# Patient Record
Sex: Male | Born: 1990 | Race: White | Hispanic: No | Marital: Single | Smoking: Current every day smoker
Health system: Southern US, Community
[De-identification: ages and names within clinical notes are randomized; demographics above are authoritative.]

## PROBLEM LIST (undated history)

## (undated) DIAGNOSIS — F1990 Other psychoactive substance use, unspecified, uncomplicated: Secondary | ICD-10-CM

## (undated) DIAGNOSIS — I82409 Acute embolism and thrombosis of unspecified deep veins of unspecified lower extremity: Secondary | ICD-10-CM

## (undated) HISTORY — DX: Acute embolism and thrombosis of unspecified deep veins of unspecified lower extremity: I82.409

---
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
## 2013-12-04 ENCOUNTER — Emergency Department: Payer: Self-pay | Admitting: Emergency Medicine

## 2014-02-23 ENCOUNTER — Emergency Department: Payer: Self-pay | Admitting: Emergency Medicine

## 2014-03-05 ENCOUNTER — Emergency Department: Payer: Self-pay | Admitting: Emergency Medicine

## 2014-04-19 ENCOUNTER — Encounter (HOSPITAL_COMMUNITY): Payer: Self-pay | Admitting: Emergency Medicine

## 2014-04-19 ENCOUNTER — Emergency Department (HOSPITAL_COMMUNITY)
Admission: EM | Admit: 2014-04-19 | Discharge: 2014-04-19 | Disposition: A | Discharge status: Home / Self Care | Payer: Self-pay | Attending: Emergency Medicine | Admitting: Emergency Medicine

## 2014-04-19 ENCOUNTER — Emergency Department (HOSPITAL_COMMUNITY): Payer: Self-pay

## 2014-04-19 DIAGNOSIS — W132XXA Fall from, out of or through roof, initial encounter: Secondary | ICD-10-CM | POA: Insufficient documentation

## 2014-04-19 DIAGNOSIS — Y93H3 Activity, building and construction: Secondary | ICD-10-CM | POA: Insufficient documentation

## 2014-04-19 DIAGNOSIS — Z880 Allergy status to penicillin: Secondary | ICD-10-CM | POA: Insufficient documentation

## 2014-04-19 DIAGNOSIS — S29002A Unspecified injury of muscle and tendon of back wall of thorax, initial encounter: Secondary | ICD-10-CM | POA: Insufficient documentation

## 2014-04-19 DIAGNOSIS — M549 Dorsalgia, unspecified: Secondary | ICD-10-CM

## 2014-04-19 DIAGNOSIS — Y9261 Building [any] under construction as the place of occurrence of the external cause: Secondary | ICD-10-CM | POA: Insufficient documentation

## 2014-04-19 DIAGNOSIS — Y990 Civilian activity done for income or pay: Secondary | ICD-10-CM | POA: Insufficient documentation

## 2014-04-19 MED ORDER — CYCLOBENZAPRINE HCL 10 MG PO TABS: 10.0000 mg | ORAL_TABLET | Freq: Two times a day (BID) | ORAL | Status: DC | PRN

## 2014-04-19 MED ORDER — DIAZEPAM 5 MG PO TABS
10.0000 mg | ORAL_TABLET | Freq: Once | ORAL | Status: AC
  Administered 2014-04-19: 10 mg via ORAL
  Filled 2014-04-19: qty 2

## 2014-04-19 MED ORDER — OXYCODONE-ACETAMINOPHEN 5-325 MG PO TABS
1.0000 | ORAL_TABLET | Freq: Once | ORAL | Status: AC
Start: 2014-04-19 — End: 2014-04-19
  Administered 2014-04-19: 1 via ORAL
  Filled 2014-04-19: qty 1

## 2014-04-19 MED ORDER — HYDROCODONE-ACETAMINOPHEN 5-325 MG PO TABS: 1.0000 | ORAL_TABLET | Freq: Four times a day (QID) | ORAL | Status: DC | PRN

## 2014-04-19 MED ORDER — NAPROXEN 500 MG PO TABS: 500.0000 mg | ORAL_TABLET | Freq: Two times a day (BID) | ORAL | Status: DC

## 2014-04-19 NOTE — Discharge Instructions (Signed)
Back Pain, Adult Low back pain is very common. About 1 in 5 people have back pain.The cause of low back pain is rarely dangerous. The pain often gets better over time.About half of people with a sudden onset of back pain feel better in just 2 weeks. About 8 in 10 people feel better by 6 weeks.  CAUSES Some common causes of back pain include:  Strain of the muscles or ligaments supporting the spine.  Wear and tear (degeneration) of the spinal discs.  Arthritis.  Direct injury to the back. DIAGNOSIS Most of the time, the direct cause of low back pain is not known.However, back pain can be treated effectively even when the exact cause of the pain is unknown.Answering your caregiver's questions about your overall health and symptoms is one of the most accurate ways to make sure the cause of your pain is not dangerous. If your caregiver needs more information, he or she may order lab work or imaging tests (X-rays or MRIs).However, even if imaging tests show changes in your back, this usually does not require surgery. HOME CARE INSTRUCTIONS For many people, back pain returns.Since low back pain is rarely dangerous, it is often a condition that people can learn to manageon their own.   Remain active. It is stressful on the back to sit or stand in one place. Do not sit, drive, or stand in one place for more than 30 minutes at a time. Take short walks on level surfaces as soon as pain allows.Try to increase the length of time you walk each day.  Do not stay in bed.Resting more than 1 or 2 days can delay your recovery.  Do not avoid exercise or work.Your body is made to move.It is not dangerous to be active, even though your back may hurt.Your back will likely heal faster if you return to being active before your pain is gone.  Pay attention to your body when you bend and lift. Many people have less discomfortwhen lifting if they bend their knees, keep the load close to their bodies,and  avoid twisting. Often, the most comfortable positions are those that put less stress on your recovering back.  Find a comfortable position to sleep. Use a firm mattress and lie on your side with your knees slightly bent. If you lie on your back, put a pillow under your knees.  Only take over-the-counter or prescription medicines as directed by your caregiver. Over-the-counter medicines to reduce pain and inflammation are often the most helpful.Your caregiver may prescribe muscle relaxant drugs.These medicines help dull your pain so you can more quickly return to your normal activities and healthy exercise.  Put ice on the injured area.  Put ice in a plastic bag.  Place a towel between your skin and the bag.  Leave the ice on for 15-20 minutes, 03-04 times a day for the first 2 to 3 days. After that, ice and heat may be alternated to reduce pain and spasms.  Ask your caregiver about trying back exercises and gentle massage. This may be of some benefit.  Avoid feeling anxious or stressed.Stress increases muscle tension and can worsen back pain.It is important to recognize when you are anxious or stressed and learn ways to manage it.Exercise is a great option. SEEK MEDICAL CARE IF:  You have pain that is not relieved with rest or medicine.  You have pain that does not improve in 1 week.  You have new symptoms.  You are generally not feeling well. SEEK   IMMEDIATE MEDICAL CARE IF:   You have pain that radiates from your back into your legs.  You develop new bowel or bladder control problems.  You have unusual weakness or numbness in your arms or legs.  You develop nausea or vomiting.  You develop abdominal pain.  You feel faint. Document Released: 06/16/2005 Document Revised: 12/16/2011 Document Reviewed: 10/18/2013 Exit[employer] Patient Information 2015 [employer]. This information is not intended to replace advice given to you by your health care provider. Make sure you  discuss any questions you have with your health care provider.  

## 2014-04-19 NOTE — ED Provider Notes (Signed)
CSN: [id number]     Arrival date & time 04/19/14  0021 History   First MD Initiated Contact with Patient 04/19/14 0204     Chief Complaint  Patient presents with  . Back Pain     (Consider location/radiation/quality/duration/timing/severity/associated sxs/prior Treatment) Patient is a 23 y.o. male presenting with back pain. The history is provided by the patient. No language interpreter was used.  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  No loss of consciousness. Did not strike any object during fall.  History reviewed. No pertinent past medical history. History reviewed. No pertinent past surgical history. No family history on file. History  Substance Use Topics  . Smoking status: Never Smoker   . Smokeless tobacco: Not on file  . Alcohol Use: Yes    Review of Systems  Gastrointestinal: Negative for bowel incontinence.  Genitourinary: Negative for bladder incontinence.  Musculoskeletal: Positive for back pain.  Neurological: Negative for numbness and paresthesias.  All other systems reviewed and are negative.     Allergies  Penicillins  Home Medications   Prior to Admission medications   Medication Sig Start Date End Date Taking? Authorizing Provider  Aspirin-Salicylamide-Caffeine (BC HEADACHE POWDER PO) Take 2 Packages by mouth daily as needed (pain).   Yes Historical Provider, MD  traMADol (ULTRAM) 50 MG tablet Take 150 mg by mouth once.   Yes Historical Provider, MD   BP 141/83  Pulse 92  Resp 16  Ht 6' (1.829 m)  Wt 175 lb (79.379 kg)  BMI 23.73  kg/m2  SpO2 100% Physical Exam  Nursing note and vitals reviewed. Constitutional: He is oriented to person, place, and time. He appears well-developed and well-nourished.  HENT:  Head: Normocephalic and atraumatic.  Eyes: Pupils are equal, round, and reactive to light.  Neck: Neck supple.  Cardiovascular: Normal rate, regular rhythm and intact distal pulses.   Pulmonary/Chest: Effort normal and breath sounds normal.  Abdominal: Soft.  Musculoskeletal: He exhibits tenderness.       Back:  Lymphadenopathy:    He has no cervical adenopathy.  Neurological: He is alert and oriented to person, place, and time.  Skin: Skin is warm and dry.  Psychiatric: He has a normal mood and affect.    ED Course  Procedures (including critical care time) Labs Review Labs Reviewed - No data to display  Imaging Review Dg Thoracic Spine W/swimmers  04/19/2014   CLINICAL DATA:  23 year old male status post fall from roof with acute back pain radiating to the lower extremities. Initial encounter.  EXAM: THORACIC SPINE - 2 VIEW + SWIMMERS  COMPARISON:  None.  FINDINGS: Bone mineralization is within normal limits. Dextro convex thoracic scoliosis. Thoracic segmentation appears normal. Motion artifact on the lateral view. Intermittent chronic degenerative endplate changes in the thoracic spine. Posterior ribs appear intact. Cervicothoracic junction alignment is within normal limits. Grossly negative visualized thoracic visceral contours.  IMPRESSION: Scoliosis. No acute fracture or listhesis identified in the thoracic spine.   Electronically Signed   By: Augusto GambleLee  Hall M.D.   On: 04/19/2014 01:52   Dg Lumbar Spine Complete  04/19/2014  CLINICAL DATA:  23 year old male status post fall from roof with acute back pain radiating to the lower extremities. Initial encounter.  EXAM: LUMBAR SPINE - COMPLETE 4+ VIEW  COMPARISON:  Thoracic spine series from the same day reported separately.  FINDINGS: Normal thoracic  segmentation depicted on comparison. Normal lumbar segmentation. Mild levoconvex lumbar scoliosis. Bone mineralization is within normal limits. Vertebral body height preserved. Disc spaces preserved. No pars fracture. Sacral ala and SI joints within normal limits. Visible lower thoracic levels appear intact.  IMPRESSION: Scoliosis. No acute fracture or listhesis identified in the lumbar spine.   Electronically Signed   By: Augusto GambleLee  Hall M.D.   On: 04/19/2014 01:53     EKG Interpretation None     Radiology results reviewed and shared with patient. No red flag symptoms. Muscle spasm and tenderness lower thoracic and upper lumbar areas.  Anti-inflammatory, muscle relaxant. Return precautions discussed. MDM   Final diagnoses:  Back pain        Jimmye Normanavid John Swayzie Choate, NP 04/19/14 [phone]

## 2014-04-19 NOTE — ED Notes (Addendum)
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  Pt denies loss of bowel or bladder, ambulatory in triage without difficulty.  Denies numbness in lower extremities.  Pt reports taking Tramadol without relief prior to arrival.

## 2014-04-19 NOTE — ED Notes (Signed)
Pt reporting pain to mid to lower back after slipping from a roof.  Pt sts harness jerked his body.  Tenderness palpated to thoracic and lumber spine.

## 2014-04-19 NOTE — ED Provider Notes (Signed)
Medical screening examination/treatment/procedure(s) were performed by non-physician practitioner and as supervising physician I was immediately available for consultation/collaboration.   EKG Interpretation None        Tomasita CrumbleAdeleke Jeorge Reister, MD 04/19/14 1534

## 2014-07-28 ENCOUNTER — Emergency Department: Payer: Self-pay | Admitting: Emergency Medicine

## 2014-10-30 ENCOUNTER — Encounter: Payer: Self-pay | Admitting: *Deleted

## 2014-10-30 ENCOUNTER — Emergency Department
Admission: EM | Admit: 2014-10-30 | Discharge: 2014-10-30 | Disposition: A | Discharge status: Home / Self Care | Payer: Self-pay | Attending: Emergency Medicine | Admitting: Emergency Medicine

## 2014-10-30 DIAGNOSIS — W450XXA Nail entering through skin, initial encounter: Secondary | ICD-10-CM | POA: Insufficient documentation

## 2014-10-30 DIAGNOSIS — Z791 Long term (current) use of non-steroidal anti-inflammatories (NSAID): Secondary | ICD-10-CM | POA: Insufficient documentation

## 2014-10-30 DIAGNOSIS — S91331A Puncture wound without foreign body, right foot, initial encounter: Secondary | ICD-10-CM | POA: Insufficient documentation

## 2014-10-30 DIAGNOSIS — Z880 Allergy status to penicillin: Secondary | ICD-10-CM | POA: Insufficient documentation

## 2014-10-30 DIAGNOSIS — Y9289 Other specified places as the place of occurrence of the external cause: Secondary | ICD-10-CM | POA: Insufficient documentation

## 2014-10-30 DIAGNOSIS — Z792 Long term (current) use of antibiotics: Secondary | ICD-10-CM | POA: Insufficient documentation

## 2014-10-30 DIAGNOSIS — Y9389 Activity, other specified: Secondary | ICD-10-CM | POA: Insufficient documentation

## 2014-10-30 DIAGNOSIS — Y990 Civilian activity done for income or pay: Secondary | ICD-10-CM | POA: Insufficient documentation

## 2014-10-30 DIAGNOSIS — Z79899 Other long term (current) drug therapy: Secondary | ICD-10-CM | POA: Insufficient documentation

## 2014-10-30 MED ORDER — OXYCODONE-ACETAMINOPHEN 5-325 MG PO TABS: 1.0000 | ORAL_TABLET | Freq: Four times a day (QID) | ORAL | Status: AC | PRN

## 2014-10-30 MED ORDER — CIPROFLOXACIN HCL 500 MG PO TABS
ORAL_TABLET | ORAL | Status: AC
  Filled 2014-10-30: qty 1

## 2014-10-30 MED ORDER — OXYCODONE-ACETAMINOPHEN 5-325 MG PO TABS
ORAL_TABLET | ORAL | Status: AC
  Filled 2014-10-30: qty 1

## 2014-10-30 MED ORDER — CIPROFLOXACIN HCL 500 MG PO TABS: 500.0000 mg | ORAL_TABLET | Freq: Two times a day (BID) | ORAL | Status: AC

## 2014-10-30 MED ORDER — CIPROFLOXACIN HCL 500 MG PO TABS
500.0000 mg | ORAL_TABLET | Freq: Once | ORAL | Status: AC
  Administered 2014-10-30: 500 mg via ORAL

## 2014-10-30 MED ORDER — OXYCODONE-ACETAMINOPHEN 5-325 MG PO TABS
1.0000 | ORAL_TABLET | Freq: Once | ORAL | Status: AC
  Administered 2014-10-30: 1 via ORAL

## 2014-10-30 NOTE — ED Notes (Signed)
Pt states he was at work today and stepped on a nail that went through his shoe on rt foot. Pt states he lost his balance and fell to the ground and also has back pain. Pt states he was soaking in epson salt at home but pain is severe and he had to come get it checked out

## 2014-10-30 NOTE — ED Notes (Signed)
Pt arrived to the ED for complaints of right foot pain and back pain. Pt states that today he stepped on a nail and it almost went all the way through; and when that happened he twisted his body and his back also started to hurt. Pt's sensation, circulation and range of motion are intact on affected limb. Skin around the laceration is red and whipping. Pt is AOx4 in mild pain distress.

## 2014-10-30 NOTE — ED Provider Notes (Signed)
[facility] Emergency Department Provider Note    ____________________________________________  Time seen: 2250 hrs.  I have reviewed the triage vital signs and the nursing notes.   HISTORY  Chief Complaint Foot Pain       HPI Nathaniel Lynn is a 24 y.o. male ) the right dorsal foot pain secondary to a nail punctured through his shoe while at work today. He states that he was [place] tears on when he stepped on a nail which went through his shoe patient stated pain to lose his balance and fall landing on his buttocks. Patient stated upon removing the nail there was moderate hemorrhaging which was controlled with direct pressure. Patient went home and said he soaked his foot in Epsom salt however the pain is increasingin swelling to his feet. Patient states secondary is atypical K his back pain when he fell has become worse. Patient describes pain as sharp and a 9/10. The patient stated he has problems weightbearing seems increases back pain. Patient denies any radicular pain from his back he also denies any bladder or bowel dysfunction.    History reviewed. No pertinent past medical history.  There are no active problems to display for this patient.   History reviewed. No pertinent past surgical history.  Current Outpatient Rx  Name  Route  Sig  Dispense  Refill  . Aspirin-Salicylamide-Caffeine (BC HEADACHE POWDER PO)   Oral   Take 2 Packages by mouth daily as needed (pain).         . ciprofloxacin (CIPRO) 500 MG tablet   Oral   Take 1 tablet (500 mg total) by mouth 2 (two) times daily.   20 tablet   0   . cyclobenzaprine (FLEXERIL) 10 MG tablet   Oral   Take 1 tablet (10 mg total) by mouth 2 (two) times daily as needed for muscle spasms.   20 tablet   0   . HYDROcodone-acetaminophen (NORCO/VICODIN) 5-325 MG per tablet   Oral   Take 1 tablet by mouth every 6 (six) hours as needed.   10 tablet   0   . naproxen (NAPROSYN) 500 MG  tablet   Oral   Take 1 tablet (500 mg total) by mouth 2 (two) times daily.   20 tablet   0   . oxyCODONE-acetaminophen (ROXICET) 5-325 MG per tablet   Oral   Take 1 tablet by mouth every 6 (six) hours as needed for moderate pain.   12 tablet   0   . traMADol (ULTRAM) 50 MG tablet   Oral   Take 150 mg by mouth once.           Allergies Penicillins  No family history on file.  Social History History  Substance Use Topics  . Smoking status: Never Smoker   . Smokeless tobacco: Not on file  . Alcohol Use: Yes    Review of Systems  Constitutional: Negative for fever. Eyes: Negative for visual changes. ENT: Negative for sore throat. Cardiovascular: Negative for chest pain. Respiratory: Negative for shortness of breath. Gastrointestinal: Negative for abdominal pain, vomiting and diarrhea. Genitourinary: Negative for dysuria. Musculoskeletal: Positive for back pain. Right plantar pain. Skin: Negative for rash. Neurological: Negative for headaches, focal weakness or numbness. Psychiatric:Negative Endocrine:Negative Hematological/Lymphatic:Negative Allergic/Immunilogical: Negative  10-point ROS otherwise negative.  ____________________________________________   PHYSICAL EXAM:  VITAL SIGNS: ED Triage Vitals  Enc Vitals Group     BP 10/30/14 2106 139/70 mmHg     Pulse Rate 10/30/14 2106  70     Resp 10/30/14 2106 16     Temp 10/30/14 2106 98.9 F (37.2 C)     Temp Source 10/30/14 2106 Oral     SpO2 10/30/14 2106 99 %     Weight 10/30/14 2106 175 lb (79.379 kg)     Height 10/30/14 2106 6' (1.829 m)     Head Cir --      Peak Flow --      Pain Score 10/30/14 2107 9     Pain Loc --      Pain Edu? --      Excl. in GC? --      Constitutional: Alert and oriented. Well appearing and in no distress. Eyes: Conjunctivae are normal. PERRL. Normal extraocular movements. ENT   Head: Normocephalic and atraumatic.   Nose: No congestion/rhinnorhea.    Mouth/Throat: Mucous membranes are moist.   Neck: No stridor. Hematological/Lymphatic/Immunilogical: No cervical lymphadenopathy. Cardiovascular: Normal rate, regular rhythm. Normal and symmetric distal pulses are present in all extremities. No murmurs, rubs, or gallops. Respiratory: Normal respiratory effort without tachypnea nor retractions. Breath sounds are clear and equal bilaterally. No wheezes/rales/rhonchi. Gastrointestinal: Soft and nontender. No distention. No abdominal bruits. There is no CVA tenderness. Genitourinary: Not examined Musculoskeletal: No spinal deformity.  . No joint effusions.  Edema is noted to thedorsal  aspect of the right foot  Neurologic:  Normal speech and language. No gross focal neurologic deficits are appreciated. Speech is normal. No gait instability. Skin:  Skin is warm, dry and small puncture wound to the plantar aspect of the right foot No rash noted. Psychiatric: Mood and affect are normal. Speech and behavior are normal. Patient exhibits appropriate insight and judgment.  ____________________________________________    LABS (pertinent positives/negatives)  None  ____________________________________________   EKG  None  ____________________________________________    RADIOLOGY  None  ____________________________________________   PROCEDURES  Procedure(s) performed: None  Critical Care performed: No  ____________________________________________   INITIAL IMPRESSION / ASSESSMENT AND PLAN / ED COURSE  Pertinent labs & imaging results that were available during my care of the patient were reviewed by me and considered in my medical decision making (see chart for details).  Puncture wound right foot. Back pain  ____________________________________________   FINAL CLINICAL IMPRESSION(S) / ED DIAGNOSES  Final diagnoses:  Puncture wound of foot, right, initial encounter     Joni ReiningRonald K Smith, PA-C 10/30/14 2318  Loleta Roseory  Forbach, MD 10/31/14 [phone]

## 2014-12-11 ENCOUNTER — Emergency Department: Payer: Self-pay

## 2014-12-11 ENCOUNTER — Emergency Department
Admission: EM | Admit: 2014-12-11 | Discharge: 2014-12-11 | Disposition: A | Discharge status: Home / Self Care | Payer: Self-pay | Attending: Emergency Medicine | Admitting: Emergency Medicine

## 2014-12-11 ENCOUNTER — Encounter: Payer: Self-pay | Admitting: Emergency Medicine

## 2014-12-11 DIAGNOSIS — T148 Other injury of unspecified body region: Secondary | ICD-10-CM | POA: Insufficient documentation

## 2014-12-11 DIAGNOSIS — S6991XA Unspecified injury of right wrist, hand and finger(s), initial encounter: Secondary | ICD-10-CM | POA: Insufficient documentation

## 2014-12-11 DIAGNOSIS — Z791 Long term (current) use of non-steroidal anti-inflammatories (NSAID): Secondary | ICD-10-CM | POA: Insufficient documentation

## 2014-12-11 DIAGNOSIS — M79641 Pain in right hand: Secondary | ICD-10-CM

## 2014-12-11 DIAGNOSIS — Y9289 Other specified places as the place of occurrence of the external cause: Secondary | ICD-10-CM | POA: Insufficient documentation

## 2014-12-11 DIAGNOSIS — T148XXA Other injury of unspecified body region, initial encounter: Secondary | ICD-10-CM

## 2014-12-11 DIAGNOSIS — Z880 Allergy status to penicillin: Secondary | ICD-10-CM | POA: Insufficient documentation

## 2014-12-11 DIAGNOSIS — S6992XA Unspecified injury of left wrist, hand and finger(s), initial encounter: Secondary | ICD-10-CM | POA: Insufficient documentation

## 2014-12-11 DIAGNOSIS — Y998 Other external cause status: Secondary | ICD-10-CM | POA: Insufficient documentation

## 2014-12-11 DIAGNOSIS — Y9389 Activity, other specified: Secondary | ICD-10-CM | POA: Insufficient documentation

## 2014-12-11 DIAGNOSIS — Z720 Tobacco use: Secondary | ICD-10-CM | POA: Insufficient documentation

## 2014-12-11 DIAGNOSIS — W2209XA Striking against other stationary object, initial encounter: Secondary | ICD-10-CM | POA: Insufficient documentation

## 2014-12-11 MED ORDER — OXYCODONE-ACETAMINOPHEN 5-325 MG PO TABS
ORAL_TABLET | ORAL | Status: AC
  Administered 2014-12-11: 1 via ORAL
  Filled 2014-12-11: qty 1

## 2014-12-11 MED ORDER — OXYCODONE-ACETAMINOPHEN 5-325 MG PO TABS
1.0000 | ORAL_TABLET | Freq: Once | ORAL | Status: AC
  Administered 2014-12-11: 1 via ORAL

## 2014-12-11 MED ORDER — OXYCODONE-ACETAMINOPHEN 5-325 MG PO TABS: 1.0000 | ORAL_TABLET | Freq: Four times a day (QID) | ORAL | Status: AC | PRN

## 2014-12-11 NOTE — ED Notes (Signed)
Ice pack supplied to pt

## 2014-12-11 NOTE — ED Provider Notes (Signed)
[facility] Emergency Department Provider Note  ____________________________________________  Time seen: Approximately  536 AM  I have reviewed the triage vital signs and the nursing notes.   HISTORY  Chief Complaint Hand Pain    HPI JUANANDRES PAYANT is a 24 y.o. male who comes in with right hand pain after punching a wall earlier today. The patient reports that the pain is in his right hand worse in his last 3 knuckles. He reports that he punched a wall at approximately 3:30. The patient reports this pain as a 9 out of 10 in intensity. He reports is worse when he moves it and better when he keeps it still. The patient did place some ice on his wrist and it did help the swelling. He also took some tramadol and BC powder but it did not help with the pain. The patient is here as he is having some significant pain and wanted to evaluate for possible fracture.   History reviewed. No pertinent past medical history.  There are no active problems to display for this patient.   History reviewed. No pertinent past surgical history.  Current Outpatient Rx  Name  Route  Sig  Dispense  Refill  . Aspirin-Salicylamide-Caffeine (BC HEADACHE POWDER PO)   Oral   Take 2 Packages by mouth daily as needed (pain).         . cyclobenzaprine (FLEXERIL) 10 MG tablet   Oral   Take 1 tablet (10 mg total) by mouth 2 (two) times daily as needed for muscle spasms.   20 tablet   0   . HYDROcodone-acetaminophen (NORCO/VICODIN) 5-325 MG per tablet   Oral   Take 1 tablet by mouth every 6 (six) hours as needed.   10 tablet   0   . naproxen (NAPROSYN) 500 MG tablet   Oral   Take 1 tablet (500 mg total) by mouth 2 (two) times daily.   20 tablet   0   . oxyCODONE-acetaminophen (ROXICET) 5-325 MG per tablet   Oral   Take 1 tablet by mouth every 6 (six) hours as needed.   12 tablet   0   . traMADol (ULTRAM) 50 MG tablet   Oral   Take 150 mg by mouth once.            Allergies Penicillins  History reviewed. No pertinent family history.  Social History History  Substance Use Topics  . Smoking status: Current Every Day Smoker  . Smokeless tobacco: Not on file  . Alcohol Use: Yes    Review of Systems Constitutional: No fever/chills Eyes: No visual changes. ENT: No sore throat. Cardiovascular: Denies chest pain. Respiratory: Denies shortness of breath. Gastrointestinal: No abdominal pain.  No nausea, no vomiting.   Genitourinary: Negative for dysuria. Musculoskeletal: Left hand pain. Negative for back pain. Skin: Negative for rash. Neurological: Negative for headaches,   10-point ROS otherwise negative.  ____________________________________________   PHYSICAL EXAM:  VITAL SIGNS: ED Triage Vitals  Enc Vitals Group     BP 12/11/14 0335 112/72 mmHg     Pulse Rate 12/11/14 0335 85     Resp 12/11/14 0335 20     Temp 12/11/14 0335 97.5 F (36.4 C)     Temp Source 12/11/14 0335 Oral     SpO2 12/11/14 0335 98 %     Weight 12/11/14 0335 175 lb (79.379 kg)     Height 12/11/14 0335 6' (1.829 m)     Head Cir --  Peak Flow --      Pain Score 12/11/14 0335 9     Pain Loc --      Pain Edu? --      Excl. in GC? --     Constitutional: Alert and oriented. Well appearing and in moderate distress. Eyes: Conjunctivae are normal. PERRL. EOMI. Head: Atraumatic. Nose: No congestion/rhinnorhea. Mouth/Throat: Mucous membranes are moist.  Oropharynx non-erythematous. Cardiovascular: Normal rate, regular rhythm. Grossly normal heart sounds.  Good peripheral circulation. Respiratory: Normal respiratory effort.  No retractions. Lungs CTAB. Gastrointestinal: Soft and nontender. No distention. Positive bowel sounds Genitourinary: Deferred Musculoskeletal: No lower extremity tenderness nor edema.  No joint effusions. Neurologic:  Normal speech and language. No gross focal neurologic deficits are appreciated.  Skin:  Skin is warm, dry and  intact. No rash noted. Psychiatric: Mood and affect are normal.   ____________________________________________   LABS (all labs ordered are listed, but only abnormal results are displayed)  Labs Reviewed - No data to display ____________________________________________  EKG  None ____________________________________________  RADIOLOGY  Right hand x-ray: Soft tissue swelling, no acute bony abnormalities ____________________________________________   PROCEDURES  Procedure(s) performed: None  Critical Care performed: No  ____________________________________________   INITIAL IMPRESSION / ASSESSMENT AND PLAN / ED COURSE  Pertinent labs & imaging results that were available during my care of the patient were reviewed by me and considered in my medical decision making (see chart for details).  The patient is a 24 year old male who comes in today with some right hand pain after punching a wall. The patient does not have any acute fractures according to x-ray but I was still put the patient in an ulnar gutter splint for comfort give him a dose of Percocet and discharged him home to follow-up with orthopedic surgery.  SPLINT APPLICATION Date/Time: 6:19 AM Authorized by: Lucrezia Europe P Consent: Verbal consent obtained. Risks and benefits: risks, benefits and alternatives were discussed Consent given by: patient Splint applied by: EDtechnician Location details: Right hand  Splint type: Ulnar gutter  Supplies used: Upper glass  Post-procedure: The splinted body part was neurovascularly unchanged following the procedure. Patient tolerance: Patient tolerated the procedure well with no immediate complications.    ____________________________________________   FINAL CLINICAL IMPRESSION(S) / ED DIAGNOSES  Final diagnoses:  Hand pain, left  Abrasion  Contusion      Rebecka Apley, MD 12/11/14 [phone]

## 2014-12-11 NOTE — ED Notes (Signed)
Pt states he punched a wall with right hand. Swelling noted to knuckles, slight abrasion.

## 2014-12-11 NOTE — Discharge Instructions (Signed)
Abrasions An abrasion is a cut or scrape of the skin. Abrasions do not go through all layers of the skin. HOME CARE  If a bandage (dressing) was put on your wound, change it as told by your doctor. If the bandage sticks, soak it off with warm.  Wash the area with water and soap 2 times a day. Rinse off the soap. Pat the area dry with a clean towel.  Put on medicated cream (ointment) as told by your doctor.  Change your bandage right away if it gets wet or dirty.  Only take medicine as told by your doctor.  See your doctor within 24-48 hours to get your wound checked.  Check your wound for redness, puffiness (swelling), or yellowish-white fluid (pus). GET HELP RIGHT AWAY IF:   You have more pain in the wound.  You have redness, swelling, or tenderness around the wound.  You have pus coming from the wound.  You have a fever or lasting symptoms for more than 2-3 days.  You have a fever and your symptoms suddenly get worse.  You have a bad smell coming from the wound or bandage. MAKE SURE YOU:   Understand these instructions.  Will watch your condition.  Will get help right away if you are not doing well or get worse. Document Released: 12/03/2007 Document Revised: 03/10/2012 Document Reviewed: 05/20/2011 [facility] Patient Information 2015 [place], [place]. This information is not intended to replace advice given to you by your health care provider. Make sure you discuss any questions you have with your health care provider.  Contusion A contusion is a deep bruise. Contusions are the result of an injury that caused bleeding under the skin. The contusion may turn blue, purple, or yellow. Minor injuries will give you a painless contusion, but more severe contusions may stay painful and swollen for a few weeks.  CAUSES  A contusion is usually caused by a blow, trauma, or direct force to an area of the body. SYMPTOMS   Swelling and redness of the injured area.  Bruising of the  injured area.  Tenderness and soreness of the injured area.  Pain. DIAGNOSIS  The diagnosis can be made by taking a history and physical exam. An X-ray, CT scan, or MRI may be needed to determine if there were any associated injuries, such as fractures. TREATMENT  Specific treatment will depend on what area of the body was injured. In general, the best treatment for a contusion is resting, icing, elevating, and applying cold compresses to the injured area. Over-the-counter medicines may also be recommended for pain control. Ask your caregiver what the best treatment is for your contusion. HOME CARE INSTRUCTIONS   Put ice on the injured area.  Put ice in a plastic bag.  Place a towel between your skin and the bag.  Leave the ice on for 15-20 minutes, 3-4 times a day, or as directed by your health care provider.  Only take over-the-counter or prescription medicines for pain, discomfort, or fever as directed by your caregiver. Your caregiver may recommend avoiding anti-inflammatory medicines (aspirin, ibuprofen, and naproxen) for 48 hours because these medicines may increase bruising.  Rest the injured area.  If possible, elevate the injured area to reduce swelling. SEEK IMMEDIATE MEDICAL CARE IF:   You have increased bruising or swelling.  You have pain that is getting worse.  Your swelling or pain is not relieved with medicines. MAKE SURE YOU:   Understand these instructions.  Will watch your condition.  Will get help right away if you are not doing well or get worse. Document Released: 03/26/2005 Document Revised: 06/21/2013 Document Reviewed: 04/21/2011 [facility] Patient Information 2015 [place], [place]. This information is not intended to replace advice given to you by your health care provider. Make sure you discuss any questions you have with your health care provider.  Musculoskeletal Pain Musculoskeletal pain is muscle and boney aches and pains. These pains can occur in  any part of the body. Your caregiver may treat you without knowing the cause of the pain. They may treat you if blood or urine tests, X-rays, and other tests were normal.  CAUSES There is often not a definite cause or reason for these pains. These pains may be caused by a type of germ (virus). The discomfort may also come from overuse. Overuse includes working out too hard when your body is not fit. Boney aches also come from weather changes. Bone is sensitive to atmospheric pressure changes. HOME CARE INSTRUCTIONS   Ask when your test results will be ready. Make sure you get your test results.  Only take over-the-counter or prescription medicines for pain, discomfort, or fever as directed by your caregiver. If you were given medications for your condition, do not drive, operate machinery or power tools, or sign legal documents for 24 hours. Do not drink alcohol. Do not take sleeping pills or other medications that may interfere with treatment.  Continue all activities unless the activities cause more pain. When the pain lessens, slowly resume normal activities. Gradually increase the intensity and duration of the activities or exercise.  During periods of severe pain, bed rest may be helpful. Lay or sit in any position that is comfortable.  Putting ice on the injured area.  Put ice in a bag.  Place a towel between your skin and the bag.  Leave the ice on for 15 to 20 minutes, 3 to 4 times a day.  Follow up with your caregiver for continued problems and no reason can be found for the pain. If the pain becomes worse or does not go away, it may be necessary to repeat tests or do additional testing. Your caregiver may need to look further for a possible cause. SEEK IMMEDIATE MEDICAL CARE IF:  You have pain that is getting worse and is not relieved by medications.  You develop chest pain that is associated with shortness or breath, sweating, feeling sick to your stomach (nauseous), or throw up  (vomit).  Your pain becomes localized to the abdomen.  You develop any new symptoms that seem different or that concern you. MAKE SURE YOU:   Understand these instructions.  Will watch your condition.  Will get help right away if you are not doing well or get worse. Document Released: 06/16/2005 Document Revised: 09/08/2011 Document Reviewed: 02/18/2013 [facility] Patient Information 2015 [place], [place]. This information is not intended to replace advice given to you by your health care provider. Make sure you discuss any questions you have with your health care provider.

## 2015-05-01 ENCOUNTER — Emergency Department
Admission: EM | Admit: 2015-05-01 | Discharge: 2015-05-01 | Disposition: A | Discharge status: Home / Self Care | Payer: Self-pay | Attending: Emergency Medicine | Admitting: Emergency Medicine

## 2015-05-01 ENCOUNTER — Emergency Department: Payer: Self-pay

## 2015-05-01 ENCOUNTER — Encounter: Payer: Self-pay | Admitting: Emergency Medicine

## 2015-05-01 DIAGNOSIS — Y998 Other external cause status: Secondary | ICD-10-CM | POA: Insufficient documentation

## 2015-05-01 DIAGNOSIS — Y9389 Activity, other specified: Secondary | ICD-10-CM | POA: Insufficient documentation

## 2015-05-01 DIAGNOSIS — Z880 Allergy status to penicillin: Secondary | ICD-10-CM | POA: Insufficient documentation

## 2015-05-01 DIAGNOSIS — Y9289 Other specified places as the place of occurrence of the external cause: Secondary | ICD-10-CM | POA: Insufficient documentation

## 2015-05-01 DIAGNOSIS — Z791 Long term (current) use of non-steroidal anti-inflammatories (NSAID): Secondary | ICD-10-CM | POA: Insufficient documentation

## 2015-05-01 DIAGNOSIS — Z79899 Other long term (current) drug therapy: Secondary | ICD-10-CM | POA: Insufficient documentation

## 2015-05-01 DIAGNOSIS — S025XXA Fracture of tooth (traumatic), initial encounter for closed fracture: Secondary | ICD-10-CM | POA: Insufficient documentation

## 2015-05-01 DIAGNOSIS — Z720 Tobacco use: Secondary | ICD-10-CM | POA: Insufficient documentation

## 2015-05-01 DIAGNOSIS — W01198A Fall on same level from slipping, tripping and stumbling with subsequent striking against other object, initial encounter: Secondary | ICD-10-CM | POA: Insufficient documentation

## 2015-05-01 DIAGNOSIS — S20221A Contusion of right back wall of thorax, initial encounter: Secondary | ICD-10-CM | POA: Insufficient documentation

## 2015-05-01 LAB — URINALYSIS COMPLETE WITH MICROSCOPIC (ARMC ONLY)
BILIRUBIN URINE: NEGATIVE
Glucose, UA: NEGATIVE mg/dL
HGB URINE DIPSTICK: NEGATIVE
KETONES UR: NEGATIVE mg/dL
LEUKOCYTES UA: NEGATIVE
NITRITE: NEGATIVE
PROTEIN: NEGATIVE mg/dL
RBC / HPF: NONE SEEN RBC/hpf (ref 0–5)
SPECIFIC GRAVITY, URINE: 1.017 (ref 1.005–1.030)
WBC UA: NONE SEEN WBC/hpf (ref 0–5)
pH: 6 (ref 5.0–8.0)

## 2015-05-01 MED ORDER — CLINDAMYCIN HCL 150 MG PO CAPS
450.0000 mg | ORAL_CAPSULE | Freq: Once | ORAL | Status: AC
  Administered 2015-05-01: 450 mg via ORAL
  Filled 2015-05-01: qty 3

## 2015-05-01 MED ORDER — CLINDAMYCIN HCL 150 MG PO CAPS: 450.0000 mg | ORAL_CAPSULE | Freq: Three times a day (TID) | ORAL | Status: AC

## 2015-05-01 MED ORDER — IBUPROFEN 800 MG PO TABS
800.0000 mg | ORAL_TABLET | Freq: Once | ORAL | Status: AC
  Administered 2015-05-01: 800 mg via ORAL
  Filled 2015-05-01: qty 1

## 2015-05-01 NOTE — ED Notes (Signed)
Pt ambulated to treatment room with steady gait. Pt presents to ED with c/o lower back pain since 2 pm today.Pt reports he was carrying a stack of shingles on his back and when he bent over to put them down his foot slipped and he fell onto the roof with the bundles of shingles landing on top of his back. Pt reports pain increases when ambulated. Pt alert and oriented x 4, respirations even and unlabored. NAD noted at this time.

## 2015-05-01 NOTE — ED Notes (Signed)

## 2015-05-01 NOTE — Discharge Instructions (Signed)
Tooth Injuries °Tooth injuries (tooth trauma) include cracked or broken teeth (fractures), teeth that have been moved out of place or dislodged (luxations), and knocked-out teeth (avulsions). °A tooth injury often needs to be treated quickly to save the tooth. However, sometimes it is not possible to save a tooth after an injury, so the tooth may need to be removed (extracted). °CAUSES °Tooth injuries may be caused by any force that is strong enough to chip, break, dislodge, or knock out a tooth. Forces may be due to: °· Sports injuries. °· Falls. °· Accidents. °· Fights. °RISK FACTORS °You may be more likely to injure a tooth if you play a contact sport without using a mouthguard. °SYMPTOMS °A tooth that is forced into the gum may appear dislodged or moved out of position into the tooth socket. A fractured tooth may not be as obvious. Symptoms of a tooth injury include: °· Pain, especially with chewing. °· A loose tooth. °· Bleeding in or around the tooth. °· Swelling or bruising near the tooth. °· Swelling or bruising of the lip over the injured tooth. °· Increased sensitivity to heat and cold. °DIAGNOSIS °A tooth injury can be diagnosed with a medical history and a physical exam. You may also need dental X-rays to check for injuries to the root of the tooth. °TREATMENT °Treatment depends on the type of injury you have and how bad it is. Treatment may need to be done quickly to save your tooth. Possible treatments include: °· Replacing a tooth fragment with a filling, cap, or hard, protective cover (crown). This may be an option for a chip or fracture that does not involve the inside of your tooth (pulp). °· Having a procedure to repair the inside of the tooth (root canal) and then having a crown placed on top. This may be done to treat a tooth fracture that involves the pulp. °· Repositioning a dislodged tooth, then doing a root canal. The root canal usually needs to be done within a few days of the  injury. °· Replacing a knocked-out tooth in the socket, if possible, then doing a root canal a few weeks later. °· Tooth extraction for a fracture that extends below your gumline or splits your tooth completely. °HOME CARE INSTRUCTIONS °· Take medicines only as directed by your dental provider or health care provider. °· Keep all follow-up visits as directed by your dental provider or health care provider. This is important. °· Do not eat or chew on very hard objects. These include ice cubes, pens, pencils, hard candy, and popcorn kernels. °· Do not clench or grind your teeth. Tell your dental provider or health care provider if you grind your teeth while you sleep. °· Apply ice to your mouth near the injured tooth as directed by your dental provider or health care provider. °· Follow instructions about rinsing your mouth with salt water as directed by your dental provider or health care provider. °· Do not use your teeth to open packages. °· Always wear mouth protection when you play contact sports. °SEEK MEDICAL CARE IF: °· You continue to have tooth pain after a tooth injury. °· Your tooth is sensitive to heat and cold. °· You develop swelling near your injured tooth. °· You have a fever. °· You are unable to open your jaw. °· You are drooling and it is getting worse. °  °This information is not intended to replace advice given to you by your health care provider. Make sure you discuss any   questions you have with your health care provider. °  °Document Released: 03/13/2004 Document Revised: 10/31/2014 Document Reviewed: 06/12/2014 °Elsevier Interactive Patient Education ©2016 [employer] ° °

## 2015-05-01 NOTE — ED Provider Notes (Signed)
[facility] Emergency Department Provider Note  ____________________________________________  Time seen: Approximately 810 PM  I have reviewed the triage vital signs and the nursing notes.   HISTORY  Chief Complaint Back Pain    HPI Nathaniel Lynn is a 24 y.o. male without any chronic medical issues who is presenting today with back pain after dropping a heavy load of shingles on his back. He said that he was carrying about 150 pounds worth of shingles on his back while walking up a peak roof when he slipped and fell onto his belly. He says when the shingles hit his back he began to have a lot of pain. He has not had any loss of urine or bowel continence since the event happened at 2 PM this afternoon. However he is complaining of pain to his mid thoracic spine as well as to the left side of his thorax. He denies any difficulty breathing. Denies any weakness or numbness to his lower extremities. He did not hit his head or lose consciousness.The pain is increased with walking or movement of the back.  Also complaining of pain and pus drainage from his lower most posterior molar. He says he has not seen a dentist since he has been about 24 years old.   History reviewed. No pertinent past medical history.  There are no active problems to display for this patient.   History reviewed. No pertinent past surgical history.  Current Outpatient Rx  Name  Route  Sig  Dispense  Refill  . Aspirin-Salicylamide-Caffeine (BC HEADACHE POWDER PO)   Oral   Take 2 Packages by mouth daily as needed (pain).         . cyclobenzaprine (FLEXERIL) 10 MG tablet   Oral   Take 1 tablet (10 mg total) by mouth 2 (two) times daily as needed for muscle spasms.   20 tablet   0   . HYDROcodone-acetaminophen (NORCO/VICODIN) 5-325 MG per tablet   Oral   Take 1 tablet by mouth every 6 (six) hours as needed.   10 tablet   0   . naproxen (NAPROSYN) 500 MG tablet   Oral   Take 1  tablet (500 mg total) by mouth 2 (two) times daily.   20 tablet   0   . oxyCODONE-acetaminophen (ROXICET) 5-325 MG per tablet   Oral   Take 1 tablet by mouth every 6 (six) hours as needed.   12 tablet   0   . traMADol (ULTRAM) 50 MG tablet   Oral   Take 150 mg by mouth once.           Allergies Penicillins  No family history on file.  Social History Social History  Substance Use Topics  . Smoking status: Current Every Day Smoker -- 0.50 packs/day    Types: Cigarettes  . Smokeless tobacco: None  . Alcohol Use: Yes    Review of Systems Constitutional: No fever/chills Eyes: No visual changes. ENT: No sore throat. Cardiovascular: Denies chest pain. Respiratory: Denies shortness of breath. Gastrointestinal: No abdominal pain.  No nausea, no vomiting.  No diarrhea.  No constipation. Genitourinary: Negative for dysuria. Musculoskeletal: As above Skin: Negative for rash. Neurological: Negative for headaches, focal weakness or numbness.  10-point ROS otherwise negative.  ____________________________________________   PHYSICAL EXAM:  VITAL SIGNS: ED Triage Vitals  Enc Vitals Group     BP 05/01/15 1951 145/70 mmHg     Pulse Rate 05/01/15 1951 100     Resp 05/01/15  1951 18     Temp 05/01/15 1951 98 F (36.7 C)     Temp Source 05/01/15 1951 Oral     SpO2 05/01/15 1951 100 %     Weight 05/01/15 1951 175 lb (79.379 kg)     Height 05/01/15 1951 6' (1.829 m)     Head Cir --      Peak Flow --      Pain Score 05/01/15 1951 7     Pain Loc --      Pain Edu? --      Excl. in GC? --     Constitutional: Alert and oriented. Well appearing and in no acute distress. Eyes: Conjunctivae are normal. PERRL. EOMI. Head: Atraumatic. Nose: No congestion/rhinnorhea. Mouth/Throat: Mucous membranes are moist.  Oropharynx non-erythematous. Tooth #32 with a fracture. No surrounding fluctuance or pus drainage. Neck: No stridor.   Cardiovascular: Normal rate, regular rhythm.  Grossly normal heart sounds.  Good peripheral circulation. Respiratory: Normal respiratory effort.  No retractions. Lungs CTAB. Gastrointestinal: Soft and nontender. No distention. No abdominal bruits. No CVA tenderness. Musculoskeletal: No lower extremity tenderness nor edema.  No joint effusions. Patient with tenderness to T9-11 at the midline. There is no deformity, step-off or ecchymosis. He also has left CVA tenderness without any crepitus, deformity or ecchymosis overlying. He has 5 out of 5 strength to the bilateral lower extremities. He did not have any saddle anesthesia. He was able to stand on his tiptoes with full strength and without any issue. Neurologic:  Normal speech and language. No gross focal neurologic deficits are appreciated. No gait instability. Skin:  Skin is warm, dry and intact. No rash noted. Psychiatric: Mood and affect are normal. Speech and behavior are normal.  ____________________________________________   LABS (all labs ordered are listed, but only abnormal results are displayed)  Labs Reviewed  URINALYSIS COMPLETEWITH MICROSCOPIC ([facility] ONLY) - Abnormal; Notable for the following:    Color, Urine YELLOW (*)    APPearance CLEAR (*)    Bacteria, UA RARE (*)    Squamous Epithelial / LPF 0-5 (*)    All other components within normal limits   ____________________________________________  EKG   ____________________________________________  RADIOLOGY  No fracture or spondylolisthesis. Slight scoliosis. ____________________________________________   PROCEDURES   ____________________________________________   INITIAL IMPRESSION / ASSESSMENT AND PLAN / ED COURSE  Pertinent labs & imaging results that were available during my care of the patient were reviewed by me and considered in my medical decision making (see chart for details).  ----------------------------------------- 9:13 PM on 05/01/2015 -----------------------------------------  No  blood found in the urine and there is a reassuring spinal x-ray. Likely the patient has a contusion that he sustained during this fall this afternoon. I reviewed with him his labs as well as imaging results. He will use ice and ibuprofen and will also try icy hot or Aspercreme to the area for the contusion. Unfortunately the patient does have a allergy to penicillin and does not have insurance. I will give him a prescription for clindamycin as well as a in care Rx card and a coupon. I also gave him the list of dental clinics where he may call for follow-up appointments. He'll be discharged to home. ____________________________________________   FINAL CLINICAL IMPRESSION(S) / ED DIAGNOSES  Thoracic contusion. Dental fracture.    Myrna Blazer, MD 05/01/15 2114

## 2015-05-01 NOTE — ED Notes (Addendum)
Pt presents to ED with lower back pain. Pt states he was carrying shingles on a roof while at work. Pt reports when he bent over to put them down his foot slipped out from under him and he fell onto the roof with the bundles of shingles landing on top of his back. Pain increases with walking. Hx of back pain. Pt states he also has an abscessed tooth he would like to have looked at while he is here.

## 2016-03-23 ENCOUNTER — Emergency Department: Payer: Self-pay

## 2016-03-23 ENCOUNTER — Encounter: Payer: Self-pay | Admitting: Emergency Medicine

## 2016-03-23 ENCOUNTER — Emergency Department
Admission: EM | Admit: 2016-03-23 | Discharge: 2016-03-23 | Disposition: A | Discharge status: Home / Self Care | Payer: Self-pay | Attending: Emergency Medicine | Admitting: Emergency Medicine

## 2016-03-23 DIAGNOSIS — F17210 Nicotine dependence, cigarettes, uncomplicated: Secondary | ICD-10-CM | POA: Insufficient documentation

## 2016-03-23 DIAGNOSIS — S20211A Contusion of right front wall of thorax, initial encounter: Secondary | ICD-10-CM | POA: Insufficient documentation

## 2016-03-23 DIAGNOSIS — Y999 Unspecified external cause status: Secondary | ICD-10-CM | POA: Insufficient documentation

## 2016-03-23 DIAGNOSIS — S60222A Contusion of left hand, initial encounter: Secondary | ICD-10-CM | POA: Insufficient documentation

## 2016-03-23 DIAGNOSIS — Y9372 Activity, wrestling: Secondary | ICD-10-CM | POA: Insufficient documentation

## 2016-03-23 DIAGNOSIS — Y9289 Other specified places as the place of occurrence of the external cause: Secondary | ICD-10-CM | POA: Insufficient documentation

## 2016-03-23 MED ORDER — MELOXICAM 15 MG PO TABS: 15.0000 mg | ORAL_TABLET | Freq: Every day | ORAL | 0 refills | Status: DC
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
## 2017-10-04 ENCOUNTER — Encounter: Payer: Self-pay | Admitting: Emergency Medicine

## 2017-10-04 ENCOUNTER — Emergency Department
Admission: EM | Admit: 2017-10-04 | Discharge: 2017-10-04 | Disposition: A | Discharge status: Home / Self Care | Payer: Self-pay | Attending: Emergency Medicine | Admitting: Emergency Medicine

## 2017-10-04 DIAGNOSIS — R300 Dysuria: Secondary | ICD-10-CM | POA: Insufficient documentation

## 2017-10-04 DIAGNOSIS — F17210 Nicotine dependence, cigarettes, uncomplicated: Secondary | ICD-10-CM | POA: Insufficient documentation

## 2017-10-04 DIAGNOSIS — Z79899 Other long term (current) drug therapy: Secondary | ICD-10-CM | POA: Insufficient documentation

## 2017-10-04 DIAGNOSIS — A64 Unspecified sexually transmitted disease: Secondary | ICD-10-CM | POA: Insufficient documentation

## 2017-10-04 LAB — URINALYSIS, COMPLETE (UACMP) WITH MICROSCOPIC
BILIRUBIN URINE: NEGATIVE
Glucose, UA: NEGATIVE mg/dL
Hgb urine dipstick: NEGATIVE
Ketones, ur: 5 mg/dL — AB
Leukocytes, UA: NEGATIVE
Nitrite: NEGATIVE
PH: 7 (ref 5.0–8.0)
Protein, ur: NEGATIVE mg/dL
SPECIFIC GRAVITY, URINE: 1.026 (ref 1.005–1.030)
SQUAMOUS EPITHELIAL / LPF: NONE SEEN

## 2017-10-04 MED ORDER — AZITHROMYCIN 500 MG PO TABS
1000.0000 mg | ORAL_TABLET | Freq: Once | ORAL | Status: AC
  Administered 2017-10-04: 1000 mg via ORAL
  Filled 2017-10-04: qty 2

## 2017-10-04 MED ORDER — CEFTRIAXONE SODIUM 250 MG IJ SOLR
250.0000 mg | Freq: Once | INTRAMUSCULAR | Status: AC
  Administered 2017-10-04: 250 mg via INTRAMUSCULAR
  Filled 2017-10-04: qty 250

## 2017-10-04 NOTE — ED Provider Notes (Signed)
[facility] Emergency Department Provider Note  ____________________________________________  Time seen: Approximately 9:01 PM  I have reviewed the triage vital signs and the nursing notes.   HISTORY  Chief Complaint Urinary Tract Infection    HPI Nathaniel Lynn is a 27 y.o. male who presents emergency department for dysuria and foul-smelling urine times 1 month.  Patient reports that he initially thought he had a UTI as his girlfriend had one.  However patient's symptoms have persisted.  He denies any testicular pain.  No abdominal pain.  No fevers or chills, nausea or vomiting, diarrhea or constipation.  No penile discharge.  No medications prior to arrival.  No complaints at this time.  History reviewed. No pertinent past medical history.  There are no active problems to display for this patient.   History reviewed. No pertinent surgical history.  Prior to Admission medications   Medication Sig Start Date End Date Taking? Authorizing Provider  Aspirin-Salicylamide-Caffeine (BC HEADACHE POWDER PO) Take 2 Packages by mouth daily as needed (pain).    [provider]  meloxicam (MOBIC) 15 MG tablet Take 1 tablet (15 mg total) by mouth daily. 03/23/16   Carsin Randazzo, Delorise RoyalsJonathan D, PA-C    Allergies Penicillins  No family history on file.  Social History Social History   Tobacco Use  . Smoking status: Current Every Day Smoker    Packs/day: 0.50    Types: Cigarettes  . Smokeless tobacco: Never Used  Substance Use Topics  . Alcohol use: Yes  . Drug use: Not Currently     Review of Systems  Constitutional: No fever/chills Eyes: No visual changes. Cardiovascular: no chest pain. Respiratory: no cough. No SOB. Gastrointestinal: No abdominal pain.  No nausea, no vomiting.  No diarrhea.  No constipation. Genitourinary: Positive for dysuria and foul urine smell times 1 month. No hematuria Musculoskeletal: Negative for musculoskeletal  pain. Skin: Negative for rash, abrasions, lacerations, ecchymosis. Neurological: Negative for headaches, focal weakness or numbness. 10-point ROS otherwise negative.  ____________________________________________   PHYSICAL EXAM:  VITAL SIGNS: ED Triage Vitals  Enc Vitals Group     BP 10/04/17 2020 138/74     Pulse Rate 10/04/17 2019 70     Resp 10/04/17 2019 18     Temp 10/04/17 2019 98 F (36.7 C)     Temp src --      SpO2 10/04/17 2019 100 %     Weight 10/04/17 2019 175 lb (79.4 kg)     Height 10/04/17 2019 6' (1.829 m)     Head Circumference --      Peak Flow --      Pain Score 10/04/17 2019 0     Pain Loc --      Pain Edu? --      Excl. in GC? --      Constitutional: Alert and oriented. Well appearing and in no acute distress. Eyes: Conjunctivae are normal. PERRL. EOMI. Head: Atraumatic. Neck: No stridor.    Cardiovascular: Normal rate, regular rhythm. Normal S1 and S2.  Good peripheral circulation. Respiratory: Normal respiratory effort without tachypnea or retractions. Lungs CTAB. Good air entry to the bases with no decreased or absent breath sounds. Gastrointestinal: Bowel sounds 4 quadrants. Soft and nontender to palpation. No guarding or rigidity. No palpable masses. No distention. No CVA tenderness. Musculoskeletal: Full range of motion to all extremities. No gross deformities appreciated. Neurologic:  Normal speech and language. No gross focal neurologic deficits are appreciated.  Skin:  Skin is warm,  dry and intact. No rash noted. Psychiatric: Mood and affect are normal. Speech and behavior are normal. Patient exhibits appropriate insight and judgement.   ____________________________________________   LABS (all labs ordered are listed, but only abnormal results are displayed)  Labs Reviewed  URINALYSIS, COMPLETE (UACMP) WITH MICROSCOPIC - Abnormal; Notable for the following components:      Result Value   Color, Urine AMBER (*)    APPearance CLOUDY  (*)    Ketones, ur 5 (*)    Bacteria, UA RARE (*)    All other components within normal limits  CHLAMYDIA/NGC RT PCR ([facility] ONLY)   ____________________________________________  EKG   ____________________________________________  RADIOLOGY   No results found.  ____________________________________________    PROCEDURES  Procedure(s) performed:    Procedures    Medications  cefTRIAXone (ROCEPHIN) injection 250 mg (has no administration in time range)  azithromycin (ZITHROMAX) tablet 1,000 mg (has no administration in time range)     ____________________________________________   INITIAL IMPRESSION / ASSESSMENT AND PLAN / ED COURSE  Pertinent labs & imaging results that were available during my care of the patient were reviewed by me and considered in my medical decision making (see chart for details).  Review of the  CSRS was performed in accordance of the [facility] prior to dispensing any controlled drugs.     Patient's diagnosis is consistent with dysuria.  Patient presented with dysuria and urine smell changes times 1 month.  Both patient and his girlfriend has similar symptoms.  At this time, suspect the patient has STD/STI.  I have ordered gonorrhea and Chlamydia testing however patient does not want to wait.  I will treat empirically with Rocephin and Zithromax.  Patient is allergic to penicillins as a child but he states it was not a life-threatening reaction.  As such, we will proceed with Rocephin.  Patient is advised to follow-up by calling the emergency department for his results.  If results are positive, he will follow-up with health department in 3 weeks for repeat testing to ensure clearance..  Patient is given ED precautions to return to the ED for any worsening or new symptoms.     ____________________________________________  FINAL CLINICAL IMPRESSION(S) / ED DIAGNOSES  Final diagnoses:  Dysuria  Sexually transmitted infection       NEW MEDICATIONS STARTED DURING THIS VISIT:  ED Discharge Orders    None          This chart was dictated using voice recognition software/Dragon. Despite best efforts to proofread, errors can occur which can change the meaning. Any change was purely unintentional.    Lanette Hampshire 10/04/17 2117    Sharman Cheek, MD 10/04/17 2337

## 2017-10-04 NOTE — ED Triage Notes (Signed)
Patient with complaint of pain with urination and foul smelling urine times one month.

## 2018-02-16 ENCOUNTER — Encounter: Payer: Self-pay | Admitting: Emergency Medicine

## 2018-02-16 ENCOUNTER — Emergency Department
Admission: EM | Admit: 2018-02-16 | Discharge: 2018-02-16 | Disposition: A | Discharge status: Home / Self Care | Payer: Self-pay | Attending: Emergency Medicine | Admitting: Emergency Medicine

## 2018-02-16 DIAGNOSIS — F17210 Nicotine dependence, cigarettes, uncomplicated: Secondary | ICD-10-CM | POA: Insufficient documentation

## 2018-02-16 DIAGNOSIS — B86 Scabies: Secondary | ICD-10-CM | POA: Insufficient documentation

## 2018-02-16 DIAGNOSIS — Z79899 Other long term (current) drug therapy: Secondary | ICD-10-CM | POA: Insufficient documentation

## 2018-02-16 MED ORDER — IVERMECTIN 3 MG PO TABS
200.0000 ug/kg | ORAL_TABLET | Freq: Once | ORAL | Status: AC
  Administered 2018-02-16: 15000 ug via ORAL
  Filled 2018-02-16: qty 5

## 2018-02-16 MED ORDER — IVERMECTIN 3 MG PO TABS: 200.0000 ug/kg | ORAL_TABLET | Freq: Once | ORAL | 0 refills | Status: AC
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
## 2018-02-16 NOTE — ED Notes (Signed)
Pharmacy to send up medication 
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
## 2018-03-03 ENCOUNTER — Emergency Department: Payer: Self-pay

## 2018-03-03 ENCOUNTER — Other Ambulatory Visit: Payer: Self-pay

## 2018-03-03 ENCOUNTER — Emergency Department
Admission: EM | Admit: 2018-03-03 | Discharge: 2018-03-03 | Disposition: A | Discharge status: Home / Self Care | Payer: Self-pay | Attending: Emergency Medicine | Admitting: Emergency Medicine

## 2018-03-03 ENCOUNTER — Encounter: Payer: Self-pay | Admitting: Emergency Medicine

## 2018-03-03 DIAGNOSIS — Y929 Unspecified place or not applicable: Secondary | ICD-10-CM | POA: Insufficient documentation

## 2018-03-03 DIAGNOSIS — Y999 Unspecified external cause status: Secondary | ICD-10-CM | POA: Insufficient documentation

## 2018-03-03 DIAGNOSIS — Y939 Activity, unspecified: Secondary | ICD-10-CM | POA: Insufficient documentation

## 2018-03-03 DIAGNOSIS — F17210 Nicotine dependence, cigarettes, uncomplicated: Secondary | ICD-10-CM | POA: Insufficient documentation

## 2018-03-03 DIAGNOSIS — X58XXXA Exposure to other specified factors, initial encounter: Secondary | ICD-10-CM | POA: Insufficient documentation

## 2018-03-03 DIAGNOSIS — S6991XA Unspecified injury of right wrist, hand and finger(s), initial encounter: Secondary | ICD-10-CM | POA: Insufficient documentation

## 2018-03-03 MED ORDER — MELOXICAM 15 MG PO TABS: 15.0000 mg | ORAL_TABLET | Freq: Every day | ORAL | 0 refills | Status: DC

## 2018-03-03 NOTE — ED Provider Notes (Signed)
[facility] Emergency Department Provider Note  ____________________________________________  Time seen: Approximately 10:37 PM  I have reviewed the triage vital signs and the nursing notes.   HISTORY  Chief Complaint Finger Injury    HPI Nathaniel Lynn is a 27 y.o. male who presents the emergency department complaining of injury to the second digit of the right hand.  Patient reports that he was holding his nephew, was poking his left hand with his right digit when he felt a sudden pain to the index finger.  Patient reports that he looked down, saw hyperextension as well as deformity around the PIP joint.  Patient reports he had immediate pain.  Patient states that he grabbed his finger, felt an abnormality and slightly pulled which resulted in an audible as well as palpable pop.  Patient reports at this time there is no remaining deformity to the PIP joint.  Patient reports he is able to extend and flex the digit but doing so elicits pain.  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  The patient is requesting x-ray to determine whether he can continue working.    History reviewed. No pertinent past medical history.  There are no active problems to display for this patient.   History reviewed. No pertinent surgical history.  Prior to Admission medications   Medication Sig Start Date End Date Taking? Authorizing Provider  Aspirin-Salicylamide-Caffeine (BC HEADACHE POWDER PO) Take 2 Packages by mouth daily as needed (pain).    [provider]  meloxicam (MOBIC) 15 MG tablet Take 1 tablet (15 mg total) by mouth daily. 03/03/18   Cuthriell, Delorise Royals, PA-C    Allergies Penicillins  No family history on file.  Social History Social History   Tobacco Use  . Smoking status: Current Every Day Smoker    Packs/day: 0.50     Types: Cigarettes  . Smokeless tobacco: Never Used  Substance Use Topics  . Alcohol use: Yes  . Drug use: Not Currently     Review of Systems  Constitutional: No fever/chills Eyes: No visual changes.  Cardiovascular: no chest pain. Respiratory: no cough. No SOB. Gastrointestinal: No abdominal pain.  No nausea, no vomiting.   Musculoskeletal: Positive for injury to the second digit of the right hand Skin: Negative for rash, abrasions, lacerations, ecchymosis. Neurological: Negative for headaches, focal weakness or numbness. 10-point ROS otherwise negative.  ____________________________________________   PHYSICAL EXAM:  VITAL SIGNS: ED Triage Vitals [03/03/18 2101]  Enc Vitals Group     BP (!) 139/91     Pulse Rate 70     Resp 18     Temp 98.4 F (36.9 C)     Temp src      SpO2      Weight 170 lb (77.1 kg)     Height 6' (1.829 m)     Head Circumference      Peak Flow      Pain Score 10     Pain Loc      Pain Edu?      Excl. in GC?      Constitutional: Alert and oriented. Well appearing and in no acute distress. Eyes: Conjunctivae are normal. PERRL. EOMI. Head: Atraumatic. Neck: No stridor.    Cardiovascular: Normal rate, regular rhythm. Normal S1 and S2.  Good peripheral circulation. Respiratory: Normal respiratory  effort without tachypnea or retractions. Lungs CTAB. Good air entry to the bases with no decreased or absent breath sounds. Musculoskeletal: Full range of motion to all extremities. No gross deformities appreciated.  Visualization of the index finger right hand reveals mild edema around the PIP joint.  No deformity.  Patient is able to extend and flex the digit appropriately.  Sensation capillary refill intact.  Patient is very tender to palpation over the PIP joint with no palpable abnormality at this time.  No other visible abnormality to the right hand.  No other tenderness to palpation along the right hand. Neurologic:  Normal speech and language.  No gross focal neurologic deficits are appreciated.  Skin:  Skin is warm, dry and intact. No rash noted. Psychiatric: Mood and affect are normal. Speech and behavior are normal. Patient exhibits appropriate insight and judgement.   ____________________________________________   LABS (all labs ordered are listed, but only abnormal results are displayed)  Labs Reviewed - No data to display ____________________________________________  EKG   ____________________________________________  RADIOLOGY I personally viewed and evaluated these images as part of my medical decision making, as well as reviewing the written report by the radiologist.  I concur with radiologist finding of no acute fractures or dislocation to the right index finger.  Edema is appreciated in the soft tissue at the PIP joint.  Dg Finger Index Right  Result Date: 03/03/2018 CLINICAL DATA:  Right index finger pain status post injury prior to arrival. Patient states finger dislocated at the PIP joint and reduced on its own. EXAM: RIGHT INDEX FINGER 2+V COMPARISON:  None. FINDINGS: Dorsal soft tissue swelling at the PIP joint of the right index finger. No acute fracture nor joint dislocation. IMPRESSION: No fracture or malalignment. Soft tissue swelling over the dorsum of the right index finger at the PIP joint level. Electronically Signed   By: Tollie Eth M.D.   On: 03/03/2018 21:35    ____________________________________________    PROCEDURES  Procedure(s) performed:    .Splint Application Date/Time: 03/03/2018 10:58 PM Performed by: Racheal Patches, PA-C Authorized by: Racheal Patches, PA-C   Consent:    Consent obtained:  Verbal   Consent given by:  Patient   Risks discussed:  Pain and swelling Pre-procedure details:    Sensation:  Normal Procedure details:    Location:  Finger   Finger:  R index finger   Splint type:  Finger   Supplies:  Prefabricated splint and elastic  bandage Post-procedure details:    Pain:  Improved   Sensation:  Normal   Patient tolerance of procedure:  Tolerated well, no immediate complications      Medications - No data to display   ____________________________________________   INITIAL IMPRESSION / ASSESSMENT AND PLAN / ED COURSE  Pertinent labs & imaging results that were available during my care of the patient were reviewed by me and considered in my medical decision making (see chart for details).  Review of the  [place] was performed in accordance of the [facility] prior to dispensing any controlled drugs.      Patient's diagnosis is consistent with finger injury.  Patient presents the emergency department with injury to the index finger of his right hand.  Patient verbalized injury, initial findings is consistent with finger dislocation.  Patient reports that he put traction on the digit with manipulation over the PIP joint with a audible, and palpably appreciable reduction of probable dislocation.  No visible deformity at this time.  Good range  of motion with no indication of acute ligamentous rupture.  X-ray reveals no dislocation or fracture.  Finger splinted at this time.  Meloxicam will be provided for symptom relief.  Patient will follow primary care as needed. Patient is given ED precautions to return to the ED for any worsening or new symptoms.     ____________________________________________  FINAL CLINICAL IMPRESSION(S) / ED DIAGNOSES  Final diagnoses:  Injury of finger of right hand, initial encounter      NEW MEDICATIONS STARTED DURING THIS VISIT:  ED Discharge Orders         Ordered    meloxicam (MOBIC) 15 MG tablet  Daily     03/03/18 2254              This chart was dictated using voice recognition software/Dragon. Despite best efforts to proofread, errors can occur which can change the meaning. Any change was purely unintentional.    Racheal Patches, PA-C 03/03/18 2258     Jeanmarie Plant, MD 03/06/18 2113

## 2018-03-03 NOTE — ED Triage Notes (Signed)
Patient ambulatory to triage with steady gait, without difficulty or distress noted; pt reports injuring right index finger PTA; st was pointing finger into palm of his hand and "pushed too hard"

## 2020-11-10 ENCOUNTER — Emergency Department: Payer: Self-pay

## 2020-11-10 ENCOUNTER — Other Ambulatory Visit: Payer: Self-pay

## 2020-11-10 ENCOUNTER — Inpatient Hospital Stay (HOSPITAL_COMMUNITY)
Admission: AD | Admit: 2020-11-10 | Discharge: 2020-12-07 | DRG: 100 | Disposition: A | Discharge status: Rehabilitation Facility | Payer: Self-pay | Source: Other Acute Inpatient Hospital | Attending: Internal Medicine | Admitting: Internal Medicine

## 2020-11-10 ENCOUNTER — Inpatient Hospital Stay (HOSPITAL_COMMUNITY): Payer: Self-pay

## 2020-11-10 ENCOUNTER — Emergency Department
Admission: EM | Admit: 2020-11-10 | Discharge: 2020-11-10 | Disposition: A | Discharge status: Other Acute Inpatient Hospital | Payer: Self-pay | Attending: Emergency Medicine | Admitting: Emergency Medicine

## 2020-11-10 DIAGNOSIS — Z20822 Contact with and (suspected) exposure to covid-19: Secondary | ICD-10-CM | POA: Diagnosis present

## 2020-11-10 DIAGNOSIS — R778 Other specified abnormalities of plasma proteins: Secondary | ICD-10-CM | POA: Insufficient documentation

## 2020-11-10 DIAGNOSIS — E861 Hypovolemia: Secondary | ICD-10-CM

## 2020-11-10 DIAGNOSIS — F1123 Opioid dependence with withdrawal: Secondary | ICD-10-CM | POA: Diagnosis present

## 2020-11-10 DIAGNOSIS — E669 Obesity, unspecified: Secondary | ICD-10-CM | POA: Diagnosis present

## 2020-11-10 DIAGNOSIS — J156 Pneumonia due to other aerobic Gram-negative bacteria: Secondary | ICD-10-CM | POA: Diagnosis not present

## 2020-11-10 DIAGNOSIS — E870 Hyperosmolality and hypernatremia: Secondary | ICD-10-CM

## 2020-11-10 DIAGNOSIS — F1210 Cannabis abuse, uncomplicated: Secondary | ICD-10-CM | POA: Diagnosis present

## 2020-11-10 DIAGNOSIS — R4189 Other symptoms and signs involving cognitive functions and awareness: Secondary | ICD-10-CM

## 2020-11-10 DIAGNOSIS — T50904A Poisoning by unspecified drugs, medicaments and biological substances, undetermined, initial encounter: Secondary | ICD-10-CM

## 2020-11-10 DIAGNOSIS — L8990 Pressure ulcer of unspecified site, unspecified stage: Secondary | ICD-10-CM | POA: Insufficient documentation

## 2020-11-10 DIAGNOSIS — E8770 Fluid overload, unspecified: Secondary | ICD-10-CM | POA: Diagnosis not present

## 2020-11-10 DIAGNOSIS — I82612 Acute embolism and thrombosis of superficial veins of left upper extremity: Secondary | ICD-10-CM | POA: Diagnosis not present

## 2020-11-10 DIAGNOSIS — J690 Pneumonitis due to inhalation of food and vomit: Secondary | ICD-10-CM

## 2020-11-10 DIAGNOSIS — G9340 Encephalopathy, unspecified: Secondary | ICD-10-CM

## 2020-11-10 DIAGNOSIS — W19XXXA Unspecified fall, initial encounter: Secondary | ICD-10-CM | POA: Diagnosis present

## 2020-11-10 DIAGNOSIS — Z781 Physical restraint status: Secondary | ICD-10-CM

## 2020-11-10 DIAGNOSIS — E440 Moderate protein-calorie malnutrition: Secondary | ICD-10-CM | POA: Diagnosis present

## 2020-11-10 DIAGNOSIS — N170 Acute kidney failure with tubular necrosis: Secondary | ICD-10-CM | POA: Diagnosis present

## 2020-11-10 DIAGNOSIS — Z6835 Body mass index (BMI) 35.0-35.9, adult: Secondary | ICD-10-CM

## 2020-11-10 DIAGNOSIS — R6520 Severe sepsis without septic shock: Secondary | ICD-10-CM

## 2020-11-10 DIAGNOSIS — E872 Acidosis: Secondary | ICD-10-CM | POA: Diagnosis present

## 2020-11-10 DIAGNOSIS — R7401 Elevation of levels of liver transaminase levels: Secondary | ICD-10-CM | POA: Diagnosis present

## 2020-11-10 DIAGNOSIS — E876 Hypokalemia: Secondary | ICD-10-CM | POA: Diagnosis present

## 2020-11-10 DIAGNOSIS — J9601 Acute respiratory failure with hypoxia: Secondary | ICD-10-CM

## 2020-11-10 DIAGNOSIS — I248 Other forms of acute ischemic heart disease: Secondary | ICD-10-CM | POA: Diagnosis present

## 2020-11-10 DIAGNOSIS — M6282 Rhabdomyolysis: Secondary | ICD-10-CM | POA: Diagnosis present

## 2020-11-10 DIAGNOSIS — G931 Anoxic brain damage, not elsewhere classified: Secondary | ICD-10-CM | POA: Diagnosis present

## 2020-11-10 DIAGNOSIS — G928 Other toxic encephalopathy: Secondary | ICD-10-CM | POA: Diagnosis present

## 2020-11-10 DIAGNOSIS — S7400XA Injury of sciatic nerve at hip and thigh level, unspecified leg, initial encounter: Secondary | ICD-10-CM | POA: Diagnosis present

## 2020-11-10 DIAGNOSIS — R4020 Unspecified coma: Secondary | ICD-10-CM | POA: Insufficient documentation

## 2020-11-10 DIAGNOSIS — G40901 Epilepsy, unspecified, not intractable, with status epilepticus: Principal | ICD-10-CM | POA: Diagnosis present

## 2020-11-10 DIAGNOSIS — T43621A Poisoning by amphetamines, accidental (unintentional), initial encounter: Secondary | ICD-10-CM | POA: Diagnosis present

## 2020-11-10 DIAGNOSIS — Z880 Allergy status to penicillin: Secondary | ICD-10-CM

## 2020-11-10 DIAGNOSIS — L89893 Pressure ulcer of other site, stage 3: Secondary | ICD-10-CM | POA: Diagnosis not present

## 2020-11-10 DIAGNOSIS — D75839 Thrombocytosis, unspecified: Secondary | ICD-10-CM | POA: Diagnosis present

## 2020-11-10 DIAGNOSIS — M79673 Pain in unspecified foot: Secondary | ICD-10-CM

## 2020-11-10 DIAGNOSIS — D62 Acute posthemorrhagic anemia: Secondary | ICD-10-CM | POA: Diagnosis present

## 2020-11-10 DIAGNOSIS — I6329 Cerebral infarction due to unspecified occlusion or stenosis of other precerebral arteries: Secondary | ICD-10-CM | POA: Diagnosis present

## 2020-11-10 DIAGNOSIS — S0003XA Contusion of scalp, initial encounter: Secondary | ICD-10-CM | POA: Diagnosis present

## 2020-11-10 DIAGNOSIS — R451 Restlessness and agitation: Secondary | ICD-10-CM | POA: Diagnosis not present

## 2020-11-10 DIAGNOSIS — R748 Abnormal levels of other serum enzymes: Secondary | ICD-10-CM | POA: Insufficient documentation

## 2020-11-10 DIAGNOSIS — R7989 Other specified abnormal findings of blood chemistry: Secondary | ICD-10-CM | POA: Insufficient documentation

## 2020-11-10 DIAGNOSIS — J189 Pneumonia, unspecified organism: Secondary | ICD-10-CM

## 2020-11-10 DIAGNOSIS — N1330 Unspecified hydronephrosis: Secondary | ICD-10-CM | POA: Diagnosis present

## 2020-11-10 DIAGNOSIS — L089 Local infection of the skin and subcutaneous tissue, unspecified: Secondary | ICD-10-CM | POA: Diagnosis present

## 2020-11-10 DIAGNOSIS — N179 Acute kidney failure, unspecified: Secondary | ICD-10-CM

## 2020-11-10 DIAGNOSIS — R197 Diarrhea, unspecified: Secondary | ICD-10-CM | POA: Diagnosis not present

## 2020-11-10 DIAGNOSIS — I10 Essential (primary) hypertension: Secondary | ICD-10-CM | POA: Diagnosis present

## 2020-11-10 DIAGNOSIS — F1510 Other stimulant abuse, uncomplicated: Secondary | ICD-10-CM | POA: Diagnosis present

## 2020-11-10 DIAGNOSIS — J988 Other specified respiratory disorders: Secondary | ICD-10-CM

## 2020-11-10 HISTORY — DX: Other psychoactive substance use, unspecified, uncomplicated: F19.90

## 2020-11-10 LAB — COMPREHENSIVE METABOLIC PANEL
ALT: 265 U/L — ABNORMAL HIGH (ref 0–44)
AST: 669 U/L — ABNORMAL HIGH (ref 15–41)
Albumin: 4.8 g/dL (ref 3.5–5.0)
Alkaline Phosphatase: 93 U/L (ref 38–126)
Anion gap: 17 — ABNORMAL HIGH (ref 5–15)
BUN: 24 mg/dL — ABNORMAL HIGH (ref 6–20)
CO2: 23 mmol/L (ref 22–32)
Calcium: 9 mg/dL (ref 8.9–10.3)
Chloride: 97 mmol/L — ABNORMAL LOW (ref 98–111)
Creatinine, Ser: 2.33 mg/dL — ABNORMAL HIGH (ref 0.61–1.24)
GFR, Estimated: 38 mL/min — ABNORMAL LOW (ref >60)
Glucose, Bld: 126 mg/dL — ABNORMAL HIGH (ref 70–99)
Potassium: 4.8 mmol/L (ref 3.5–5.1)
Sodium: 137 mmol/L (ref 135–145)
Total Bilirubin: 1.1 mg/dL (ref 0.3–1.2)
Total Protein: 8.8 g/dL — ABNORMAL HIGH (ref 6.5–8.1)

## 2020-11-10 LAB — CBC WITH DIFFERENTIAL/PLATELET
Abs Immature Granulocytes: 0.1 10*3/uL — ABNORMAL HIGH (ref 0.00–0.07)
Basophils Absolute: 0 10*3/uL (ref 0.0–0.1)
Basophils Relative: 0 %
Eosinophils Absolute: 0 10*3/uL (ref 0.0–0.5)
Eosinophils Relative: 0 %
HCT: 48.1 % (ref 39.0–52.0)
Hemoglobin: 16.6 g/dL (ref 13.0–17.0)
Immature Granulocytes: 1 %
Lymphocytes Relative: 8 %
Lymphs Abs: 1.2 10*3/uL (ref 0.7–4.0)
MCH: 30.9 pg (ref 26.0–34.0)
MCHC: 34.5 g/dL (ref 30.0–36.0)
MCV: 89.6 fL (ref 80.0–100.0)
Monocytes Absolute: 1.9 10*3/uL — ABNORMAL HIGH (ref 0.1–1.0)
Monocytes Relative: 12 %
Neutro Abs: 12.1 10*3/uL — ABNORMAL HIGH (ref 1.7–7.7)
Neutrophils Relative %: 79 %
Platelets: 312 10*3/uL (ref 150–400)
RBC: 5.37 MIL/uL (ref 4.22–5.81)
RDW: 12.6 % (ref 11.5–15.5)
WBC: 15.3 10*3/uL — ABNORMAL HIGH (ref 4.0–10.5)
nRBC: 0 % (ref 0.0–0.2)

## 2020-11-10 LAB — URINE DRUG SCREEN, QUALITATIVE (ARMC ONLY)
Amphetamines, Ur Screen: POSITIVE — AB
Barbiturates, Ur Screen: NOT DETECTED
Benzodiazepine, Ur Scrn: NOT DETECTED
Cannabinoid 50 Ng, Ur ~~LOC~~: POSITIVE — AB
Cocaine Metabolite,Ur ~~LOC~~: NOT DETECTED
MDMA (Ecstasy)Ur Screen: NOT DETECTED
Methadone Scn, Ur: NOT DETECTED
Opiate, Ur Screen: POSITIVE — AB
Phencyclidine (PCP) Ur S: NOT DETECTED
Tricyclic, Ur Screen: NOT DETECTED

## 2020-11-10 LAB — BLOOD GAS, ARTERIAL
Allens test (pass/fail): POSITIVE — AB
Bicarbonate: 21.1 mmol/L (ref 20.0–28.0)
FIO2: 0.35
MECHVT: 500 mL
O2 Saturation: 99.6 %
PEEP: 5 cmH2O
RATE: 16 resp/min
pCO2 arterial: 34 mmHg (ref 32.0–48.0)
pH, Arterial: 7.4 (ref 7.350–7.450)
pO2, Arterial: 178 mmHg — ABNORMAL HIGH (ref 83.0–108.0)

## 2020-11-10 LAB — URINALYSIS, COMPLETE (UACMP) WITH MICROSCOPIC
Bilirubin Urine: NEGATIVE
Glucose, UA: 50 mg/dL — AB
Ketones, ur: 5 mg/dL — AB
Leukocytes,Ua: NEGATIVE
Nitrite: NEGATIVE
Protein, ur: 100 mg/dL — AB
Specific Gravity, Urine: 1.027 (ref 1.005–1.030)
Squamous Epithelial / HPF: NONE SEEN (ref 0–5)
WBC, UA: >50 WBC/hpf — ABNORMAL HIGH (ref 0–5)
pH: 6 (ref 5.0–8.0)

## 2020-11-10 LAB — ETHANOL: Alcohol, Ethyl (B): <10 mg/dL (ref <10)

## 2020-11-10 LAB — CBG MONITORING, ED: Glucose-Capillary: 130 mg/dL — ABNORMAL HIGH (ref 70–99)

## 2020-11-10 LAB — CK: Total CK: >50000 U/L — ABNORMAL HIGH (ref 49–397)

## 2020-11-10 LAB — GLUCOSE, CAPILLARY: Glucose-Capillary: 97 mg/dL (ref 70–99)

## 2020-11-10 LAB — TROPONIN I (HIGH SENSITIVITY)
Troponin I (High Sensitivity): 1848 ng/L (ref <18)
Troponin I (High Sensitivity): 2806 ng/L (ref <18)

## 2020-11-10 LAB — RESP PANEL BY RT-PCR (FLU A&B, COVID) ARPGX2
Influenza A by PCR: NEGATIVE
Influenza B by PCR: NEGATIVE
SARS Coronavirus 2 by RT PCR: NEGATIVE

## 2020-11-10 LAB — LACTIC ACID, PLASMA
Lactic Acid, Venous: 3 mmol/L (ref 0.5–1.9)
Lactic Acid, Venous: 3 mmol/L (ref 0.5–1.9)

## 2020-11-10 LAB — SALICYLATE LEVEL: Salicylate Lvl: <7 mg/dL — ABNORMAL LOW (ref 7.0–30.0)

## 2020-11-10 LAB — ACETAMINOPHEN LEVEL: Acetaminophen (Tylenol), Serum: <10 ug/mL — ABNORMAL LOW (ref 10–30)

## 2020-11-10 MED ORDER — PROPOFOL 1000 MG/100ML IV EMUL
5.0000 ug/kg/min | INTRAVENOUS | Status: DC
  Administered 2020-11-10: 5 ug/kg/min via INTRAVENOUS
  Administered 2020-11-10: 20 ug/kg/min via INTRAVENOUS
  Filled 2020-11-10: qty 100

## 2020-11-10 MED ORDER — POLYVINYL ALCOHOL 1.4 % OP SOLN
1.0000 [drp] | Freq: Three times a day (TID) | OPHTHALMIC | Status: DC
  Administered 2020-11-11 – 2020-12-06 (×52): 1 [drp] via OPHTHALMIC
  Filled 2020-11-10 (×2): qty 15

## 2020-11-10 MED ORDER — THIAMINE HCL 100 MG/ML IJ SOLN
100.0000 mg | Freq: Every day | INTRAMUSCULAR | Status: DC
  Administered 2020-11-11: 100 mg via INTRAVENOUS
  Filled 2020-11-10: qty 2

## 2020-11-10 MED ORDER — DOCUSATE SODIUM 50 MG/5ML PO LIQD
100.0000 mg | Freq: Two times a day (BID) | ORAL | Status: DC
  Administered 2020-11-11 – 2020-11-21 (×9): 100 mg
  Filled 2020-11-10 (×8): qty 10

## 2020-11-10 MED ORDER — PROPOFOL 1000 MG/100ML IV EMUL: 5.0000 ug/kg/min | INTRAVENOUS | Status: DC

## 2020-11-10 MED ORDER — PROPOFOL 1000 MG/100ML IV EMUL
INTRAVENOUS | Status: AC
  Administered 2020-11-10: 5 ug/kg/min via INTRAVENOUS
  Filled 2020-11-10: qty 100

## 2020-11-10 MED ORDER — LORAZEPAM 2 MG/ML IJ SOLN
2.0000 mg | Freq: Once | INTRAMUSCULAR | Status: AC
  Administered 2020-11-10: 2 mg via INTRAVENOUS

## 2020-11-10 MED ORDER — HEPARIN SODIUM (PORCINE) 5000 UNIT/ML IJ SOLN
5000.0000 [IU] | Freq: Three times a day (TID) | INTRAMUSCULAR | Status: DC
  Administered 2020-11-11: 5000 [IU] via SUBCUTANEOUS

## 2020-11-10 MED ORDER — PANTOPRAZOLE SODIUM 40 MG IV SOLR
40.0000 mg | Freq: Every day | INTRAVENOUS | Status: DC
  Administered 2020-11-11 (×2): 40 mg via INTRAVENOUS
  Filled 2020-11-10: qty 40

## 2020-11-10 MED ORDER — LACTATED RINGERS IV BOLUS (SEPSIS)
1000.0000 mL | Freq: Once | INTRAVENOUS | Status: AC
  Administered 2020-11-10: 1000 mL via INTRAVENOUS

## 2020-11-10 MED ORDER — INSULIN ASPART 100 UNIT/ML IJ SOLN
0.0000 [IU] | INTRAMUSCULAR | Status: DC
  Administered 2020-11-11: 2 [IU] via SUBCUTANEOUS
  Administered 2020-11-12 – 2020-11-27 (×11): 1 [IU] via SUBCUTANEOUS

## 2020-11-10 MED ORDER — LACTATED RINGERS IV BOLUS
1000.0000 mL | Freq: Once | INTRAVENOUS | Status: AC
  Administered 2020-11-10: 1000 mL via INTRAVENOUS

## 2020-11-10 MED ORDER — LACTATED RINGERS IV BOLUS (SEPSIS)
500.0000 mL | Freq: Once | INTRAVENOUS | Status: AC
  Administered 2020-11-10: 500 mL via INTRAVENOUS

## 2020-11-10 MED ORDER — LACTATED RINGERS IV SOLN: INTRAVENOUS | Status: DC

## 2020-11-10 MED ORDER — NEPRO/CARBSTEADY PO LIQD
237.0000 mL | ORAL | Status: DC
  Filled 2020-11-10: qty 237

## 2020-11-10 MED ORDER — METRONIDAZOLE 500 MG/100ML IV SOLN
500.0000 mg | Freq: Once | INTRAVENOUS | Status: AC
  Administered 2020-11-10: 500 mg via INTRAVENOUS
  Filled 2020-11-10: qty 100

## 2020-11-10 MED ORDER — IPRATROPIUM-ALBUTEROL 0.5-2.5 (3) MG/3ML IN SOLN
3.0000 mL | Freq: Four times a day (QID) | RESPIRATORY_TRACT | Status: DC
  Administered 2020-11-11: 3 mL via RESPIRATORY_TRACT
  Filled 2020-11-10: qty 3

## 2020-11-10 MED ORDER — FENTANYL CITRATE (PF) 100 MCG/2ML IJ SOLN
50.0000 ug | Freq: Once | INTRAMUSCULAR | Status: AC
  Administered 2020-11-11: 50 ug via INTRAVENOUS
  Filled 2020-11-10: qty 2

## 2020-11-10 MED ORDER — VANCOMYCIN HCL IN DEXTROSE 1-5 GM/200ML-% IV SOLN
1000.0000 mg | Freq: Once | INTRAVENOUS | Status: AC
  Administered 2020-11-10: 1000 mg via INTRAVENOUS
  Filled 2020-11-10: qty 200

## 2020-11-10 MED ORDER — LEVETIRACETAM IN NACL 500 MG/100ML IV SOLN
500.0000 mg | Freq: Two times a day (BID) | INTRAVENOUS | Status: DC
  Administered 2020-11-11: 500 mg via INTRAVENOUS
  Filled 2020-11-10: qty 100

## 2020-11-10 MED ORDER — SODIUM CHLORIDE 0.9 % IV SOLN
2.0000 g | Freq: Once | INTRAVENOUS | Status: AC
  Administered 2020-11-10: 2 g via INTRAVENOUS
  Filled 2020-11-10: qty 2

## 2020-11-10 MED ORDER — LORAZEPAM 2 MG/ML IJ SOLN
1.0000 mg | Freq: Once | INTRAMUSCULAR | Status: AC
  Administered 2020-11-10: 1 mg via INTRAVENOUS

## 2020-11-10 MED ORDER — LORAZEPAM 2 MG/ML IJ SOLN: 2.0000 mg | INTRAMUSCULAR | Status: DC | PRN

## 2020-11-10 MED ORDER — POLYETHYLENE GLYCOL 3350 17 G PO PACK
17.0000 g | PACK | Freq: Every day | ORAL | Status: DC
  Administered 2020-11-11 – 2020-11-21 (×5): 17 g
  Filled 2020-11-10 (×5): qty 1

## 2020-11-10 MED ORDER — DOCUSATE SODIUM 100 MG PO CAPS: 100.0000 mg | ORAL_CAPSULE | Freq: Two times a day (BID) | ORAL | Status: DC | PRN

## 2020-11-10 MED ORDER — FENTANYL 2500MCG IN NS 250ML (10MCG/ML) PREMIX INFUSION
25.0000 ug/h | INTRAVENOUS | Status: DC
  Administered 2020-11-11 – 2020-11-12 (×2): 50 ug/h via INTRAVENOUS
  Filled 2020-11-10 (×2): qty 250

## 2020-11-10 MED ORDER — POLYETHYLENE GLYCOL 3350 17 G PO PACK: 17.0000 g | PACK | Freq: Every day | ORAL | Status: DC | PRN

## 2020-11-10 MED ORDER — LEVETIRACETAM IN NACL 1500 MG/100ML IV SOLN
1500.0000 mg | Freq: Once | INTRAVENOUS | Status: AC
  Administered 2020-11-10: 1500 mg via INTRAVENOUS
  Filled 2020-11-10: qty 100

## 2020-11-10 MED ORDER — PROPOFOL 1000 MG/100ML IV EMUL
0.0000 ug/kg/min | INTRAVENOUS | Status: DC
  Administered 2020-11-10: 40 ug/kg/min via INTRAVENOUS
  Filled 2020-11-10: qty 200

## 2020-11-10 NOTE — ED Notes (Signed)
Pt has history of heroine and meth use per EMS. House arrest bracelet noted on left ankle.

## 2020-11-10 NOTE — ED Notes (Signed)
Pinpoint pupils noted.

## 2020-11-10 NOTE — ED Notes (Signed)
Tonic-clonic seizure-like movement noted.

## 2020-11-10 NOTE — Consult Note (Signed)
PHARMACY -  BRIEF ANTIBIOTIC NOTE   Pharmacy has received consult(s) for vancomycin and cefepime from an ED provider.  The patient's profile has been reviewed for ht/wt/allergies/indication/available labs.    One time order(s) placed for: Vancomycin 1g IV Cefepime 2g IV  Further antibiotics/pharmacy consults should be ordered by admitting physician if indicated.                       Raiford Noble, PharmD Pharmacy Resident  11/10/2020 4:38 PM

## 2020-11-10 NOTE — ED Notes (Signed)
Sister Baird Lyons updated that pt will go to [facility]- states she will notify mother/family.

## 2020-11-10 NOTE — ED Notes (Signed)
Verbal consent for transfer obtained over phone from pt's mother Maks Cavallero, witnessed by Zella Ball, Charity fundraiser. See consent forms.

## 2020-11-10 NOTE — ED Notes (Addendum)
Pt intubated at this time. 8 french 24 at the lip.

## 2020-11-10 NOTE — ED Notes (Signed)
Attempted to call mother to update without answer x2.

## 2020-11-10 NOTE — ED Provider Notes (Signed)
[facility] Emergency Department Provider Note   ____________________________________________   I have reviewed the triage vital signs and the nursing notes.   HISTORY  Chief Complaint Unresponsive  History limited by and level 5 caveat due to: Unresponsiveness   HPI Nathaniel Lynn is a 30 y.o. male who presents to the emergency department today via EMS as emergency traffic after being found unresponsive by family. EMS was not sure when patient was last seen by family. Per report the patient has history of substance abuse so narcan was tried in the field without any significant change in his status.    Records reviewed. Per medical record review patient has been seen in the emergency department in the past for orthopedic and dermatologic concerns.    Prior to Admission medications   Not on File    Allergies Patient has no allergy information on record.  No family history on file.  Social History History of smoking  Review of Systems Unable to obtain secondary to unresponsiveness.  ____________________________________________   PHYSICAL EXAM:  VITAL SIGNS: ED Triage Vitals  Enc Vitals Group     BP 11/10/20 1538 135/75     Pulse Rate 11/10/20 1538 (!) 142     Resp 11/10/20 1538 (!) 36     Temp --      Temp src --      SpO2 11/10/20 1537 97 %     Weight 11/10/20 1604 171 lb 15.3 oz (78 kg)     Height 11/10/20 1539 5\' 10"  (1.778 m)     Head Circumference --      Peak Flow --      Pain Score 11/10/20 1538 0   Constitutional: Minimally responsive to painful stimuli.  Eyes: Pupils are dilated bilaterally and sluggishly reactive.  ENT      Head: Normocephalic and atraumatic.      Nose: No congestion/rhinnorhea.      Mouth/Throat: Mucous membranes are moist.      Neck: No stridor. Hematological/Lymphatic/Immunilogical: No cervical lymphadenopathy. Cardiovascular: Tachycardic, regular rhythm.  No murmurs, rubs, or gallops.  Respiratory:  Increased respiratory rate, coarse breath sounds.  Gastrointestinal: Soft and non tender. No rebound. No guarding.  Genitourinary: Deferred Musculoskeletal: Compartments soft in lower and upper extremities.  Neurologic:  Minimally responsive to painful stimuli. Occasional will jerk all families.  Skin:  Skin is clammy.  Psychiatric: Unable to assess.  ____________________________________________    LABS (pertinent positives/negatives)  CBC wbc 15.3, hgb 16.6, plt 312 Trop hs 1848 CMP na 137, k 4.8, glu 126, cr 2.33, ast 669, alt 265 Acetaminophen <10 Salicylate <7.0 Ethanol <10 UDS positive amphetamines, opiate, cannabinoid CK >50,000 ____________________________________________   EKG  I, 11/12/20, attending physician, personally viewed and interpreted this EKG  EKG Time: 1538 Rate: 105 Rhythm: sinus tachycardia Axis: right axis deviation Intervals: qtc 421 QRS: LVH ST changes: no st elevation Impression: abnormal ekg   ____________________________________________    RADIOLOGY  CXR Appropriate placement of ET and NG tube. Small opacity in right lower lung.  CT head No acute abnormality ____________________________________________   PROCEDURES  Procedures  CRITICAL CARE Performed by: Phineas Semen   Total critical care time: 45 minutes  Critical care time was exclusive of separately billable procedures and treating other patients.  Critical care was necessary to treat or prevent imminent or life-threatening deterioration.  Critical care was time spent personally by me on the following activities: development of treatment plan with patient and/or surrogate as well as  nursing, discussions with consultants, evaluation of patient's response to treatment, examination of patient, obtaining history from patient or surrogate, ordering and performing treatments and interventions, ordering and review of laboratory studies, ordering and review of  radiographic studies, pulse oximetry and re-evaluation of patient's condition.  INTUBATION Performed by: Phineas Semen  Required items: required blood products, implants, devices, and special equipment available Patient identity confirmed: provided demographic data and hospital-assigned identification number Time out: Immediately prior to procedure a "time out" was called to verify the correct patient, procedure, equipment, support staff and site/side marked as required.  Indications: airway protection/decreased responsiveness  Intubation method: Glidescope Laryngoscopy   Preoxygenation: BVM  Sedatives: Etomidate Paralytic: Rocuronium  Tube Size: 8 cuffed  Post-procedure assessment: chest rise and ETCO2 monitor Breath sounds: equal and absent over the epigastrium Tube secured with: ETT holder Chest x-ray interpreted by radiologist and me.  Chest x-ray findings: endotracheal tube in appropriate position  Patient tolerated the procedure well with no immediate complications.    ____________________________________________   INITIAL IMPRESSION / ASSESSMENT AND PLAN / ED COURSE  Pertinent labs & imaging results that were available during my care of the patient were reviewed by me and considered in my medical decision making (see chart for details).   Patient presented to the emergency department today because of concerns for decreased responsiveness.  Apparently patient was found by family.  Initially there was concerns for possible drug overdose and patient was given Narcan in the field without any response.  Upon a presentation to the emergency department he was found to be tachycardic with dilated pupils.  Patient also exhibited occasional brief episodes of muscle contraction of the upper extremities dizziness and extension of the lower extremities.  Given these episodes and concerned that the patient was not protecting his airway the decision was made to intubate.  Patient was  intubated with good ET tube placement per chest x-ray.  Patient's blood work was concerning for rhabdomyolysis.  CK was extremely elevated, urine was with blood and creatinine was elevated.  I do have concerns of potentially patient might of had an overdose early in the morning and then was on the ground until found by family.  This might explain the rhabdo.  Given concerns for posturing like behavior upon initial arrival I did discuss with Dr. Selina Cooley with neurology who felt patient would best be served by going to a facility with continuous EEG capability.  Because of this patient was discussed with [facility].  In further discussion with family they did ask about [facility] however [facility] did not have any beds.  ____________________________________________   FINAL CLINICAL IMPRESSION(S) / ED DIAGNOSES  Final diagnoses:  Elevated CK  Elevated troponin  Unresponsiveness  Elevated serum creatinine     Note: This dictation was prepared with Dragon dictation. Any transcriptional errors that result from this process are unintentional     Phineas Semen, MD 11/10/20 2200

## 2020-11-10 NOTE — H&P (Signed)
Nathaniel Lynn is an 30 y.o. male.   Chief Complaint: Unresponsiveness HPI:  Nathaniel Lynn. Nathaniel Lynn is a 30 year old young man with PMH significant for narcotic drug abuse.  The patient's mother reported that the patient had been released from prison 2 weeks ago after serving a 2 year sentence.  Since his release, he has been involved in drug abuse with his girlfriend.    The patient's mother reported that her daughter called her around 19 AM reporting that she was unable to reach her brother as he was not answering his phone.  She told her mother that the patient's probation officer called her to report that the patient's ankle monitoring device had sounded an alarm and the probation officer was unable to reach her brother. The daughter tried several times and when she could not reach her brother she called her mother at 4 AM who was already at work.   The sister was finally able to get another family member to go to the patient's girlfriend's house in search of her brother.  When she arrived around 3 pm, she found her brother unresponsive.   The girlfriend confirmed that they were getting "high". The girlfriend reported that she had given the patient narcan but she was still unable to arouse him.  Hence at that time 911 was called and the patient was taken to [facility] for evaluation. It is reported that the initial blood sugar was 140 in the field. [facility] nurse also documented that patient initially localized to painful stimuli.  The patient's toxicology screen came back positive for Amphetamines, opiates and cannabinoid. He was found to have acute kidney injury and CK level was > 50 thousand.  CT head was negative for acute pathology and lactic acid was elevated.  While in the ED, the patient was given Ativan x 2 for tonic-clonic seizure like activity.  The patient was intubated shortly after seizure like activity was noted.  He was also found to be " posturing" therefore the neurologist recommended  transfer to [facility] for continuous EEG monitoring.  I have seen and examine the patient.  He is unable to contribute to this consultation.  Hence this consultation was done with the assistance of the patient's mother and review of [facility] records.  No past medical history on file.    No family history on file. Social History:  has no history on file for tobacco use, alcohol use, and drug use.  Allergies: Not on File  No medications prior to admission.    Results for orders placed or performed during the hospital encounter of 11/10/20 (from the past 48 hour(s))  Resp Panel by RT-PCR (Flu A&B, Covid) Nasopharyngeal Swab     Status: None   Collection Time: 11/10/20  3:42 PM   Specimen: Nasopharyngeal Swab; Nasopharyngeal(NP) swabs in vial transport medium  Result Value Ref Range   SARS Coronavirus 2 by RT PCR NEGATIVE NEGATIVE    Comment: (NOTE) SARS-CoV-2 target nucleic acids are NOT DETECTED.  The SARS-CoV-2 RNA is generally detectable in upper respiratory specimens during the acute phase of infection. The lowest concentration of SARS-CoV-2 viral copies this assay can detect is 138 copies/mL. A negative result does not preclude SARS-Cov-2 infection and should not be used as the sole basis for treatment or other patient management decisions. A negative result may occur with  improper specimen collection/handling, submission of specimen other than nasopharyngeal swab, presence of viral mutation(s) within the areas targeted by this assay, and inadequate number  of viral copies(<138 copies/mL). A negative result must be combined with clinical observations, patient history, and epidemiological information. The expected result is Negative.  Fact Sheet for Patients:  BloggerCourse.com  Fact Sheet for Healthcare Providers:  SeriousBroker.it  This test is no t yet approved or cleared by the [place] FDA and  has been authorized for  detection and/or diagnosis of SARS-CoV-2 by FDA under an Emergency Use Authorization (EUA). This EUA will remain  in effect (meaning this test can be used) for the duration of the COVID-19 declaration under Section 564(b)(1) of the Act, 21 U.S.C.section 360bbb-3(b)(1), unless the authorization is terminated  or revoked sooner.       Influenza A by PCR NEGATIVE NEGATIVE   Influenza B by PCR NEGATIVE NEGATIVE    Comment: (NOTE) The Xpert Xpress SARS-CoV-2/FLU/RSV plus assay is intended as an aid in the diagnosis of influenza from Nasopharyngeal swab specimens and should not be used as a sole basis for treatment. Nasal washings and aspirates are unacceptable for Xpert Xpress SARS-CoV-2/FLU/RSV testing.  Fact Sheet for Patients: BloggerCourse.com  Fact Sheet for Healthcare Providers: SeriousBroker.it  This test is not yet approved or cleared by the [place] FDA and has been authorized for detection and/or diagnosis of SARS-CoV-2 by FDA under an Emergency Use Authorization (EUA). This EUA will remain in effect (meaning this test can be used) for the duration of the COVID-19 declaration under Section 564(b)(1) of the Act, 21 U.S.C. section 360bbb-3(b)(1), unless the authorization is terminated or revoked.  Performed at [facility], [address] [place], [place] [address]   CBG monitoring, ED     Status: Abnormal   Collection Time: 11/10/20  3:44 PM  Result Value Ref Range   Glucose-Capillary 130 (H) 70 - 99 mg/dL    Comment: Glucose reference range applies only to samples taken after fasting for at least 8 hours.  Comprehensive metabolic panel     Status: Abnormal   Collection Time: 11/10/20  3:51 PM  Result Value Ref Range   Sodium 137 135 - 145 mmol/L   Potassium 4.8 3.5 - 5.1 mmol/L   Chloride 97 (L) 98 - 111 mmol/L   CO2 23 22 - 32 mmol/L   Glucose, Bld 126 (H) 70 - 99 mg/dL    Comment: Glucose reference  range applies only to samples taken after fasting for at least 8 hours.   BUN 24 (H) 6 - 20 mg/dL   Creatinine, Ser 1.15 (H) 0.61 - 1.24 mg/dL   Calcium 9.0 8.9 - 72.6 mg/dL   Total Protein 8.8 (H) 6.5 - 8.1 g/dL   Albumin 4.8 3.5 - 5.0 g/dL   AST 203 (H) 15 - 41 U/L   ALT 265 (H) 0 - 44 U/L   Alkaline Phosphatase 93 38 - 126 U/L   Total Bilirubin 1.1 0.3 - 1.2 mg/dL   GFR, Estimated 38 (L) >60 mL/min    Comment: (NOTE) Calculated using the CKD-EPI Creatinine Equation (2021)    Anion gap 17 (H) 5 - 15    Comment: Performed at [facility], [address] [place], [place] [address]  Salicylate level     Status: Abnormal   Collection Time: 11/10/20  3:51 PM  Result Value Ref Range   Salicylate Lvl <7.0 (L) 7.0 - 30.0 mg/dL    Comment: Performed at [facility], [address] [place], [place] [address]  Acetaminophen level     Status: Abnormal   Collection Time: 11/10/20  3:51 PM  Result Value Ref Range   Acetaminophen (Tylenol), Serum <10 (L) 10 - 30 ug/mL    Comment: (NOTE) Therapeutic concentrations vary significantly. A range of 10-30 ug/mL  may be an effective concentration for many patients. However, some  are best treated at concentrations outside of this range. Acetaminophen concentrations >150 ug/mL at 4 hours after ingestion  and >50 ug/mL at 12 hours after ingestion are often associated with  toxic reactions.  Performed at [facility], [address] [place], [place] [address]   Ethanol     Status: None   Collection Time: 11/10/20  3:51 PM  Result Value Ref Range   Alcohol, Ethyl (B) <10 <10 mg/dL    Comment: (NOTE) Lowest detectable limit for serum alcohol is 10 mg/dL.  For medical purposes only. Performed at [facility], [address] [place], [place] [address]   CBC WITH DIFFERENTIAL     Status: Abnormal   Collection Time: 11/10/20  3:51 PM  Result Value Ref Range   WBC 15.3 (H) 4.0 - 10.5 K/uL   RBC 5.37  4.22 - 5.81 MIL/uL   Hemoglobin 16.6 13.0 - 17.0 g/dL   HCT 86.5 78.4 - 69.6 %   MCV 89.6 80.0 - 100.0 fL   MCH 30.9 26.0 - 34.0 pg   MCHC 34.5 30.0 - 36.0 g/dL   RDW 29.5 28.4 - 13.2 %   Platelets 312 150 - 400 K/uL   nRBC 0.0 0.0 - 0.2 %   Neutrophils Relative % 79 %   Neutro Abs 12.1 (H) 1.7 - 7.7 K/uL   Lymphocytes Relative 8 %   Lymphs Abs 1.2 0.7 - 4.0 K/uL   Monocytes Relative 12 %   Monocytes Absolute 1.9 (H) 0.1 - 1.0 K/uL   Eosinophils Relative 0 %   Eosinophils Absolute 0.0 0.0 - 0.5 K/uL   Basophils Relative 0 %   Basophils Absolute 0.0 0.0 - 0.1 K/uL   Immature Granulocytes 1 %   Abs Immature Granulocytes 0.10 (H) 0.00 - 0.07 K/uL    Comment: Performed at [facility], [address] [place], [place] [address]  Troponin I (High Sensitivity)     Status: Abnormal   Collection Time: 11/10/20  3:51 PM  Result Value Ref Range   Troponin I (High Sensitivity) 1,848 (HH) <18 ng/L    Comment: CRITICAL RESULT CALLED TO, READ BACK BY AND VERIFIED WITH LEXIE OLIVER ON 11/10/20  SKL (NOTE) Elevated high sensitivity troponin I (hsTnI) values and significant  changes across serial measurements may suggest ACS but many other  chronic and acute conditions are known to elevate hsTnI results.  Refer to the "Links" section for chest pain algorithms and additional  guidance. Performed at [facility], [address] [place], [place] [address]   Urine Drug Screen, Qualitative     Status: Abnormal   Collection Time: 11/10/20  3:52 PM  Result Value Ref Range   Tricyclic, Ur Screen NONE DETECTED NONE DETECTED   Amphetamines, Ur Screen POSITIVE (A) NONE DETECTED   MDMA (Ecstasy)Ur Screen NONE DETECTED NONE DETECTED   Cocaine Metabolite,Ur [place] NONE DETECTED NONE DETECTED   Opiate, Ur Screen POSITIVE (A) NONE DETECTED   Phencyclidine (PCP) Ur S NONE DETECTED NONE DETECTED   Cannabinoid 50 Ng, Ur [place] POSITIVE (A) NONE DETECTED   Barbiturates, Ur Screen NONE  DETECTED NONE DETECTED   Benzodiazepine, Ur Scrn NONE DETECTED NONE DETECTED   Methadone Scn, Ur NONE DETECTED NONE DETECTED  Comment: (NOTE) Tricyclics + metabolites, urine    Cutoff 1000 ng/mL Amphetamines + metabolites, urine  Cutoff 1000 ng/mL MDMA (Ecstasy), urine              Cutoff 500 ng/mL Cocaine Metabolite, urine          Cutoff 300 ng/mL Opiate + metabolites, urine        Cutoff 300 ng/mL Phencyclidine (PCP), urine         Cutoff 25 ng/mL Cannabinoid, urine                 Cutoff 50 ng/mL Barbiturates + metabolites, urine  Cutoff 200 ng/mL Benzodiazepine, urine              Cutoff 200 ng/mL Methadone, urine                   Cutoff 300 ng/mL  The urine drug screen provides only a preliminary, unconfirmed analytical test result and should not be used for non-medical purposes. Clinical consideration and professional judgment should be applied to any positive drug screen result due to possible interfering substances. A more specific alternate chemical method must be used in order to obtain a confirmed analytical result. Gas chromatography / mass spectrometry (GC/MS) is the preferred confirm atory method. Performed at [facility], [address] [place], [place] [address]   CK     Status: Abnormal   Collection Time: 11/10/20  4:30 PM  Result Value Ref Range   Total CK >50,000 (H) 49 - 397 U/L    Comment: RESULT CONFIRMED BY MANUAL DILUTION SKL Performed at [facility], [address] [place], [place] [address]   Urinalysis, Complete w Microscopic     Status: Abnormal   Collection Time: 11/10/20  4:55 PM  Result Value Ref Range   Color, Urine AMBER (A) YELLOW    Comment: BIOCHEMICALS MAY BE AFFECTED BY COLOR   APPearance CLOUDY (A) CLEAR   Specific Gravity, Urine 1.027 1.005 - 1.030   pH 6.0 5.0 - 8.0   Glucose, UA 50 (A) NEGATIVE mg/dL   Hgb urine dipstick MODERATE (A) NEGATIVE   Bilirubin Urine NEGATIVE NEGATIVE   Ketones, ur 5 (A)  NEGATIVE mg/dL   Protein, ur 426 (A) NEGATIVE mg/dL   Nitrite NEGATIVE NEGATIVE   Leukocytes,Ua NEGATIVE NEGATIVE   RBC / HPF 21-50 0 - 5 RBC/hpf   WBC, UA >50 (H) 0 - 5 WBC/hpf   Bacteria, UA MANY (A) NONE SEEN   Squamous Epithelial / LPF NONE SEEN 0 - 5   WBC Clumps PRESENT    Mucus PRESENT    Hyaline Casts, UA PRESENT    Granular Casts, UA PRESENT    Sperm, UA PRESENT     Comment: Performed at [facility], [address] [place], [place] [address]  Blood gas, arterial     Status: Abnormal   Collection Time: 11/10/20  5:20 PM  Result Value Ref Range   FIO2 0.35    Delivery systems VENTILATOR    Mode ASSIST CONTROL    VT 500 mL   LHR 16 resp/min   Peep/cpap 5.0 cm H20   pH, Arterial 7.40 7.350 - 7.450   pCO2 arterial 34 32.0 - 48.0 mmHg   pO2, Arterial 178 (H) 83.0 - 108.0 mmHg   Bicarbonate 21.1 20.0 - 28.0 mmol/L   O2 Saturation 99.6 %   Collection site RIGHT RADIAL    Sample type ARTERIAL DRAW    Allens  test (pass/fail) POSITIVE (A) PASS    Comment: Performed at [facility], [address] [place], [place] [address]  Troponin I (High Sensitivity)     Status: Abnormal   Collection Time: 11/10/20  5:36 PM  Result Value Ref Range   Troponin I (High Sensitivity) 2,806 (HH) <18 ng/L    Comment: CRITICAL VALUE NOTED. VALUE IS CONSISTENT WITH PREVIOUSLY REPORTED/CALLED VALUE.PMF (NOTE) Elevated high sensitivity troponin I (hsTnI) values and significant  changes across serial measurements may suggest ACS but many other  chronic and acute conditions are known to elevate hsTnI results.  Refer to the "Links" section for chest pain algorithms and additional  guidance. Performed at [facility], [address] [place], [place] [address]   Lactic acid, plasma     Status: Abnormal   Collection Time: 11/10/20  6:00 PM  Result Value Ref Range   Lactic Acid, Venous 3.0 (HH) 0.5 - 1.9 mmol/L    Comment: CRITICAL RESULT CALLED TO, READ BACK BY AND  VERIFIED WITH LEXIE OLIVER ON 11/10/20 AT 1835 QSD Performed at [facility], [address] [place], [place] [address]   Lactic acid, plasma     Status: Abnormal   Collection Time: 11/10/20  7:52 PM  Result Value Ref Range   Lactic Acid, Venous 3.0 (HH) 0.5 - 1.9 mmol/L    Comment: CRITICAL VALUE NOTED. VALUE IS CONSISTENT WITH PREVIOUSLY REPORTED/CALLED VALUE SKL Performed at [facility], [address] [place], [place] [address]    CT Head Wo Contrast  Result Date: 11/10/2020 CLINICAL DATA:  Altered mental status with non responsiveness EXAM: CT HEAD WITHOUT CONTRAST TECHNIQUE: Contiguous axial images were obtained from the base of the skull through the vertex without intravenous contrast. COMPARISON:  March 10, 2005 FINDINGS: Brain: Ventricles and sulci are normal in size and configuration. There is no intracranial mass, hemorrhage, extra-axial fluid collection, or midline shift. The brain parenchyma appears unremarkable. No appreciable acute infarct. Vascular: No hyperdense vessel. No appreciable vascular calcification. Skull: The bony calvarium appears intact. There is a small right frontal scalp hematoma. Sinuses/Orbits: There is mild mucosal thickening in the right maxillary antrum. There is mucosal thickening in several ethmoid air cells. There is opacification in portions of the right sphenoid sinus. Orbits appear symmetric bilaterally. Other: Mastoid air cells are clear. IMPRESSION: Normal appearing brain parenchyma. No mass, hemorrhage, or extra-axial fluid collection. Small right frontal scalp hematoma with underlying bony calvarium intact. Paranasal sinus disease at several sites noted. Electronically Signed   By: Bretta BangWilliam  Woodruff III M.D.   On: 11/10/2020 17:41   DG Chest Portable 1 View  Result Date: 11/10/2020 CLINICAL DATA:  Post intubation.  Patient found unresponsive. EXAM: PORTABLE CHEST 1 VIEW COMPARISON:  Chest radiograph 03/23/2016 FINDINGS:  Cardiomediastinal contours are within normal limits. The endotracheal tube tip is located approximately 3 cm above the carina. The nasogastric tube side port projects over the stomach. There is a tiny opacity projecting over the right lung base. The left lung is clear. No pneumothorax or large pleural effusion. No acute finding in the visualized skeleton. IMPRESSION: 1.  Appropriate placement of the endotracheal and nasogastric tubes. 2. Tiny opacity projecting over the right lung base, recommend attention on follow-up. Otherwise no acute finding. Electronically Signed   By: Emmaline KluverNancy  Ballantyne M.D.   On: 11/10/2020 16:40    Review of Systems  Unable to perform ROS: Patient unresponsive    Blood pressure (!) 128/91, pulse 87, temperature (!) 96.08 F (  35.6 C), temperature source Bladder, resp. rate 16, SpO2 100 %. Physical Exam Vitals reviewed.  Constitutional:      Comments: Sedated on vent  HENT:     Head: Normocephalic.     Comments: Bruising/erythema on forehead    Right Ear: Tympanic membrane normal.     Left Ear: Tympanic membrane normal.     Nose: Nose normal.     Mouth/Throat:     Comments: ET tube in place Eyes:     Pupils: Pupils are equal, round, and reactive to light.  Cardiovascular:     Rate and Rhythm: Normal rate and regular rhythm.     Pulses: Normal pulses.     Heart sounds: Normal heart sounds.  Pulmonary:     Effort: Pulmonary effort is normal.     Breath sounds: Normal breath sounds. No wheezing or rhonchi.     Comments: Vent: PRVC, RR 16, TV 500, PEEP 5, FIO2 35% Abdominal:     General: There is no distension.     Palpations: Abdomen is soft.  Musculoskeletal:     Cervical back: Neck supple.     Right lower leg: No edema.     Left lower leg: No edema.  Skin:    General: Skin is warm and dry.     Comments: Multiple areas of skin changes present on admission: Head- erythema on forehead  Beneath arms: wheal like rash left upper chest and into the arm pits,  same rash in right arm pit Back: scratch mark Lower extremities: Knees erythema ? psoriasis like rash with small needle holes on right knee. Left ankle monitoring device with area of erythema just above device. Bilateral inner upper thigh rash Bilateral inner thighs- old healed lesions from pit bull bite attack   Neurological:     Comments: Sedated, RASS -4      Assessment/Plan 1. Poly substance overdose secondary to chronic abuse Drug screen positive for Amphetamine, opiates and THC Plan:Propofol, Fentanyl, Thiamine, withdrawal precaution. Target gulose management.  2. Seizure like activity R/o status epilepticus- concern as patient had rigid like movements at [facility] with elevated lactic acid. Plan: Keppra, Propofol, continuous EEG. Stop Propofol once EEG has started recording for accuracy. Will recheck lactic acid.  3. Acute kidney injury with non traumatic rhabdomyolysis  Plan: hydrate and trend renal function. BMP pending- will consider bicarbonate drip for urine alkalization  4. Demand ischemia vs effect of renal failure and CK levels Plan: trend troponin.  5. Acute hypoxic respiratory failure secondary to airway compromise Plan: lung protective vent protocol. SBT when appropriate.  6. Probable hypoxic brain injury secondary to drug overdose and delayed medical intervention Plan: If EEG negative, consider MRI brain.  7. Hypovolemia Plan: hydration  8. Lactic acidosis secondary to hypovolemia and ?seizure activity Plan: trend to resolution   Drips: Propofol, LR Lines: PIVs Prophylaxis: Protonix, SQ heparin, SCDs Foley: yes Nutrition: Nepro  Code status: FULL  Family: I have updated the patient's mother on his condition and the goals of therapy.  Thank you for allowing me the privilege to care for this patient.    I have dedicated a total of 60 minutes in critical care time minus all appropriate exclusions.  Annett Fabian, MD 11/10/2020, 11:41 PM

## 2020-11-10 NOTE — Progress Notes (Signed)
Patient received on unit.

## 2020-11-10 NOTE — ED Notes (Signed)
30 mg of etomidate IV and 30 mg of rocuronium IV at this time

## 2020-11-10 NOTE — ED Notes (Signed)
EMTALA checked for completion  

## 2020-11-10 NOTE — ED Notes (Signed)
Left knee appears red, scratches on arms and legs noted on arrival.

## 2020-11-10 NOTE — Progress Notes (Signed)
Communication took place regarding pt was a Sepsis Protocol at [facility], have asked bedside RN to ask provider about repeating lactic acid orders

## 2020-11-10 NOTE — ED Notes (Signed)
Report given to [facility]- ETA [id number]

## 2020-11-10 NOTE — Consult Note (Signed)
CODE SEPSIS - PHARMACY COMMUNICATION  **Broad Spectrum Antibiotics should be administered within 1 hour of Sepsis diagnosis**  Time Code Sepsis Called/Page Received: 1635  Antibiotics Ordered:  Vancomycin 1g IV x1 Cefepime 2g IV x1 Metronidazole 500mg  IV x1  Time of 1st antibiotic administration: 1733  , PharmD Pharmacy Resident  11/10/2020 4:39 PM

## 2020-11-10 NOTE — ED Notes (Signed)
BGL 130 at this time. Ativan 1 mg IV given at this time.

## 2020-11-10 NOTE — ED Notes (Signed)
Assisting primary RN, 1 bottle of propofol handed off to [facility] team for sedation during transport. Primary RN, made aware

## 2020-11-10 NOTE — Sepsis Progress Note (Signed)
[facility]ink is following the code sepsis

## 2020-11-10 NOTE — ED Notes (Signed)
Mother Okey Regal agrees to send pt to [facility] per conversation with EDP. Secretary aware.

## 2020-11-10 NOTE — ED Notes (Signed)
1mg ativan given at this time.

## 2020-11-10 NOTE — ED Notes (Signed)
Mother requesting pt be transferred to [facility], MD notified.

## 2020-11-10 NOTE — ED Triage Notes (Addendum)
Per EMS, pt found at home unresponsive on floor, unresponsive to 4 of narcan. Pt diaphoretic, pupils 59mm bilaterally, pt localizes to pain, pt bagged by EMS. BGL 140 per EMS. EMS suspect meth use.

## 2020-11-11 ENCOUNTER — Inpatient Hospital Stay (HOSPITAL_COMMUNITY): Payer: Self-pay

## 2020-11-11 ENCOUNTER — Encounter (HOSPITAL_COMMUNITY): Payer: Self-pay | Admitting: Internal Medicine

## 2020-11-11 DIAGNOSIS — Z9911 Dependence on respirator [ventilator] status: Secondary | ICD-10-CM

## 2020-11-11 LAB — COMPREHENSIVE METABOLIC PANEL
ALT: 280 U/L — ABNORMAL HIGH (ref 0–44)
AST: 672 U/L — ABNORMAL HIGH (ref 15–41)
Albumin: 3.5 g/dL (ref 3.5–5.0)
Alkaline Phosphatase: 75 U/L (ref 38–126)
Anion gap: 11 (ref 5–15)
BUN: 25 mg/dL — ABNORMAL HIGH (ref 6–20)
CO2: 22 mmol/L (ref 22–32)
Calcium: 8.3 mg/dL — ABNORMAL LOW (ref 8.9–10.3)
Chloride: 103 mmol/L (ref 98–111)
Creatinine, Ser: 2.38 mg/dL — ABNORMAL HIGH (ref 0.61–1.24)
GFR, Estimated: 37 mL/min — ABNORMAL LOW (ref >60)
Glucose, Bld: 104 mg/dL — ABNORMAL HIGH (ref 70–99)
Potassium: 4.2 mmol/L (ref 3.5–5.1)
Sodium: 136 mmol/L (ref 135–145)
Total Bilirubin: 1.1 mg/dL (ref 0.3–1.2)
Total Protein: 6.7 g/dL (ref 6.5–8.1)

## 2020-11-11 LAB — GLUCOSE, CAPILLARY
Glucose-Capillary: 108 mg/dL — ABNORMAL HIGH (ref 70–99)
Glucose-Capillary: 121 mg/dL — ABNORMAL HIGH (ref 70–99)
Glucose-Capillary: 135 mg/dL — ABNORMAL HIGH (ref 70–99)
Glucose-Capillary: 143 mg/dL — ABNORMAL HIGH (ref 70–99)
Glucose-Capillary: 155 mg/dL — ABNORMAL HIGH (ref 70–99)
Glucose-Capillary: 98 mg/dL (ref 70–99)

## 2020-11-11 LAB — CK: Total CK: 47139 U/L — ABNORMAL HIGH (ref 49–397)

## 2020-11-11 LAB — CBC
HCT: 48.3 % (ref 39.0–52.0)
Hemoglobin: 16.6 g/dL (ref 13.0–17.0)
MCH: 30.9 pg (ref 26.0–34.0)
MCHC: 34.4 g/dL (ref 30.0–36.0)
MCV: 89.8 fL (ref 80.0–100.0)
Platelets: 224 10*3/uL (ref 150–400)
RBC: 5.38 MIL/uL (ref 4.22–5.81)
RDW: 12.7 % (ref 11.5–15.5)
WBC: 9.7 10*3/uL (ref 4.0–10.5)
nRBC: 0 % (ref 0.0–0.2)

## 2020-11-11 LAB — HIV ANTIBODY (ROUTINE TESTING W REFLEX): HIV Screen 4th Generation wRfx: NONREACTIVE

## 2020-11-11 LAB — PHOSPHORUS: Phosphorus: 4.8 mg/dL — ABNORMAL HIGH (ref 2.5–4.6)

## 2020-11-11 LAB — LACTIC ACID, PLASMA
Lactic Acid, Venous: 2 mmol/L (ref 0.5–1.9)
Lactic Acid, Venous: 1.5 mmol/L (ref 0.5–1.9)

## 2020-11-11 LAB — TRIGLYCERIDES: Triglycerides: 102 mg/dL (ref <150)

## 2020-11-11 LAB — LIPASE, BLOOD: Lipase: 25 U/L (ref 11–51)

## 2020-11-11 LAB — HEMOGLOBIN A1C
Hgb A1c MFr Bld: 5.1 % (ref 4.8–5.6)
Mean Plasma Glucose: 99.67 mg/dL

## 2020-11-11 LAB — MAGNESIUM: Magnesium: 2.2 mg/dL (ref 1.7–2.4)

## 2020-11-11 LAB — TROPONIN I (HIGH SENSITIVITY): Troponin I (High Sensitivity): 6533 ng/L (ref <18)

## 2020-11-11 LAB — MRSA PCR SCREENING: MRSA by PCR: NEGATIVE

## 2020-11-11 MED ORDER — ASPIRIN 325 MG PO TABS
325.0000 mg | ORAL_TABLET | Freq: Every day | ORAL | Status: DC
  Administered 2020-11-11 – 2020-11-13 (×3): 325 mg via NASOGASTRIC
  Filled 2020-11-11 (×3): qty 1

## 2020-11-11 MED ORDER — CHLORHEXIDINE GLUCONATE 0.12% ORAL RINSE (MEDLINE KIT)
15.0000 mL | Freq: Two times a day (BID) | OROMUCOSAL | Status: DC
  Administered 2020-11-11 – 2020-11-21 (×22): 15 mL via OROMUCOSAL

## 2020-11-11 MED ORDER — VITAL HIGH PROTEIN PO LIQD: 1000.0000 mL | ORAL | Status: DC

## 2020-11-11 MED ORDER — IPRATROPIUM-ALBUTEROL 0.5-2.5 (3) MG/3ML IN SOLN
3.0000 mL | Freq: Four times a day (QID) | RESPIRATORY_TRACT | Status: DC | PRN
  Administered 2020-11-21: 3 mL via RESPIRATORY_TRACT
  Filled 2020-11-11: qty 3

## 2020-11-11 MED ORDER — ENOXAPARIN SODIUM 80 MG/0.8ML IJ SOSY
1.0000 mg/kg | PREFILLED_SYRINGE | Freq: Two times a day (BID) | INTRAMUSCULAR | Status: DC
  Administered 2020-11-11 – 2020-11-12 (×3): 77.5 mg via SUBCUTANEOUS
  Filled 2020-11-11 (×3): qty 0.8

## 2020-11-11 MED ORDER — NEPRO/CARBSTEADY PO LIQD
1000.0000 mL | ORAL | Status: DC
  Administered 2020-11-11 – 2020-11-12 (×2): 1000 mL
  Filled 2020-11-11 (×2): qty 1000

## 2020-11-11 MED ORDER — ORAL CARE MOUTH RINSE
15.0000 mL | OROMUCOSAL | Status: DC
  Administered 2020-11-11 – 2020-11-21 (×105): 15 mL via OROMUCOSAL

## 2020-11-11 MED ORDER — LACTATED RINGERS IV BOLUS
1000.0000 mL | Freq: Once | INTRAVENOUS | Status: AC
  Administered 2020-11-11: 1000 mL via INTRAVENOUS

## 2020-11-11 MED ORDER — THIAMINE HCL 100 MG PO TABS
100.0000 mg | ORAL_TABLET | Freq: Every day | ORAL | Status: DC
  Administered 2020-11-12 – 2020-11-21 (×10): 100 mg
  Filled 2020-11-11 (×10): qty 1

## 2020-11-11 MED ORDER — SODIUM BICARBONATE BOLUS VIA INFUSION: INTRAVENOUS | Status: DC

## 2020-11-11 MED ORDER — LACTATED RINGERS IV SOLN: INTRAVENOUS | Status: DC

## 2020-11-11 MED ORDER — FOLIC ACID 1 MG PO TABS
1.0000 mg | ORAL_TABLET | Freq: Every day | ORAL | Status: DC
  Administered 2020-11-11 – 2020-11-21 (×11): 1 mg
  Filled 2020-11-11 (×11): qty 1

## 2020-11-11 MED ORDER — LEVETIRACETAM IN NACL 1000 MG/100ML IV SOLN
1000.0000 mg | Freq: Once | INTRAVENOUS | Status: AC
  Administered 2020-11-11: 1000 mg via INTRAVENOUS
  Filled 2020-11-11: qty 100

## 2020-11-11 MED ORDER — THIAMINE HCL 100 MG PO TABS: 100.0000 mg | ORAL_TABLET | Freq: Every day | ORAL | Status: DC

## 2020-11-11 MED ORDER — SODIUM BICARBONATE 8.4 % IV SOLN
INTRAVENOUS | Status: DC
  Filled 2020-11-11 (×5): qty 1000

## 2020-11-11 MED ORDER — ADULT MULTIVITAMIN LIQUID CH
15.0000 mL | Freq: Every day | ORAL | Status: DC
  Administered 2020-11-11 – 2020-11-12 (×2): 15 mL
  Filled 2020-11-11 (×2): qty 15

## 2020-11-11 MED ORDER — CHLORHEXIDINE GLUCONATE CLOTH 2 % EX PADS
6.0000 | MEDICATED_PAD | Freq: Every day | CUTANEOUS | Status: DC
  Administered 2020-11-12 – 2020-12-03 (×21): 6 via TOPICAL

## 2020-11-11 MED ORDER — SODIUM BICARBONATE 8.4 % IV SOLN: INTRAVENOUS | Status: DC

## 2020-11-11 NOTE — Progress Notes (Signed)
NAME:  Nathaniel Lynn, MRN:  [id number], DOB:  1991/02/01, LOS: 1 ADMISSION DATE:  11/10/2020, CONSULTATION DATE:  11/11/20 REFERRING MD:  Valeria Batman, CHIEF COMPLAINT:   hypoxia  History of Present Illness:  Mr. Nathaniel Cicio. Lynn is a 30 year old young man with PMH significant for narcotic drug abuse.  The patient's mother reported that the patient had been released from prison 2 weeks ago after serving a 2 year sentence.  Since his release, he has been involved in drug abuse with his girlfriend.    The patient's mother reported that her daughter called her around 69 AM reporting that she was unable to reach her brother as he was not answering his phone.  She told her mother that the patient's probation officer called her to report that the patient's ankle monitoring device had sounded an alarm and the probation officer was unable to reach her brother. The daughter tried several times and when she could not reach her brother she called her mother at 85 AM who was already at work.   The sister was finally able to get another family member to go to the patient's girlfriend's house in search of her brother.  When she arrived around 3 pm, she found her brother unresponsive.   The girlfriend confirmed that they were getting "high". The girlfriend reported that she had given the patient narcan but she was still unable to arouse him.  Hence at that time 911 was called and the patient was taken to [facility] for evaluation. It is reported that the initial blood sugar was 140 in the field. [name] also documented that patient initially localized to painful stimuli.  The patient's toxicology screen came back positive for Amphetamines, opiates and cannabinoid. He was found to have acute kidney injury and CK level was > 50 thousand.  CT head was negative for acute pathology and lactic acid was elevated.  While in the ED, the patient was given Ativan x 2 for tonic-clonic seizure like activity.  The patient was  intubated shortly after seizure like activity was noted.  He was also found to be " posturing" therefore the neurologist recommended transfer to [facility] for continuous EEG monitoring.  I have seen and examine the patient.  He is unable to contribute to this consultation.  Hence this consultation was done with the assistance of the patient's mother and review of [facility] records.  Pertinent  Medical History  Drug abuse  Significant Hospital Events: Including procedures, antibiotic start and stop dates in addition to other pertinent events   . 5/14 admitted, intubated, transferred to [facility]. Started on empiric abx- vanc, cefepime, flagyl  Interim History / Subjective:  Remains intubated, sedated on propofol.  Objective   Blood pressure 123/86, pulse 93, temperature (!) 97.52 F (36.4 C), resp. rate 16, SpO2 100 %.    Vent Mode: PRVC FiO2 (%):  [35 %] 35 % Set Rate:  [16 bmp] 16 bmp Vt Set:  [500 mL] 500 mL PEEP:  [5 cmH20] 5 cmH20 Plateau Pressure:  [14 cmH20] 14 cmH20   Intake/Output Summary (Last 24 hours) at 11/11/2020 0727 Last data filed at 11/11/2020 0600 Gross per 24 hour  Intake 1193.57 ml  Output --  Net 1193.57 ml   There were no vitals filed for this visit.  Examination: General: critically ill appearing man lying in bed in NAD, intubated, sedated HENT: Abrasion to left forehead, mild conjunctival injection Lungs: CTAB, no tracheal secretions Cardiovascular: S1S2, RRR Abdomen: soft, NT Extremities:  healed skin popping scars on lfet thigh, ankle tracking device R ankle with mild area of erythema and edema just distal to it on his shin. Tattoos. No rashes or ecchymoses. Neuro: RASS -5, pinpoint pupils GU: foley draining small volume of tea colored urine  Labs/imaging that I havepersonally reviewed  (right click and "Reselect all SmartList Selections" daily)   BUN/Cr 25/2.38 LA 3>2>1.5 CK >50k>> 47K Trop 6533  UDS: amphetamines, THC, opiates  Blood culture (1 site)  pending HIV pending CXR personally reviewed> ETT 4 cm above carina, no opacities  Resolved Hospital Problem list   Lactic acidosis  Assessment & Plan:  Acute metabolic encephalopathy due to drug overdose Seizure- likely precipitated by stimulant use -remains intubated; needs wakeup trial this morning. If not waking up, needs Neuro consult and EEG  Acute respiratory failure with hypoxia -LTVV, 4-8cc/kg IBW with goal Pplat<30 and DP<15 -VAP prevention protocol -PAD protocol for sedation; goal 0 to -1 -daily SAT & SBT -titrate down FiO2 as able to maintain SpO2 >90%  AKI due to rhabdo -1L LR bolus -con't bicarb gtt -strict I/Os -renally dose meds, avoid nephrotoxic meds -con't to monitor CK  Trop elevation, likely due to rhabdo -echo pending -tele monitoring -con't aspirin; no statin due to rhabdo  Possible sepsis -con't empiric antibiotics -follow culture until finalized  IVDU -HIV pending -will counsel to avoid this when stable  Elevated transaminases, likely 2/2 rhabdo Best practice (right click and "Reselect all SmartList Selections" daily)  Diet:  Tube Feed  Pain/Anxiety/Delirium protocol (if indicated): Yes (RASS goal -1) VAP protocol (if indicated): Yes DVT prophylaxis: LMWH GI prophylaxis: PPI Glucose control:  SSI Yes Central venous access:  N/A Arterial line:  N/A Foley:  Yes, and it is still needed Mobility:  bed rest  PT consulted: N/A Last date of multidisciplinary goals of care discussion [ ]  Code Status:  full code Disposition: ICU  Labs   CBC: Recent Labs  Lab 11/10/20 1551 11/11/20 0131  WBC 15.3* 9.7  NEUTROABS 12.1*  --   HGB 16.6 16.6  HCT 48.1 48.3  MCV 89.6 89.8  PLT 312 224    Basic Metabolic Panel: Recent Labs  Lab 11/10/20 1551 11/11/20 0131  NA 137 136  K 4.8 4.2  CL 97* 103  CO2 23 22  GLUCOSE 126* 104*  BUN 24* 25*  CREATININE 2.33* 2.38*  CALCIUM 9.0 8.3*  MG  --  2.2  PHOS  --  4.8*   GFR: Estimated  Creatinine Clearance: 47.7 mL/min (A) (by C-G formula based on SCr of 2.38 mg/dL (H)). Recent Labs  Lab 11/10/20 1551 11/10/20 1800 11/10/20 1952 11/11/20 0131 11/11/20 0436  WBC 15.3*  --   --  9.7  --   LATICACIDVEN  --  3.0* 3.0* 2.0* 1.5    Liver Function Tests: Recent Labs  Lab 11/10/20 1551 11/11/20 0131  AST 669* 672*  ALT 265* 280*  ALKPHOS 93 75  BILITOT 1.1 1.1  PROT 8.8* 6.7  ALBUMIN 4.8 3.5   Recent Labs  Lab 11/11/20 0131  LIPASE 25   No results for input(s): AMMONIA in the last 168 hours.  ABG    Component Value Date/Time   PHART 7.40 11/10/2020 1720   PCO2ART 34 11/10/2020 1720   PO2ART 178 (H) 11/10/2020 1720   HCO3 21.1 11/10/2020 1720   O2SAT 99.6 11/10/2020 1720     Coagulation Profile: No results for input(s): INR, PROTIME in the last 168 hours.  Cardiac Enzymes: Recent  Labs  Lab 11/10/20 1630 11/11/20 0131  CKTOTAL >50,000* 47,139*    HbA1C: Hgb A1c MFr Bld  Date/Time Value Ref Range Status  11/11/2020 01:31 AM 5.1 4.8 - 5.6 % Final    Comment:    (NOTE) Pre diabetes:          5.7%-6.4%  Diabetes:              >6.4%  Glycemic control for   <7.0% adults with diabetes     CBG: Recent Labs  Lab 11/10/20 1544 11/10/20 2355 11/11/20 0317  GLUCAP 130* 97 98    This patient is critically ill with multiple organ system failure which requires frequent high complexity decision making, assessment, support, evaluation, and titration of therapies. This was completed through the application of advanced monitoring technologies and extensive interpretation of multiple databases. During this encounter critical care time was devoted to patient care services described in this note for 45 minutes.  Steffanie Dunn, DO 11/11/20 8:24 AM [facility]

## 2020-11-11 NOTE — Progress Notes (Signed)
Routine EEG completed, LTM in progress, result pending.

## 2020-11-11 NOTE — Plan of Care (Signed)
Pt is on continuous EEG monitoring, sedation is turned off completely. Pt remains in restraints per MD request in case of abrupt awakening and potential attempts to pull out equipment and have an injury.

## 2020-11-11 NOTE — Progress Notes (Signed)
eLink Physician-Brief Progress Note Patient Name: Nathaniel Lynn DOB: April 23, 1991 MRN: [id number]   Date of Service  11/11/2020  HPI/Events of Note  Temp 101.1  blood culture x 1 done last evening at 23:20,    RN asking for cooling blanket and tylenol but liver enzymes elevated   on vent with PIV's     Cr 2.38  eICU Interventions  Ordered above. No tylenol  No tylenol for now. In MOF. Notes reviewed.      Intervention Category Intermediate Interventions: Other:  Ranee Gosselin 11/11/2020, 8:20 PM

## 2020-11-11 NOTE — Procedures (Signed)
Patient Name: Nathaniel Lynn  MRN: [id number]  Epilepsy Attending: Charlsie Quest  Referring Physician/Provider: Dr Jackson Latino Date: 11/11/2020 Duration: 24.39 mins  Patient history: 30 y.o. male with PMH significant for IVDU who presents with unresponsiveness in the setting of amphetamine use. EEG to evaluate for seizure  Level of alertness: comatose  AEDs during EEG study: Propofol  Technical aspects: This EEG study was done with scalp electrodes positioned according to the 10-20 International system of electrode placement. Electrical activity was acquired at a sampling rate of 500Hz  and reviewed with a high frequency filter of 70Hz  and a low frequency filter of 1Hz . EEG data were recorded continuously and digitally stored.   Description: EEG showed continuous generalized polymorphic mixed frequencies with predominantly 2-3Hz  delta slowing admixed with generalized 8-9hz  alpha activity as well as frontocentral 15-18Hz  beta activity. Hyperventilation and photic stimulation were not performed.     ABNORMALITY - Continuous slow, generalized  IMPRESSION: This study is suggestive of moderate to severe diffuse encephalopathy, nonspecific etiology but likely related to sedation. No seizures or epileptiform discharges were seen throughout the recording.   Blair Mesina 

## 2020-11-11 NOTE — Progress Notes (Addendum)
Mr. Fatima was examined again after ~20 min off propofol.  RASS -5, reactive pupils, no corneal or doll's eyes reflexes. Biting on ETT and oral suction catheter, no pharyngeal gag but +tracheal cough reflex. No withdrawal from pain in extremities. No response to verbal stimulation.  Neurology consulted and will need cEEG. Propofol remaining off for now given slight improvement in exam and no seizure like activity currently.   I attempted to contact his mother but no answer. I updated his sister Baird Lyons over the phone.  Steffanie Dunn, DO 11/11/20 9:31 AM [facility]

## 2020-11-11 NOTE — Progress Notes (Signed)
eLink Physician-Brief Progress Note Patient Name: Nathaniel Lynn DOB: 25-Oct-1990 MRN: [id number]   Date of Service  11/11/2020  HPI/Events of Note  Notified of troponin increase from 2806 to 6533.  I have personally reviewed the EKG from 2346 hrs and there is no evidence of myocardial ischemia.   eICU Interventions  Troponin elevation likely due to combination of rhabdomyolysis (which is known to elevate troponin levels) + renal failure (same).  His IVF is only running at 100cc/hr. I will increase this to 228mL/hr for management of his rhabdomyolysis.      Intervention Category Intermediate Interventions: Other:  Nathaniel Lynn 11/11/2020, 3:24 AM

## 2020-11-11 NOTE — Consult Note (Signed)
NEUROLOGY CONSULTATION NOTE   Date of service: Nov 11, 2020 Patient Name: Nathaniel Lynn MRN:  [id number] DOB:  04/30/1991 Reason for consult: "Unresponsive" Requesting Provider: Steffanie Dunn, DO _ _ _   _ __   _ __ _ _  __ __   _ __   __ _  History of Present Illness  Nathaniel Lynn is a 30 y.o. male with PMH significant for IVDU who presents with unresponsiveness. Unclear sequence of events but not answering phone at 10AM on 11/10/20. Family eventually were able to get to his girlfriend's house at [facility] and found him passed out. Per notes, girlfriend confirmed they were getting high. Girlfriend gave him Narcan but still unable to arouse him. He was taken to [facility] by EMS. Found to have tachycardia with dilated pupils. Toxicology with amphetamines, opiates, cannabinoids, AKI with CK > 50,000 consistent with rhabdomyolysis. He was noted to have seizure like activity in the ED and Ativan 1mg  x 2 was given. He was intubated and noted to be posturing. CTH w/o contrast with no acute abnormality. He was transferred to [facility] for cEEG monitoring.    ROS   Unable to obtain due to intubation and sedation.  Past History   Past Medical History:  Diagnosis Date  . IVDU (intravenous drug user)    History reviewed. No pertinent surgical history. History reviewed. No pertinent family history. Social History   Socioeconomic History  . Marital status: Single    Spouse name: Not on file  . Number of children: Not on file  . Years of education: Not on file  . Highest education level: Not on file  Occupational History  . Not on file  Tobacco Use  . Smoking status: Unknown If Ever Smoked  . Smokeless tobacco: Not on file  Substance and Sexual Activity  . Alcohol use: Not on file  . Drug use: Not on file  . Sexual activity: Not on file  Other Topics Concern  . Not on file  Social History Narrative  . Not on file   Social Determinants of Health   Financial Resource Strain: Not on file   Food Insecurity: Not on file  Transportation Needs: Not on file  Physical Activity: Not on file  Stress: Not on file  Social Connections: Not on file   Allergies  Allergen Reactions  . Penicillins     Pt states he is allergic to penicillin. He said he was told that but he does not know what the reaction is.    Medications   No medications prior to admission.     Vitals   Vitals:   11/11/20 0600 11/11/20 0700 11/11/20 0726 11/11/20 0800  BP: 123/86 123/86 123/86   Pulse: 93 94 96 98  Resp: 16 16 16 16   Temp: (!) 97.52 F (36.4 C) (!) 97.52 F (36.4 C) (!) 97.52 F (36.4 C) 97.88 F (36.6 C)  TempSrc:      SpO2: 100% 99% 99%      There is no height or weight on file to calculate BMI.  Physical Exam   General: Laying comfortably in bed; intubated. HENT: Normal oropharynx and mucosa. Normal external appearance of ears and nose. Neck: Supple, no pain or tenderness CV: No JVD. No peripheral edema. Pulmonary: Symmetric Chest rise. Breathing over vent. Abdomen: Soft to touch, non-tender. Ext: No cyanosis, edema, or deformity Skin: No rash. Normal palpation of skin.  Musculoskeletal: Normal digits and nails by inspection. No clubbing.  Neurologic  Examination off propofol for an hour  Mental status/Cognition: No response to voice, loud clap or noxious stimuli. Grimaces to nare stimulation with a Qtip.  Brainstem: Pupils 89mm, round and reactive to light. Dysconjugate gaze with R eye deviated upwards. Corneals: + BL Gag: absent Cough: positive Dolls eyes: negative (eyes move with head)  Symmetric upper facial grimace noted to nares stimulation Minimal movement noted in RUE to nares stimualton.   Motor/sensory:  Muscle bulk: normal, tone flaccid in all extremities. No movement to pinch or nailbed pressure in any extremity. Did have minimal RUE movement to nares stimulation.  Reflexes:  Right Left Comments  Pectoralis      Biceps (C5/6) 1 1   Brachioradialis  (C5/6) 1 1    Triceps (C6/7) 1 1    Patellar (L3/4) 1 1    Achilles (S1)      Hoffman      Plantar     Jaw jerk    Coordiantion: Unable to assess  Labs   CBC:  Recent Labs  Lab 11/10/20 1551 11/11/20 0131  WBC 15.3* 9.7  NEUTROABS 12.1*  --   HGB 16.6 16.6  HCT 48.1 48.3  MCV 89.6 89.8  PLT 312 224    Basic Metabolic Panel:  Lab Results  Component Value Date   NA 136 11/11/2020   K 4.2 11/11/2020   CO2 22 11/11/2020   GLUCOSE 104 (H) 11/11/2020   BUN 25 (H) 11/11/2020   CREATININE 2.38 (H) 11/11/2020   CALCIUM 8.3 (L) 11/11/2020   GFRNONAA 37 (L) 11/11/2020   Lipid Panel: No results found for: LDLCALC HgbA1c:  Lab Results  Component Value Date   HGBA1C 5.1 11/11/2020   Urine Drug Screen:     Component Value Date/Time   LABOPIA POSITIVE (A) 11/10/2020 1552   COCAINSCRNUR NONE DETECTED 11/10/2020 1552   LABBENZ NONE DETECTED 11/10/2020 1552   AMPHETMU POSITIVE (A) 11/10/2020 1552   THCU POSITIVE (A) 11/10/2020 1552   LABBARB NONE DETECTED 11/10/2020 1552    Alcohol Level     Component Value Date/Time   ETH <10 11/10/2020 1551    CT Head without contrast: Personally reviewed and CTH was negative for a large hypodensity concerning for a large territory infarct or hyperdensity concerning for an ICH  MRI Brain: After cEEG  cEEG:  Pending  Significant labs reviewed: CK: 47,139 Trop I: 6,533 AST: 672 ALT: 280 Creatinine: 2.38. Glucose: 108. Lactic Acid: 1.5. UA: not concerning for an infection UD: positive for amphetamines, Cannabinoids, Opiates   Impression   Nathaniel Lynn is a 30 y.o. male with PMH significant for IVDU who presents with unresponsiveness in the setting of amphetamine, . His neurologic examination is notable for obtunded mentation despite being off sedation with intact brainstem reflexes. CTH w/o contrast with no acute abnormality.   He was noted to have GTCin the ED and I think reasonable to get a cEEG. Will load him up  with Keppra until cEEG.  Amphetamine overdose can cause cardiac rhythm abnormalities and subsequent hypoxic injury, opiates can also cause respiratory depression which can lead to hypoxic injury.  Impression: - Acute metabolic encephalopathy due to drug overdose - AKI and Rhabdomyolysis in the setting of drug overdose - Provoked Generalized Tonic clonic seizure in the setting of drug overdose.  Recommendations  - Keppra 1000mg  IV once ordered. He was given Keppra 500mg  Iv once last night - cEEG pending - MRI Brain without contrast after cEEG - Seizure precautions - Ativan 2mg   for seizure activity lasting more than 2 mins.  ______________________________________________________________________  This patient is critically ill and at significant risk of neurological worsening, death and care requires constant monitoring of vital signs, hemodynamics,respiratory and cardiac monitoring, neurological assessment, discussion with family, other specialists and medical decision making of high complexity. I spent 35 minutes of neurocritical care time  in the care of  this patient. This was time spent independent of any time provided by nurse practitioner or PA.  Erick Blinks [facility] Pager Number [phone] 11/11/2020  11:56 AM  Thank you for the opportunity to take part in the care of this patient. If you have any further questions, please contact the neurology consultation attending.  Signed,  Erick Blinks [facility] Pager Number [phone] _ _ _   _ __   _ __ _ _  __ __   _ __   __ _

## 2020-11-11 NOTE — Progress Notes (Addendum)
Brief Nutrition Note RD working remotely.   Consult received for enteral/tube feeding initiation and management.  Adult Enteral Nutrition Protocol initiated. Full assessment to follow. Order already in place by MD for Nepro @ 20 ml/hr.   Admitting Dx: Status epilepticus (HCC) [G40.901]  There is no height or weight on file to calculate BMI.   Labs:  Recent Labs  Lab 11/10/20 1551 11/11/20 0131  NA 137 136  K 4.8 4.2  CL 97* 103  CO2 23 22  BUN 24* 25*  CREATININE 2.33* 2.38*  CALCIUM 9.0 8.3*  MG  --  2.2  PHOS  --  4.8*  GLUCOSE 126* 104*       Trenton Gammon, MS, RD, LDN, CNSC Inpatient Clinical Dietitian RD pager # available in AMION  After hours/weekend pager # available in [facility]

## 2020-11-11 NOTE — Progress Notes (Signed)
[employer] called to request ankle monitor be removed so patient can have an MRI. They stated they would notify the ankle monitoring people and would be back in contact.

## 2020-11-11 NOTE — Progress Notes (Signed)
Mother Okey Regal updated by phone.   MRI ordered- needs ankle bracelet removed before he can do this. RN Lyla Son looking into this.  Steffanie Dunn, DO 11/11/20 2:38 PM [facility]

## 2020-11-12 ENCOUNTER — Other Ambulatory Visit (HOSPITAL_COMMUNITY): Payer: Self-pay

## 2020-11-12 ENCOUNTER — Inpatient Hospital Stay (HOSPITAL_COMMUNITY): Payer: Self-pay

## 2020-11-12 ENCOUNTER — Inpatient Hospital Stay: Payer: Self-pay

## 2020-11-12 DIAGNOSIS — N179 Acute kidney failure, unspecified: Secondary | ICD-10-CM

## 2020-11-12 DIAGNOSIS — G9341 Metabolic encephalopathy: Secondary | ICD-10-CM

## 2020-11-12 DIAGNOSIS — G928 Other toxic encephalopathy: Secondary | ICD-10-CM

## 2020-11-12 DIAGNOSIS — I38 Endocarditis, valve unspecified: Secondary | ICD-10-CM

## 2020-11-12 LAB — BASIC METABOLIC PANEL
Anion gap: 9 (ref 5–15)
BUN: 31 mg/dL — ABNORMAL HIGH (ref 6–20)
CO2: 29 mmol/L (ref 22–32)
Calcium: 8.1 mg/dL — ABNORMAL LOW (ref 8.9–10.3)
Chloride: 98 mmol/L (ref 98–111)
Creatinine, Ser: 3.33 mg/dL — ABNORMAL HIGH (ref 0.61–1.24)
GFR, Estimated: 25 mL/min — ABNORMAL LOW (ref >60)
Glucose, Bld: 127 mg/dL — ABNORMAL HIGH (ref 70–99)
Potassium: 3.8 mmol/L (ref 3.5–5.1)
Sodium: 136 mmol/L (ref 135–145)

## 2020-11-12 LAB — ECHOCARDIOGRAM COMPLETE
Area-P 1/2: 4.15 cm2
Height: 70 in
S' Lateral: 3.1 cm
Weight: 2751.34 oz

## 2020-11-12 LAB — GLUCOSE, CAPILLARY
Glucose-Capillary: 131 mg/dL — ABNORMAL HIGH (ref 70–99)
Glucose-Capillary: 157 mg/dL — ABNORMAL HIGH (ref 70–99)
Glucose-Capillary: 120 mg/dL — ABNORMAL HIGH (ref 70–99)
Glucose-Capillary: 104 mg/dL — ABNORMAL HIGH (ref 70–99)
Glucose-Capillary: 104 mg/dL — ABNORMAL HIGH (ref 70–99)
Glucose-Capillary: 129 mg/dL — ABNORMAL HIGH (ref 70–99)

## 2020-11-12 LAB — CK: Total CK: 15401 U/L — ABNORMAL HIGH (ref 49–397)

## 2020-11-12 LAB — MAGNESIUM: Magnesium: 2.4 mg/dL (ref 1.7–2.4)

## 2020-11-12 LAB — PHOSPHORUS: Phosphorus: 4.2 mg/dL (ref 2.5–4.6)

## 2020-11-12 MED ORDER — ADULT MULTIVITAMIN W/MINERALS CH
1.0000 | ORAL_TABLET | Freq: Every day | ORAL | Status: DC
  Administered 2020-11-13 – 2020-11-21 (×9): 1
  Filled 2020-11-12 (×9): qty 1

## 2020-11-12 MED ORDER — LIDOCAINE HCL (PF) 1 % IJ SOLN
INTRAMUSCULAR | Status: AC
  Filled 2020-11-12: qty 5

## 2020-11-12 MED ORDER — VITAL 1.5 CAL PO LIQD
1000.0000 mL | ORAL | Status: DC
  Administered 2020-11-12 – 2020-11-21 (×10): 1000 mL
  Filled 2020-11-12 (×8): qty 1000

## 2020-11-12 MED ORDER — PROSOURCE TF PO LIQD
45.0000 mL | Freq: Two times a day (BID) | ORAL | Status: DC
  Administered 2020-11-12 – 2020-11-22 (×20): 45 mL
  Filled 2020-11-12 (×20): qty 45

## 2020-11-12 MED ORDER — SODIUM CHLORIDE 0.9% FLUSH
10.0000 mL | Freq: Two times a day (BID) | INTRAVENOUS | Status: DC
  Administered 2020-11-12: 40 mL
  Administered 2020-11-13 (×2): 10 mL
  Administered 2020-11-14 (×2): 20 mL
  Administered 2020-11-15: 10 mL
  Administered 2020-11-15: 20 mL
  Administered 2020-11-16 (×2): 10 mL
  Administered 2020-11-17: 20 mL
  Administered 2020-11-17 – 2020-11-18 (×3): 30 mL
  Administered 2020-11-19 – 2020-11-30 (×22): 10 mL
  Administered 2020-12-01: 30 mL
  Administered 2020-12-01: 10 mL
  Administered 2020-12-02: 30 mL
  Administered 2020-12-02 – 2020-12-05 (×3): 10 mL

## 2020-11-12 MED ORDER — POLYETHYLENE GLYCOL 3350 17 G PO PACK: 17.0000 g | PACK | Freq: Every day | ORAL | Status: DC | PRN

## 2020-11-12 MED ORDER — HYDRALAZINE HCL 20 MG/ML IJ SOLN
10.0000 mg | INTRAMUSCULAR | Status: AC | PRN
  Administered 2020-11-12 – 2020-11-14 (×3): 20 mg via INTRAVENOUS
  Filled 2020-11-12 (×4): qty 1

## 2020-11-12 MED ORDER — NICARDIPINE HCL IN NACL 20-0.86 MG/200ML-% IV SOLN
0.0000 mg/h | INTRAVENOUS | Status: DC
  Administered 2020-11-12: 5 mg/h via INTRAVENOUS
  Administered 2020-11-13: 7.5 mg/h via INTRAVENOUS
  Administered 2020-11-13: 2.5 mg/h via INTRAVENOUS
  Administered 2020-11-15: 5 mg/h via INTRAVENOUS
  Filled 2020-11-12 (×4): qty 200

## 2020-11-12 MED ORDER — VANCOMYCIN HCL 1750 MG/350ML IV SOLN
1750.0000 mg | Freq: Once | INTRAVENOUS | Status: AC
  Administered 2020-11-12: 1750 mg via INTRAVENOUS
  Filled 2020-11-12: qty 350

## 2020-11-12 MED ORDER — LIDOCAINE HCL (PF) 1 % IJ SOLN
INTRAMUSCULAR | Status: AC
  Administered 2020-11-12: 5 mL
  Filled 2020-11-12: qty 5

## 2020-11-12 MED ORDER — PANTOPRAZOLE SODIUM 40 MG PO PACK
40.0000 mg | PACK | Freq: Every day | ORAL | Status: DC
  Administered 2020-11-12 – 2020-11-21 (×10): 40 mg
  Filled 2020-11-12 (×10): qty 20

## 2020-11-12 MED ORDER — SODIUM CHLORIDE 0.9 % IV SOLN: INTRAVENOUS | Status: DC

## 2020-11-12 MED ORDER — ENOXAPARIN SODIUM 40 MG/0.4ML IJ SOSY
40.0000 mg | PREFILLED_SYRINGE | INTRAMUSCULAR | Status: DC
  Administered 2020-11-13: 40 mg via SUBCUTANEOUS
  Filled 2020-11-12: qty 0.4

## 2020-11-12 MED ORDER — DEXMEDETOMIDINE HCL IN NACL 400 MCG/100ML IV SOLN
0.2000 ug/kg/h | INTRAVENOUS | Status: DC
  Administered 2020-11-12: 0.4 ug/kg/h via INTRAVENOUS
  Administered 2020-11-13: 0.2 ug/kg/h via INTRAVENOUS
  Administered 2020-11-13: 0.8 ug/kg/h via INTRAVENOUS
  Filled 2020-11-12 (×3): qty 100

## 2020-11-12 MED ORDER — SODIUM CHLORIDE 0.9 % IV SOLN
INTRAVENOUS | Status: DC | PRN
  Administered 2020-11-12: 250 mL via INTRAVENOUS
  Administered 2020-11-14 – 2020-11-16 (×2): 500 mL via INTRAVENOUS
  Administered 2020-11-18: 250 mL via INTRAVENOUS
  Administered 2020-11-20 – 2020-11-26 (×2): 500 mL via INTRAVENOUS

## 2020-11-12 MED ORDER — SODIUM CHLORIDE 0.9 % IV SOLN
2.0000 g | Freq: Two times a day (BID) | INTRAVENOUS | Status: DC
  Administered 2020-11-12 – 2020-11-16 (×9): 2 g via INTRAVENOUS
  Filled 2020-11-12: qty 2
  Filled 2020-11-12: qty 20
  Filled 2020-11-12 (×2): qty 2
  Filled 2020-11-12 (×2): qty 20
  Filled 2020-11-12 (×4): qty 2

## 2020-11-12 MED ORDER — DOCUSATE SODIUM 50 MG/5ML PO LIQD: 100.0000 mg | Freq: Two times a day (BID) | ORAL | Status: DC | PRN

## 2020-11-12 MED ORDER — DEXTROSE 5 % IV SOLN
10.0000 mg/kg | Freq: Two times a day (BID) | INTRAVENOUS | Status: DC
  Administered 2020-11-12 – 2020-11-17 (×11): 780 mg via INTRAVENOUS
  Filled 2020-11-12 (×14): qty 15.6

## 2020-11-12 MED ORDER — SODIUM CHLORIDE 0.9 % IV BOLUS
1000.0000 mL | Freq: Once | INTRAVENOUS | Status: AC
  Administered 2020-11-12: 1000 mL via INTRAVENOUS

## 2020-11-12 MED ORDER — SODIUM CHLORIDE 0.9% FLUSH
10.0000 mL | INTRAVENOUS | Status: DC | PRN
Start: 2020-11-12 — End: 2020-12-07
  Administered 2020-11-19 – 2020-12-03 (×2): 10 mL

## 2020-11-12 MED ORDER — HYDRALAZINE HCL 20 MG/ML IJ SOLN
10.0000 mg | Freq: Four times a day (QID) | INTRAMUSCULAR | Status: DC | PRN
  Administered 2020-11-12: 10 mg via INTRAVENOUS
  Filled 2020-11-12: qty 1

## 2020-11-12 MED ORDER — VANCOMYCIN HCL 1000 MG/200ML IV SOLN
1000.0000 mg | INTRAVENOUS | Status: DC
  Administered 2020-11-13 – 2020-11-15 (×3): 1000 mg via INTRAVENOUS
  Filled 2020-11-12 (×3): qty 200

## 2020-11-12 NOTE — Progress Notes (Addendum)
eLink Physician-Brief Progress Note Patient Name: Nathaniel Lynn DOB: 1991-06-28 MRN: [id number]   Date of Service  11/12/2020  HPI/Events of Note  BP up to 208/88 despite increasing Hydralazine dose to 20 mg, heart rate 128, patient has reportedly been agitated per bedside RN, he does have a history of poly-substance abuse and could be withdrawing resulting in hypertension. Temp 100.6 and patient cannot get Tylenol or NSAIDS secondary to AKI / ALI.  eICU Interventions  Cardene infusion ordered for more precise BP control, Low dose Precedex ordered for possible withdrawal. Will order a cooling blanket PRN temp  = / > 101.        Thomasene Lot Llewellyn Schoenberger 11/12/2020, 11:23 PM

## 2020-11-12 NOTE — Progress Notes (Addendum)
Pharmacy Antibiotic Note  Nathaniel Lynn is a 30 y.o. male admitted on 11/10/2020 with r/o meningitis.  Pharmacy has been consulted for vancomycin/acyclovir dosing. Also started on ceftriaxone per MD. AKI - SCr trended up to 3.33 today.  Noted, patient received 1g dose of vancomycin on 5/14 PM.  Plan: Ceftriaxone 2g IV q12h per MD Vancomycin 1750mg  IV x 1; then 1g IV q24h. Goal trough 15-20 Acyclovir 780mg  (10mg /kg) IV q12h -Start NS at 150 ml/hr per CCM Monitor clinical progress, c/s, renal function, vancomycin levels as indicated F/u de-escalation plan/LOT     Temp (24hrs), Avg:99.9 F (37.7 C), Min:98.06 F (36.7 C), Max:101.12 F (38.4 C)  Recent Labs  Lab 11/10/20 1551 11/10/20 1800 11/10/20 1952 11/11/20 0131 11/11/20 0436 11/12/20 0132  WBC 15.3*  --   --  9.7  --   --   CREATININE 2.33*  --   --  2.38*  --  3.33*  LATICACIDVEN  --  3.0* 3.0* 2.0* 1.5  --     Estimated Creatinine Clearance: 34.1 mL/min (A) (by C-G formula based on SCr of 3.33 mg/dL (H)).    Allergies  Allergen Reactions  . Penicillins     Pt states he is allergic to penicillin. He said he was told that but he does not know what the reaction is.    11/13/20, PharmD, BCPS Please check AMION for all [facility] contact numbers Clinical Pharmacist 11/12/2020 9:01 AM

## 2020-11-12 NOTE — Progress Notes (Signed)
Red rash with pustules noted on patient's face, neck and torso. MD notified, RN will continue to monitor.

## 2020-11-12 NOTE — Progress Notes (Signed)
Called Mother of Nathaniel Lynn, Missey again at [phone]. No answer. Called sister of the patient Nathaniel Lynn at [phone]. Updated her on care and status of her brother.  Gershon Mussel., MSN, APRN, AGACNP-BC [facility]  11/12/2020 , 4:34 PM  Please see [email or web address] for pager details  If no response, please call [phone] After hours, please call Elink at [phone]

## 2020-11-12 NOTE — Progress Notes (Signed)
  Echocardiogram 2D Echocardiogram strain and 3D has been performed.  Leta Jungling M 11/12/2020, 10:23 AM

## 2020-11-12 NOTE — Progress Notes (Signed)
E-Link updated that patient's blood pressure remains above goal despite PRN hydralazine use. Awaiting new orders. Will continue to monitor.

## 2020-11-12 NOTE — Consult Note (Signed)
Neurology Progress Note   S:// Seen and examined. NAEO Remains unresponsive, even when off of fentanyl. Has rashes noted on trunk, face. Tmax 101.1 Lactate normalized. WBC this AM -Pending Cr 3.33 BUN 31 Trops uptrending. CK 47,139  O:// Current vital signs: BP (!) 153/99   Pulse 85   Temp 99.14 F (37.3 C)   Resp 16   SpO2 100%  Vital signs in last 24 hours: Temp:  [97.88 F (36.6 C)-101.12 F (38.4 C)] 99.14 F (37.3 C) (05/16 0752) Pulse Rate:  [78-114] 85 (05/16 0752) Resp:  [15-18] 16 (05/16 0752) BP: (123-165)/(86-108) 153/99 (05/16 0752) SpO2:  [97 %-100 %] 100 % (05/16 0752) FiO2 (%):  [30 %] 30 % (05/16 0752) GENERAL: intubated, sedated on fentanyl (was held briefly for exam) HEENT: - Normocephalic and atraumatic, dry mm, no LN++, no Thyromegally LUNGS - Clear to auscultation bilaterally with no wheezes CV - S1S2 RRR, no m/r/g, equal pulses bilaterally. ABDOMEN - Soft, nontender, nondistended with normoactive BS Ext: no edema, multiple rashes on skin of the face, arms and legs as well as torso NEURO:  Mental Status: intubated, sedated on fentanyl which was briefly held for exam. No response to voice or nox stim Language: see above Cranial Nerves: pupils 87m sluggish b/l, dysconjugate gaze, +ve corneals. Breathing over vent at times.facial symmetry difficult to assess Motor: no spontaneous movements, no movement to nox stim Tone: is normal and bulk is normal Sensation- see above Coordination and gait can not be assessed due to mentation  Medications  Current Facility-Administered Medications:  .  aspirin tablet 325 mg, 325 mg, Per NG tube, Daily, RLafayette Dragon MD, 325 mg at 11/11/20 1042 .  chlorhexidine gluconate (MEDLINE KIT) (PERIDEX) 0.12 % solution 15 mL, 15 mL, Mouth Rinse, BID, Stretch, RMarily Lente MD, 15 mL at 11/11/20 2030 .  Chlorhexidine Gluconate Cloth 2 % PADS 6 each, 6 each, Topical, Daily, CJulian Hy DO, 6 each at 11/12/20 0400 .   docusate (COLACE) 50 MG/5ML liquid 100 mg, 100 mg, Per Tube, BID, RLafayette Dragon MD, 100 mg at 11/11/20 2200 .  docusate sodium (COLACE) capsule 100 mg, 100 mg, Oral, BID PRN, RCammie SickleA, MD .  enoxaparin (LOVENOX) injection 77.5 mg, 1 mg/kg, Subcutaneous, Q12H, RLafayette Dragon MD, 77.5 mg at 11/11/20 2030 .  feeding supplement (NEPRO CARB STEADY) liquid 1,000 mL, 1,000 mL, Per Tube, Q24H, RLafayette Dragon MD, Last Rate: 20 mL/hr at 11/12/20 0115, 1,000 mL at 11/12/20 0115 .  fentaNYL 25023m in NS 25065m73m19ml) infusion-PREMIX, 25-100 mcg/hr, Intravenous, Continuous, RichLafayette Dragon, Last Rate: 10 mL/hr at 11/12/20 0600, 100 mcg/hr at 11/12/20 0600 .  folic acid (FOLVITE) tablet 1 mg, 1 mg, Per Tube, Daily, ClarNoemi ChapelDO, 1 mg at 11/11/20 1701 .  insulin aspart (novoLOG) injection 0-6 Units, 0-6 Units, Subcutaneous, Q4H, RichLafayette Dragon, 2 Units at 11/11/20 2030 .  ipratropium-albuterol (DUONEB) 0.5-2.5 (3) MG/3ML nebulizer solution 3 mL, 3 mL, Nebulization, Q6H PRN, RichLafayette Dragon .  LORazepam (ATIVAN) injection 2 mg, 2 mg, Intravenous, Q4H PRN, RichLafayette Dragon .  MEDLINE mouth rinse, 15 mL, Mouth Rinse, 10 times per day, Stretch, RobeMarily Lente, 15 mL at 11/12/20 0543 .  multivitamin liquid 15 mL, 15 mL, Per Tube, Daily, ClarNoemi ChapelDO, 15 mL at 11/11/20 1702 .  pantoprazole (PROTONIX) injection 40 mg, 40 mg, Intravenous, QHS, RichLafayette Dragon, 40 mg at 11/11/20 2210 .  polyethylene glycol (MIRALAX / GLYCOLAX) packet 17 g, 17 g, Oral, Daily PRN, Lafayette Dragon, MD .  polyethylene glycol (MIRALAX / GLYCOLAX) packet 17 g, 17 g, Per Tube, Daily, Lafayette Dragon, MD, 17 g at 11/11/20 1042 .  polyvinyl alcohol (LIQUIFILM TEARS) 1.4 % ophthalmic solution 1 drop, 1 drop, Both Eyes, TID, Lafayette Dragon, MD, 1 drop at 11/11/20 2200 .  propofol (DIPRIVAN) 1000 MG/100ML infusion, 0-50 mcg/kg/min, Intravenous, Continuous, Lafayette Dragon, MD,  Paused at 11/11/20 [phone] .  sodium bicarbonate first hour bolus via infusion, , Intravenous, Pre-Cath **AND** sodium bicarbonate 150 mEq in dextrose 5 % 1,150 mL infusion, , Intravenous, Pre-Cath, Cammie Sickle A, MD .  sodium bicarbonate 150 mEq in dextrose 5 % 1,150 mL infusion, , Intravenous, Continuous, Lafayette Dragon, MD, Last Rate: 150 mL/hr at 11/12/20 0600, Infusion Verify at 11/12/20 0600 .  thiamine tablet 100 mg, 100 mg, Per Tube, Daily, Julian Hy, DO Labs CBC    Component Value Date/Time   WBC 9.7 11/11/2020 0131   RBC 5.38 11/11/2020 0131   HGB 16.6 11/11/2020 0131   HCT 48.3 11/11/2020 0131   PLT 224 11/11/2020 0131   MCV 89.8 11/11/2020 0131   MCH 30.9 11/11/2020 0131   MCHC 34.4 11/11/2020 0131   RDW 12.7 11/11/2020 0131   LYMPHSABS 1.2 11/10/2020 1551   MONOABS 1.9 (H) 11/10/2020 1551   EOSABS 0.0 11/10/2020 1551   BASOSABS 0.0 11/10/2020 1551    CMP     Component Value Date/Time   NA 136 11/12/2020 0132   K 3.8 11/12/2020 0132   CL 98 11/12/2020 0132   CO2 29 11/12/2020 0132   GLUCOSE 127 (H) 11/12/2020 0132   BUN 31 (H) 11/12/2020 0132   CREATININE 3.33 (H) 11/12/2020 0132   CALCIUM 8.1 (L) 11/12/2020 0132   PROT 6.7 11/11/2020 0131   ALBUMIN 3.5 11/11/2020 0131   AST 672 (H) 11/11/2020 0131   ALT 280 (H) 11/11/2020 0131   ALKPHOS 75 11/11/2020 0131   BILITOT 1.1 11/11/2020 0131   GFRNONAA 25 (L) 11/12/2020 0132  UDS +opiates, +amphetamines, +THC CK 47k Trops uptrending   Imaging I have reviewed images in epic and the results pertinent to this consultation are: CT-scan of the brain IMPRESSION: Normal appearing brain parenchyma. No mass, hemorrhage, or extra-axial fluid collection. Small right frontal scalp hematoma with underlying bony calvarium intact. Paranasal sinus disease at several sites noted.  Agree with rads report on personal review.  Assessment:  30 year old man, past history of IV drug use presenting for  unresponsiveness in the setting of amphetamine use.  Neurological examination was notable for obtundation despite being off of sedation and has intact brainstem reflexes which she continues to have. CT head with no acute abnormality Noted to have generalized tonic-clonic shaking in the ED for which she was hooked up to continuous EEG after loading with Keppra. No seizures on the EEG Exam remains poor Differentials remain-toxic metabolic encephalopathy, AKI rhabdomyolysis, provoked seizure in the setting of drug abuse, rule out septic emboli given IVDU, CNS infection  Continuous EEG negative for seizures  Recommendations: Discontinue continuous EEG MRI brain-with contrast would be favored but due to deranged renal function-MRI brain without contrast should be performed. After that, spinal tap with cell count, glucose, protein, HSV PCR, gram stain. Empiric meningitic coverage and encephalitic coverage.  Discussed my plan with Dr. Tacy Learn in the unit.  Will follow.  -- Amie Portland, MD Neurologist [facility] Pager: [phone]  CRITICAL CARE ATTESTATION Performed by: Amie Portland, MD Total critical care time: 33 minutes Critical care time was exclusive of separately billable procedures and treating other patients and/or supervising APPs/Residents/Students Critical care was necessary to treat or prevent imminent or life-threatening deterioration due to toxic metabolic encephalopathy, This patient is critically ill and at significant risk for neurological worsening and/or death and care requires constant monitoring. Critical care was time spent personally by me on the following activities: development of treatment plan with patient and/or surrogate as well as nursing, discussions with consultants, evaluation of patient's response to treatment, examination of patient, obtaining history from patient or surrogate, ordering and performing treatments and interventions, ordering and  review of laboratory studies, ordering and review of radiographic studies, pulse oximetry, re-evaluation of patient's condition, participation in multidisciplinary rounds and medical decision making of high complexity in the care of this patient.

## 2020-11-12 NOTE — Procedures (Addendum)
Patient Name: Nathaniel Lynn  MRN: [id number]  Epilepsy Attending: Charlsie Quest  Referring Physician/Provider: Dr Jackson Latino Duration: 11/11/2020 1344 to 11/12/2020 1039  Patient history: 30 y.o.malewith PMH significant for IVDU who presents withunresponsiveness in the setting of amphetamine use. EEG to evaluate for seizure  Level of alertness: comatose  AEDs during EEG study: None  Technical aspects: This EEG study was done with scalp electrodes positioned according to the 10-20 International system of electrode placement. Electrical activity was acquired at a sampling rate of 500Hz  and reviewed with a high frequency filter of 70Hz  and a low frequency filter of 1Hz . EEG data were recorded continuously and digitally stored.   Description: EEG showed continuous generalized polymorphic 3 to 6 Hz theta-delta slowing. Hyperventilation and photic stimulation were not performed.     ABNORMALITY - Continuous slow, generalized  IMPRESSION: This study is suggestive of moderate to severe diffuse encephalopathy, nonspecific etiology. No seizures or epileptiform discharges were seen throughout the recording.   [name] 

## 2020-11-12 NOTE — Progress Notes (Signed)
EEG leads maint done.

## 2020-11-12 NOTE — Progress Notes (Signed)
EEG unhooked, some skin abnormalities seen on chest, neck, forehead, scalp.

## 2020-11-12 NOTE — Progress Notes (Signed)
Initial Nutrition Assessment  DOCUMENTATION CODES:   Not applicable  INTERVENTION:   Initiate tube feeding via OG tube: Vital 1.5 at 65 ml/h (1560 ml per day) Prosource TF 45 ml BID  Provides 2420 kcal, 127 gm protein, 1185 ml free water daily  D/C liquid multivitamin as it is incomplete MVI with minerals per tube daily   NUTRITION DIAGNOSIS:   Inadequate oral intake related to inability to eat as evidenced by NPO status.  GOAL:   Patient will meet greater than or equal to 90% of their needs  MONITOR:   TF tolerance  REASON FOR ASSESSMENT:   Consult Enteral/tube feeding initiation and management  ASSESSMENT:   Pt with PMH of polysubstance abuse recently released from prison admitted after being found down admitted with acute metabolic encephalopathy and AKI due to rhabdomyolysis.   Pt discussed during ICU rounds and with RN.  Per MD acute encephalopathy due to drug overdose vs herpes vs meningitis.   Patient is currently intubated on ventilator support MV: 8 L/min Temp (24hrs), Avg:100 F (37.8 C), Min:98.78 F (37.1 C), Max:101.12 F (38.4 C)  Medications reviewed and include: colace, folic acid, SSI, MVI, miralax, thiamine  Fentanyl  Labs reviewed: BUN 31, Cr 3.33, K+/PO4/Mg WNL CK: 15,401 OG tube; tip below diaphragm per xray   Current TF: Nepro @ 20 ml/hr   NUTRITION - FOCUSED PHYSICAL EXAM:  Flowsheet Row Most Recent Value  Orbital Region No depletion  Upper Arm Region Unable to assess  Thoracic and Lumbar Region No depletion  Buccal Region No depletion  Temple Region No depletion  Clavicle Bone Region No depletion  Clavicle and Acromion Bone Region No depletion  Scapular Bone Region Unable to assess  Dorsal Hand Unable to assess  Patellar Region No depletion  Anterior Thigh Region No depletion  Posterior Calf Region No depletion  Edema (RD Assessment) Mild  Hair Reviewed  Eyes Unable to assess  Mouth Unable to assess  Skin Reviewed  [HSV  blisters on multiple areas of the skin]  Nails Unable to assess       Diet Order:   Diet Order            Diet NPO time specified  Diet effective now                 EDUCATION NEEDS:   No education needs have been identified at this time  Skin:  Skin Assessment: Reviewed RN Assessment (HSV blisters on multiple area of the body)  Last BM:  unknown  Height:   Ht Readings from Last 1 Encounters:  11/10/20 5\' 10"  (1.778 m)    Weight:   Wt Readings from Last 1 Encounters:  11/10/20 78 kg    Ideal Body Weight:     BMI:  There is no height or weight on file to calculate BMI.  Estimated Nutritional Needs:   Kcal:  2300  Protein:  120-135 grams  Fluid:  >2.3 L/day  11/12/20., RD, LDN, CNSC See AMiON for contact information

## 2020-11-12 NOTE — Progress Notes (Signed)
eLink Physician-Brief Progress Note Patient Name: ROBT OKUDA DOB: [birth year] MRN: [id number]   Date of Service  11/12/2020  HPI/Events of Note  Patient with sub-optimal BP control.  eICU Interventions  Hydralazine dosing, and dosing interval adjusted to optimize BP control.        Thomasene Lot Marice Angelino 11/12/2020, 9:57 PM

## 2020-11-12 NOTE — Progress Notes (Addendum)
NAME:  Nathaniel Lynn, MRN:  [id number], DOB:  [birth year], LOS: 2 ADMISSION DATE:  11/10/2020, CONSULTATION DATE:  11/11/20 REFERRING MD:  Valeria Batman, CHIEF COMPLAINT:   hypoxia  History of Present Illness:  [name] is a 30 year old young man with PMH significant for narcotic drug abuse.  The patient's mother reported that the patient had been released from prison 2 weeks ago after serving a 2 year sentence.  Since his release, he has been involved in drug abuse with his girlfriend.    The patient's mother reported that her daughter called her around 63 AM reporting that she was unable to reach her brother as he was not answering his phone.  She told her mother that the patient's probation officer called her to report that the patient's ankle monitoring device had sounded an alarm and the probation officer was unable to reach her brother. The daughter tried several times and when she could not reach her brother she called her mother at 26 AM who was already at work.   The sister was finally able to get another family member to go to the patient's girlfriend's house in search of her brother.  When she arrived around 3 pm, she found her brother unresponsive.   The girlfriend confirmed that they were getting "high". The girlfriend reported that she had given the patient narcan but she was still unable to arouse him.  Hence at that time 911 was called and the patient was taken to [facility] for evaluation. It is reported that the initial blood sugar was 140 in the field. [facility] nurse also documented that patient initially localized to painful stimuli.  The patient's toxicology screen came back positive for Amphetamines, opiates and cannabinoid. He was found to have acute kidney injury and CK level was > 50 thousand.  CT head was negative for acute pathology and lactic acid was elevated.  While in the ED, the patient was given Ativan x 2 for tonic-clonic seizure like activity.  The patient was  intubated shortly after seizure like activity was noted.  He was also found to be " posturing" therefore the neurologist recommended transfer to [facility] for continuous EEG monitoring.  I have seen and examine the patient.  He is unable to contribute to this consultation.  Hence this consultation was done with the assistance of the patient's mother and review of [facility] records.  Pertinent  Medical History  Drug abuse  Significant Hospital Events: Including procedures, antibiotic start and stop dates in addition to other pertinent events   . 5/14 admitted, intubated, transferred to [facility]. Started on empiric abx- vanc, cefepime, flagyl . 5/16 change cefepime to ceftriaxone, add on ampicillin  Interim History / Subjective:  Remains intubated, sedated on propofol.  Objective   Blood pressure (!) 153/99, pulse 85, temperature 99.14 F (37.3 C), resp. rate 16, SpO2 100 %.    Vent Mode: PRVC FiO2 (%):  [30 %] 30 % Set Rate:  [16 bmp] 16 bmp Vt Set:  [500 mL] 500 mL PEEP:  [5 cmH20] 5 cmH20 Plateau Pressure:  [11 cmH20-14 cmH20] 11 cmH20   Intake/Output Summary (Last 24 hours) at 11/12/2020 0835 Last data filed at 11/12/2020 0600 Gross per 24 hour  Intake 3507.65 ml  Output 550 ml  Net 2957.65 ml   There were no vitals filed for this visit.  Examination: General: critically ill appearing man lying in bed in NAD, intubated, sedated HENT: Abrasion to left forehead, mild conjunctival injection,  Lungs: bilateral vesicular breath sound, no tracheal secretions Cardiovascular: S1S2, RRR Abdomen: soft, nondistended and non tender Extremities: healed skin popping scars on left thigh, ankle tracking device R ankle with mild area of erythema and edema just distal to it on his shin. Tattoos.  Neuro: RASS -5, pinpoint pupils GU: foley draining yellow colored urine Skin: coalescing pustular lesion under the right corner of mouth and left jaw, on umbilicus  Labs/imaging that I havepersonally reviewed   (right click and "Reselect all SmartList Selections" daily)   BUN/Cr 31/3.33 LA 3>2>1.5 CK >50k>> 47K Trop 2806> 6533 UDS: amphetamines, THC, opiates Blood culture (1 site) pending HIV nonreactive Ultrasound shows bilateral hydronephrosis Resolved Hospital Problem list   Lactic acidosis  Assessment & Plan:  Acute metabolic encephalopathy due to drug overdose - EEG was insignificant for seizure and no slow wave detected - for MRI today   Acute respiratory failure with hypoxia -LTVV, 4-8cc/kg IBW with goal Pplat<30 and DP<15 -VAP prevention protocol -PAD protocol for sedation; goal 0 to -1 -daily SAT & SBT  Meningitis/ encephalitis? -change from cefepime to ceftriaxone  -start acyclovir -continue vancomycin -follow culture until finalized  AKI due to rhabdo -1L normal saline bolus -stop bicarb as hypocalcemia is present -strict I/Os -renally dose meds, avoid nephrotoxic meds  Bilateral hydronephrosis - may cause AKI  - urine sodium and urine creatinine for FeNa  Trop elevation, likely due to rhabdo -echo pending -tele monitoring -con't aspirin; no statin due to rhabdo  IVDU -HIV was nonreactive -echocardiogram for possible endocarditis -will counsel to avoid this when stable  Elevated transaminases - likely secondary to [employer] practice (right click and "Reselect all SmartList Selections" daily)  Diet:  Tube Feed  Pain/Anxiety/Delirium protocol (if indicated): Yes (RASS goal -1) VAP protocol (if indicated): Yes DVT prophylaxis: LMWH GI prophylaxis: PPI Glucose control:  SSI Yes Central venous access:  N/A Arterial line:  N/A Foley:  Yes, and it is still needed Mobility:  bed rest  PT consulted: N/A Last date of multidisciplinary goals of care discussion [ ]  Code Status:  full code Disposition: ICU   Labs   CBC: Recent Labs  Lab 11/10/20 1551 11/11/20 0131  WBC 15.3* 9.7  NEUTROABS 12.1*  --   HGB 16.6 16.6  HCT 48.1 48.3  MCV 89.6 89.8   PLT 312 224    Basic Metabolic Panel: Recent Labs  Lab 11/10/20 1551 11/11/20 0131 11/12/20 0132  NA 137 136 136  K 4.8 4.2 3.8  CL 97* 103 98  CO2 23 22 29   GLUCOSE 126* 104* 127*  BUN 24* 25* 31*  CREATININE 2.33* 2.38* 3.33*  CALCIUM 9.0 8.3* 8.1*  MG  --  2.2 2.4  PHOS  --  4.8* 4.2   GFR: Estimated Creatinine Clearance: 34.1 mL/min (A) (by C-G formula based on SCr of 3.33 mg/dL (H)). Recent Labs  Lab 11/10/20 1551 11/10/20 1800 11/10/20 1952 11/11/20 0131 11/11/20 0436  WBC 15.3*  --   --  9.7  --   LATICACIDVEN  --  3.0* 3.0* 2.0* 1.5    Liver Function Tests: Recent Labs  Lab 11/10/20 1551 11/11/20 0131  AST 669* 672*  ALT 265* 280*  ALKPHOS 93 75  BILITOT 1.1 1.1  PROT 8.8* 6.7  ALBUMIN 4.8 3.5   Recent Labs  Lab 11/11/20 0131  LIPASE 25   No results for input(s): AMMONIA in the last 168 hours.  ABG    Component Value Date/Time   PHART 7.40 11/10/2020  1720   PCO2ART 34 11/10/2020 1720   PO2ART 178 (H) 11/10/2020 1720   HCO3 21.1 11/10/2020 1720   O2SAT 99.6 11/10/2020 1720     Coagulation Profile: No results for input(s): INR, PROTIME in the last 168 hours.  Cardiac Enzymes: Recent Labs  Lab 11/10/20 1630 11/11/20 0131  CKTOTAL >50,000* 47,139*    HbA1C: Hgb A1c MFr Bld  Date/Time Value Ref Range Status  11/11/2020 01:31 AM 5.1 4.8 - 5.6 % Final    Comment:    (NOTE) Pre diabetes:          5.7%-6.4%  Diabetes:              >6.4%  Glycemic control for   <7.0% adults with diabetes     CBG: Recent Labs  Lab 11/11/20 1536 11/11/20 1933 11/11/20 2330 11/12/20 0410 11/12/20 0745  GLUCAP 135* 155* 121* 131* 157*

## 2020-11-12 NOTE — Procedures (Signed)
LUMBAR PUNCTURE (SPINAL TAP) PROCEDURE NOTE  Indication: encephalopathy    Proceduralists: S. Toberman, NP    Risks of the procedure were dicussed with the patient including post-LP headache, bleeding, infection, weakness/numbness of legs(radiculopathy), death.    Consent obtained from: emergent for procedure given concern for infection    Procedure Note The patient was prepped and draped, and using sterile technique a 20 gauge quinke spinal needle was inserted in the L4-5 space.   No CSF return despite multiple attempts.  Patient tolerated the procedure well and blood loss was minimal.   -- Milon Dikes, MD Neurologist [facility] Pager: [phone]

## 2020-11-12 NOTE — Progress Notes (Signed)
Called patients mother, Nathaniel Lynn, at [phone] for update on Son's status. No answer. Left voicemail. If Okey Regal calls back I will attempt to call again if I am available.   Gershon Mussel., MSN, APRN, AGACNP-BC [facility]  11/12/2020 , 1:18 PM  Please see [email or web address] for pager details  If no response, please call [phone] After hours, please call Elink at [phone]

## 2020-11-12 NOTE — Progress Notes (Signed)
Peripherally Inserted Central Catheter Placement  The IV Nurse has discussed with the patient and/or persons authorized to consent for the patient, the purpose of this procedure and the potential benefits and risks involved with this procedure.  The benefits include less needle sticks, lab draws from the catheter, and the patient may be discharged home with the catheter. Risks include, but not limited to, infection, bleeding, blood clot (thrombus formation), and puncture of an artery; nerve damage and irregular heartbeat and possibility to perform a PICC exchange if needed/ordered by physician.  Alternatives to this procedure were also discussed.  Bard Power PICC patient education guide, fact sheet on infection prevention and patient information card has been provided to patient /or left at bedside.    PICC Placement Documentation  PICC Triple Lumen 11/12/20 PICC Left Brachial 41 cm 0 cm (Active)  Indication for Insertion or Continuance of Line Prolonged intravenous therapies 11/12/20 1619  Exposed Catheter (cm) 0 cm 11/12/20 1619  Site Assessment Clean;Dry;Intact 11/12/20 1619  Lumen #1 Status Flushed;Blood return noted 11/12/20 1619  Lumen #2 Status Flushed;Blood return noted 11/12/20 1619  Lumen #3 Status Flushed;Blood return noted 11/12/20 1619  Dressing Type Transparent 11/12/20 1619  Dressing Status Clean;Dry;Intact 11/12/20 1619  Antimicrobial disc in place? Yes 11/12/20 1619  Dressing Intervention New dressing;Other (Comment) 11/12/20 1619  Dressing Change Due 11/19/20 11/12/20 1619   Telephone consent signed by mother    Maximino Greenland 11/12/2020, 4:21 PM

## 2020-11-12 NOTE — Progress Notes (Signed)
RT NOTES: Transported patient to MRI and back to room 4N21 on vent without incident.

## 2020-11-13 ENCOUNTER — Encounter (HOSPITAL_COMMUNITY): Payer: Self-pay

## 2020-11-13 DIAGNOSIS — G9340 Encephalopathy, unspecified: Secondary | ICD-10-CM

## 2020-11-13 DIAGNOSIS — F1910 Other psychoactive substance abuse, uncomplicated: Secondary | ICD-10-CM

## 2020-11-13 LAB — MAGNESIUM: Magnesium: 2.2 mg/dL (ref 1.7–2.4)

## 2020-11-13 LAB — BASIC METABOLIC PANEL
Anion gap: 9 (ref 5–15)
BUN: 39 mg/dL — ABNORMAL HIGH (ref 6–20)
CO2: 29 mmol/L (ref 22–32)
Calcium: 7.5 mg/dL — ABNORMAL LOW (ref 8.9–10.3)
Chloride: 101 mmol/L (ref 98–111)
Creatinine, Ser: 3.65 mg/dL — ABNORMAL HIGH (ref 0.61–1.24)
GFR, Estimated: 22 mL/min — ABNORMAL LOW (ref >60)
Glucose, Bld: 137 mg/dL — ABNORMAL HIGH (ref 70–99)
Potassium: 3.6 mmol/L (ref 3.5–5.1)
Sodium: 139 mmol/L (ref 135–145)

## 2020-11-13 LAB — COMPREHENSIVE METABOLIC PANEL
ALT: 146 U/L — ABNORMAL HIGH (ref 0–44)
AST: 252 U/L — ABNORMAL HIGH (ref 15–41)
Albumin: 2.1 g/dL — ABNORMAL LOW (ref 3.5–5.0)
Alkaline Phosphatase: 47 U/L (ref 38–126)
Anion gap: 5 (ref 5–15)
BUN: 36 mg/dL — ABNORMAL HIGH (ref 6–20)
CO2: 30 mmol/L (ref 22–32)
Calcium: 7.2 mg/dL — ABNORMAL LOW (ref 8.9–10.3)
Chloride: 103 mmol/L (ref 98–111)
Creatinine, Ser: 3.52 mg/dL — ABNORMAL HIGH (ref 0.61–1.24)
GFR, Estimated: 23 mL/min — ABNORMAL LOW (ref >60)
Glucose, Bld: 148 mg/dL — ABNORMAL HIGH (ref 70–99)
Potassium: 3 mmol/L — ABNORMAL LOW (ref 3.5–5.1)
Sodium: 138 mmol/L (ref 135–145)
Total Bilirubin: 0.5 mg/dL (ref 0.3–1.2)
Total Protein: 4.7 g/dL — ABNORMAL LOW (ref 6.5–8.1)

## 2020-11-13 LAB — GLUCOSE, CAPILLARY
Glucose-Capillary: 119 mg/dL — ABNORMAL HIGH (ref 70–99)
Glucose-Capillary: 126 mg/dL — ABNORMAL HIGH (ref 70–99)
Glucose-Capillary: 134 mg/dL — ABNORMAL HIGH (ref 70–99)
Glucose-Capillary: 151 mg/dL — ABNORMAL HIGH (ref 70–99)
Glucose-Capillary: 156 mg/dL — ABNORMAL HIGH (ref 70–99)
Glucose-Capillary: 167 mg/dL — ABNORMAL HIGH (ref 70–99)

## 2020-11-13 LAB — BILIRUBIN, DIRECT: Bilirubin, Direct: 0.1 mg/dL (ref 0.0–0.2)

## 2020-11-13 LAB — CK: Total CK: 11430 U/L — ABNORMAL HIGH (ref 49–397)

## 2020-11-13 LAB — PHOSPHORUS: Phosphorus: 3.9 mg/dL (ref 2.5–4.6)

## 2020-11-13 MED ORDER — HALOPERIDOL LACTATE 5 MG/ML IJ SOLN
5.0000 mg | Freq: Once | INTRAMUSCULAR | Status: AC | PRN
  Administered 2020-11-14: 5 mg via INTRAVENOUS
  Filled 2020-11-13: qty 1

## 2020-11-13 MED ORDER — DEXMEDETOMIDINE HCL IN NACL 400 MCG/100ML IV SOLN
0.2000 ug/kg/h | INTRAVENOUS | Status: DC
  Administered 2020-11-14: 0.4 ug/kg/h via INTRAVENOUS
  Administered 2020-11-14: 0.8 ug/kg/h via INTRAVENOUS
  Filled 2020-11-13 (×3): qty 100

## 2020-11-13 MED ORDER — POTASSIUM CHLORIDE 20 MEQ PO PACK
20.0000 meq | PACK | Freq: Once | ORAL | Status: AC
  Administered 2020-11-13: 20 meq

## 2020-11-13 MED ORDER — POTASSIUM CHLORIDE 20 MEQ PO PACK
40.0000 meq | PACK | Freq: Once | ORAL | Status: DC
  Filled 2020-11-13: qty 2

## 2020-11-13 MED ORDER — POTASSIUM CHLORIDE 20 MEQ PO PACK
20.0000 meq | PACK | Freq: Once | ORAL | Status: DC
  Filled 2020-11-13: qty 1

## 2020-11-13 MED ORDER — LACTATED RINGERS IV SOLN: INTRAVENOUS | Status: DC

## 2020-11-13 MED ORDER — CLONIDINE HCL 0.1 MG PO TABS
0.1000 mg | ORAL_TABLET | Freq: Two times a day (BID) | ORAL | Status: DC
  Administered 2020-11-13 (×2): 0.1 mg
  Filled 2020-11-13 (×4): qty 1

## 2020-11-13 MED ORDER — POTASSIUM CHLORIDE 20 MEQ PO PACK
40.0000 meq | PACK | Freq: Once | ORAL | Status: AC
  Administered 2020-11-13: 40 meq

## 2020-11-13 NOTE — Progress Notes (Signed)
Updated Robet Crutchfield (Sister) via phone at [phone] on her brothers condition.  Gershon Mussel., MSN, APRN, AGACNP-BC [facility]  11/13/2020 , 3:28 PM  Please see [email or web address] for pager details  If no response, please call [phone] After hours, please call Elink at [phone]

## 2020-11-13 NOTE — Progress Notes (Signed)
RT NOTES: Found patient on pressure support, respiratory rate of 9. Placed back on full support.

## 2020-11-13 NOTE — Progress Notes (Signed)
NAME:  Nathaniel Lynn, MRN:  [id number], DOB:  05-06-1991, LOS: 3 ADMISSION DATE:  11/10/2020, CONSULTATION DATE:  11/11/20 REFERRING MD:  Valeria Batman, CHIEF COMPLAINT:   hypoxia  History of Present Illness:  Nathaniel Lynn. Choinski is a 30 year old young man with PMH significant for narcotic drug abuse and recent prison release, who was transferred from [facility] on 5/14 for unresponsiveness and seizure in the setting of drug overdose (+ amphetamines) and was found to be in rhabdo. PCCM was consulted on 5/15 for admission  He remains in the 4N ICU in critical condition.  Pertinent  Medical History  Drug abuse  Significant Hospital Events: Including procedures, antibiotic start and stop dates in addition to other pertinent events   . 5/14 admitted, intubated, transferred to [facility]. Started on empiric abx- vanc, cefepime, flagyl. ETT. Marland Kitchen 5/16 ABX switched to Vanc, ceftriaxone. Acyclovir started. MRI ? Hypoxic ischemic encephalopathy, uremic encephalopathy, and toxic/metabolic leukoencephalopathies. PICC>> ECHO- LVEF 50 to 55%, RVSF low normal. Trivial MR   Interim History / Subjective:  MRI obtained in afternoon, X2 attempts at LP without success, PICC inserted  Hypertensive overnight and agitated. PRN hydralazine started, Cardine GTT initiated, Precedex/fentanyl started.   uop, 6.46ml intake, +9L admit  Intubated/sedated. Dex .2, fentanyl .25  Unable to obtain subjective evaluation due to patient status  Objective   Blood pressure 130/68, pulse (!) 108, temperature 98.42 F (36.9 C), resp. rate 16, weight 94.7 kg, SpO2 100 %.    Vent Mode: PRVC FiO2 (%):  [30 %] 30 % Set Rate:  [16 bmp] 16 bmp Vt Set:  [500 mL] 500 mL PEEP:  [5 cmH20] 5 cmH20 Plateau Pressure:  [12 cmH20] 12 cmH20   Intake/Output Summary (Last 24 hours) at 11/13/2020 0727 Last data filed at 11/13/2020 0600 Gross per 24 hour  Intake 6678.07 ml  Output 1080 ml  Net 5598.07 ml   Filed Weights   11/13/20 0500   Weight: 94.7 kg    Examination: General: Ill appearing, in bed, no acute distress HEENT: MM pink/moist, anicteric, trachea midline, ETT/OGT  Neuro: GCS 6T, withdraws RUE, no eye opening, contraction of all extremity, RASS -4, PERRL 29mm, discongugate gaze, +dolls eye, +Corneal, cough and gag. CV: S1S2, ST, no m/r/g appreciated PULM:  Clear in the upper lobes, clear in the lower lobes, scant secretions, chest expansion symmetric GI: soft, bsx4 active, non distended Extremities: warm/dry, right arm more swollen compared to left,  capillary refill less than 3 seconds  Skin: small clusters of pustules on right temporal area, right lip, left ear, right pectoral, right nipple, umbilicus   Labs/imaging that I havepersonally reviewed  (right click and "Reselect all SmartList Selections" daily)  ECHO, MRI, Renal US, CK  Resolved Hospital Problem list     Assessment & Plan:  Acute metabolic encephalopathy VS Hypoxic ischemic encephalopathy, due to drug overdose and downtime Seizure- likely precipitated by stimulant use  MRI Brain 5/16- Hypoxic ischemic encephalopathy, uremic encephalopathy, and toxic/metabolic leukoencephalopathies. -Appreciate Neurology assistance. -EEG stopped 5/16, negative for seizure -Dr. Wilford Corner attempted LP yesterday without success. Dr. Merrily Pew attempted LP yesterday without success. Will consult IR for LP today.  -Continue Vanc/Ceftriaxone/Acyclovir for empiric meningitic and HSV encephalitic coverage. -Continue Neurochecks -Agitated overnight. Rass +3 documented. RASS goal 0. Continue precedex. Wean to goal. Stopped fentanyl.  -Started clonidine 0.1mg  bid for withdrawal ppx  Acute respiratory failure with hypoxia -LTVV strategy with tidal volumes of 4-8 cc/kg ideal body weight -Goal plateau pressures of 30 and  driving pressures of 15 -Wean PEEP/FiO2 for SpO2 greater than 92 -Follow intermittent CXR and ABG PRN -Daily SAT/SBT. Tolerating SBT. -VAP bundle -PAD  bundle as discussed above  AKI due to rhabdo Rhabdomyolysis  Creat 2.38>3.33>3.52, suspect plateauing, CK 47k>15K>11, Renal US negative for Hydro or obstruction. 1L UOP. No anion gap. Suspect ATN from rhabdo. -Ensure renal perfusion. Goal MAP 65 or greater. -Avoid neprotoxic drugs as possible. Renally adjust medications -Strict I&O's -Follow up AM creatinine -Continue NS GTT at 150 for hydration, will wean as TF adjusted to goal. -Continue Foley  Electrolyte Abnormality- Hypokalemia -Cautious replacement of K in the setting of AKI  Hypertension Hypertensive overnight 5/16-5/17. Suspect secondary to withdraw. -Continue PRN hydralazine for SBP greater than 160 -Continue nicardipine gtt if hydralazine unsuccessful.  -Started clonidine 0.1mg  bid for withdrawal ppx  Trop elevation, likely due to rhabdo ECHO- LVEF 50 to 55%, RVSF low normal. Trivial MR. No LV wall motion abnormalities. -Continue Tele -Continue ASA, no statin d/t rhabdo  ? Sepsis Febrile, meningitic vs HSV encephalitis -Continue ABX as discussed -Follow up BC, pending this AM -Follow up CSF cultures if IR completes for LP  -Follow up WBC count, trend fever/wbc curve  IVDU HIV negative -counseling when appropriate  Elevated transaminases, likely secondary to rhabdo LFTs improving  Pustule Rashes -Continue ABX as discussed -Monitor rashes  Best practice (right click and "Reselect all SmartList Selections" daily)  Diet:  Tube Feed  Pain/Anxiety/Delirium protocol (if indicated): Yes (RASS goal 0) VAP protocol (if indicated): Yes DVT prophylaxis: LMWH GI prophylaxis: PPI Glucose control:  SSI Yes Central venous access:  Yes, and it is still needed Arterial line:  N/A Foley:  Yes, and it is still needed Mobility:  bed rest  PT consulted: N/A Last date of multidisciplinary goals of care discussion [See documentation 5/16, will call sister this afternoon] Code Status:  full code Disposition: ICU   This  patient is critically ill with multiple organ system failure which requires frequent high complexity decision making, assessment, support, evaluation, and titration of therapies. This was completed through the application of advanced monitoring technologies and extensive interpretation of multiple databases. During this encounter critical care time was devoted to patient care services described in this note for 32 minutes.  Gershon Mussel., MSN, APRN, AGACNP-BC [facility]  11/13/2020 , 7:27 AM  Please see [email or web address] for pager details  If no response, please call [phone] After hours, please call Elink at [phone]

## 2020-11-13 NOTE — Progress Notes (Signed)
Foley catheter removed per order at 1100. No void via external catheter. Bladder scan performed at 1800, 212 mL urine visualized. I&O cath performed per MD order with 225 mL urine returned. IV fluids remain at 150 mL/hr. MD made aware of low output.

## 2020-11-13 NOTE — Progress Notes (Signed)
Lovenox dose discontinued in anticipation of LP under fluero on 5/18. Will need to resume DVT ppx post procedure or if patient does not go for procedure.   Gershon Mussel., MSN, APRN, AGACNP-BC [facility]  11/13/2020 , 7:30 PM  Please see [email or web address] for pager details  If no response, please call [phone] After hours, please call Elink at [phone]

## 2020-11-13 NOTE — Progress Notes (Signed)
Neurology Progress Note   S://  Seen and examined. Overnight issues with blood pressure started on Cardene. Also started on fentanyl and Precedex. No purposeful movements or responses on examination overnight per day team RN. LP attempted- by neurology and PCCM-unsuccessful at bedside.   O:// Current vital signs: BP 130/68   Pulse (!) 108   Temp 98.42 F (36.9 C)   Resp 16   Wt 94.7 kg Comment: Bed scale  SpO2 100%   BMI 29.96 kg/m  Vital signs in last 24 hours: Temp:  [98.42 F (36.9 C)-100.58 F (38.1 C)] 98.42 F (36.9 C) (05/17 0719) Pulse Rate:  [72-157] 108 (05/17 0719) Resp:  [13-20] 16 (05/17 0719) BP: (124-242)/(58-111) 130/68 (05/17 0719) SpO2:  [97 %-100 %] 100 % (05/17 0719) FiO2 (%):  [30 %] 30 % (05/17 0719) Weight:  [94.7 kg] 94.7 kg (05/17 0500) General: Intubated, sedated on minimal dose of Precedex at the time. HEENT: Normocephalic, atraumatic, multiple rashes on the face. Lungs: Vented Cardiovascular: Regular rate rhythm Abdomen soft nondistended nontender Extremities: No edema, multiple rashes on arms and legs Neurologic exam Mental status: Intubated, on minimal sedation with Precedex. Precedex was held for exam. No spontaneous movement No response to voice. Cranials: Pupils 2 mm sluggish bilaterally, mildly disconjugate gaze, positive corneals, breathing over the ventilator, difficult to ascertain facial symmetry. Motor exam: Minimal movement in bilateral lower extremities to extreme noxious stimulation to the inner thighs-question triple flexion versus true withdrawal. Sensory exam: As above Coordination cannot be assessed  Medications  Current Facility-Administered Medications:  .  0.9 %  sodium chloride infusion, , Intravenous, Continuous, Estill Cotta, NP, Last Rate: 150 mL/hr at 11/13/20 0700, Infusion Verify at 11/13/20 0700 .  0.9 %  sodium chloride infusion, , Intravenous, PRN, Jacky Kindle, MD, Stopped at 11/13/20 0100 .   acyclovir (ZOVIRAX) 780 mg in dextrose 5 % 150 mL IVPB, 10 mg/kg, Intravenous, Q12H, von Caren Griffins, RPH, Stopped at 11/12/20 2347 .  aspirin tablet 325 mg, 325 mg, Per NG tube, Daily, Lafayette Dragon, MD, 325 mg at 11/12/20 0949 .  cefTRIAXone (ROCEPHIN) 2 g in sodium chloride 0.9 % 100 mL IVPB, 2 g, Intravenous, Q12H, Estill Cotta, NP, Stopped at 11/12/20 2236 .  chlorhexidine gluconate (MEDLINE KIT) (PERIDEX) 0.12 % solution 15 mL, 15 mL, Mouth Rinse, BID, Stretch, Marily Lente, MD, 15 mL at 11/12/20 2024 .  Chlorhexidine Gluconate Cloth 2 % PADS 6 each, 6 each, Topical, Daily, Julian Hy, DO, 6 each at 11/13/20 0500 .  dexmedetomidine (PRECEDEX) 400 MCG/100ML (4 mcg/mL) infusion, 0.2-0.8 mcg/kg/hr, Intravenous, Titrated, Ogan, Okoronkwo U, MD, Last Rate: 3.9 mL/hr at 11/13/20 0700, 0.2 mcg/kg/hr at 11/13/20 0700 .  docusate (COLACE) 50 MG/5ML liquid 100 mg, 100 mg, Per Tube, BID, Lafayette Dragon, MD, 100 mg at 11/12/20 2206 .  docusate (COLACE) 50 MG/5ML liquid 100 mg, 100 mg, Per Tube, BID PRN, Tacy Learn, Sudham, MD .  enoxaparin (LOVENOX) injection 40 mg, 40 mg, Subcutaneous, Q24H, Chand, Sudham, MD .  feeding supplement (PROSource TF) liquid 45 mL, 45 mL, Per Tube, BID, Jacky Kindle, MD, 45 mL at 11/12/20 2206 .  feeding supplement (VITAL 1.5 CAL) liquid 1,000 mL, 1,000 mL, Per Tube, Continuous, Chand, Sudham, MD, Last Rate: 65 mL/hr at 11/12/20 1751, 1,000 mL at 11/12/20 1751 .  fentaNYL 2563mg in NS 2564m(1014mml) infusion-PREMIX, 25-100 mcg/hr, Intravenous, Continuous, RicLafayette DragonD, Last Rate: 2.5 mL/hr at 11/13/20 0613, 25 mcg/hr at 11/13/20 0613 .  folic acid (FOLVITE) tablet 1 mg, 1 mg, Per Tube, Daily, Noemi Chapel P, DO, 1 mg at 11/12/20 0949 .  hydrALAZINE (APRESOLINE) injection 10-20 mg, 10-20 mg, Intravenous, Q4H PRN, Ogan, Okoronkwo U, MD, 20 mg at 11/12/20 2200 .  insulin aspart (novoLOG) injection 0-6 Units, 0-6 Units, Subcutaneous, Q4H, Lafayette Dragon,  MD, 1 Units at 11/13/20 0416 .  ipratropium-albuterol (DUONEB) 0.5-2.5 (3) MG/3ML nebulizer solution 3 mL, 3 mL, Nebulization, Q6H PRN, Lafayette Dragon, MD .  MEDLINE mouth rinse, 15 mL, Mouth Rinse, 10 times per day, Stretch, Marily Lente, MD, 15 mL at 11/13/20 0607 .  multivitamin with minerals tablet 1 tablet, 1 tablet, Per Tube, Daily, Chand, Sudham, MD .  nicardipine (CARDENE) 72m in 0.86% saline 2075mIV infusion (0.1 mg/ml), 0-15 mg/hr, Intravenous, Continuous, Ogan, OkKerry KassMD, Stopped at 11/13/20 0606 .  pantoprazole sodium (PROTONIX) 40 mg/20 mL oral suspension 40 mg, 40 mg, Per Tube, Daily, Chand, Sudham, MD, 40 mg at 11/12/20 1006 .  polyethylene glycol (MIRALAX / GLYCOLAX) packet 17 g, 17 g, Per Tube, Daily, RiLafayette DragonMD, 17 g at 11/12/20 0949 .  polyethylene glycol (MIRALAX / GLYCOLAX) packet 17 g, 17 g, Per Tube, Daily PRN, ChTacy LearnSudham, MD .  polyvinyl alcohol (LIQUIFILM TEARS) 1.4 % ophthalmic solution 1 drop, 1 drop, Both Eyes, TID, RiLafayette DragonMD, 1 drop at 11/12/20 2241 .  sodium chloride flush (NS) 0.9 % injection 10-40 mL, 10-40 mL, Intracatheter, Q12H, Chand, SuCurrie ParisMD, 40 mL at 11/12/20 2202 .  sodium chloride flush (NS) 0.9 % injection 10-40 mL, 10-40 mL, Intracatheter, PRN, ChJacky KindleMD .  thiamine tablet 100 mg, 100 mg, Per Tube, Daily, ClNoemi Chapel, DO, 100 mg at 11/12/20 0949 .  vancomycin (VANCOREADY) IVPB 1000 mg/200 mL, 1,000 mg, Intravenous, Q24H, von DoCaren GriffinsRPH Labs CBC    Component Value Date/Time   WBC 9.7 11/11/2020 0131   RBC 5.38 11/11/2020 0131   HGB 16.6 11/11/2020 0131   HCT 48.3 11/11/2020 0131   PLT 224 11/11/2020 0131   MCV 89.8 11/11/2020 0131   MCH 30.9 11/11/2020 0131   MCHC 34.4 11/11/2020 0131   RDW 12.7 11/11/2020 0131   LYMPHSABS 1.2 11/10/2020 1551   MONOABS 1.9 (H) 11/10/2020 1551   EOSABS 0.0 11/10/2020 1551   BASOSABS 0.0 11/10/2020 1551    CMP     Component Value Date/Time   NA 136  11/12/2020 0132   K 3.8 11/12/2020 0132   CL 98 11/12/2020 0132   CO2 29 11/12/2020 0132   GLUCOSE 127 (H) 11/12/2020 0132   BUN 31 (H) 11/12/2020 0132   CREATININE 3.33 (H) 11/12/2020 0132   CALCIUM 8.1 (L) 11/12/2020 0132   PROT 6.7 11/11/2020 0131   ALBUMIN 3.5 11/11/2020 0131   AST 672 (H) 11/11/2020 0131   ALT 280 (H) 11/11/2020 0131   ALKPHOS 75 11/11/2020 0131   BILITOT 1.1 11/11/2020 0131   GFRNONAA 25 (L) 11/12/2020 0132  UDS +opiates, +amphetamines, +THC Blood cultures unremarkable thus far  Creatinine 3.33 yesterday-today's labs pending. CK down from 47,000-15,000 yesterday.  Today's labs pending. Lactate normalized  Troponins 1848--> 2806--> 6533.  On 11/11/2020.  Imaging I have reviewed images in epic and the results pertinent to this consultation are: CT-scan of the brain IMPRESSION: Normal appearing brain parenchyma. No mass, hemorrhage, or extra-axial fluid collection. Small right frontal scalp hematoma with underlying bony calvarium intact. Paranasal sinus disease at several sites noted.  Agree  with rads report on personal review.  MRI brain completed yesterday with subtle confluent areas of mildly restricted diffusion predominantly in the bilateral perirolandic area-differentials include hypoxic ischemic injury, uremic and other toxic metabolic encephalopathies.  Minimal to none T2/flair correlate  2D echocardiogram unremarkable  Assessment:  30 year old man, past history of IV drug use presenting for unresponsiveness in the setting of amphetamine use.  Neurological examination was notable for obtundation despite being off of sedation and has intact brainstem reflexes which he continues to maintain. CT head with no acute abnormality-although MRI with subtle confluent areas of mildly restricted diffusion in the bilateral perirolandic area indicating possible hypoxic ischemic damage. Noted to have generalized tonic-clonic shaking in the ED for which she was  hooked up to continuous EEG after loading with Keppra. No seizures on the EEG Differentials:-Toxic metabolic encephalopathy in the setting of for substance abuse, hypoxic ischemic encephalopathy, AKI rhabdomyolysis, provoked seizure in the setting of drug abuse, rule out septic emboli given IVDU, CNS infection  Continuous EEG negative for seizures LP unsuccessful at bedside x2  Recommendations:  Attempt to minimize sedation  Blood pressure management per primary team-might have component of withdrawal.  Consider standing doses of opiates and small doses of benzodiazepines without starting drips if possible.  Empiric meningitic and encephalitic coverage  Possible IR guided LP with cell count, glucose, protein, HSV PCR, gram stain.  No need for AEDs   Discussed my plan with Hillery Aldo, NP, PCCM service - on the unit Will follow.  -- Amie Portland, MD Neurologist [facility] Pager: [phone]   CRITICAL CARE ATTESTATION Performed by: Amie Portland, MD Total critical care time: 35 minutes Critical care time was exclusive of separately billable procedures and treating other patients and/or supervising APPs/Residents/Students Critical care was necessary to treat or prevent imminent or life-threatening deterioration due to toxic metabolic encephalopathy, hypoxic ischemic encephalopathy, This patient is critically ill and at significant risk for neurological worsening and/or death and care requires constant monitoring. Critical care was time spent personally by me on the following activities: development of treatment plan with patient and/or surrogate as well as nursing, discussions with consultants, evaluation of patient's response to treatment, examination of patient, obtaining history from patient or surrogate, ordering and performing treatments and interventions, ordering and review of laboratory studies, ordering and review of radiographic studies, pulse oximetry,  re-evaluation of patient's condition, participation in multidisciplinary rounds and medical decision making of high complexity in the care of this patient.

## 2020-11-13 NOTE — Progress Notes (Signed)
NAME:  Nathaniel Lynn, MRN:  [id number], DOB:  01/01/1991, LOS: 3 ADMISSION DATE:  11/10/2020, CONSULTATION DATE:  11/11/20 REFERRING MD:  Valeria Batman, CHIEF COMPLAINT:   hypoxia  History of Present Illness:  [name]. Nathaniel Lynn is a 30 year old young man with PMH significant for narcotic drug abuse.  The patient's mother reported that the patient had been released from prison 2 weeks ago after serving a 2 year sentence.  Since his release, he has been involved in drug abuse with his girlfriend.    The patient's mother reported that her daughter called her around 40 AM reporting that she was unable to reach her brother as he was not answering his phone.  She told her mother that the patient's probation officer called her to report that the patient's ankle monitoring device had sounded an alarm and the probation officer was unable to reach her brother. The daughter tried several times and when she could not reach her brother she called her mother at 19 AM who was already at work.   The sister was finally able to get another family member to go to the patient's girlfriend's house in search of her brother.  When she arrived around 3 pm, she found her brother unresponsive.   The girlfriend confirmed that they were getting "high". The girlfriend reported that she had given the patient narcan but she was still unable to arouse him.  Hence at that time 911 was called and the patient was taken to [facility] for evaluation. It is reported that the initial blood sugar was 140 in the field. [facility] nurse also documented that patient initially localized to painful stimuli.  The patient's toxicology screen came back positive for Amphetamines, opiates and cannabinoid. He was found to have acute kidney injury and CK level was > 50 thousand.  CT head was negative for acute pathology and lactic acid was elevated.  While in the ED, the patient was given Ativan x 2 for tonic-clonic seizure like activity.  The patient was  intubated shortly after seizure like activity was noted.  He was also found to be " posturing" therefore the neurologist recommended transfer to [facility] for continuous EEG monitoring.  I have seen and examine the patient.  He is unable to contribute to this consultation.  Hence this consultation was done with the assistance of the patient's mother and review of [facility] records.  Pertinent  Medical History  Drug abuse  Significant Hospital Events: Including procedures, antibiotic start and stop dates in addition to other pertinent events   . 5/14 admitted, intubated, transferred to [facility]. Started on empiric abx- vanc, cefepime, flagyl . 5/16 change cefepime to ceftriaxone, unable to get CSF from lumbar puncture, developed intermittent hypertension (SBP went up to 208)   Interim History / Subjective:  Developed hypertension overnight and was given hydralazine 20mg  and cardene.  Unable to get it due to patient's status.  Objective   Blood pressure 125/75, pulse 95, temperature 98.4 F (36.9 C), resp. rate 16, weight 94.7 kg, SpO2 100 %.    Vent Mode: PRVC FiO2 (%):  [30 %] 30 % Set Rate:  [16 bmp] 16 bmp Vt Set:  [500 mL] 500 mL PEEP:  [5 cmH20] 5 cmH20 Plateau Pressure:  [12 cmH20] 12 cmH20   Intake/Output Summary (Last 24 hours) at 11/13/2020 0852 Last data filed at 11/13/2020 0800 Gross per 24 hour  Intake 6775.84 ml  Output 925 ml  Net 5850.84 ml   11/15/2020  11/13/20 0500  Weight: 94.7 kg    Examination: General: critically ill appearing man lying in bed in NAD, intubated, sedated HEENT: on ETT and NG tube, norma pupillary reflex Lungs: bilateral vesicular breath sound Cardiovascular: S1S2, RRR Abdomen: soft, nondistended and non tender Extremities: healed skin popping scars on left thigh, ankle tracking device R ankle with mild area of erythema and edema just distal to it on his shin. Tattoos.  Neuro: eyes closed, does not follow commands, withdrawing in bilateral upper  extremity GU: foley draining yellow colored urine Skin: coalescing pustular lesion under the right corner of mouth and left jaw, on umbilicus  Labs/imaging that I havepersonally reviewed  (right click and "Reselect all SmartList Selections" daily)   Creat 2.38>3.33>3.52 BUN 25>31>36 CK ~50,000 Trop 2806> 6533 Blood culture (1 site) negative  HIV nonreactive Ultrasound does not show hydronephrosis MRI showed nonspecific changes EEG was insignificant for seizure and no slow wave detected Echo was not suggestive of infective endocarditis Resolved Hospital Problem list   Lactic acidosis  Assessment & Plan:  Acute encephalopathy due to drug overdose -appreciate neurology consult -MRI show unspecific changes -plan for lumbar puncture today -started on clonidine for opioid withdrawal  meningitis/ encephalitis? -con't ceftriaxone, acyclovir & vancomycin -plan for LP  -follow culture until finalized  Acute hypoxic respiratory failure  -lung-protective ventilation -VAP prevention protocol -PAD protocol for sedation; goal 0 to -1 -daily SAT & SBT  AKI due to rhabdomyolysis -on IV lactated ringers -trend I/Os -renally dose meds -avoid nephrotoxic meds  Electrolyte imbalance -hypokalemia K 3.0 -corrected Ca is low at 8.72  -given potassium chloride -trend Mg, K and Ca  Hypertension, likely due to drug withdrawal - on clonidine - PRN IV hydralazine  Trop elevation, likely due to rhabdo -echo pending -tele monitoring -con't aspirin; no statin due to rhabdo  Elevated transaminases - likely secondary to rhabdo  IVDU -HIV was nonreactive -echocardiogram for possible endocarditis -will counsel to avoid this when stable  Low albumin - albumin was low at 2.1 - dietician consult   Best practice (right click and "Reselect all SmartList Selections" daily)  Diet:  Tube Feed  Pain/Anxiety/Delirium protocol (if indicated): Yes (RASS goal -1) VAP protocol (if  indicated): Yes DVT prophylaxis: LMWH GI prophylaxis: PPI Glucose control:  SSI Yes Central venous access:  N/A Arterial line:  N/A Foley:  Yes, and it is still needed Mobility:  bed rest  PT consulted: N/A Last date of multidisciplinary goals of care discussion [ ]  Code Status:  full code Disposition: ICU   Labs   CBC: Recent Labs  Lab 11/10/20 1551 11/11/20 0131  WBC 15.3* 9.7  NEUTROABS 12.1*  --   HGB 16.6 16.6  HCT 48.1 48.3  MCV 89.6 89.8  PLT 312 224    Basic Metabolic Panel: Recent Labs  Lab 11/10/20 1551 11/11/20 0131 11/12/20 0132 11/13/20 0700  NA 137 136 136 138  K 4.8 4.2 3.8 3.0*  CL 97* 103 98 103  CO2 23 22 29 30   GLUCOSE 126* 104* 127* 148*  BUN 24* 25* 31* 36*  CREATININE 2.33* 2.38* 3.33* 3.52*  CALCIUM 9.0 8.3* 8.1* 7.2*  MG  --  2.2 2.4 2.2  PHOS  --  4.8* 4.2 3.9   GFR: Estimated Creatinine Clearance: 36.1 mL/min (A) (by C-G formula based on SCr of 3.52 mg/dL (H)). Recent Labs  Lab 11/10/20 1551 11/10/20 1800 11/10/20 1952 11/11/20 0131 11/11/20 0436  WBC 15.3*  --   --  9.7  --  LATICACIDVEN  --  3.0* 3.0* 2.0* 1.5    Liver Function Tests: Recent Labs  Lab 11/10/20 1551 11/11/20 0131 11/13/20 0700  AST 669* 672* 252*  ALT 265* 280* 146*  ALKPHOS 93 75 47  BILITOT 1.1 1.1 0.5  PROT 8.8* 6.7 4.7*  ALBUMIN 4.8 3.5 2.1*   Recent Labs  Lab 11/11/20 0131  LIPASE 25   No results for input(s): AMMONIA in the last 168 hours.  ABG    Component Value Date/Time   PHART 7.40 11/10/2020 1720   PCO2ART 34 11/10/2020 1720   PO2ART 178 (H) 11/10/2020 1720   HCO3 21.1 11/10/2020 1720   O2SAT 99.6 11/10/2020 1720     Coagulation Profile: No results for input(s): INR, PROTIME in the last 168 hours.  Cardiac Enzymes: Recent Labs  Lab 11/10/20 1630 11/11/20 0131 11/12/20 0739 11/13/20 0700  CKTOTAL >50,000* 47,139* 15,401* 11,430*    HbA1C: Hgb A1c MFr Bld  Date/Time Value Ref Range Status  11/11/2020 01:31 AM  5.1 4.8 - 5.6 % Final    Comment:    (NOTE) Pre diabetes:          5.7%-6.4%  Diabetes:              >6.4%  Glycemic control for   <7.0% adults with diabetes     CBG: Recent Labs  Lab 11/12/20 1528 11/12/20 1928 11/12/20 2322 11/13/20 0336 11/13/20 0740  GLUCAP 104* 104* 129* 167* 151*

## 2020-11-13 NOTE — Progress Notes (Signed)
RT NOTES: Wean stopped at this time d/t HR being in the 130s. Pt back on full support at this time.

## 2020-11-14 ENCOUNTER — Inpatient Hospital Stay (HOSPITAL_COMMUNITY): Payer: Self-pay

## 2020-11-14 DIAGNOSIS — R609 Edema, unspecified: Secondary | ICD-10-CM

## 2020-11-14 LAB — GLUCOSE, CAPILLARY
Glucose-Capillary: 123 mg/dL — ABNORMAL HIGH (ref 70–99)
Glucose-Capillary: 124 mg/dL — ABNORMAL HIGH (ref 70–99)
Glucose-Capillary: 131 mg/dL — ABNORMAL HIGH (ref 70–99)
Glucose-Capillary: 137 mg/dL — ABNORMAL HIGH (ref 70–99)
Glucose-Capillary: 149 mg/dL — ABNORMAL HIGH (ref 70–99)
Glucose-Capillary: 152 mg/dL — ABNORMAL HIGH (ref 70–99)

## 2020-11-14 LAB — CBC
HCT: 31.7 % — ABNORMAL LOW (ref 39.0–52.0)
Hemoglobin: 10.3 g/dL — ABNORMAL LOW (ref 13.0–17.0)
MCH: 30.5 pg (ref 26.0–34.0)
MCHC: 32.5 g/dL (ref 30.0–36.0)
MCV: 93.8 fL (ref 80.0–100.0)
Platelets: 163 10*3/uL (ref 150–400)
RBC: 3.38 MIL/uL — ABNORMAL LOW (ref 4.22–5.81)
RDW: 12.6 % (ref 11.5–15.5)
WBC: 7.1 10*3/uL (ref 4.0–10.5)
nRBC: 0 % (ref 0.0–0.2)

## 2020-11-14 LAB — BASIC METABOLIC PANEL
Anion gap: 7 (ref 5–15)
BUN: 42 mg/dL — ABNORMAL HIGH (ref 6–20)
CO2: 28 mmol/L (ref 22–32)
Calcium: 7.5 mg/dL — ABNORMAL LOW (ref 8.9–10.3)
Chloride: 105 mmol/L (ref 98–111)
Creatinine, Ser: 3.46 mg/dL — ABNORMAL HIGH (ref 0.61–1.24)
GFR, Estimated: 24 mL/min — ABNORMAL LOW (ref >60)
Glucose, Bld: 112 mg/dL — ABNORMAL HIGH (ref 70–99)
Potassium: 3.5 mmol/L (ref 3.5–5.1)
Sodium: 140 mmol/L (ref 135–145)

## 2020-11-14 LAB — PHOSPHORUS: Phosphorus: 3.2 mg/dL (ref 2.5–4.6)

## 2020-11-14 LAB — CK: Total CK: 8856 U/L — ABNORMAL HIGH (ref 49–397)

## 2020-11-14 LAB — MAGNESIUM: Magnesium: 2.3 mg/dL (ref 1.7–2.4)

## 2020-11-14 MED ORDER — HALOPERIDOL LACTATE 5 MG/ML IJ SOLN
5.0000 mg | Freq: Once | INTRAMUSCULAR | Status: AC
  Administered 2020-11-14: 5 mg via INTRAVENOUS
  Filled 2020-11-14: qty 1

## 2020-11-14 MED ORDER — LACTATED RINGERS IV BOLUS
1000.0000 mL | Freq: Once | INTRAVENOUS | Status: AC
  Administered 2020-11-14: 1000 mL via INTRAVENOUS

## 2020-11-14 MED ORDER — CLONIDINE HCL 0.2 MG PO TABS
0.2000 mg | ORAL_TABLET | Freq: Three times a day (TID) | ORAL | Status: DC
  Administered 2020-11-14 (×3): 0.2 mg
  Filled 2020-11-14 (×2): qty 1

## 2020-11-14 MED ORDER — POTASSIUM CHLORIDE 10 MEQ/100ML IV SOLN
10.0000 meq | INTRAVENOUS | Status: AC
  Administered 2020-11-14 (×3): 10 meq via INTRAVENOUS
  Filled 2020-11-14 (×3): qty 100

## 2020-11-14 MED ORDER — HYDRALAZINE HCL 20 MG/ML IJ SOLN
10.0000 mg | INTRAMUSCULAR | Status: DC | PRN
  Administered 2020-11-14: 20 mg via INTRAVENOUS
  Administered 2020-11-16 (×3): 10 mg via INTRAVENOUS
  Administered 2020-11-17 (×2): 20 mg via INTRAVENOUS
  Administered 2020-11-17: 10 mg via INTRAVENOUS
  Administered 2020-11-18 – 2020-11-19 (×3): 20 mg via INTRAVENOUS
  Administered 2020-11-23: 10 mg via INTRAVENOUS
  Filled 2020-11-14 (×10): qty 1

## 2020-11-14 MED ORDER — DEXMEDETOMIDINE HCL IN NACL 400 MCG/100ML IV SOLN
0.4000 ug/kg/h | INTRAVENOUS | Status: DC
  Administered 2020-11-14: 0.8 ug/kg/h via INTRAVENOUS
  Administered 2020-11-14 – 2020-11-15 (×2): 1.2 ug/kg/h via INTRAVENOUS
  Administered 2020-11-15: 1.8 ug/kg/h via INTRAVENOUS
  Filled 2020-11-14 (×3): qty 100

## 2020-11-14 MED ORDER — FENTANYL CITRATE (PF) 100 MCG/2ML IJ SOLN
INTRAMUSCULAR | Status: AC
  Administered 2020-11-14: 50 ug
  Filled 2020-11-14: qty 2

## 2020-11-14 NOTE — Progress Notes (Signed)
eLink Physician-Brief Progress Note Patient Name: WILFRIDO LUEDKE DOB: [birth year] MRN: [id number]   Date of Service  11/14/2020  HPI/Events of Note  Per bedside RN patient having copious diarrhea.  eICU Interventions  Flexiseal ordered.        Thomasene Lot Deryn Massengale 11/14/2020, 8:07 PM

## 2020-11-14 NOTE — Progress Notes (Signed)
RT NOTES: Transported patient to fluoroscopy for LP, patient unable to tolerate procedure due to agitation. Patient transported back to room 4N21 on vent, no complications.

## 2020-11-14 NOTE — Progress Notes (Signed)
eLink Physician-Brief Progress Note Patient Name: Nathaniel Lynn DOB: 01-02-1991 MRN: [id number]   Date of Service  11/14/2020  HPI/Events of Note  Patient became extremely agitated on 1.2 mcg of Precedex, sedation is being limited to facilitate mental status assessments.  eICU Interventions  Precedex ceiling increased to 1.8 PRN treatment of extreme agitation.        Nathaniel Lynn 11/14/2020, 4:12 AM

## 2020-11-14 NOTE — Progress Notes (Signed)
Neurology Progress Note   S://  Seen and examined. Very agitated off sedation.  On Precedex for sedation   O:// Current vital signs: BP (!) 155/82   Pulse 68   Temp 98 F (36.7 C) (Axillary)   Resp 17   Wt 98.6 kg Comment: bed scale  SpO2 100%   BMI 31.19 kg/m  Vital signs in last 24 hours: Temp:  [98 F (36.7 C)-99.2 F (37.3 C)] 98 F (36.7 C) (05/18 0800) Pulse Rate:  [68-133] 68 (05/18 0730) Resp:  [9-30] 17 (05/18 0730) BP: (118-166)/(67-93) 155/82 (05/18 0730) SpO2:  [96 %-100 %] 100 % (05/18 0730) FiO2 (%):  [30 %] 30 % (05/18 0730) Weight:  [98.6 kg] 98.6 kg (05/18 0500) General: Intubated, sedated on minimal dose of Precedex at the time. HEENT: Normocephalic, atraumatic, multiple rashes on the face. Lungs: Vented Cardiovascular: Regular rate rhythm Abdomen soft nondistended nontender Extremities: No edema, multiple rashes on arms and legs Neurologic exam Mental status: Intubated, on minimal sedation with Precedex. Precedex was held for exam. No spontaneous movement No response to voice. Cranials: Pupils 2 mm sluggish bilaterally, mildly disconjugate gaze, positive corneals, breathing over the ventilator, difficult to ascertain facial symmetry. Motor exam: Minimal movement in bilateral lower extremities to extreme noxious stimulation to the inner thighs-question triple flexion versus true withdrawal. Sensory exam: As above Coordination cannot be assessed Unchanged exam from yesterday within confines of sedation  Medications  Current Facility-Administered Medications:  .  0.9 %  sodium chloride infusion, , Intravenous, PRN, Jacky Kindle, MD, Last Rate: 10 mL/hr at 11/14/20 0847, 500 mL at 11/14/20 0847 .  acyclovir (ZOVIRAX) 780 mg in dextrose 5 % 150 mL IVPB, 10 mg/kg, Intravenous, Q12H, von Caren Griffins, RPH, Stopped at 11/13/20 2353 .  cefTRIAXone (ROCEPHIN) 2 g in sodium chloride 0.9 % 100 mL IVPB, 2 g, Intravenous, Q12H, Estill Cotta, NP,  Stopped at 11/13/20 2250 .  chlorhexidine gluconate (MEDLINE KIT) (PERIDEX) 0.12 % solution 15 mL, 15 mL, Mouth Rinse, BID, Stretch, Marily Lente, MD, 15 mL at 11/14/20 0750 .  Chlorhexidine Gluconate Cloth 2 % PADS 6 each, 6 each, Topical, Daily, Julian Hy, DO, 6 each at 11/14/20 0400 .  cloNIDine (CATAPRES) tablet 0.1 mg, 0.1 mg, Per Tube, BID, Estill Cotta, NP, 0.1 mg at 11/13/20 2212 .  dexmedetomidine (PRECEDEX) 400 MCG/100ML (4 mcg/mL) infusion, 0.2-1.8 mcg/kg/hr, Intravenous, Titrated, Ogan, Okoronkwo U, MD, Last Rate: 18.94 mL/hr at 11/14/20 0751, 0.8 mcg/kg/hr at 11/14/20 0751 .  docusate (COLACE) 50 MG/5ML liquid 100 mg, 100 mg, Per Tube, BID, Lafayette Dragon, MD, 100 mg at 11/13/20 0943 .  docusate (COLACE) 50 MG/5ML liquid 100 mg, 100 mg, Per Tube, BID PRN, Jacky Kindle, MD .  feeding supplement (PROSource TF) liquid 45 mL, 45 mL, Per Tube, BID, Jacky Kindle, MD, 45 mL at 11/13/20 2212 .  feeding supplement (VITAL 1.5 CAL) liquid 1,000 mL, 1,000 mL, Per Tube, Continuous, Chand, Sudham, MD, Last Rate: 65 mL/hr at 11/14/20 0542, 1,000 mL at 11/14/20 0542 .  folic acid (FOLVITE) tablet 1 mg, 1 mg, Per Tube, Daily, Noemi Chapel P, DO, 1 mg at 11/13/20 0943 .  haloperidol lactate (HALDOL) injection 5 mg, 5 mg, Intravenous, Once, Chand, Sudham, MD .  hydrALAZINE (APRESOLINE) injection 10-20 mg, 10-20 mg, Intravenous, Q4H PRN, Frederik Pear, MD, 20 mg at 11/14/20 0840 .  insulin aspart (novoLOG) injection 0-6 Units, 0-6 Units, Subcutaneous, Q4H, Lafayette Dragon, MD, 1 Units at 11/13/20 2359 .  ipratropium-albuterol (DUONEB) 0.5-2.5 (3) MG/3ML nebulizer solution 3 mL, 3 mL, Nebulization, Q6H PRN, Lafayette Dragon, MD .  lactated ringers infusion, , Intravenous, Continuous, Estill Cotta, NP, Last Rate: 150 mL/hr at 11/14/20 0700, Infusion Verify at 11/14/20 0700 .  MEDLINE mouth rinse, 15 mL, Mouth Rinse, 10 times per day, Stretch, Marily Lente, MD, 15 mL at 11/14/20 0553 .   multivitamin with minerals tablet 1 tablet, 1 tablet, Per Tube, Daily, Jacky Kindle, MD, 1 tablet at 11/13/20 0943 .  nicardipine (CARDENE) 38m in 0.86% saline 2066mIV infusion (0.1 mg/ml), 0-15 mg/hr, Intravenous, Continuous, Ogan, OkKerry KassMD, Stopped at 11/13/20 0606 .  pantoprazole sodium (PROTONIX) 40 mg/20 mL oral suspension 40 mg, 40 mg, Per Tube, Daily, ChJacky KindleMD, 40 mg at 11/13/20 0942 .  polyethylene glycol (MIRALAX / GLYCOLAX) packet 17 g, 17 g, Per Tube, Daily, RiLafayette DragonMD, 17 g at 11/13/20 0942 .  polyethylene glycol (MIRALAX / GLYCOLAX) packet 17 g, 17 g, Per Tube, Daily PRN, ChTacy LearnSudham, MD .  polyvinyl alcohol (LIQUIFILM TEARS) 1.4 % ophthalmic solution 1 drop, 1 drop, Both Eyes, TID, RiLafayette DragonMD, 1 drop at 11/13/20 2210 .  sodium chloride flush (NS) 0.9 % injection 10-40 mL, 10-40 mL, Intracatheter, Q12H, Chand, Sudham, MD, 10 mL at 11/13/20 2220 .  sodium chloride flush (NS) 0.9 % injection 10-40 mL, 10-40 mL, Intracatheter, PRN, ChJacky KindleMD .  thiamine tablet 100 mg, 100 mg, Per Tube, Daily, ClNoemi Chapel, DO, 100 mg at 11/13/20 0942 .  vancomycin (VANCOREADY) IVPB 1000 mg/200 mL, 1,000 mg, Intravenous, Q24H, von DoCaren GriffinsRPH, Stopped at 11/13/20 1300 Labs CBC    Component Value Date/Time   WBC 7.1 11/14/2020 0532   RBC 3.38 (L) 11/14/2020 0532   HGB 10.3 (L) 11/14/2020 0532   HCT 31.7 (L) 11/14/2020 0532   PLT 163 11/14/2020 0532   MCV 93.8 11/14/2020 0532   MCH 30.5 11/14/2020 0532   MCHC 32.5 11/14/2020 0532   RDW 12.6 11/14/2020 0532   LYMPHSABS 1.2 11/10/2020 1551   MONOABS 1.9 (H) 11/10/2020 1551   EOSABS 0.0 11/10/2020 1551   BASOSABS 0.0 11/10/2020 1551    CMP     Component Value Date/Time   NA 140 11/14/2020 0532   K 3.5 11/14/2020 0532   CL 105 11/14/2020 0532   CO2 28 11/14/2020 0532   GLUCOSE 112 (H) 11/14/2020 0532   BUN 42 (H) 11/14/2020 0532   CREATININE 3.46 (H) 11/14/2020 0532   CALCIUM 7.5  (L) 11/14/2020 0532   PROT 4.7 (L) 11/13/2020 0700   ALBUMIN 2.1 (L) 11/13/2020 0700   AST 252 (H) 11/13/2020 0700   ALT 146 (H) 11/13/2020 0700   ALKPHOS 47 11/13/2020 0700   BILITOT 0.5 11/13/2020 0700   GFRNONAA 24 (L) 11/14/2020 0532  UDS +opiates, +amphetamines, +THC Blood cultures unremarkable thus far  Creatinine 3.33 yesterday-today's labs pending. CK down to 8856 Troponins 1848--> 2806--> 6533.  On 11/11/2020.  Imaging I have reviewed images in epic and the results pertinent to this consultation are: CT-scan of the brain IMPRESSION: Normal appearing brain parenchyma. No mass, hemorrhage, or extra-axial fluid collection. Small right frontal scalp hematoma with underlying bony calvarium intact. Paranasal sinus disease at several sites noted.  Agree with rads report on personal review.  MRI brain completed yesterday with subtle confluent areas of mildly restricted diffusion predominantly in the bilateral perirolandic area-differentials include hypoxic ischemic injury, uremic and other toxic metabolic  encephalopathies.  Minimal to none T2/flair correlate  2D echocardiogram unremarkable  Assessment:  30 year old man, past history of IV drug use presenting for unresponsiveness in the setting of amphetamine use.  Neurological examination was notable for obtundation despite being off of sedation and has intact brainstem reflexes which he continues to maintain. CT head with no acute abnormality-although MRI with subtle confluent areas of mildly restricted diffusion in the bilateral perirolandic area indicating possible hypoxic ischemic damage. Noted to have generalized tonic-clonic shaking in the ED for which she was hooked up to continuous EEG after loading with Keppra. No seizures on the EEG Differentials:- Toxic metabolic encephalopathy in the setting of substance use and hypoxic ischemic encephalopathy.  Continuous EEG negative for seizures LP unsuccessful at bedside  x2  Recommendations:  Attempt to minimize sedation  Blood pressure management per primary team-might have component of withdrawal.  Management of withdrawal per primary team as you are.  Empiric meningitic and encephalitic coverage  Possible IR guided LP with cell count, glucose, protein, HSV PCR, gram stain.  No need for AEDs   Discussed my plan with Dr. Tacy Learn  Will follow.  -- Amie Portland, MD Neurologist [facility] Pager: [phone]   CRITICAL CARE ATTESTATION Performed by: Amie Portland, MD Total critical care time: 31 minutes Critical care time was exclusive of separately billable procedures and treating other patients and/or supervising APPs/Residents/Students Critical care was necessary to treat or prevent imminent or life-threatening deterioration due to toxic metabolic encephalopathy, hypoxic ischemic encephalopathy, This patient is critically ill and at significant risk for neurological worsening and/or death and care requires constant monitoring. Critical care was time spent personally by me on the following activities: development of treatment plan with patient and/or surrogate as well as nursing, discussions with consultants, evaluation of patient's response to treatment, examination of patient, obtaining history from patient or surrogate, ordering and performing treatments and interventions, ordering and review of laboratory studies, ordering and review of radiographic studies, pulse oximetry, re-evaluation of patient's condition, participation in multidisciplinary rounds and medical decision making of high complexity in the care of this patient.

## 2020-11-14 NOTE — Progress Notes (Signed)
eLink Physician-Brief Progress Note Patient Name: Nathaniel Lynn DOB: 1991-03-15 MRN: [id number]   Date of Service  11/14/2020  HPI/Events of Note  Patient needs a PRN medication order for BP control.  eICU Interventions  PRN Hydralazine ordered.        Thomasene Lot Jacqualin Shirkey 11/14/2020, 11:19 PM

## 2020-11-14 NOTE — Progress Notes (Addendum)
NAME:  Nathaniel Lynn, MRN:  [id number], DOB:  05-07-1991, LOS: 4 ADMISSION DATE:  11/10/2020, CONSULTATION DATE:  11/11/20 REFERRING MD:  Valeria Batman, CHIEF COMPLAINT:   hypoxia  History of Present Illness:  [name] is a 30 year old young man with PMH significant for narcotic drug abuse.  The patient's mother reported that the patient had been released from prison 2 weeks ago after serving a 2 year sentence.  Since his release, he has been involved in drug abuse with his girlfriend.    The patient's mother reported that her daughter called her around 24 AM reporting that she was unable to reach her brother as he was not answering his phone.  She told her mother that the patient's probation officer called her to report that the patient's ankle monitoring device had sounded an alarm and the probation officer was unable to reach her brother. The daughter tried several times and when she could not reach her brother she called her mother at 71 AM who was already at work.   The sister was finally able to get another family member to go to the patient's girlfriend's house in search of her brother.  When she arrived around 3 pm, she found her brother unresponsive.   The girlfriend confirmed that they were getting "high". The girlfriend reported that she had given the patient narcan but she was still unable to arouse him.  Hence at that time 911 was called and the patient was taken to [facility] for evaluation. It is reported that the initial blood sugar was 140 in the field. [facility] nurse also documented that patient initially localized to painful stimuli.  The patient's toxicology screen came back positive for Amphetamines, opiates and cannabinoid. He was found to have acute kidney injury and CK level was > 50 thousand.  CT head was negative for acute pathology and lactic acid was elevated.  While in the ED, the patient was given Ativan x 2 for tonic-clonic seizure like activity.  The patient was  intubated shortly after seizure like activity was noted.  He was also found to be " posturing" therefore the neurologist recommended transfer to [facility] for continuous EEG monitoring.  I have seen and examine the patient.  He is unable to contribute to this consultation.  Hence this consultation was done with the assistance of the patient's mother and review of [facility] records.  Pertinent  Medical History  Drug abuse  Significant Hospital Events: Including procedures, antibiotic start and stop dates in addition to other pertinent events   . 5/14 admitted, intubated, transferred to [facility]. Started on empiric abx- vanc, cefepime, flagyl . 5/16 change cefepime to ceftriaxone, unable to get CSF from lumbar puncture, developed intermittent hypertension (SBP went up to 208)   Interim History / Subjective:  Become agitated even on 1.2 mcg of Precedex.  Unable to get it due to patient's status.  Objective   Blood pressure (!) 155/82, pulse 68, temperature 98.4 F (36.9 C), temperature source Axillary, resp. rate 17, weight 98.6 kg, SpO2 100 %.    Vent Mode: PSV;CPAP FiO2 (%):  [30 %] 30 % Set Rate:  [16 bmp] 16 bmp Vt Set:  [500 mL-580 mL] 580 mL PEEP:  [5 cmH20] 5 cmH20 Pressure Support:  [8 cmH20] 8 cmH20 Plateau Pressure:  [11 cmH20-13 cmH20] 13 cmH20   Intake/Output Summary (Last 24 hours) at 11/14/2020 0841 Last data filed at 11/14/2020 0700 Gross per 24 hour  Intake 5800.9 ml  Output  725 ml  Net 5075.9 ml   Filed Weights   11/13/20 0500 11/14/20 0500  Weight: 94.7 kg 98.6 kg    Examination: General: critically ill appearing man lying in bed in NAD, intubated, sedated HEENT: on ETT and NG tube, normal pupillary reflex Lungs: bilateral vesicular breath sound Cardiovascular: S1S2, RRR Abdomen: soft, nondistended and non tender Extremities: healed skin popping scars on left thigh, ankle tracking device R ankle with mild area of erythema and edema just distal to it on his shin. Tattoos.   Neuro: eyes closed, does not follow commands, withdrawing in bilateral upper extremity Skin: coalescing pustular lesion under the right corner of mouth and left jaw, on umbilicus  Labs/imaging that I havepersonally reviewed  (right click and "Reselect all SmartList Selections" daily)  Creat 3.65>3.46 BUN 39>42 Blood culture (1 site) negative  Resolved Hospital Problem list   Lactic acidosis Electrolyte imbalance Assessment & Plan:  Acute encephalopathy due to drug overdose -appreciate neurology consult -MRI show unspecific changes -plan for lumbar puncture  -started on clonidine for opioid withdrawal  meningitis/ encephalitis? -con't ceftriaxone, acyclovir & vancomycin -plan for LP  -follow culture until finalized  Acute hypoxic respiratory failure  -lung-protective ventilation -VAP prevention protocol -PAD protocol for sedation; goal 0 to -1 -daily SAT & SBT  AKI due to rhabdomyolysis -on IV lactated ringers for good renal perfusion -trend creat and BUN  -trend I/Os -renally dose meds -avoid nephrotoxic meds  Hypertension, likely due to drug withdrawal - on clonidine - PRN IV hydralazine  Trop elevation, likely due to rhabdo -echo pending -tele monitoring -con't aspirin; no statin due to rhabdo  Elevated transaminases - likely secondary to rhabdo  IVDU -HIV was nonreactive -echocardiogram for possible endocarditis -will counsel to avoid this when stable  Low albumin - albumin was low at 2.1 - dietician consult   Best practice (right click and "Reselect all SmartList Selections" daily)  Diet:  Tube Feed  Pain/Anxiety/Delirium protocol (if indicated): Yes (RASS goal -1) VAP protocol (if indicated): Yes DVT prophylaxis: LMWH GI prophylaxis: PPI Glucose control:  SSI Yes Central venous access:  N/A Arterial line:  N/A Foley:  Yes, and it is still needed Mobility:  bed rest  PT consulted: N/A Last date of multidisciplinary goals of care discussion [  ] Code Status:  full code Disposition: ICU   Labs   CBC: Recent Labs  Lab 11/10/20 1551 11/11/20 0131 11/14/20 0532  WBC 15.3* 9.7 7.1  NEUTROABS 12.1*  --   --   HGB 16.6 16.6 10.3*  HCT 48.1 48.3 31.7*  MCV 89.6 89.8 93.8  PLT 312 224 163    Basic Metabolic Panel: Recent Labs  Lab 11/11/20 0131 11/12/20 0132 11/13/20 0700 11/13/20 1424 11/14/20 0532  NA 136 136 138 139 140  K 4.2 3.8 3.0* 3.6 3.5  CL 103 98 103 101 105  CO2 22 29 30 29 28   GLUCOSE 104* 127* 148* 137* 112*  BUN 25* 31* 36* 39* 42*  CREATININE 2.38* 3.33* 3.52* 3.65* 3.46*  CALCIUM 8.3* 8.1* 7.2* 7.5* 7.5*  MG 2.2 2.4 2.2  --  2.3  PHOS 4.8* 4.2 3.9  --  3.2   GFR: Estimated Creatinine Clearance: 37.4 mL/min (A) (by C-G formula based on SCr of 3.46 mg/dL (H)). Recent Labs  Lab 11/10/20 1551 11/10/20 1800 11/10/20 1952 11/11/20 0131 11/11/20 0436 11/14/20 0532  WBC 15.3*  --   --  9.7  --  7.1  LATICACIDVEN  --  3.0* 3.0*  2.0* 1.5  --     Liver Function Tests: Recent Labs  Lab 11/10/20 1551 11/11/20 0131 11/13/20 0700  AST 669* 672* 252*  ALT 265* 280* 146*  ALKPHOS 93 75 47  BILITOT 1.1 1.1 0.5  PROT 8.8* 6.7 4.7*  ALBUMIN 4.8 3.5 2.1*   Recent Labs  Lab 11/11/20 0131  LIPASE 25   No results for input(s): AMMONIA in the last 168 hours.  ABG    Component Value Date/Time   PHART 7.40 11/10/2020 1720   PCO2ART 34 11/10/2020 1720   PO2ART 178 (H) 11/10/2020 1720   HCO3 21.1 11/10/2020 1720   O2SAT 99.6 11/10/2020 1720     Coagulation Profile: No results for input(s): INR, PROTIME in the last 168 hours.  Cardiac Enzymes: Recent Labs  Lab 11/10/20 1630 11/11/20 0131 11/12/20 0739 11/13/20 0700  CKTOTAL >50,000* 47,139* 15,401* 11,430*    HbA1C: Hgb A1c MFr Bld  Date/Time Value Ref Range Status  11/11/2020 01:31 AM 5.1 4.8 - 5.6 % Final    Comment:    (NOTE) Pre diabetes:          5.7%-6.4%  Diabetes:              >6.4%  Glycemic control for    <7.0% adults with diabetes     CBG: Recent Labs  Lab 11/13/20 1549 11/13/20 1933 11/13/20 2332 11/14/20 0326 11/14/20 0742  GLUCAP 119* 126* 156* 123* 131*

## 2020-11-14 NOTE — Progress Notes (Signed)
Bilateral upper extremity venous duplex completed. Refer to "CV Proc" under chart review to view preliminary results.  11/14/2020 3:11 PM Eula Fried., MHA, RVT, RDCS, RDMS

## 2020-11-15 ENCOUNTER — Inpatient Hospital Stay (HOSPITAL_COMMUNITY): Payer: Self-pay

## 2020-11-15 LAB — BASIC METABOLIC PANEL
Anion gap: 7 (ref 5–15)
BUN: 44 mg/dL — ABNORMAL HIGH (ref 6–20)
CO2: 25 mmol/L (ref 22–32)
Calcium: 7.6 mg/dL — ABNORMAL LOW (ref 8.9–10.3)
Chloride: 108 mmol/L (ref 98–111)
Creatinine, Ser: 3.33 mg/dL — ABNORMAL HIGH (ref 0.61–1.24)
GFR, Estimated: 25 mL/min — ABNORMAL LOW (ref >60)
Glucose, Bld: 129 mg/dL — ABNORMAL HIGH (ref 70–99)
Potassium: 3.5 mmol/L (ref 3.5–5.1)
Sodium: 140 mmol/L (ref 135–145)

## 2020-11-15 LAB — GLUCOSE, CAPILLARY
Glucose-Capillary: 156 mg/dL — ABNORMAL HIGH (ref 70–99)
Glucose-Capillary: 107 mg/dL — ABNORMAL HIGH (ref 70–99)
Glucose-Capillary: 109 mg/dL — ABNORMAL HIGH (ref 70–99)
Glucose-Capillary: 114 mg/dL — ABNORMAL HIGH (ref 70–99)
Glucose-Capillary: 106 mg/dL — ABNORMAL HIGH (ref 70–99)
Glucose-Capillary: 122 mg/dL — ABNORMAL HIGH (ref 70–99)

## 2020-11-15 LAB — CSF CELL COUNT WITH DIFFERENTIAL
RBC Count, CSF: 36 /mm3 — ABNORMAL HIGH
Tube #: 1
WBC, CSF: 4 /mm3 (ref 0–5)

## 2020-11-15 LAB — URINALYSIS, ROUTINE W REFLEX MICROSCOPIC
Bilirubin Urine: NEGATIVE
Glucose, UA: NEGATIVE mg/dL
Ketones, ur: NEGATIVE mg/dL
Leukocytes,Ua: NEGATIVE
Nitrite: NEGATIVE
Protein, ur: NEGATIVE mg/dL
Specific Gravity, Urine: 1.012 (ref 1.005–1.030)
pH: 5 (ref 5.0–8.0)

## 2020-11-15 LAB — CBC
HCT: 29.9 % — ABNORMAL LOW (ref 39.0–52.0)
Hemoglobin: 9.7 g/dL — ABNORMAL LOW (ref 13.0–17.0)
MCH: 31.4 pg (ref 26.0–34.0)
MCHC: 32.4 g/dL (ref 30.0–36.0)
MCV: 96.8 fL (ref 80.0–100.0)
Platelets: 164 10*3/uL (ref 150–400)
RBC: 3.09 MIL/uL — ABNORMAL LOW (ref 4.22–5.81)
RDW: 12.8 % (ref 11.5–15.5)
WBC: 13.7 10*3/uL — ABNORMAL HIGH (ref 4.0–10.5)
nRBC: 0 % (ref 0.0–0.2)

## 2020-11-15 LAB — GLUCOSE, CSF: Glucose, CSF: 75 mg/dL — ABNORMAL HIGH (ref 40–70)

## 2020-11-15 LAB — CULTURE, BLOOD (SINGLE)
Culture: NO GROWTH
Special Requests: ADEQUATE

## 2020-11-15 LAB — PROTEIN, CSF: Total  Protein, CSF: 15 mg/dL (ref 15–45)

## 2020-11-15 LAB — CK: Total CK: 7892 U/L — ABNORMAL HIGH (ref 49–397)

## 2020-11-15 LAB — PHOSPHORUS: Phosphorus: 2.7 mg/dL (ref 2.5–4.6)

## 2020-11-15 LAB — PROCALCITONIN: Procalcitonin: 3.65 ng/mL

## 2020-11-15 LAB — MAGNESIUM: Magnesium: 2 mg/dL (ref 1.7–2.4)

## 2020-11-15 MED ORDER — PROPOFOL 1000 MG/100ML IV EMUL
5.0000 ug/kg/min | INTRAVENOUS | Status: DC
  Administered 2020-11-15: 10 ug/kg/min via INTRAVENOUS
  Administered 2020-11-15: 5 ug/kg/min via INTRAVENOUS
  Administered 2020-11-15 – 2020-11-16 (×2): 20 ug/kg/min via INTRAVENOUS
  Administered 2020-11-16: 15 ug/kg/min via INTRAVENOUS
  Administered 2020-11-16: 10 ug/kg/min via INTRAVENOUS
  Administered 2020-11-16 – 2020-11-17 (×3): 25 ug/kg/min via INTRAVENOUS
  Filled 2020-11-15 (×9): qty 100

## 2020-11-15 MED ORDER — FUROSEMIDE 10 MG/ML IJ SOLN
60.0000 mg | Freq: Once | INTRAMUSCULAR | Status: AC
  Administered 2020-11-15: 60 mg via INTRAVENOUS
  Filled 2020-11-15: qty 6

## 2020-11-15 MED ORDER — MIDAZOLAM HCL 2 MG/2ML IJ SOLN
2.0000 mg | Freq: Once | INTRAMUSCULAR | Status: AC
  Administered 2020-11-15: 2 mg via INTRAVENOUS

## 2020-11-15 MED ORDER — ACETAMINOPHEN 325 MG PO TABS
650.0000 mg | ORAL_TABLET | Freq: Four times a day (QID) | ORAL | Status: DC | PRN
  Administered 2020-11-16 – 2020-12-03 (×11): 650 mg
  Filled 2020-11-15 (×11): qty 2

## 2020-11-15 MED ORDER — MIDAZOLAM HCL 2 MG/2ML IJ SOLN
2.0000 mg | INTRAMUSCULAR | Status: DC | PRN
  Filled 2020-11-15: qty 2

## 2020-11-15 MED ORDER — FENTANYL CITRATE (PF) 100 MCG/2ML IJ SOLN
50.0000 ug | INTRAMUSCULAR | Status: DC | PRN
  Administered 2020-11-15: 100 ug via INTRAVENOUS
  Filled 2020-11-15: qty 2

## 2020-11-15 MED ORDER — LACTATED RINGERS IV SOLN: INTRAVENOUS | Status: DC

## 2020-11-15 MED ORDER — FENTANYL CITRATE (PF) 100 MCG/2ML IJ SOLN
50.0000 ug | INTRAMUSCULAR | Status: AC | PRN
  Administered 2020-11-15 (×3): 50 ug via INTRAVENOUS
  Filled 2020-11-15 (×3): qty 2

## 2020-11-15 MED ORDER — FENTANYL CITRATE (PF) 100 MCG/2ML IJ SOLN
50.0000 ug | INTRAMUSCULAR | Status: DC | PRN
  Administered 2020-11-15 – 2020-11-17 (×6): 100 ug via INTRAVENOUS
  Administered 2020-11-18 (×2): 50 ug via INTRAVENOUS
  Administered 2020-11-20 – 2020-11-23 (×8): 100 ug via INTRAVENOUS
  Administered 2020-11-23: 50 ug via INTRAVENOUS
  Filled 2020-11-15 (×23): qty 2

## 2020-11-15 MED ORDER — MIDAZOLAM HCL 2 MG/2ML IJ SOLN
1.0000 mg | INTRAMUSCULAR | Status: AC | PRN
  Administered 2020-11-15 – 2020-11-16 (×3): 2 mg via INTRAVENOUS
  Filled 2020-11-15 (×3): qty 2

## 2020-11-15 MED ORDER — LIDOCAINE HCL (PF) 1 % IJ SOLN
5.0000 mL | Freq: Once | INTRAMUSCULAR | Status: AC
  Administered 2020-11-15: 5 mL via INTRADERMAL

## 2020-11-15 MED ORDER — ACETAMINOPHEN 325 MG PO TABS: 650.0000 mg | ORAL_TABLET | Freq: Four times a day (QID) | ORAL | Status: DC | PRN

## 2020-11-15 MED ORDER — QUETIAPINE FUMARATE 25 MG PO TABS
50.0000 mg | ORAL_TABLET | Freq: Two times a day (BID) | ORAL | Status: DC
  Administered 2020-11-15 – 2020-11-21 (×13): 50 mg
  Filled 2020-11-15 (×13): qty 2

## 2020-11-15 MED ORDER — CLONIDINE HCL 0.2 MG PO TABS
0.3000 mg | ORAL_TABLET | Freq: Three times a day (TID) | ORAL | Status: DC
  Administered 2020-11-15 – 2020-11-21 (×19): 0.3 mg
  Filled 2020-11-15 (×19): qty 1

## 2020-11-15 MED FILL — Fentanyl Citrate Preservative Free (PF) Inj 100 MCG/2ML: INTRAMUSCULAR | Qty: 2 | Status: AC

## 2020-11-15 NOTE — Progress Notes (Addendum)
E-Link notified by this RN of severe agitation and patient fighting ventilator. Breathing 40 times a minute. O2 saturation low 80s.  All PRN options used. Precedex drip at max rate. This RN requested another drip for sedation if MD deems it appropriate. Awaiting any new orders. Will continue to monitor.

## 2020-11-15 NOTE — Progress Notes (Signed)
eLink Physician-Brief Progress Note Patient Name: Nathaniel Lynn DOB: [birth year] MRN: [id number]   Date of Service  11/15/2020  HPI/Events of Note  Patient with extreme agitation despite Precedex at 1.8 mcg and PRN Versed and Fentanyl boluses, he desaturated into the 70's with the latest episode of delirium.  eICU Interventions  Precedex discontinued and patient placed on a Propofol gtt.        Thomasene Lot Ikaika Showers 11/15/2020, 5:38 AM

## 2020-11-15 NOTE — Progress Notes (Signed)
Spoke with RN at bedside and asked Elink MD Dr. Warrick Parisian to camera into the room since the ordered Fentanyl was ineffective. RN reporting patient is still tachycardic in the 110s and having tachypnea in the 30s. The patient also has pink frothy sputum in the tube and he discussed with RT and they said patient may be in flash pulmonary edema. Dr. Warrick Parisian ordered Versed instead and RN will continue to monitor patient.  Paraskevi Funez DNP elink RN [phone] AM

## 2020-11-15 NOTE — Progress Notes (Signed)
Patient transported to IR and back without complications. RN at bedside. ?

## 2020-11-15 NOTE — Progress Notes (Addendum)
eLink Physician-Brief Progress Note Patient Name: Nathaniel Lynn DOB: August 14, 1990 MRN: [id number]   Date of Service  11/15/2020  HPI/Events of Note  Patient became extremely agitated and dyssynchronous on the ventilator and would not settle down despite several boluses of iv Fentanyl. Patient is a drug abuser who may  be withdrawing from drugs.  eICU Interventions  Versed 2 mg iv PRN  was ordered as a last resort for extreme agitation only and Precedex ceiling was increased to 1.8.        Thomasene Lot Quintrell Baze 11/15/2020, 3:09 AM

## 2020-11-15 NOTE — Progress Notes (Addendum)
Neurology Progress Note   S://  Seen and examined. Restarted on propofol overnight due to extreme agitation. Increasing oxygen requirement  O:// Current vital signs: BP 128/65 (BP Location: Left Arm)   Pulse 90   Temp (!) 101.2 F (38.4 C) (Axillary)   Resp (!) 27   Wt 105.1 kg Comment: bed scale  SpO2 93%   BMI 33.25 kg/m  Vital signs in last 24 hours: Temp:  [98 F (36.7 C)-101.2 F (38.4 C)] 101.2 F (38.4 C) (05/19 0800) Pulse Rate:  [70-156] 90 (05/19 0800) Resp:  [12-44] 27 (05/19 0800) BP: (95-205)/(46-119) 128/65 (05/19 0800) SpO2:  [75 %-100 %] 93 % (05/19 0800) FiO2 (%):  [30 %-100 %] 100 % (05/19 0800) Weight:  [105.1 kg] 105.1 kg (05/19 0500) General: Intubated, sedated on propofol-held for sedation HEENT: Normocephalic, atraumatic, multiple rashes on the face. Lungs: Vented Cardiovascular: Regular rate rhythm Abdomen soft nondistended nontender Extremities: No edema, multiple rashes on arms and legs Neurologic exam Mental status: Intubated, on sedation with propofol. Precedex was held for exam. No spontaneous movement No response to voice. Cranials: Pupils 2 mm sluggish bilaterally, mildly disconjugate gaze, positive corneals, breathing over the ventilator, difficult to ascertain facial symmetry. Motor exam: No movement in any of the extremities to noxious stimulation Sensory exam: As above Coordination cannot be assessed Somewhat more sedated today compared to yesterday overall.  Medications  Current Facility-Administered Medications:  .  0.9 %  sodium chloride infusion, , Intravenous, PRN, Jacky Kindle, MD, Last Rate: 10 mL/hr at 11/14/20 0847, 500 mL at 11/14/20 0847 .  acyclovir (ZOVIRAX) 780 mg in dextrose 5 % 150 mL IVPB, 10 mg/kg, Intravenous, Q12H, von Caren Griffins, RPH, Stopped at 11/14/20 2345 .  cefTRIAXone (ROCEPHIN) 2 g in sodium chloride 0.9 % 100 mL IVPB, 2 g, Intravenous, Q12H, Estill Cotta, NP, Stopped at 11/14/20 2223 .   chlorhexidine gluconate (MEDLINE KIT) (PERIDEX) 0.12 % solution 15 mL, 15 mL, Mouth Rinse, BID, Stretch, Marily Lente, MD, 15 mL at 11/15/20 0847 .  Chlorhexidine Gluconate Cloth 2 % PADS 6 each, 6 each, Topical, Daily, Julian Hy, DO, 6 each at 11/14/20 2030 .  cloNIDine (CATAPRES) tablet 0.2 mg, 0.2 mg, Per Tube, TID, Jacky Kindle, MD, 0.2 mg at 11/14/20 2155 .  docusate (COLACE) 50 MG/5ML liquid 100 mg, 100 mg, Per Tube, BID, Lafayette Dragon, MD, 100 mg at 11/13/20 0943 .  docusate (COLACE) 50 MG/5ML liquid 100 mg, 100 mg, Per Tube, BID PRN, Jacky Kindle, MD .  feeding supplement (PROSource TF) liquid 45 mL, 45 mL, Per Tube, BID, Jacky Kindle, MD, 45 mL at 11/14/20 2155 .  feeding supplement (VITAL 1.5 CAL) liquid 1,000 mL, 1,000 mL, Per Tube, Continuous, Jacky Kindle, MD, Stopped at 11/15/20 0342 .  fentaNYL (SUBLIMAZE) injection 50-100 mcg, 50-100 mcg, Intravenous, Q2H PRN, Frederik Pear, MD, 100 mcg at 11/15/20 0441 .  folic acid (FOLVITE) tablet 1 mg, 1 mg, Per Tube, Daily, Noemi Chapel P, DO, 1 mg at 11/14/20 1229 .  hydrALAZINE (APRESOLINE) injection 10-20 mg, 10-20 mg, Intravenous, Q4H PRN, Frederik Pear, MD, 20 mg at 11/14/20 2326 .  insulin aspart (novoLOG) injection 0-6 Units, 0-6 Units, Subcutaneous, Q4H, Lafayette Dragon, MD, 1 Units at 11/15/20 0340 .  ipratropium-albuterol (DUONEB) 0.5-2.5 (3) MG/3ML nebulizer solution 3 mL, 3 mL, Nebulization, Q6H PRN, Lafayette Dragon, MD .  lactated ringers infusion, , Intravenous, Continuous, Chand, Currie Paris, MD, Last Rate: 250 mL/hr at 11/15/20 0700, Infusion  Verify at 11/15/20 0700 .  MEDLINE mouth rinse, 15 mL, Mouth Rinse, 10 times per day, Stretch, Marily Lente, MD, 15 mL at 11/15/20 0631 .  midazolam (VERSED) injection 1-2 mg, 1-2 mg, Intravenous, Q2H PRN, Frederik Pear, MD, 2 mg at 11/15/20 0505 .  multivitamin with minerals tablet 1 tablet, 1 tablet, Per Tube, Daily, Chand, Sudham, MD, 1 tablet at 11/14/20 1229 .   nicardipine (CARDENE) 33m in 0.86% saline 206mIV infusion (0.1 mg/ml), 0-15 mg/hr, Intravenous, Continuous, Ogan, OkKerry KassMD, Stopped at 11/15/20 0601 .  pantoprazole sodium (PROTONIX) 40 mg/20 mL oral suspension 40 mg, 40 mg, Per Tube, Daily, ChJacky KindleMD, 40 mg at 11/14/20 1229 .  polyethylene glycol (MIRALAX / GLYCOLAX) packet 17 g, 17 g, Per Tube, Daily, RiLafayette DragonMD, 17 g at 11/13/20 0942 .  polyethylene glycol (MIRALAX / GLYCOLAX) packet 17 g, 17 g, Per Tube, Daily PRN, ChTacy LearnSudham, MD .  polyvinyl alcohol (LIQUIFILM TEARS) 1.4 % ophthalmic solution 1 drop, 1 drop, Both Eyes, TID, RiLafayette DragonMD, 1 drop at 11/14/20 2143 .  propofol (DIPRIVAN) 1000 MG/100ML infusion, 5-80 mcg/kg/min, Intravenous, Titrated, Ogan, Okoronkwo U, MD, Last Rate: 38.5 mL/hr at 11/15/20 0700, 65 mcg/kg/min at 11/15/20 0700 .  sodium chloride flush (NS) 0.9 % injection 10-40 mL, 10-40 mL, Intracatheter, Q12H, Chand, SuCurrie ParisMD, 20 mL at 11/14/20 2154 .  sodium chloride flush (NS) 0.9 % injection 10-40 mL, 10-40 mL, Intracatheter, PRN, ChJacky KindleMD .  thiamine tablet 100 mg, 100 mg, Per Tube, Daily, ClNoemi Chapel, DO, 100 mg at 11/14/20 1229 .  vancomycin (VANCOREADY) IVPB 1000 mg/200 mL, 1,000 mg, Intravenous, Q24H, von DoCaren GriffinsRPH, Stopped at 11/14/20 1013 Labs CBC    Component Value Date/Time   WBC 7.1 11/14/2020 0532   RBC 3.38 (L) 11/14/2020 0532   HGB 10.3 (L) 11/14/2020 0532   HCT 31.7 (L) 11/14/2020 0532   PLT 163 11/14/2020 0532   MCV 93.8 11/14/2020 0532   MCH 30.5 11/14/2020 0532   MCHC 32.5 11/14/2020 0532   RDW 12.6 11/14/2020 0532   LYMPHSABS 1.2 11/10/2020 1551   MONOABS 1.9 (H) 11/10/2020 1551   EOSABS 0.0 11/10/2020 1551   BASOSABS 0.0 11/10/2020 1551    CMP     Component Value Date/Time   NA 140 11/15/2020 0535   K 3.5 11/15/2020 0535   CL 108 11/15/2020 0535   CO2 25 11/15/2020 0535   GLUCOSE 129 (H) 11/15/2020 0535   BUN 44 (H)  11/15/2020 0535   CREATININE 3.33 (H) 11/15/2020 0535   CALCIUM 7.6 (L) 11/15/2020 0535   PROT 4.7 (L) 11/13/2020 0700   ALBUMIN 2.1 (L) 11/13/2020 0700   AST 252 (H) 11/13/2020 0700   ALT 146 (H) 11/13/2020 0700   ALKPHOS 47 11/13/2020 0700   BILITOT 0.5 11/13/2020 0700   GFRNONAA 25 (L) 11/15/2020 0535  UDS +opiates, +amphetamines, +THC Blood cultures unremarkable thus far  Creatinine 3.33 yesterday-today's labs pending. CK down to 8856 Troponins 1848--> 2806--> 6533.  On 11/11/2020.  Imaging I have reviewed images in epic and the results pertinent to this consultation are: CT-scan of the brain IMPRESSION: Normal appearing brain parenchyma. No mass, hemorrhage, or extra-axial fluid collection. Small right frontal scalp hematoma with underlying bony calvarium intact. Paranasal sinus disease at several sites noted.  Agree with rads report on personal review.  MRI brain completed yesterday with subtle confluent areas of mildly restricted diffusion predominantly in the bilateral perirolandic  area-differentials include hypoxic ischemic injury, uremic and other toxic metabolic encephalopathies.  Minimal to none T2/flair correlate  2D echocardiogram unremarkable  Assessment:  30 year old man, past history of IV drug use presenting for unresponsiveness in the setting of amphetamine use.  Neurological examination was notable for obtundation despite being off of sedation and has intact brainstem reflexes which he continues to maintain. CT head with no acute abnormality-although MRI with subtle confluent areas of mildly restricted diffusion in the bilateral perirolandic area indicating possible hypoxic ischemic damage. Noted to have generalized tonic-clonic shaking in the ED for which she was hooked up to continuous EEG after loading with Keppra. No seizures on the EEG Differentials:- Toxic metabolic encephalopathy in the setting of substance use and hypoxic ischemic  encephalopathy.  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  Management of withdrawal per primary team as you are.  Empiric meningitic and encephalitic coverage  Possible IR guided LP with cell count, glucose, protein, HSV PCR, gram stain.  No need for AEDs  Check stat chest x-ray-concern for pneumonia with increasing oxygen requirements overnight   Discussed my plan with Kennieth Rad, NP-PCCM  Will follow.  -- Amie Portland, MD Neurologist [facility] Pager: [phone]   CRITICAL CARE ATTESTATION Performed by: Amie Portland, MD Total critical care time: 33 minutes Critical care time was exclusive of separately billable procedures and treating other patients and/or supervising APPs/Residents/Students Critical care was necessary to treat or prevent imminent or life-threatening deterioration due to toxic metabolic encephalopathy, hypoxic ischemic encephalopathy, This patient is critically ill and at significant risk for neurological worsening and/or death and care requires constant monitoring. Critical care was time spent personally by me on the following activities: development of treatment plan with patient and/or surrogate as well as nursing, discussions with consultants, evaluation of patient's response to treatment, examination of patient, obtaining history from patient or surrogate, ordering and performing treatments and interventions, ordering and review of laboratory studies, ordering and review of radiographic studies, pulse oximetry, re-evaluation of patient's condition, participation in multidisciplinary rounds and medical decision making of high  complexity in the care of this patient.

## 2020-11-15 NOTE — Progress Notes (Signed)
Patient restless and uncomfortable on vent. S/w Elink MD Dr Warrick Parisian and intermittent fentanyl ordered.

## 2020-11-15 NOTE — Progress Notes (Signed)
Nutrition Follow-up  DOCUMENTATION CODES:   Not applicable  INTERVENTION:   Tube feeding via OG tube: Vital 1.5 at 65 ml/h (1560 ml per day) Prosource TF 45 ml BID  Provides 2420 kcal, 127 gm protein, 1185 ml free water daily  MVI with minerals per tube daily   NUTRITION DIAGNOSIS:   Inadequate oral intake related to inability to eat as evidenced by NPO status. Ongoing.   GOAL:   Patient will meet greater than or equal to 90% of their needs Met with TF.   MONITOR:   TF tolerance  REASON FOR ASSESSMENT:   Consult Enteral/tube feeding initiation and management  ASSESSMENT:   Pt with PMH of polysubstance abuse recently released from prison admitted after being found down admitted with acute metabolic encephalopathy and AKI due to rhabdomyolysis.   Pt discussed during ICU rounds and with RN.  Per MD acute encephalopathy due to drug overdose vs herpes vs meningitis. Pt with increased agitation. Per neurology plan to wean sedation.   Patient is currently intubated on ventilator support MV: 8 L/min Temp (24hrs), Avg:99.9 F (37.7 C), Min:98 F (36.7 C), Max:101.2 F (38.4 C)  Propofol @ 38 ml/hr and provides: 1003 kcal  Medications reviewed and include: colace, folic acid, SSI, MVI, protonix, miralax, thiamine  Fentanyl  LR @ 75 ml/hr  Labs reviewed: BUN 44, Cr 3.33, K+/PO4/Mg WNL CK: 15,401 -> 7892 CBG's: 107-156  OG tube; tip below diaphragm per xray       Diet Order:   Diet Order            Diet NPO time specified  Diet effective now                 EDUCATION NEEDS:   No education needs have been identified at this time  Skin:  Skin Assessment: Reviewed RN Assessment (HSV blisters on multiple area of the body)  Last BM:  unknown  Height:   Ht Readings from Last 1 Encounters:  11/10/20 5' 10" (1.778 m)    Weight:   Wt Readings from Last 1 Encounters:  11/15/20 105.1 kg    Ideal Body Weight:     BMI:  Body mass index is 33.25  kg/m.  Estimated Nutritional Needs:   Kcal:  2300  Protein:  120-135 grams  Fluid:  >2.3 L/day  Lockie Pares., RD, LDN, CNSC See AMiON for contact information

## 2020-11-15 NOTE — Progress Notes (Addendum)
NAME:  Nathaniel Lynn, MRN:  [id number], DOB:  Sep 24, 1990, LOS: 5 ADMISSION DATE:  11/10/2020, CONSULTATION DATE:  11/11/20 REFERRING MD:  Valeria Batman, CHIEF COMPLAINT:   hypoxia  History of Present Illness:  [name] is a 30 year old young man with PMH significant for narcotic drug abuse.  The patient's mother reported that the patient had been released from prison 2 weeks ago after serving a 2 year sentence.  Since his release, he has been involved in drug abuse with his girlfriend.    The patient's mother reported that her daughter called her around 50 AM reporting that she was unable to reach her brother as he was not answering his phone.  She told her mother that the patient's probation officer called her to report that the patient's ankle monitoring device had sounded an alarm and the probation officer was unable to reach her brother. The daughter tried several times and when she could not reach her brother she called her mother at 79 AM who was already at work.   The sister was finally able to get another family member to go to the patient's girlfriend's house in search of her brother.  When she arrived around 3 pm, she found her brother unresponsive.   The girlfriend confirmed that they were getting "high". The girlfriend reported that she had given the patient narcan but she was still unable to arouse him.  Hence at that time 911 was called and the patient was taken to [facility] for evaluation. It is reported that the initial blood sugar was 140 in the field. [facility] nurse also documented that patient initially localized to painful stimuli.  The patient's toxicology screen came back positive for Amphetamines, opiates and cannabinoid. He was found to have acute kidney injury and CK level was > 50 thousand.  CT head was negative for acute pathology and lactic acid was elevated.  While in the ED, the patient was given Ativan x 2 for tonic-clonic seizure like activity.  The patient was  intubated shortly after seizure like activity was noted.  He was also found to be " posturing" therefore the neurologist recommended transfer to [facility] for continuous EEG monitoring.  I have seen and examine the patient.  He is unable to contribute to this consultation.  Hence this consultation was done with the assistance of the patient's mother and review of [facility] records.  Pertinent  Medical History  Drug abuse  Significant Hospital Events: Including procedures, antibiotic start and stop dates in addition to other pertinent events   . 5/14 admitted, intubated, transferred to [facility]. Started on empiric abx- vanc, cefepime, flagyl . 5/16 change cefepime to ceftriaxone, unable to get CSF from lumbar puncture, developed intermittent hypertension (SBP went up to 208) . 5/19- lumbar puncture was uneventful   Interim History / Subjective:  The patient had diarrhea last night, likely due to opioid withdrawal.  He got agitated, had tachycardia at 110s and tachypnea at 30s. He had 2 episodes of desaturation, at ~70% and ~80% respectively. Precedex, versed and fentanyl were given but remained ineffective. Given propofol.  Unable to get it due to patient's status.  Objective   Blood pressure 128/65, pulse 90, temperature (!) 101.2 F (38.4 C), temperature source Axillary, resp. rate (!) 27, weight 105.1 kg, SpO2 93 %.    Vent Mode: PRVC FiO2 (%):  [30 %-100 %] 100 % Set Rate:  [16 bmp] 16 bmp Vt Set:  [580 mL] 580 mL PEEP:  [5  cmH20] 5 cmH20 Pressure Support:  [8 cmH20] 8 cmH20 Plateau Pressure:  [20 cmH20-23 cmH20] 20 cmH20   Intake/Output Summary (Last 24 hours) at 11/15/2020 0941 Last data filed at 11/15/2020 0800 Gross per 24 hour  Intake 8910.38 ml  Output 1105 ml  Net 7805.38 ml   Filed Weights   11/13/20 0500 11/14/20 0500 11/15/20 0500  Weight: 94.7 kg 98.6 kg 105.1 kg    Examination: General: critically ill appearing man lying in bed in NAD, intubated, sedated HEENT: on ETT and  NG tube, normal pupillary reflex Lungs: bilateral vesicular breath sound Cardiovascular: S1S2, RRR Abdomen: soft, nondistended and non tender Extremities: healed skin popping scars on left thigh, ankle tracking device R ankle with mild area of erythema and edema just distal to it on his shin. Tattoos.  Neuro: RASS -5 Skin: healed pustular lesion under the right corner of mouth and left jaw, on umbilicus  Labs/imaging that I havepersonally reviewed  (right click and "Reselect all SmartList Selections" daily)  Creat improved 3.46>3.33 BUN 42>44 Blood culture (1 site) negative CXR- bilateral pleural effusion and bilateral infiltrate over mid to lower zones of lung bilaterally, suggestive of pulmonary edema or pneumonia Upper extremity venous duplex U/S showed left-sided acute superficial vein thrombosis involving the left cephalic vein   Resolved Hospital Problem list   Lactic acidosis Electrolyte imbalance Assessment & Plan:  Acute encephalopathy due to drug overdose -appreciate neurology consult -lumbar puncture was uneventful -CSF analysis & PCR pending   Agitation/ hypertension, likely due to drug withdrawal -likely due to drug withdrawal -increased clonidine to 0.3mg   -PRN hydralazine and nicardipine for blood pressure control  Pneumonia? - already on abx - treat accordingly after CSF analysis - trend CBC and PCT  meningitis/ encephalitis? -con't ceftriaxone, acyclovir & vancomycin -CSF analysis, PCR and culture pending  Acute hypoxic respiratory failure  -increased FiO2 to 100%> 70%  when he had desaturation last night -CXR 5/19 showed new-onset pleural effusion and bilateral interstitial infiltrate over mid to lower zones of lungs; suggestive of pulmonary edema/ pneumonia -lung-protective ventilation -VAP prevention protocol -PAD protocol for sedation; goal 0 to -1 -daily SAT & SBT  AKI due to rhabdomyolysis -stopped IV lactated ringers for suspected pulmonary  edema/ pneumonia -trend creat and BUN  -trend I/Os -renally dose meds -avoid nephrotoxic meds  Trop elevation, likely due to rhabdo -echo pending -tele monitoring -con't aspirin; no statin due to rhabdo  Elevated transaminases - likely secondary to rhabdo  IVDU -HIV was nonreactive -echocardiogram rules out endocarditis -will counsel to avoid this when stable  Low albumin - albumin was low at 2.1 - appreciate dietician consult   Best practice (right click and "Reselect all SmartList Selections" daily)  Diet:  Tube Feed  Pain/Anxiety/Delirium protocol (if indicated): Yes (RASS goal -1) VAP protocol (if indicated): Yes DVT prophylaxis: LMWH GI prophylaxis: PPI Glucose control:  SSI Yes Central venous access:  N/A Arterial line:  N/A Foley:  Yes, and it is still needed Mobility:  bed rest  PT consulted: N/A Last date of multidisciplinary goals of care discussion [ ]  Code Status:  full code Disposition: ICU   Labs   CBC: Recent Labs  Lab 11/10/20 1551 11/11/20 0131 11/14/20 0532  WBC 15.3* 9.7 7.1  NEUTROABS 12.1*  --   --   HGB 16.6 16.6 10.3*  HCT 48.1 48.3 31.7*  MCV 89.6 89.8 93.8  PLT 312 224 163    Basic Metabolic Panel: Recent Labs  Lab 11/11/20 0131 11/12/20 0132  11/13/20 0700 11/13/20 1424 11/14/20 0532 11/15/20 0535  NA 136 136 138 139 140 140  K 4.2 3.8 3.0* 3.6 3.5 3.5  CL 103 98 103 101 105 108  CO2 22 29 30 29 28 25   GLUCOSE 104* 127* 148* 137* 112* 129*  BUN 25* 31* 36* 39* 42* 44*  CREATININE 2.38* 3.33* 3.52* 3.65* 3.46* 3.33*  CALCIUM 8.3* 8.1* 7.2* 7.5* 7.5* 7.6*  MG 2.2 2.4 2.2  --  2.3 2.0  PHOS 4.8* 4.2 3.9  --  3.2 2.7   GFR: Estimated Creatinine Clearance: 40.1 mL/min (A) (by C-G formula based on SCr of 3.33 mg/dL (H)). Recent Labs  Lab 11/10/20 1551 11/10/20 1800 11/10/20 1952 11/11/20 0131 11/11/20 0436 11/14/20 0532  WBC 15.3*  --   --  9.7  --  7.1  LATICACIDVEN  --  3.0* 3.0* 2.0* 1.5  --     Liver  Function Tests: Recent Labs  Lab 11/10/20 1551 11/11/20 0131 11/13/20 0700  AST 669* 672* 252*  ALT 265* 280* 146*  ALKPHOS 93 75 47  BILITOT 1.1 1.1 0.5  PROT 8.8* 6.7 4.7*  ALBUMIN 4.8 3.5 2.1*   Recent Labs  Lab 11/11/20 0131  LIPASE 25   No results for input(s): AMMONIA in the last 168 hours.  ABG    Component Value Date/Time   PHART 7.40 11/10/2020 1720   PCO2ART 34 11/10/2020 1720   PO2ART 178 (H) 11/10/2020 1720   HCO3 21.1 11/10/2020 1720   O2SAT 99.6 11/10/2020 1720     Coagulation Profile: No results for input(s): INR, PROTIME in the last 168 hours.  Cardiac Enzymes: Recent Labs  Lab 11/11/20 0131 11/12/20 0739 11/13/20 0700 11/14/20 0532 11/15/20 0535  CKTOTAL 47,139* 15,401* 11,430* 8,856* 7,892*    HbA1C: Hgb A1c MFr Bld  Date/Time Value Ref Range Status  11/11/2020 01:31 AM 5.1 4.8 - 5.6 % Final    Comment:    (NOTE) Pre diabetes:          5.7%-6.4%  Diabetes:              >6.4%  Glycemic control for   <7.0% adults with diabetes     CBG: Recent Labs  Lab 11/14/20 1540 11/14/20 1937 11/14/20 2331 11/15/20 0321 11/15/20 0754  GLUCAP 137* 149* 152* 156* 107*

## 2020-11-16 ENCOUNTER — Inpatient Hospital Stay (HOSPITAL_COMMUNITY): Payer: Self-pay

## 2020-11-16 LAB — BASIC METABOLIC PANEL
Anion gap: 7 (ref 5–15)
BUN: 55 mg/dL — ABNORMAL HIGH (ref 6–20)
CO2: 27 mmol/L (ref 22–32)
Calcium: 7.9 mg/dL — ABNORMAL LOW (ref 8.9–10.3)
Chloride: 105 mmol/L (ref 98–111)
Creatinine, Ser: 3.37 mg/dL — ABNORMAL HIGH (ref 0.61–1.24)
GFR, Estimated: 24 mL/min — ABNORMAL LOW (ref >60)
Glucose, Bld: 134 mg/dL — ABNORMAL HIGH (ref 70–99)
Potassium: 3.8 mmol/L (ref 3.5–5.1)
Sodium: 139 mmol/L (ref 135–145)

## 2020-11-16 LAB — GLUCOSE, CAPILLARY
Glucose-Capillary: 143 mg/dL — ABNORMAL HIGH (ref 70–99)
Glucose-Capillary: 130 mg/dL — ABNORMAL HIGH (ref 70–99)
Glucose-Capillary: 141 mg/dL — ABNORMAL HIGH (ref 70–99)
Glucose-Capillary: 127 mg/dL — ABNORMAL HIGH (ref 70–99)
Glucose-Capillary: 126 mg/dL — ABNORMAL HIGH (ref 70–99)
Glucose-Capillary: 127 mg/dL — ABNORMAL HIGH (ref 70–99)

## 2020-11-16 LAB — VDRL, CSF: VDRL Quant, CSF: NONREACTIVE

## 2020-11-16 LAB — TRIGLYCERIDES: Triglycerides: 67 mg/dL (ref <150)

## 2020-11-16 LAB — MAGNESIUM: Magnesium: 2.3 mg/dL (ref 1.7–2.4)

## 2020-11-16 LAB — PHOSPHORUS: Phosphorus: 3.8 mg/dL (ref 2.5–4.6)

## 2020-11-16 MED ORDER — ENOXAPARIN SODIUM 40 MG/0.4ML IJ SOSY
40.0000 mg | PREFILLED_SYRINGE | INTRAMUSCULAR | Status: DC
  Administered 2020-11-16 – 2020-11-21 (×6): 40 mg via SUBCUTANEOUS
  Filled 2020-11-16 (×6): qty 0.4

## 2020-11-16 MED ORDER — LORAZEPAM 1 MG PO TABS
1.0000 mg | ORAL_TABLET | Freq: Four times a day (QID) | ORAL | Status: DC
  Administered 2020-11-16 – 2020-11-17 (×4): 1 mg
  Filled 2020-11-16 (×4): qty 1

## 2020-11-16 NOTE — Progress Notes (Signed)
LTM maint complete - reapplied O1 [facility] monitored, Event button test confirmed by [facility].

## 2020-11-16 NOTE — Progress Notes (Signed)
LTM EEG hooked up and running - no initial skin breakdown - push button tested - neuro notified.  

## 2020-11-16 NOTE — Progress Notes (Signed)
Neurology Progress Note   S://  Seen and examined. Still encephalopathic and requiring sedation due to agitation   O:// Current vital signs: BP (!) 146/79   Pulse 90   Temp 99.5 F (37.5 C) (Axillary)   Resp 20   Wt 111.6 kg   SpO2 96%   BMI 35.30 kg/m  Vital signs in last 24 hours: Temp:  [98.9 F (37.2 C)-101.3 F (38.5 C)] 99.5 F (37.5 C) (05/20 0800) Pulse Rate:  [83-130] 90 (05/20 1100) Resp:  [18-35] 20 (05/20 1100) BP: (131-171)/(66-91) 146/79 (05/20 1100) SpO2:  [90 %-100 %] 96 % (05/20 1114) FiO2 (%):  [50 %-100 %] 50 % (05/20 1114) Weight:  [111.6 kg] 111.6 kg (05/20 0400) General: Intubated, sedated on propofol-held for sedation HEENT: Normocephalic, atraumatic, multiple rashes on the face. Lungs: Vented Cardiovascular: Regular rate rhythm Abdomen soft nondistended nontender Extremities: No edema, multiple rashes on arms and legs Neurologic exam Mental status: Intubated, on sedation with propofol. Precedex was held for exam. No spontaneous movement No response to voice. Cranials: Pupils 2 mm sluggish bilaterally, mildly disconjugate gaze, positive corneals, breathing over the ventilator, difficult to ascertain facial symmetry. Motor exam: Some withdrawal to noxious stimulation in lower extremities.  None on uppers. Sensory exam: As above Coordination cannot be assessed   Medications  Current Facility-Administered Medications:  .  0.9 %  sodium chloride infusion, , Intravenous, PRN, Jacky Kindle, MD, Last Rate: 10 mL/hr at 11/16/20 1101, 500 mL at 11/16/20 1101 .  acetaminophen (TYLENOL) tablet 650 mg, 650 mg, Per Tube, Q6H PRN, Jacky Kindle, MD, 650 mg at 11/16/20 0033 .  acyclovir (ZOVIRAX) 780 mg in dextrose 5 % 150 mL IVPB, 10 mg/kg, Intravenous, Q12H, von Dohlen, Stasia Cavalier, RPH, Stopped at 11/16/20 1008 .  chlorhexidine gluconate (MEDLINE KIT) (PERIDEX) 0.12 % solution 15 mL, 15 mL, Mouth Rinse, BID, Stretch, Marily Lente, MD, 15 mL at 11/16/20 0758 .   Chlorhexidine Gluconate Cloth 2 % PADS 6 each, 6 each, Topical, Daily, Julian Hy, DO, 6 each at 11/15/20 1950 .  cloNIDine (CATAPRES) tablet 0.3 mg, 0.3 mg, Per Tube, TID, Jacky Kindle, MD, 0.3 mg at 11/16/20 0902 .  docusate (COLACE) 50 MG/5ML liquid 100 mg, 100 mg, Per Tube, BID, Lafayette Dragon, MD, 100 mg at 11/13/20 0943 .  docusate (COLACE) 50 MG/5ML liquid 100 mg, 100 mg, Per Tube, BID PRN, Tacy Learn, Sudham, MD .  enoxaparin (LOVENOX) injection 40 mg, 40 mg, Subcutaneous, Q24H, Chand, Sudham, MD .  feeding supplement (PROSource TF) liquid 45 mL, 45 mL, Per Tube, BID, Jacky Kindle, MD, 45 mL at 11/16/20 0903 .  feeding supplement (VITAL 1.5 CAL) liquid 1,000 mL, 1,000 mL, Per Tube, Continuous, Chand, Sudham, MD, Last Rate: 65 mL/hr at 11/16/20 0859, 1,000 mL at 11/16/20 0859 .  fentaNYL (SUBLIMAZE) injection 50-100 mcg, 50-100 mcg, Intravenous, Q2H PRN, Frederik Pear, MD, 100 mcg at 11/16/20 0836 .  folic acid (FOLVITE) tablet 1 mg, 1 mg, Per Tube, Daily, Julian Hy, DO, 1 mg at 11/16/20 1683 .  hydrALAZINE (APRESOLINE) injection 10-20 mg, 10-20 mg, Intravenous, Q4H PRN, Frederik Pear, MD, 10 mg at 11/16/20 0803 .  insulin aspart (novoLOG) injection 0-6 Units, 0-6 Units, Subcutaneous, Q4H, Lafayette Dragon, MD, 1 Units at 11/15/20 0340 .  ipratropium-albuterol (DUONEB) 0.5-2.5 (3) MG/3ML nebulizer solution 3 mL, 3 mL, Nebulization, Q6H PRN, Lafayette Dragon, MD .  lactated ringers infusion, , Intravenous, Continuous, Jacky Kindle, MD, Last Rate: 75 mL/hr at  11/16/20 1100, Infusion Verify at 11/16/20 1100 .  LORazepam (ATIVAN) tablet 1 mg, 1 mg, Per Tube, Q6H, Amie Portland, MD .  MEDLINE mouth rinse, 15 mL, Mouth Rinse, 10 times per day, Stretch, Marily Lente, MD, 15 mL at 11/16/20 0910 .  multivitamin with minerals tablet 1 tablet, 1 tablet, Per Tube, Daily, Jacky Kindle, MD, 1 tablet at 11/16/20 0903 .  pantoprazole sodium (PROTONIX) 40 mg/20 mL oral suspension 40 mg, 40  mg, Per Tube, Daily, Jacky Kindle, MD, 40 mg at 11/16/20 0903 .  polyethylene glycol (MIRALAX / GLYCOLAX) packet 17 g, 17 g, Per Tube, Daily, Lafayette Dragon, MD, 17 g at 11/13/20 0942 .  polyethylene glycol (MIRALAX / GLYCOLAX) packet 17 g, 17 g, Per Tube, Daily PRN, Tacy Learn, Sudham, MD .  polyvinyl alcohol (LIQUIFILM TEARS) 1.4 % ophthalmic solution 1 drop, 1 drop, Both Eyes, TID, Lafayette Dragon, MD, 1 drop at 11/16/20 [phone] .  propofol (DIPRIVAN) 1000 MG/100ML infusion, 5-80 mcg/kg/min, Intravenous, Titrated, Ogan, Okoronkwo U, MD, Last Rate: 11.83 mL/hr at 11/16/20 1101, 20 mcg/kg/min at 11/16/20 1101 .  QUEtiapine (SEROQUEL) tablet 50 mg, 50 mg, Per Tube, BID, Jacky Kindle, MD, 50 mg at 11/16/20 0902 .  sodium chloride flush (NS) 0.9 % injection 10-40 mL, 10-40 mL, Intracatheter, Q12H, Chand, Sudham, MD, 10 mL at 11/16/20 0910 .  sodium chloride flush (NS) 0.9 % injection 10-40 mL, 10-40 mL, Intracatheter, PRN, Jacky Kindle, MD .  thiamine tablet 100 mg, 100 mg, Per Tube, Daily, Julian Hy, DO, 100 mg at 11/16/20 0902 Labs CBC    Component Value Date/Time   WBC 13.7 (H) 11/15/2020 1328   RBC 3.09 (L) 11/15/2020 1328   HGB 9.7 (L) 11/15/2020 1328   HCT 29.9 (L) 11/15/2020 1328   PLT 164 11/15/2020 1328   MCV 96.8 11/15/2020 1328   MCH 31.4 11/15/2020 1328   MCHC 32.4 11/15/2020 1328   RDW 12.8 11/15/2020 1328   LYMPHSABS 1.2 11/10/2020 1551   MONOABS 1.9 (H) 11/10/2020 1551   EOSABS 0.0 11/10/2020 1551   BASOSABS 0.0 11/10/2020 1551    CMP     Component Value Date/Time   NA 139 11/16/2020 0556   K 3.8 11/16/2020 0556   CL 105 11/16/2020 0556   CO2 27 11/16/2020 0556   GLUCOSE 134 (H) 11/16/2020 0556   BUN 55 (H) 11/16/2020 0556   CREATININE 3.37 (H) 11/16/2020 0556   CALCIUM 7.9 (L) 11/16/2020 0556   PROT 4.7 (L) 11/13/2020 0700   ALBUMIN 2.1 (L) 11/13/2020 0700   AST 252 (H) 11/13/2020 0700   ALT 146 (H) 11/13/2020 0700   ALKPHOS 47 11/13/2020 0700   BILITOT  0.5 11/13/2020 0700   GFRNONAA 24 (L) 11/16/2020 0556  UDS +opiates, +amphetamines, +THC Blood cultures unremarkable thus far  Creatinine 3.33 yesterday-today's labs pending. CK down to 8856 Troponins 1848--> 2806--> 6533.  On 11/11/2020.  Imaging I have reviewed images in epic and the results pertinent to this consultation are: CT-scan of the brain IMPRESSION: Normal appearing brain parenchyma. No mass, hemorrhage, or extra-axial fluid collection. Small right frontal scalp hematoma with underlying bony calvarium intact. Paranasal sinus disease at several sites noted.  Agree with rads report on personal review.  MRI brain completed yesterday with subtle confluent areas of mildly restricted diffusion predominantly in the bilateral perirolandic area-differentials include hypoxic ischemic injury, uremic and other toxic metabolic encephalopathies.  Minimal to none T2/flair correlate  2D echocardiogram unremarkable  Assessment:  30 year old man, past history  of IV drug use presenting for unresponsiveness in the setting of amphetamine use.  Neurological examination was notable for obtundation despite being off of sedation and has intact brainstem reflexes which he continues to maintain. CT head with no acute abnormality-although MRI with subtle confluent areas of mildly restricted diffusion in the bilateral perirolandic area indicating possible hypoxic ischemic damage. Noted to have generalized tonic-clonic shaking in the ED for which she was hooked up to continuous EEG after loading with Keppra. No seizures on the EEG Differentials:- Toxic metabolic encephalopathy in the setting of substance use and hypoxic ischemic encephalopathy.  Continuous EEG negative for seizures LP unsuccessful at bedside x2. Appreciate radiology assistance-for IR guided spinal tap CSF with glucose 75, protein 15, RBC 36, WBC 4-not impressive for bacterial or viral meningitis but has been treated now for a few  days.   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  Management of withdrawal per primary team as you are.  Empiric meningitic and encephalitic coverage can be discontinued  We will look him up on LTM again because he exam is not improving.  Might consider repeating MRI tomorrow.  Discussed my plan with Dr. Tacy Learn  Will follow.  -- Amie Portland, MD Neurologist [facility] Pager: [phone]   CRITICAL CARE ATTESTATION Performed by: Amie Portland, MD Total critical care time: 30 minutes Critical care time was exclusive of separately billable procedures and treating other patients and/or supervising APPs/Residents/Students Critical care was necessary to treat or prevent imminent or life-threatening deterioration due to toxic metabolic encephalopathy, hypoxic ischemic encephalopathy, This patient is critically ill and at significant risk for neurological worsening and/or death and care requires constant monitoring. Critical care was time spent personally by me on the following activities: development of treatment plan with patient and/or surrogate as well as nursing, discussions with consultants, evaluation of patient's response to treatment, examination of patient, obtaining history from patient or surrogate, ordering and performing treatments and interventions, ordering and review of laboratory studies, ordering and review of radiographic studies, pulse oximetry, re-evaluation of patient's condition, participation in multidisciplinary rounds and medical decision making of high complexity in the care of this patient.

## 2020-11-16 NOTE — Progress Notes (Addendum)
NAME:  Nathaniel Lynn, MRN:  [id number], DOB:  Mar 24, 1991, LOS: 6 ADMISSION DATE:  11/10/2020, CONSULTATION DATE:  11/11/20 REFERRING MD:  Valeria Batman, CHIEF COMPLAINT:   hypoxia  History of Present Illness:  [name]. Doris is a 30 year old young man with PMH significant for narcotic drug abuse.  The patient's mother reported that the patient had been released from prison 2 weeks ago after serving a 2 year sentence.  Since his release, he has been involved in drug abuse with his girlfriend.    The patient's mother reported that her daughter called her around 89 AM reporting that she was unable to reach her brother as he was not answering his phone.  She told her mother that the patient's probation officer called her to report that the patient's ankle monitoring device had sounded an alarm and the probation officer was unable to reach her brother. The daughter tried several times and when she could not reach her brother she called her mother at 26 AM who was already at work.   The sister was finally able to get another family member to go to the patient's girlfriend's house in search of her brother.  When she arrived around 3 pm, she found her brother unresponsive.   The girlfriend confirmed that they were getting "high". The girlfriend reported that she had given the patient narcan but she was still unable to arouse him.  Hence at that time 911 was called and the patient was taken to [facility] for evaluation. It is reported that the initial blood sugar was 140 in the field. [facility] nurse also documented that patient initially localized to painful stimuli.  The patient's toxicology screen came back positive for Amphetamines, opiates and cannabinoid. He was found to have acute kidney injury and CK level was > 50 thousand.  CT head was negative for acute pathology and lactic acid was elevated.  While in the ED, the patient was given Ativan x 2 for tonic-clonic seizure like activity.  The patient was  intubated shortly after seizure like activity was noted.  He was also found to be " posturing" therefore the neurologist recommended transfer to [facility] for continuous EEG monitoring.  I have seen and examine the patient.  He is unable to contribute to this consultation.  Hence this consultation was done with the assistance of the patient's mother and review of [facility] records.  Pertinent  Medical History  Drug abuse  Significant Hospital Events: Including procedures, antibiotic start and stop dates in addition to other pertinent events   . 5/14 admitted, intubated, transferred to [facility]. Started on empiric abx- vanc, cefepime, flagyl . 5/16 change cefepime to ceftriaxone, unable to get CSF from lumbar puncture, developed intermittent hypertension (SBP went up to 208) . 5/19- lumbar puncture was uneventful   Interim History / Subjective:  The patient got agitated last night and was given fentanyl.  Unable to get it due to patient's status.  Objective   Blood pressure (!) 157/79, pulse 84, temperature 99.5 F (37.5 C), temperature source Axillary, resp. rate 18, weight 111.6 kg, SpO2 100 %.    Vent Mode: PRVC FiO2 (%):  [60 %-100 %] 100 % Set Rate:  [16 bmp] 16 bmp Vt Set:  [580 mL] 580 mL PEEP:  [10 cmH20] 10 cmH20 Plateau Pressure:  [20 cmH20-24 cmH20] 22 cmH20   Intake/Output Summary (Last 24 hours) at 11/16/2020 1049 Last data filed at 11/16/2020 0900 Gross per 24 hour  Intake 3780.85 ml  Output 2175 ml  Net 1605.85 ml   Filed Weights   11/14/20 0500 11/15/20 0500 11/16/20 0400  Weight: 98.6 kg 105.1 kg 111.6 kg    Examination: General: critically ill appearing man lying in bed in NAD, intubated HEENT: on ETT and NG tube Lungs: bilateral vesicular breath sound Cardiovascular: S1S2, RRR Abdomen: soft, nondistended and non tender Extremities: healed skin popping scars on left thigh, slight pedal edema bilaterally, tattoos.  Neuro: RASS -5  Labs/imaging that I havepersonally  reviewed  (right click and "Reselect all SmartList Selections" daily)  Creat trends up 3.33>3.37 BUN 44>55 PCT 3.65 CSF- low glucose and RBC 36 Blood culture (1 site) negative Hb 10.3-> 9.7 WBC 13.7 Urinalysis- negative nitrite and leukocytes, hazy urine and small Hgb urine dipstick   Resolved Hospital Problem list   Lactic acidosis Electrolyte imbalance Assessment & Plan:  Acute encephalopathy due to drug overdose -for overnight EEG today to r/o seizure -for MRI on 5/21  Agitation/ hypertension, likely due to drug withdrawal -on clonidine to 0.3mg   -on low-dose ativan -PRN hydralazine and nicardipine for blood pressure control  Leukocytosis -WBC 13.7, PCT 3.65 -trend CBC and PCT -afebrile  meningitis/ encephalitis? -stop ceftriaxone & vancomycin -HSV 1/2 PCR is pending until 5/23 -consider holding acyclovir  Acute hypoxic respiratory failure  -FiO2 50%  when he had desaturation last night -lung-protective ventilation -VAP prevention protocol -PAD protocol for sedation; goal 0 to -1 -daily SAT & SBT  AKI due to rhabdomyolysis -slow down IV infusion due to volume overload -trend creat and BUN  -trend I/Os -renally dose meds -avoid nephrotoxic meds  Anemia (normocytic, normochromic) - Hb trends down from 10.3> 9.7 - trend CBC  Trop elevation, likely due to rhabdo -echo pending -tele monitoring -con't aspirin; no statin due to rhabdo  Elevated transaminases - likely secondary to rhabdo  IVDU -HIV was nonreactive -echocardiogram rules out endocarditis -will counsel to avoid this when stable  Low albumin - albumin was low at 2.1 - appreciate dietician consult Best practice (right click and "Reselect all SmartList Selections" daily)  Diet:  Tube Feed  Pain/Anxiety/Delirium protocol (if indicated): Yes (RASS goal -1) VAP protocol (if indicated): Yes DVT prophylaxis: LMWH GI prophylaxis: PPI Glucose control:  SSI Yes Central venous access:   N/A Arterial line:  N/A Foley:  Yes, and it is still needed Mobility:  bed rest  PT consulted: N/A Last date of multidisciplinary goals of care discussion [ ]  Code Status:  full code Disposition: ICU   Labs   CBC: Recent Labs  Lab 11/10/20 1551 11/11/20 0131 11/14/20 0532 11/15/20 1328  WBC 15.3* 9.7 7.1 13.7*  NEUTROABS 12.1*  --   --   --   HGB 16.6 16.6 10.3* 9.7*  HCT 48.1 48.3 31.7* 29.9*  MCV 89.6 89.8 93.8 96.8  PLT 312 224 163 164    Basic Metabolic Panel: Recent Labs  Lab 11/12/20 0132 11/13/20 0700 11/13/20 1424 11/14/20 0532 11/15/20 0535 11/16/20 0556  NA 136 138 139 140 140 139  K 3.8 3.0* 3.6 3.5 3.5 3.8  CL 98 103 101 105 108 105  CO2 29 30 29 28 25 27   GLUCOSE 127* 148* 137* 112* 129* 134*  BUN 31* 36* 39* 42* 44* 55*  CREATININE 3.33* 3.52* 3.65* 3.46* 3.33* 3.37*  CALCIUM 8.1* 7.2* 7.5* 7.5* 7.6* 7.9*  MG 2.4 2.2  --  2.3 2.0 2.3  PHOS 4.2 3.9  --  3.2 2.7 3.8   GFR: Estimated Creatinine Clearance: 40.8  mL/min (A) (by C-G formula based on SCr of 3.37 mg/dL (H)). Recent Labs  Lab 11/10/20 1551 11/10/20 1800 11/10/20 1952 11/11/20 0131 11/11/20 0436 11/14/20 0532 11/15/20 1328  PROCALCITON  --   --   --   --   --   --  3.65  WBC 15.3*  --   --  9.7  --  7.1 13.7*  LATICACIDVEN  --  3.0* 3.0* 2.0* 1.5  --   --     Liver Function Tests: Recent Labs  Lab 11/10/20 1551 11/11/20 0131 11/13/20 0700  AST 669* 672* 252*  ALT 265* 280* 146*  ALKPHOS 93 75 47  BILITOT 1.1 1.1 0.5  PROT 8.8* 6.7 4.7*  ALBUMIN 4.8 3.5 2.1*   Recent Labs  Lab 11/11/20 0131  LIPASE 25   No results for input(s): AMMONIA in the last 168 hours.  ABG    Component Value Date/Time   PHART 7.40 11/10/2020 1720   PCO2ART 34 11/10/2020 1720   PO2ART 178 (H) 11/10/2020 1720   HCO3 21.1 11/10/2020 1720   O2SAT 99.6 11/10/2020 1720     Coagulation Profile: No results for input(s): INR, PROTIME in the last 168 hours.  Cardiac Enzymes: Recent Labs   Lab 11/11/20 0131 11/12/20 0739 11/13/20 0700 11/14/20 0532 11/15/20 0535  CKTOTAL 47,139* 15,401* 11,430* 8,856* 7,892*    HbA1C: Hgb A1c MFr Bld  Date/Time Value Ref Range Status  11/11/2020 01:31 AM 5.1 4.8 - 5.6 % Final    Comment:    (NOTE) Pre diabetes:          5.7%-6.4%  Diabetes:              >6.4%  Glycemic control for   <7.0% adults with diabetes     CBG: Recent Labs  Lab 11/15/20 1540 11/15/20 1941 11/15/20 2319 11/16/20 0322 11/16/20 0730  GLUCAP 114* 106* 122* 143* 130*

## 2020-11-17 ENCOUNTER — Inpatient Hospital Stay (HOSPITAL_COMMUNITY): Payer: Self-pay

## 2020-11-17 LAB — BASIC METABOLIC PANEL
Anion gap: 8 (ref 5–15)
BUN: 56 mg/dL — ABNORMAL HIGH (ref 6–20)
CO2: 26 mmol/L (ref 22–32)
Calcium: 7.5 mg/dL — ABNORMAL LOW (ref 8.9–10.3)
Chloride: 107 mmol/L (ref 98–111)
Creatinine, Ser: 3.06 mg/dL — ABNORMAL HIGH (ref 0.61–1.24)
GFR, Estimated: 27 mL/min — ABNORMAL LOW (ref >60)
Glucose, Bld: 131 mg/dL — ABNORMAL HIGH (ref 70–99)
Potassium: 3.8 mmol/L (ref 3.5–5.1)
Sodium: 141 mmol/L (ref 135–145)

## 2020-11-17 LAB — MAGNESIUM: Magnesium: 2.6 mg/dL — ABNORMAL HIGH (ref 1.7–2.4)

## 2020-11-17 LAB — GLUCOSE, CAPILLARY
Glucose-Capillary: 138 mg/dL — ABNORMAL HIGH (ref 70–99)
Glucose-Capillary: 136 mg/dL — ABNORMAL HIGH (ref 70–99)
Glucose-Capillary: 135 mg/dL — ABNORMAL HIGH (ref 70–99)
Glucose-Capillary: 134 mg/dL — ABNORMAL HIGH (ref 70–99)
Glucose-Capillary: 137 mg/dL — ABNORMAL HIGH (ref 70–99)
Glucose-Capillary: 138 mg/dL — ABNORMAL HIGH (ref 70–99)

## 2020-11-17 LAB — HSV 1/2 PCR, CSF
HSV-1 DNA: NEGATIVE
HSV-2 DNA: NEGATIVE

## 2020-11-17 LAB — PHOSPHORUS: Phosphorus: 4.9 mg/dL — ABNORMAL HIGH (ref 2.5–4.6)

## 2020-11-17 MED ORDER — LORAZEPAM 0.5 MG PO TABS
0.5000 mg | ORAL_TABLET | Freq: Four times a day (QID) | ORAL | Status: DC
  Administered 2020-11-17 – 2020-11-21 (×16): 0.5 mg
  Filled 2020-11-17 (×16): qty 1

## 2020-11-17 MED ORDER — DEXMEDETOMIDINE HCL IN NACL 400 MCG/100ML IV SOLN
0.4000 ug/kg/h | INTRAVENOUS | Status: AC
  Administered 2020-11-17: 2 ug/kg/h via INTRAVENOUS
  Administered 2020-11-17: 2.4 ug/kg/h via INTRAVENOUS
  Administered 2020-11-17 (×2): 1.6 ug/kg/h via INTRAVENOUS
  Administered 2020-11-17 (×2): 2.4 ug/kg/h via INTRAVENOUS
  Administered 2020-11-18: 1.3 ug/kg/h via INTRAVENOUS
  Administered 2020-11-18: 1.2 ug/kg/h via INTRAVENOUS
  Administered 2020-11-18: 1.3 ug/kg/h via INTRAVENOUS
  Administered 2020-11-18: 0.6 ug/kg/h via INTRAVENOUS
  Filled 2020-11-17: qty 300
  Filled 2020-11-17 (×2): qty 200
  Filled 2020-11-17 (×4): qty 100

## 2020-11-17 NOTE — Progress Notes (Signed)
Pt transported to CT and back to 4N21 without any complications.  

## 2020-11-17 NOTE — Progress Notes (Signed)
LTM EEG unhooked, No skin break seen.

## 2020-11-17 NOTE — Procedures (Signed)
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 EEG to evaluate for seizure  Level of alertness:comatose  AEDs during EEG study:Propofol  Technical aspects: This EEG study was done with scalp electrodes positioned according to the 10-20 International system of electrode placement. Electrical activity was acquired at a sampling rate of 500Hz  and reviewed with a high frequency filter of 70Hz  and a low frequency filter of 1Hz . EEG data were recorded continuously and digitally stored.   Description: EEG showed continuous generalized 3 to 6 Hz theta-delta slowing with overriding 15-18Hz  generalized beta activity.Hyperventilation and photic stimulation were not performed.   ABNORMALITY - Continuous slow, generalized  IMPRESSION: This study is suggestive ofsevere diffuse encephalopathy, nonspecific etiology but likely related to sedation.No seizures or epileptiform discharges were seen throughout the recording.  Naven Giambalvo 

## 2020-11-17 NOTE — Progress Notes (Signed)
NAME:  Nathaniel Lynn, MRN:  [id number], DOB:  07/30/1990, LOS: 7 ADMISSION DATE:  11/10/2020, CONSULTATION DATE:  11/11/20 REFERRING MD:  Valeria Batman, CHIEF COMPLAINT:   hypoxia  History of Present Illness:  [name] is a 30 year old young man with PMH significant for narcotic drug abuse.  The patient's mother reported that the patient had been released from prison 2 weeks ago after serving a 2 year sentence.  Since his release, he has been involved in drug abuse with his girlfriend.    The patient's mother reported that her daughter called her around 60 AM reporting that she was unable to reach her brother as he was not answering his phone.  She told her mother that the patient's probation officer called her to report that the patient's ankle monitoring device had sounded an alarm and the probation officer was unable to reach her brother. The daughter tried several times and when she could not reach her brother she called her mother at 66 AM who was already at work.   The sister was finally able to get another family member to go to the patient's girlfriend's house in search of her brother.  When she arrived around 3 pm, she found her brother unresponsive.   The girlfriend confirmed that they were getting "high". The girlfriend reported that she had given the patient narcan but she was still unable to arouse him.  Hence at that time 911 was called and the patient was taken to [facility] for evaluation. It is reported that the initial blood sugar was 140 in the field. [facility] nurse also documented that patient initially localized to painful stimuli.  The patient's toxicology screen came back positive for Amphetamines, opiates and cannabinoid. He was found to have acute kidney injury and CK level was > 50 thousand.  CT head was negative for acute pathology and lactic acid was elevated.  While in the ED, the patient was given Ativan x 2 for tonic-clonic seizure like activity.  The patient was  intubated shortly after seizure like activity was noted.  He was also found to be " posturing" therefore the neurologist recommended transfer to [facility] for continuous EEG monitoring.  I have seen and examine the patient.  He is unable to contribute to this consultation.  Hence this consultation was done with the assistance of the patient's mother and review of [facility] records.  Pertinent  Medical History  Drug abuse  Significant Hospital Events: Including procedures, antibiotic start and stop dates in addition to other pertinent events   . 5/14 admitted, intubated, transferred to [facility]. Started on empiric abx- vanc, cefepime, flagyl . 5/16 change cefepime to ceftriaxone, unable to get CSF from lumbar puncture, developed intermittent hypertension (SBP went up to 208) . 5/19- lumbar puncture was uneventful . 5/20-antibiotics discontinued following reassuring LP.  HSV pending   Interim History / Subjective:   No seizures noted on continuous EEG for the last several days including overnight last night I/O+ 26.6 L total Sedated on propofol 20, no purposeful movement reported overnight Serum creatinine remains elevated 3.06 (slightly improved) Antibiotics discontinued 5/20, remains on acyclovir   Objective   Blood pressure (!) 161/81, pulse 84, temperature 99.8 F (37.7 C), temperature source Axillary, resp. rate (!) 21, weight 114.9 kg, SpO2 98 %.    Vent Mode: PRVC FiO2 (%):  [50 %-60 %] 50 % Set Rate:  [16 bmp] 16 bmp Vt Set:  [580 mL] 580 mL PEEP:  [8 cmH20-10 cmH20]  8 cmH20 Plateau Pressure:  [23 cmH20-26 cmH20] 26 cmH20   Intake/Output Summary (Last 24 hours) at 11/17/2020 6606 Last data filed at 11/17/2020 3016 Gross per 24 hour  Intake 3975.12 ml  Output 1905 ml  Net 2070.12 ml   Filed Weights   11/15/20 0500 11/16/20 0400 11/17/20 0400  Weight: 105.1 kg 111.6 kg 114.9 kg    Examination: General: Well-developed critically ill gentleman, ventilated HEENT: Endotracheal tube in  place, oropharynx moist, no lesions, pupils equal Lungs: Coarse bilateral breath sounds, no wheezes or crackles Cardiovascular: Regular, borderline tachycardic, no murmur Abdomen: Nondistended, positive bowel sounds Extremities: Anasarca to thighs Neuro: Some withdrawal to pain, random movement but nothing meaningful or purposeful  Labs/imaging that I havepersonally reviewed  (right click and "Reselect all SmartList Selections" daily)   Serum creatinine remains elevated but improved from 5/20 All labs and studies reviewed/21    Resolved Hospital Problem list   Lactic acidosis Electrolyte imbalance Elevated troponin, likely due to rhabdo Assessment & Plan:  Acute encephalopathy, initially due to drug overdose.  Concerning for anoxic injury given persistence.  No evidence of active seizures or bacterial meningitis.  Consider HSV -Discussed with Dr. Wilford Corner, appreciate his assistance -Head CT this morning 5/21 -In absence of seizures okay to discontinue continuous EEG monitoring -Antibiotics discontinued 5/20, plan to continue acyclovir until HSV PCR returned -He was started on scheduled Ativan 5/20, decreased to 0.5 mg every 6 hours today 5/21.  Low threshold to discontinue -Has had agitation with wake-up assessments, has not followed any commands.  Plan to attempt to transition propofol to Precedex and see if he has a smoother wake-up, per PAD protocol  Hypertension, question due to drug withdrawal -Continue clonidine 0.3 mg -Control withdrawal symptoms, low-dose narcotics, benzo  Leukocytosis, no clear infectious source -follow clinically, fever curve -Repeat CBC a.m. 5/22  Acute hypoxic respiratory failure due to encephalopathy, impaired airway protection.  Pleural effusions and volume status playing a role in his persistent hypoxemia -Continue to wean PEEP and FiO2 as able, currently 0.50, PEEP 8 -Diuresis if his renal function will allow, careful to avoid volume overload -VAP  prevention orders -PAD protocol as above -Assess for SBT  AKI due to rhabdomyolysis Hypervolemia -He has been adequately volume resuscitated in fact now severe hypervolemia -If serum creatinine, renal function will allow then initiate diuresis -Stop maintenance IV fluids 5/21 -Avoid nephrotoxins, renally adjust medication dosing  Anemia (normocytic, normochromic) -continue to follow CBC.  Suspect some component of hemodilution  Elevated transaminases -Repeat LFT 5/22  IVDU -HIV checked, nonreactive -No evidence of endocarditis on TTE -Will need substance abuse counseling if he survives this illness  Low albumin-protein calorie malnutrition -Tube feeding underway   Best practice (right click and "Reselect all SmartList Selections" daily)  Diet:  Tube Feed  Pain/Anxiety/Delirium protocol (if indicated): Yes (RASS goal -1) VAP protocol (if indicated): Yes DVT prophylaxis: LMWH GI prophylaxis: PPI Glucose control:  SSI Yes Central venous access:  N/A Arterial line:  N/A Foley:  Yes, and it is still needed Mobility:  bed rest  PT consulted: N/A Last date of multidisciplinary goals of care discussion [ ]  Code Status:  full code Disposition: ICU  Family: Dr reviewed status with pt's mother 5/21.   Labs   CBC: Recent Labs  Lab 11/10/20 1551 11/11/20 0131 11/14/20 0532 11/15/20 1328  WBC 15.3* 9.7 7.1 13.7*  NEUTROABS 12.1*  --   --   --   HGB 16.6 16.6 10.3* 9.7*  HCT 48.1  48.3 31.7* 29.9*  MCV 89.6 89.8 93.8 96.8  PLT 312 224 163 164    Basic Metabolic Panel: Recent Labs  Lab 11/13/20 0700 11/13/20 1424 11/14/20 0532 11/15/20 0535 11/16/20 0556 11/17/20 0438 11/17/20 0803  NA 138 139 140 140 139  --  141  K 3.0* 3.6 3.5 3.5 3.8  --  3.8  CL 103 101 105 108 105  --  107  CO2 30 29 28 25 27   --  26  GLUCOSE 148* 137* 112* 129* 134*  --  131*  BUN 36* 39* 42* 44* 55*  --  56*  CREATININE 3.52* 3.65* 3.46* 3.33* 3.37*  --  3.06*  CALCIUM 7.2*  7.5* 7.5* 7.6* 7.9*  --  7.5*  MG 2.2  --  2.3 2.0 2.3 2.6*  --   PHOS 3.9  --  3.2 2.7 3.8 4.9*  --    GFR: Estimated Creatinine Clearance: 45.6 mL/min (A) (by C-G formula based on SCr of 3.06 mg/dL (H)). Recent Labs  Lab 11/10/20 1551 11/10/20 1800 11/10/20 1952 11/11/20 0131 11/11/20 0436 11/14/20 0532 11/15/20 1328  PROCALCITON  --   --   --   --   --   --  3.65  WBC 15.3*  --   --  9.7  --  7.1 13.7*  LATICACIDVEN  --  3.0* 3.0* 2.0* 1.5  --   --     Liver Function Tests: Recent Labs  Lab 11/10/20 1551 11/11/20 0131 11/13/20 0700  AST 669* 672* 252*  ALT 265* 280* 146*  ALKPHOS 93 75 47  BILITOT 1.1 1.1 0.5  PROT 8.8* 6.7 4.7*  ALBUMIN 4.8 3.5 2.1*   Recent Labs  Lab 11/11/20 0131  LIPASE 25   No results for input(s): AMMONIA in the last 168 hours.  ABG    Component Value Date/Time   PHART 7.40 11/10/2020 1720   PCO2ART 34 11/10/2020 1720   PO2ART 178 (H) 11/10/2020 1720   HCO3 21.1 11/10/2020 1720   O2SAT 99.6 11/10/2020 1720     Coagulation Profile: No results for input(s): INR, PROTIME in the last 168 hours.  Cardiac Enzymes: Recent Labs  Lab 11/11/20 0131 11/12/20 0739 11/13/20 0700 11/14/20 0532 11/15/20 0535  CKTOTAL 47,139* 15,401* 11,430* 8,856* 7,892*    HbA1C: Hgb A1c MFr Bld  Date/Time Value Ref Range Status  11/11/2020 01:31 AM 5.1 4.8 - 5.6 % Final    Comment:    (NOTE) Pre diabetes:          5.7%-6.4%  Diabetes:              >6.4%  Glycemic control for   <7.0% adults with diabetes     CBG: Recent Labs  Lab 11/16/20 1516 11/16/20 1916 11/16/20 2316 11/17/20 0326 11/17/20 0818  GLUCAP 127* 126* 127* 138* 136*    Independent critical care time 33 minutes   11/19/20, MD, PhD 11/17/2020, 10:00 AM [facility] [phone] or if no answer before 7:00PM call [phone] For any issues after 7:00PM please call eLink [phone]

## 2020-11-17 NOTE — Progress Notes (Signed)
Neurology Progress Note   S://  Seen and examined. Still encephalopathic and requiring sedation due to agitation   O:// Current vital signs: BP (!) 161/81   Pulse 84   Temp 99.8 F (37.7 C) (Axillary)   Resp (!) 21   Wt 114.9 kg   SpO2 98%   BMI 36.35 kg/m  Vital signs in last 24 hours: Temp:  [98.4 F (36.9 C)-101.1 F (38.4 C)] 99.8 F (37.7 C) (05/21 0800) Pulse Rate:  [81-114] 84 (05/21 0900) Resp:  [15-26] 21 (05/21 0900) BP: (142-162)/(72-86) 161/81 (05/21 0900) SpO2:  [90 %-100 %] 98 % (05/21 0900) FiO2 (%):  [50 %-60 %] 50 % (05/21 0826) Weight:  [114.9 kg] 114.9 kg (05/21 0400) General: Intubated, sedated on propofol-held for sedation HEENT: Normocephalic, atraumatic, multiple rashes on the face. Lungs: Vented Cardiovascular: Regular rate rhythm Abdomen soft nondistended nontender Extremities: extremely edematous all fours Neurologic exam Mental status: Intubated, on sedation with propofol-held for exam Spontanously moves all fours No response to voice. Cranials: Pupils 2 mm sluggish bilaterally, mildly disconjugate gaze, positive corneals, breathing over the ventilator, difficult to ascertain facial symmetry. Motor exam: Some withdrawal to noxious stimulation in lower extremities.  None on uppers. Sensory exam: As above Coordination cannot be assessed   Medications  Current Facility-Administered Medications:  .  0.9 %  sodium chloride infusion, , Intravenous, PRN, Jacky Kindle, MD, Last Rate: 10 mL/hr at 11/16/20 1101, 500 mL at 11/16/20 1101 .  acetaminophen (TYLENOL) tablet 650 mg, 650 mg, Per Tube, Q6H PRN, Jacky Kindle, MD, 650 mg at 11/16/20 2026 .  acyclovir (ZOVIRAX) 780 mg in dextrose 5 % 150 mL IVPB, 10 mg/kg, Intravenous, Q12H, von Caren Griffins, RPH, Stopped at 11/16/20 2226 .  chlorhexidine gluconate (MEDLINE KIT) (PERIDEX) 0.12 % solution 15 mL, 15 mL, Mouth Rinse, BID, Stretch, Marily Lente, MD, 15 mL at 11/17/20 0925 .  Chlorhexidine  Gluconate Cloth 2 % PADS 6 each, 6 each, Topical, Daily, Julian Hy, DO, 6 each at 11/15/20 1950 .  cloNIDine (CATAPRES) tablet 0.3 mg, 0.3 mg, Per Tube, TID, Jacky Kindle, MD, 0.3 mg at 11/17/20 0924 .  docusate (COLACE) 50 MG/5ML liquid 100 mg, 100 mg, Per Tube, BID, Lafayette Dragon, MD, 100 mg at 11/13/20 0943 .  docusate (COLACE) 50 MG/5ML liquid 100 mg, 100 mg, Per Tube, BID PRN, Jacky Kindle, MD .  enoxaparin (LOVENOX) injection 40 mg, 40 mg, Subcutaneous, Q24H, Chand, Sudham, MD, 40 mg at 11/16/20 1221 .  feeding supplement (PROSource TF) liquid 45 mL, 45 mL, Per Tube, BID, Jacky Kindle, MD, 45 mL at 11/17/20 0925 .  feeding supplement (VITAL 1.5 CAL) liquid 1,000 mL, 1,000 mL, Per Tube, Continuous, Chand, Sudham, MD, Last Rate: 65 mL/hr at 11/16/20 0859, 1,000 mL at 11/16/20 0859 .  fentaNYL (SUBLIMAZE) injection 50-100 mcg, 50-100 mcg, Intravenous, Q2H PRN, Frederik Pear, MD, 100 mcg at 11/16/20 1710 .  folic acid (FOLVITE) tablet 1 mg, 1 mg, Per Tube, Daily, Noemi Chapel P, DO, 1 mg at 11/17/20 7564 .  hydrALAZINE (APRESOLINE) injection 10-20 mg, 10-20 mg, Intravenous, Q4H PRN, Frederik Pear, MD, 10 mg at 11/17/20 0925 .  insulin aspart (novoLOG) injection 0-6 Units, 0-6 Units, Subcutaneous, Q4H, Lafayette Dragon, MD, 1 Units at 11/15/20 0340 .  ipratropium-albuterol (DUONEB) 0.5-2.5 (3) MG/3ML nebulizer solution 3 mL, 3 mL, Nebulization, Q6H PRN, Lafayette Dragon, MD .  lactated ringers infusion, , Intravenous, Continuous, Jacky Kindle, MD, Paused at 11/17/20 0758 .  LORazepam (ATIVAN) tablet 1 mg, 1 mg, Per Tube, Q6H, Amie Portland, MD, 1 mg at 11/17/20 0454 .  MEDLINE mouth rinse, 15 mL, Mouth Rinse, 10 times per day, Stretch, Marily Lente, MD, 15 mL at 11/17/20 0645 .  multivitamin with minerals tablet 1 tablet, 1 tablet, Per Tube, Daily, Jacky Kindle, MD, 1 tablet at 11/17/20 0924 .  pantoprazole sodium (PROTONIX) 40 mg/20 mL oral suspension 40 mg, 40 mg, Per Tube,  Daily, Jacky Kindle, MD, 40 mg at 11/17/20 0924 .  polyethylene glycol (MIRALAX / GLYCOLAX) packet 17 g, 17 g, Per Tube, Daily, Lafayette Dragon, MD, 17 g at 11/13/20 0942 .  polyethylene glycol (MIRALAX / GLYCOLAX) packet 17 g, 17 g, Per Tube, Daily PRN, Tacy Learn, Sudham, MD .  polyvinyl alcohol (LIQUIFILM TEARS) 1.4 % ophthalmic solution 1 drop, 1 drop, Both Eyes, TID, Lafayette Dragon, MD, 1 drop at 11/16/20 2127 .  propofol (DIPRIVAN) 1000 MG/100ML infusion, 5-80 mcg/kg/min, Intravenous, Titrated, Ogan, Okoronkwo U, MD, Last Rate: 14.79 mL/hr at 11/17/20 0800, 25 mcg/kg/min at 11/17/20 0800 .  QUEtiapine (SEROQUEL) tablet 50 mg, 50 mg, Per Tube, BID, Jacky Kindle, MD, 50 mg at 11/17/20 0924 .  sodium chloride flush (NS) 0.9 % injection 10-40 mL, 10-40 mL, Intracatheter, Q12H, Chand, Currie Paris, MD, 10 mL at 11/16/20 2127 .  sodium chloride flush (NS) 0.9 % injection 10-40 mL, 10-40 mL, Intracatheter, PRN, Jacky Kindle, MD .  thiamine tablet 100 mg, 100 mg, Per Tube, Daily, Noemi Chapel P, DO, 100 mg at 11/17/20 0924 Labs CBC    Component Value Date/Time   WBC 13.7 (H) 11/15/2020 1328   RBC 3.09 (L) 11/15/2020 1328   HGB 9.7 (L) 11/15/2020 1328   HCT 29.9 (L) 11/15/2020 1328   PLT 164 11/15/2020 1328   MCV 96.8 11/15/2020 1328   MCH 31.4 11/15/2020 1328   MCHC 32.4 11/15/2020 1328   RDW 12.8 11/15/2020 1328   LYMPHSABS 1.2 11/10/2020 1551   MONOABS 1.9 (H) 11/10/2020 1551   EOSABS 0.0 11/10/2020 1551   BASOSABS 0.0 11/10/2020 1551    CMP     Component Value Date/Time   NA 141 11/17/2020 0803   K 3.8 11/17/2020 0803   CL 107 11/17/2020 0803   CO2 26 11/17/2020 0803   GLUCOSE 131 (H) 11/17/2020 0803   BUN 56 (H) 11/17/2020 0803   CREATININE 3.06 (H) 11/17/2020 0803   CALCIUM 7.5 (L) 11/17/2020 0803   PROT 4.7 (L) 11/13/2020 0700   ALBUMIN 2.1 (L) 11/13/2020 0700   AST 252 (H) 11/13/2020 0700   ALT 146 (H) 11/13/2020 0700   ALKPHOS 47 11/13/2020 0700   BILITOT 0.5 11/13/2020  0700   GFRNONAA 27 (L) 11/17/2020 0803  UDS +opiates, +amphetamines, +THC Blood cultures unremarkable thus far  CK down to 8856 Troponins 1848--> 2806--> 6533.  On 11/11/2020.  Imaging I have reviewed images in epic and the results pertinent to this consultation are: CT-scan of the brain IMPRESSION: Normal appearing brain parenchyma. No mass, hemorrhage, or extra-axial fluid collection. Small right frontal scalp hematoma with underlying bony calvarium intact. Paranasal sinus disease at several sites noted.  Agree with rads report on personal review.  MRI brain completed yesterday with subtle confluent areas of mildly restricted diffusion predominantly in the bilateral perirolandic area-differentials include hypoxic ischemic injury, uremic and other toxic metabolic encephalopathies.  Minimal to none T2/flair correlate  2D echocardiogram unremarkable  Assessment:  29 year old man, past history of IV drug use presenting for unresponsiveness in the  setting of amphetamine use.  Neurological examination was notable for obtundation despite being off of sedation and has intact brainstem reflexes which he continues to maintain. CT head with no acute abnormality-although MRI with subtle confluent areas of mildly restricted diffusion in the bilateral perirolandic area indicating possible hypoxic ischemic damage. Noted to have generalized tonic-clonic shaking in the ED for which she was hooked up to continuous EEG after loading with Keppra. No seizures on the EEG Differentials:- Toxic metabolic encephalopathy in the setting of substance use and hypoxic ischemic encephalopathy.  Continuous EEG negative for seizures LP unsuccessful at bedside x2. Appreciate radiology assistance-for IR guided spinal tap CSF with glucose 75, protein 15, RBC 36, WBC 4-not impressive for bacterial or viral meningitis but has been treated now for a few days.   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  Management of withdrawal per primary team as you are.  CT head for now  Will consider EEG if still remains unremarkable.  I would also look at his renal function more carefully as it has not shown really any improvement in the last few days and that might be contributing to his severe encephalopathy Discussed my plan with Kennieth Rad and Dr. Lamonte Sakai the unit  Will follow.  -- Amie Portland, MD Neurologist [facility] Pager: [phone]   Addendum CT head negative   CRITICAL CARE ATTESTATION Performed by: Amie Portland, MD Total critical care time: 30 minutes Critical care time was exclusive of separately billable procedures and treating other patients and/or supervising APPs/Residents/Students Critical care was necessary to treat or prevent imminent or life-threatening deterioration due to toxic metabolic encephalopathy, hypoxic ischemic encephalopathy, This patient is critically ill and at significant risk for neurological worsening and/or death and care requires constant monitoring. Critical care was time spent personally by me on the following activities: development of treatment plan with patient and/or surrogate as well as nursing, discussions with consultants, evaluation of patient's response to treatment, examination of patient, obtaining history from patient or surrogate, ordering and performing treatments and interventions, ordering and review of laboratory studies, ordering and review of radiographic studies, pulse oximetry, re-evaluation of patient's condition, participation in multidisciplinary rounds and medical decision making of high complexity in the care of this patient.

## 2020-11-17 NOTE — Procedures (Addendum)
Patient Name:Nathaniel Lynn ASN:[id number] Epilepsy Attending:Kelcey Korus Annabelle Harman Referring Physician/Provider:Dr Jackson Latino Duration:11/16/2020 1250 to 11/17/2020 1250  Patient history:30 y.o.malewith PMH significant for IVDU who presents withunresponsiveness in the setting of amphetamineuse. EEG to evaluate for seizure  Level of alertness:comatose  AEDs during EEG study:Propofol  Technical aspects: This EEG study was done with scalp electrodes positioned according to the 10-20 International system of electrode placement. Electrical activity was acquired at a sampling rate of 500Hz  and reviewed with a high frequency filter of 70Hz  and a low frequency filter of 1Hz . EEG data were recorded continuously and digitally stored.   Description: EEG showed continuous generalized 3 to 6 Hz theta-delta slowing with overriding 15-18Hz  generalized beta activity.Hyperventilation and photic stimulation were not performed.   ABNORMALITY - Continuous slow, generalized  IMPRESSION: This study is suggestive ofsevere diffuse encephalopathy, nonspecific etiology but likely related to sedation.No seizures or epileptiform discharges were seen throughout the recording.   Donovan Persley 

## 2020-11-18 ENCOUNTER — Inpatient Hospital Stay (HOSPITAL_COMMUNITY): Payer: Self-pay

## 2020-11-18 DIAGNOSIS — G9340 Encephalopathy, unspecified: Secondary | ICD-10-CM

## 2020-11-18 DIAGNOSIS — J9601 Acute respiratory failure with hypoxia: Secondary | ICD-10-CM

## 2020-11-18 LAB — GLUCOSE, CAPILLARY
Glucose-Capillary: 127 mg/dL — ABNORMAL HIGH (ref 70–99)
Glucose-Capillary: 159 mg/dL — ABNORMAL HIGH (ref 70–99)
Glucose-Capillary: 142 mg/dL — ABNORMAL HIGH (ref 70–99)
Glucose-Capillary: 125 mg/dL — ABNORMAL HIGH (ref 70–99)
Glucose-Capillary: 138 mg/dL — ABNORMAL HIGH (ref 70–99)
Glucose-Capillary: 141 mg/dL — ABNORMAL HIGH (ref 70–99)

## 2020-11-18 LAB — BASIC METABOLIC PANEL
Anion gap: 7 (ref 5–15)
BUN: 61 mg/dL — ABNORMAL HIGH (ref 6–20)
CO2: 25 mmol/L (ref 22–32)
Calcium: 7.5 mg/dL — ABNORMAL LOW (ref 8.9–10.3)
Chloride: 113 mmol/L — ABNORMAL HIGH (ref 98–111)
Creatinine, Ser: 2.63 mg/dL — ABNORMAL HIGH (ref 0.61–1.24)
GFR, Estimated: 33 mL/min — ABNORMAL LOW (ref >60)
Glucose, Bld: 153 mg/dL — ABNORMAL HIGH (ref 70–99)
Potassium: 4.1 mmol/L (ref 3.5–5.1)
Sodium: 145 mmol/L (ref 135–145)

## 2020-11-18 LAB — CBC
HCT: 28.7 % — ABNORMAL LOW (ref 39.0–52.0)
Hemoglobin: 9.2 g/dL — ABNORMAL LOW (ref 13.0–17.0)
MCH: 31.2 pg (ref 26.0–34.0)
MCHC: 32.1 g/dL (ref 30.0–36.0)
MCV: 97.3 fL (ref 80.0–100.0)
Platelets: 276 10*3/uL (ref 150–400)
RBC: 2.95 MIL/uL — ABNORMAL LOW (ref 4.22–5.81)
RDW: 13.1 % (ref 11.5–15.5)
WBC: 9.1 10*3/uL (ref 4.0–10.5)
nRBC: 0 % (ref 0.0–0.2)

## 2020-11-18 LAB — HEPATIC FUNCTION PANEL
ALT: 118 U/L — ABNORMAL HIGH (ref 0–44)
AST: 135 U/L — ABNORMAL HIGH (ref 15–41)
Albumin: 1.8 g/dL — ABNORMAL LOW (ref 3.5–5.0)
Alkaline Phosphatase: 49 U/L (ref 38–126)
Bilirubin, Direct: <0.1 mg/dL (ref 0.0–0.2)
Total Bilirubin: 0.5 mg/dL (ref 0.3–1.2)
Total Protein: 5.1 g/dL — ABNORMAL LOW (ref 6.5–8.1)

## 2020-11-18 LAB — CSF CULTURE W GRAM STAIN: Culture: NO GROWTH

## 2020-11-18 LAB — MAGNESIUM: Magnesium: 2.4 mg/dL (ref 1.7–2.4)

## 2020-11-18 LAB — AMMONIA: Ammonia: 12 umol/L (ref 9–35)

## 2020-11-18 MED ORDER — LEVOFLOXACIN IN D5W 750 MG/150ML IV SOLN
750.0000 mg | INTRAVENOUS | Status: AC
  Administered 2020-11-18 – 2020-11-27 (×10): 750 mg via INTRAVENOUS
  Filled 2020-11-18 (×10): qty 150

## 2020-11-18 MED ORDER — OXYCODONE HCL 5 MG PO TABS
5.0000 mg | ORAL_TABLET | Freq: Three times a day (TID) | ORAL | Status: DC
  Administered 2020-11-18 – 2020-11-21 (×10): 5 mg
  Filled 2020-11-18 (×10): qty 1

## 2020-11-18 MED ORDER — FUROSEMIDE 10 MG/ML IJ SOLN
60.0000 mg | Freq: Once | INTRAMUSCULAR | Status: AC
  Administered 2020-11-18: 60 mg via INTRAVENOUS
  Filled 2020-11-18: qty 6

## 2020-11-18 NOTE — Progress Notes (Signed)
NAME:  JARNELL CORDARO, MRN:  [id number], DOB:  [birth year], LOS: 8 ADMISSION DATE:  11/10/2020, CONSULTATION DATE:  11/11/20 REFERRING MD:  Valeria Batman, CHIEF COMPLAINT:   hypoxia  History of Present Illness:  Mr. Clancey Welton. Davidow is a 30 year old young man with PMH significant for narcotic drug abuse.  The patient's mother reported that the patient had been released from prison 2 weeks ago after serving a 2 year sentence.  Since his release, he has been involved in drug abuse with his girlfriend.    The patient's mother reported that her daughter called her around 30 AM reporting that she was unable to reach her brother as he was not answering his phone.  She told her mother that the patient's probation officer called her to report that the patient's ankle monitoring device had sounded an alarm and the probation officer was unable to reach her brother. The daughter tried several times and when she could not reach her brother she called her mother at 32 AM who was already at work.   The sister was finally able to get another family member to go to the patient's girlfriend's house in search of her brother.  When she arrived around 3 pm, she found her brother unresponsive.   The girlfriend confirmed that they were getting "high". The girlfriend reported that she had given the patient narcan but she was still unable to arouse him.  Hence at that time 911 was called and the patient was taken to [facility] for evaluation. It is reported that the initial blood sugar was 140 in the field. [facility] nurse also documented that patient initially localized to painful stimuli.  The patient's toxicology screen came back positive for Amphetamines, opiates and cannabinoid. He was found to have acute kidney injury and CK level was > 50 thousand.  CT head was negative for acute pathology and lactic acid was elevated.  While in the ED, the patient was given Ativan x 2 for tonic-clonic seizure like activity.  The patient was  intubated shortly after seizure like activity was noted.  He was also found to be " posturing" therefore the neurologist recommended transfer to [facility] for continuous EEG monitoring.  I have seen and examine the patient.  He is unable to contribute to this consultation.  Hence this consultation was done with the assistance of the patient's mother and review of [facility] records.  Pertinent  Medical History  Drug abuse  Significant Hospital Events: Including procedures, antibiotic start and stop dates in addition to other pertinent events   . 5/14 admitted, intubated, transferred to [facility]. Started on empiric abx- vanc, cefepime, flagyl . 5/16 change cefepime to ceftriaxone, unable to get CSF from lumbar puncture, developed intermittent hypertension (SBP went up to 208) . 5/19- lumbar puncture was uneventful . 5/20-antibiotics discontinued following reassuring LP.  HSV pending . 5/21 HSV neg, acyclovir stopped, slowly improving renal function, remains on propofol but no purposeful movement, EEG neg, CTH neg   Interim History / Subjective:   HSV neg 5/21, acyclovir d/c'd  tmax 100.9 Copious secretions, remains on 70%/ 8 Trach asp w/ stenotrophomonas and flavobacterium indologenes pending sensitives precedex down to 1.2-> 0.6  For neuro- localized in upper extremities to noxious and w/d in LE however since got seroquel and clonidine per tube and on my assessment is unresponsive.   UOP 2.1L/ 24 hrs Remains net +27L EEG and CTH 5/21 neg  Objective   Blood pressure (!) 160/80, pulse 97, temperature (!) 100.9  F (38.3 C), temperature source Rectal, resp. rate (!) 24, weight 112.9 kg, SpO2 97 %.    Vent Mode: PRVC FiO2 (%):  [50 %-70 %] 70 % Set Rate:  [16 bmp] 16 bmp Vt Set:  [580 mL] 580 mL PEEP:  [8 cmH20] 8 cmH20 Plateau Pressure:  [22 cmH20-26 cmH20] 22 cmH20   Intake/Output Summary (Last 24 hours) at 11/18/2020 1105 Last data filed at 11/18/2020 1000 Gross per 24 hour  Intake 2528.42 ml   Output 2325 ml  Net 203.42 ml   Filed Weights   11/16/20 0400 11/17/20 0400 11/18/20 0400  Weight: 111.6 kg 114.9 kg 112.9 kg    Examination: General:  Critically ill adult male in NAD HEENT: MM pink/moist, ETT, OGT, pupils 4/brisk, anicertic Neuro: unresponsive to noxious stimuli CV: rr, NSR, no murmur PULM:  MV supported breaths, b/l rhonchi, copious tan secretions, +cough GI: soft, bs+ active, condom cath, flexi seal Extremities: warm/dry, anasarca  Skin: no rashes, multiple tattoos, old scars to extremities   Labs/imaging that I havepersonally reviewed  (right click and "Reselect all SmartList Selections" daily)   Slowly improving LFTs CBC- stable BMET- improving sCr 3-> 2.63    Resolved Hospital Problem list   Lactic acidosis Electrolyte imbalance Elevated troponin, likely due to rhabdo Assessment & Plan:   Toxic/ metabolic encephalopathy with possible/ probable hypoxic ischemic encephalopathy in the setting of drug OD, unclear downtime  HSV neg 5/21, acyclovir neg UDS positive for amphetamines, THC, opiates CSF cx unremarkable, neg for viral or bacterial process  EEG neg thus far 5/21) [facility] 5/21 neg - Greatly appreciate Neurology assistance  - continue to minimize sedation - some concern of withdrawal.  Will add oxy IR 5mg  TID for now, continue ativan 0.5mg  TID, clonidine 0.3mg  TID, and Seroquel 50mg  BID.   - wean off precedex, previously just agitated and non purposeful off continuous gtts w/resp desaturations/ vent dyssynchrony  - correct metabolic derangements - consider repeat MRI if no improvements   Hypertension, question due to drug withdrawal -Continue clonidine 0.3 mg - prn apresoline  - lasix 60mg  x 1, clearly hypervolemic/ third spacing  Leukocytosis, improving  - copious secretions, trach asp 5/19 w/stenotrophomonas and flavobacterium indologenes pending sensitives.  Will start levaquin.  QTc ok, but need to monitor.  Considered cefepime but per  pharmacy, not always sensitive to cefepime.    -follow clinically, fever curve - BC 5/19 ngtd - trend WBC/ fever curve   Acute hypoxic respiratory failure due to encephalopathy, impaired airway protection.  Pleural effusions and volume status playing a role in his persistent hypoxemia Stenotrophomonas and flavobacterium indologenes tracheobronchitis  -Continue MV support, 4-8cc/kg IBW with goal Pplat <30 and DP<15  -VAP prevention protocol/ PPI -PAD protocol for sedation> wean precedex off, continue enteral sedation as above -wean FiO2 as able for SpO2 >92%  -daily SAT & SBT when ready- not today, still on 70%/ 8 - abx as above  AKI due to rhabdomyolysis Hypervolemia - lasix 60 mg x 1 -Trend BMP / urinary output - Replace electrolytes as indicated - Avoid nephrotoxic agents, ensure adequate renal perfusion  Anemia (normocytic, normochromic) - stable. Trend CBC.   Elevated transaminases - slowly improving   IVDU -HIV checked, nonreactive -No evidence of endocarditis on TTE -Will need substance abuse counseling if he survives this illness  Low albumin-protein calorie malnutrition -Tube feeding underway   Best practice (right click and "Reselect all SmartList Selections" daily)  Diet:  Tube Feed  Pain/Anxiety/Delirium protocol (if indicated): Yes (  RASS goal 0) VAP protocol (if indicated): Yes DVT prophylaxis: LMWH GI prophylaxis: PPI Glucose control:  SSI Yes Central venous access:  N/A, PICC - LUE site ok Arterial line:  N/A Foley:  N/A Mobility:  bed rest  PT consulted: N/A Last date of multidisciplinary goals of care discussion [ ]  Code Status:  full code Disposition: ICU  Family: Dr reviewed status with pt's mother 5/21. No family at bedside 5/22 am.   Labs   CBC: Recent Labs  Lab 11/14/20 0532 11/15/20 1328 11/18/20 0522  WBC 7.1 13.7* 9.1  HGB 10.3* 9.7* 9.2*  HCT 31.7* 29.9* 28.7*  MCV 93.8 96.8 97.3  PLT 163 164 276    Basic Metabolic  Panel: Recent Labs  Lab 11/13/20 0700 11/13/20 1424 11/14/20 0532 11/15/20 0535 11/16/20 0556 11/17/20 0438 11/17/20 0803 11/18/20 0522  NA 138   < > 140 140 139  --  141 145  K 3.0*   < > 3.5 3.5 3.8  --  3.8 4.1  CL 103   < > 105 108 105  --  107 113*  CO2 30   < > 28 25 27   --  26 25  GLUCOSE 148*   < > 112* 129* 134*  --  131* 153*  BUN 36*   < > 42* 44* 55*  --  56* 61*  CREATININE 3.52*   < > 3.46* 3.33* 3.37*  --  3.06* 2.63*  CALCIUM 7.2*   < > 7.5* 7.6* 7.9*  --  7.5* 7.5*  MG 2.2  --  2.3 2.0 2.3 2.6*  --  2.4  PHOS 3.9  --  3.2 2.7 3.8 4.9*  --   --    < > = values in this interval not displayed.   GFR: Estimated Creatinine Clearance: 52.6 mL/min (A) (by C-G formula based on SCr of 2.63 mg/dL (H)). Recent Labs  Lab 11/14/20 0532 11/15/20 1328 11/18/20 0522  PROCALCITON  --  3.65  --   WBC 7.1 13.7* 9.1    Liver Function Tests: Recent Labs  Lab 11/13/20 0700 11/18/20 0522  AST 252* 135*  ALT 146* 118*  ALKPHOS 47 49  BILITOT 0.5 0.5  PROT 4.7* 5.1*  ALBUMIN 2.1* 1.8*   No results for input(s): LIPASE, AMYLASE in the last 168 hours. Recent Labs  Lab 11/18/20 0658  AMMONIA 12    ABG    Component Value Date/Time   PHART 7.40 11/10/2020 1720   PCO2ART 34 11/10/2020 1720   PO2ART 178 (H) 11/10/2020 1720   HCO3 21.1 11/10/2020 1720   O2SAT 99.6 11/10/2020 1720     Coagulation Profile: No results for input(s): INR, PROTIME in the last 168 hours.  Cardiac Enzymes: Recent Labs  Lab 11/12/20 0739 11/13/20 0700 11/14/20 0532 11/15/20 0535  CKTOTAL 15,401* 11,430* 8,856* 7,892*    HbA1C: Hgb A1c MFr Bld  Date/Time Value Ref Range Status  11/11/2020 01:31 AM 5.1 4.8 - 5.6 % Final    Comment:    (NOTE) Pre diabetes:          5.7%-6.4%  Diabetes:              >6.4%  Glycemic control for   <7.0% adults with diabetes     CBG: Recent Labs  Lab 11/17/20 1601 11/17/20 1919 11/17/20 2304 11/18/20 0317 11/18/20 0746  GLUCAP 134*  137* 138* 127* 159*    CCT: 30 mins  11/20/20, ACNP [facility]  11/18/2020, 11:06 AM

## 2020-11-18 NOTE — Progress Notes (Addendum)
Neurology Progress Note   S://  Seen and examined. Minimal sedation  O:// Current vital signs: BP (!) 172/80   Pulse (!) 101   Temp (!) 100.9 F (38.3 C) (Rectal)   Resp (!) 22   Wt 112.9 kg   SpO2 97%   BMI 35.71 kg/m  Vital signs in last 24 hours: Temp:  [98.7 F (37.1 C)-100.9 F (38.3 C)] 100.9 F (38.3 C) (05/22 0800) Pulse Rate:  [72-117] 101 (05/22 0900) Resp:  [17-27] 22 (05/22 0900) BP: (142-174)/(70-87) 172/80 (05/22 0900) SpO2:  [93 %-100 %] 97 % (05/22 0900) FiO2 (%):  [50 %-70 %] 70 % (05/22 0800) Weight:  [112.9 kg] 112.9 kg (05/22 0400) General: Intubated, minimal sedated on Precedex. HEENT: Normocephalic, atraumatic, multiple rashes on the face. Lungs: Vented Cardiovascular: Regular rate rhythm Abdomen soft nondistended nontender Extremities: extremely edematous all fours Neurologic exam Mental status: Intubated, minimal sedation with Precedex Spontanously moves all fours off of sedation. To voice opens eyes. Cranials: Pupils 2 mm sluggish bilaterally, mildly disconjugate gaze, positive corneals, breathing over the ventilator, difficult to ascertain facial symmetry. Motor exam: Briskly localizes to noxious stimulation in both upper extremities.  Severely edematous lower extremities-difficult to perform noxious stimulation but to the amount noxious stimulation can be applied, he did exhibit some minimal withdrawal. Sensory exam: As above Coordination cannot be assessed   Medications  Current Facility-Administered Medications:  .  0.9 %  sodium chloride infusion, , Intravenous, PRN, Jacky Kindle, MD, Last Rate: 10 mL/hr at 11/16/20 1101, 500 mL at 11/16/20 1101 .  acetaminophen (TYLENOL) tablet 650 mg, 650 mg, Per Tube, Q6H PRN, Jacky Kindle, MD, 650 mg at 11/16/20 2026 .  chlorhexidine gluconate (MEDLINE KIT) (PERIDEX) 0.12 % solution 15 mL, 15 mL, Mouth Rinse, BID, Stretch, Marily Lente, MD, 15 mL at 11/18/20 0742 .  Chlorhexidine Gluconate Cloth 2 %  PADS 6 each, 6 each, Topical, Daily, Julian Hy, DO, 6 each at 11/17/20 2120 .  cloNIDine (CATAPRES) tablet 0.3 mg, 0.3 mg, Per Tube, TID, Tacy Learn, Sudham, MD, 0.3 mg at 11/18/20 0900 .  dexmedetomidine (PRECEDEX) 400 MCG/100ML (4 mcg/mL) infusion, 0.4-2.4 mcg/kg/hr, Intravenous, Continuous, Collene Gobble, MD, Last Rate: 17.24 mL/hr at 11/18/20 0909, 0.6 mcg/kg/hr at 11/18/20 0909 .  docusate (COLACE) 50 MG/5ML liquid 100 mg, 100 mg, Per Tube, BID, Lafayette Dragon, MD, 100 mg at 11/13/20 0943 .  docusate (COLACE) 50 MG/5ML liquid 100 mg, 100 mg, Per Tube, BID PRN, Jacky Kindle, MD .  enoxaparin (LOVENOX) injection 40 mg, 40 mg, Subcutaneous, Q24H, Chand, Sudham, MD, 40 mg at 11/17/20 1213 .  feeding supplement (PROSource TF) liquid 45 mL, 45 mL, Per Tube, BID, Jacky Kindle, MD, 45 mL at 11/18/20 0901 .  feeding supplement (VITAL 1.5 CAL) liquid 1,000 mL, 1,000 mL, Per Tube, Continuous, Chand, Sudham, MD, Last Rate: 65 mL/hr at 11/16/20 0859, 1,000 mL at 11/16/20 0859 .  fentaNYL (SUBLIMAZE) injection 50-100 mcg, 50-100 mcg, Intravenous, Q2H PRN, Frederik Pear, MD, 100 mcg at 11/17/20 1317 .  folic acid (FOLVITE) tablet 1 mg, 1 mg, Per Tube, Daily, Noemi Chapel P, DO, 1 mg at 11/18/20 0900 .  hydrALAZINE (APRESOLINE) injection 10-20 mg, 10-20 mg, Intravenous, Q4H PRN, Frederik Pear, MD, 20 mg at 11/18/20 0758 .  insulin aspart (novoLOG) injection 0-6 Units, 0-6 Units, Subcutaneous, Q4H, Lafayette Dragon, MD, 1 Units at 11/18/20 [id number] .  ipratropium-albuterol (DUONEB) 0.5-2.5 (3) MG/3ML nebulizer solution 3 mL, 3 mL, Nebulization, Q6H PRN, Celesta Aver,  Berton Mount, MD .  LORazepam (ATIVAN) tablet 0.5 mg, 0.5 mg, Per Tube, Q6H, Collene Gobble, MD, 0.5 mg at 11/18/20 0505 .  MEDLINE mouth rinse, 15 mL, Mouth Rinse, 10 times per day, Stretch, Marily Lente, MD, 15 mL at 11/18/20 0901 .  multivitamin with minerals tablet 1 tablet, 1 tablet, Per Tube, Daily, Jacky Kindle, MD, 1 tablet at 11/18/20  0900 .  pantoprazole sodium (PROTONIX) 40 mg/20 mL oral suspension 40 mg, 40 mg, Per Tube, Daily, Chand, Sudham, MD, 40 mg at 11/18/20 0900 .  polyethylene glycol (MIRALAX / GLYCOLAX) packet 17 g, 17 g, Per Tube, Daily, Lafayette Dragon, MD, 17 g at 11/13/20 0942 .  polyethylene glycol (MIRALAX / GLYCOLAX) packet 17 g, 17 g, Per Tube, Daily PRN, Tacy Learn, Sudham, MD .  polyvinyl alcohol (LIQUIFILM TEARS) 1.4 % ophthalmic solution 1 drop, 1 drop, Both Eyes, TID, Lafayette Dragon, MD, 1 drop at 11/18/20 0901 .  propofol (DIPRIVAN) 1000 MG/100ML infusion, 5-80 mcg/kg/min, Intravenous, Titrated, Ogan, Kerry Kass, MD, Stopped at 11/17/20 1549 .  QUEtiapine (SEROQUEL) tablet 50 mg, 50 mg, Per Tube, BID, Chand, Sudham, MD, 50 mg at 11/18/20 0900 .  sodium chloride flush (NS) 0.9 % injection 10-40 mL, 10-40 mL, Intracatheter, Q12H, Chand, Sudham, MD, 30 mL at 11/18/20 0901 .  sodium chloride flush (NS) 0.9 % injection 10-40 mL, 10-40 mL, Intracatheter, PRN, Jacky Kindle, MD .  thiamine tablet 100 mg, 100 mg, Per Tube, Daily, Julian Hy, DO, 100 mg at 11/18/20 0900 Labs CBC    Component Value Date/Time   WBC 9.1 11/18/2020 0522   RBC 2.95 (L) 11/18/2020 0522   HGB 9.2 (L) 11/18/2020 0522   HCT 28.7 (L) 11/18/2020 0522   PLT 276 11/18/2020 0522   MCV 97.3 11/18/2020 0522   MCH 31.2 11/18/2020 0522   MCHC 32.1 11/18/2020 0522   RDW 13.1 11/18/2020 0522   LYMPHSABS 1.2 11/10/2020 1551   MONOABS 1.9 (H) 11/10/2020 1551   EOSABS 0.0 11/10/2020 1551   BASOSABS 0.0 11/10/2020 1551    CMP     Component Value Date/Time   NA 145 11/18/2020 0522   K 4.1 11/18/2020 0522   CL 113 (H) 11/18/2020 0522   CO2 25 11/18/2020 0522   GLUCOSE 153 (H) 11/18/2020 0522   BUN 61 (H) 11/18/2020 0522   CREATININE 2.63 (H) 11/18/2020 0522   CALCIUM 7.5 (L) 11/18/2020 0522   PROT 5.1 (L) 11/18/2020 0522   ALBUMIN 1.8 (L) 11/18/2020 0522   AST 135 (H) 11/18/2020 0522   ALT 118 (H) 11/18/2020 0522   ALKPHOS  49 11/18/2020 0522   BILITOT 0.5 11/18/2020 0522   GFRNONAA 33 (L) 11/18/2020 0522  UDS +opiates, +amphetamines, +THC Blood cultures unremarkable thus far  CK down to 8856 Troponins 1848--> 2806--> 6533.  On 11/11/2020.  Blood and CSF cultures have been unremarkable thus far  Imaging I have reviewed images in epic and the results pertinent to this consultation are: CT-scan of the brain IMPRESSION: Normal appearing brain parenchyma. No mass, hemorrhage, or extra-axial fluid collection. Small right frontal scalp hematoma with underlying bony calvarium intact. Paranasal sinus disease at several sites noted.  Agree with rads report on personal review.  MRI brain completed 11/12/2020-with subtle confluent areas of mildly restricted diffusion predominantly in the bilateral perirolandic area-differentials include hypoxic ischemic injury, uremic and other toxic metabolic encephalopathies.  Minimal to none T2/flair correlate  2D echocardiogram unremarkable  Assessment:  30 year old man, past history of IV  drug use presenting for unresponsiveness in the setting of amphetamine use.  Unclear downtime. Neurological examination was notable for obtundation despite being off of sedation and has intact brainstem reflexes which he continues to maintain. Spontaneously moving all 4 extremities with agitation off of sedation. Today, some eye-opening to voice and brisk localization with upper extremities with sedation having been changed to Precedex.  Renal function still remains deranged although some improvement in creatinine.  CT head with no acute abnormality-although MRI with subtle confluent areas of mildly restricted diffusion in the bilateral perirolandic area indicating possible hypoxic ischemic damage.  Noted to have generalized tonic-clonic shaking in the ED for which she was hooked up to continuous EEG after loading with Keppra. No seizures on the EEG  Differentials:- Toxic metabolic  encephalopathy in the setting of substance use and hypoxic ischemic encephalopathy.  Continuous EEG negative for seizures LP unsuccessful at bedside x2. Appreciate radiology assistance-for IR guided spinal tap CSF with glucose 75, protein 15, RBC 36, WBC 4-not impressive for bacterial or viral meningitis but has been treated now for a few days.   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  Management of withdrawal per primary team as you are.  Management of severely deranged renal function per primary team as you are.  CT head repeated yesterday-no acute changes.  If exam does not show improvement, will consider repeating an EEG tomorrow.  Also might consider repeating an MRI to see if the extent of hypoxic/ischemic damage is more than what was seen on the initial MRI or if there is evidence of deep gray matter insult as is seen by toxins and hypoxia which might be an explanation for his deranged mental status.   Discussed my plan with Kennieth Rad in the unit.  Will follow.  -- Amie Portland, MD Neurologist [facility] Pager: [phone]

## 2020-11-19 ENCOUNTER — Inpatient Hospital Stay (HOSPITAL_COMMUNITY): Payer: Self-pay

## 2020-11-19 DIAGNOSIS — J690 Pneumonitis due to inhalation of food and vomit: Secondary | ICD-10-CM

## 2020-11-19 LAB — CBC
HCT: 29.4 % — ABNORMAL LOW (ref 39.0–52.0)
Hemoglobin: 9.2 g/dL — ABNORMAL LOW (ref 13.0–17.0)
MCH: 30.8 pg (ref 26.0–34.0)
MCHC: 31.3 g/dL (ref 30.0–36.0)
MCV: 98.3 fL (ref 80.0–100.0)
Platelets: 329 10*3/uL (ref 150–400)
RBC: 2.99 MIL/uL — ABNORMAL LOW (ref 4.22–5.81)
RDW: 13.1 % (ref 11.5–15.5)
WBC: 9.7 10*3/uL (ref 4.0–10.5)
nRBC: 0 % (ref 0.0–0.2)

## 2020-11-19 LAB — BASIC METABOLIC PANEL
Anion gap: 9 (ref 5–15)
BUN: 64 mg/dL — ABNORMAL HIGH (ref 6–20)
CO2: 27 mmol/L (ref 22–32)
Calcium: 8.2 mg/dL — ABNORMAL LOW (ref 8.9–10.3)
Chloride: 112 mmol/L — ABNORMAL HIGH (ref 98–111)
Creatinine, Ser: 2.7 mg/dL — ABNORMAL HIGH (ref 0.61–1.24)
GFR, Estimated: 32 mL/min — ABNORMAL LOW (ref >60)
Glucose, Bld: 140 mg/dL — ABNORMAL HIGH (ref 70–99)
Potassium: 4.1 mmol/L (ref 3.5–5.1)
Sodium: 148 mmol/L — ABNORMAL HIGH (ref 135–145)

## 2020-11-19 LAB — HEPATIC FUNCTION PANEL
ALT: 150 U/L — ABNORMAL HIGH (ref 0–44)
AST: 151 U/L — ABNORMAL HIGH (ref 15–41)
Albumin: 2 g/dL — ABNORMAL LOW (ref 3.5–5.0)
Alkaline Phosphatase: 66 U/L (ref 38–126)
Bilirubin, Direct: <0.1 mg/dL (ref 0.0–0.2)
Total Bilirubin: 0.5 mg/dL (ref 0.3–1.2)
Total Protein: 5.5 g/dL — ABNORMAL LOW (ref 6.5–8.1)

## 2020-11-19 LAB — CULTURE, RESPIRATORY W GRAM STAIN

## 2020-11-19 LAB — GLUCOSE, CAPILLARY
Glucose-Capillary: 137 mg/dL — ABNORMAL HIGH (ref 70–99)
Glucose-Capillary: 136 mg/dL — ABNORMAL HIGH (ref 70–99)
Glucose-Capillary: 126 mg/dL — ABNORMAL HIGH (ref 70–99)
Glucose-Capillary: 123 mg/dL — ABNORMAL HIGH (ref 70–99)
Glucose-Capillary: 122 mg/dL — ABNORMAL HIGH (ref 70–99)
Glucose-Capillary: 127 mg/dL — ABNORMAL HIGH (ref 70–99)

## 2020-11-19 MED ORDER — LABETALOL HCL 5 MG/ML IV SOLN
10.0000 mg | INTRAVENOUS | Status: DC | PRN
  Administered 2020-11-19 – 2020-11-23 (×6): 10 mg via INTRAVENOUS
  Filled 2020-11-19 (×5): qty 4

## 2020-11-19 NOTE — Progress Notes (Signed)
NAME:  Nathaniel Lynn, MRN:  [id number], DOB:  [birth year], LOS: 9 ADMISSION DATE:  11/10/2020, CONSULTATION DATE:  11/11/2020 REFERRING MD:  Derrill Kay - ED, CHIEF COMPLAINT: Hypoxia  History of Present Illness:  30 year old male with PMHx significant for narcotic drug abuse; recently released from prison 2 weeks ago after serving a 2-year sentence. Sister/probation officer attempted contact 1000 date of admission, unable to reach patient. Found down at girlfriend's house around 1500; per girlfriend reportedly "getting high" but unresponsive to Narcan administration. EMS contacted and patient taken to [facility] for evaluation.   Labs notable for +UDS (amphetamines, opiates, cannabinoids), Cr 2.33 (baseline 0.8-1.2), CK level > 50K, LA 3.0. CT Head was negative for acute pathology. While in the ED, the patient was given Ativan x 2 for tonic-clonic seizure-like activity and was intubated shortly after seizure-like activity was noted. Concern for "posturing". Transferred to [facility] for further evaluation and continuous EEG monitoring.  Pertinent Medical History:  Drug abuse  Significant Hospital Events: Including procedures, antibiotic start and stop dates in addition to other pertinent events   . 5/14 admitted, intubated, transferred to [facility]. Started on empiric abx - vanc, cefepime, flagyl, acyclovir . 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 . 5/19 - Lumbar puncture was uneventful . 5/20 - Antibiotics discontinued following reassuring LP.  HSV pending. . 5/21 - HSV neg, acyclovir stopped, slowly improving renal function, remains on propofol but no purposeful movement, EEG neg, repeat CTH neg. Levaquin started for +TA (stenotrophomonas/flavobacterium) . 5/23 - Off sedation since 5/22AM, following commands consistently. Appears to be purposefully  reaching for ETT with LUE.  Interim History / Subjective:  No significant events overnight Off of sedation since ~0900 5/22 More awake this AM, consistently following commands Trying to reach for ETT with LUE  Objective   Blood pressure (!) 158/78, pulse (!) 101, temperature 98.6 F (37 C), temperature source Axillary, resp. rate (!) 27, weight 112.9 kg, SpO2 93 %.    Vent Mode: PRVC FiO2 (%):  [40 %-70 %] 40 % Set Rate:  [16 bmp] 16 bmp Vt Set:  [580 mL] 580 mL PEEP:  [8 cmH20] 8 cmH20 Plateau Pressure:  [14 cmH20-21 cmH20] 14 cmH20   Intake/Output Summary (Last 24 hours) at 11/19/2020 0747 Last data filed at 11/19/2020 0700 Gross per 24 hour  Intake 2181.22 ml  Output 3825 ml  Net -1643.78 ml   Filed Weights   11/16/20 0400 11/17/20 0400 11/18/20 0400  Weight: 111.6 kg 114.9 kg 112.9 kg   Physical Examination: General: Acutely ill-appearing young male in NAD. HEENT: /AT, anicteric sclera, +R subconjunctival hemorrhage noted. Pupils 85mm and equal/round reactive, moist mucous membranes. ETT in place. Neuro: Lethargic. Responds to verbal stimuli., Responds to tactile stimuli., Responds to noxious stimuli. and Withdraws to pain in all 4 extremities. Following commands consistently. Moves all 4 extremities spontaneously.+Corneal, +Cough and +Gag  CV: RRR, no m/g/r. PULM: Breathing even and unlabored on vent (PEEP 8, FiO2 40%). Lung fields CTAB anteriorly, diminished at bases. GI: Soft, nontender, nondistended. Normoactive bowel sounds. Extremities: Bilateral symmetric 2+ LE/UE edema noted. Skin: Warm/dry, no rashes.  Labs/imaging that I have personally reviewed: (right click and "Reselect all SmartList Selections" daily)  WBC 9.7, H&H 9.2/29.4, Plt 329  Na 148, K 4.1, CO2 27, BUN 64, Cr 2.7 (2.6, peak 3.6 this admission) Ca 8.2  Resolved Hospital Problem List  Lactic acidosis Electrolyte imbalance Elevated troponin, likely due to rhabdo Assessment & Plan:   Toxic/  metabolic encephalopathy with 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 CSF Cx unremarkable, neg for viral or bacterial process, HSV neg 5/21, antimicrobials discontinued. EEG negative. CT Head 5/14, 5/21 negative for acute process. MRI Brain 5/16 c/f encephalopathy (hypoxic ischemic vs. uremic vs. other toxic/metabolic etiology). - Appreciate Neurology assistance - Minimizing sedation as able (off since 5/22 0900) - Continue scheduled Oxycodone TID, Ativan TID, Clonidine TID, Seroquel - Monitor BMP and correct electrolyte derangements as indicated - D/w Neuro re: repeat brain imaging; neuro status appears to be improving  Acute hypoxic respiratory failure due to encephalopathy, impaired airway protection - volume status likely playing a role Stenotrophomonas and flavobacterium indologenes tracheobronchitis (see below) - Continue full vent support (4-8cc/kg IBW) - Wean FiO2 for O2 sat > 90% - Daily WUA/SBT - VAP bundle - Pulmonary hygiene - PAD protocol for sedation: Precedex and Fentanyl PRN for goal RASS 0 to -1, goal to minimize - Continue Levaquin (as below)  Leukocytosis, improving  Copious secretions with trach aspirate 5/19 +stenotrophomonas and flavobacterium indologenes (sensitivities pending).  - Trend WBC/fever curve - F/u finalized Cx data - Continue Levaquin for now - Intermittent CXR  Hypertension, question due to drug withdrawal - Continue clonidine - Hydralazine PRN - Consider additional diuresis (though limited by renal function)  AKI due to rhabdomyolysis Hypervolemia AKI on admission (Cr 2.3, baseline 0.8-1.3). Peaked at 3.6 this admission. CK on admission > 50K. - Trend BMP - Replete electrolytes as indicated - Daily assessment of diuresis needs - Monitor I&Os in the setting of AKI, diuresis - F/u urine studies - Avoid nephrotoxic agents as able - Ensure  adequate renal perfusion  Anemia (normocytic, normochromic) - Trend CBC - Transfuse for Hgb < 7.0  Elevated transaminases - Trend LFTs, improving  IVDU HIV checked, nonreactive. No evidence of endocarditis on TTE - Substance abuse counseling once clinically appropriate  Low albumin Moderate Protein calorie malnutrition - Continue TFs (at goal 12mL/hr)  Best practice (right click and "Reselect all SmartList Selections" daily)  Diet:  Tube Feed  Pain/Anxiety/Delirium protocol (if indicated): Yes (RASS goal 0) VAP protocol (if indicated): Yes DVT prophylaxis: LMWH GI prophylaxis: PPI Glucose control:  SSI Yes Central venous access:  N/A, PICC - LUE site ok Arterial line:  N/A Foley:  N/A Mobility:  bed rest  PT consulted: N/A Last date of multidisciplinary goals of care discussion [ ]  Code Status:  full code Disposition: ICU  Family: Dr reviewed status with pt's mother 5/21. No family at bedside 5/22 am. Plan for parents to be in-house 5/23PM, will readdress/update at that time.  Critical care time: 41 minutes   10-[birth year]1989, Tim Lair [facility] 11/19/20 7:54 AM  Please see [email or web address] for pager details.  From 7A-7P if no response, please call [phone] After hours, please call ELink [phone]

## 2020-11-19 NOTE — Progress Notes (Signed)
Subjective: Slightly more awake than yesterday  Exam: Vitals:   11/19/20 1155 11/19/20 1156  BP: (!) 157/88   Pulse: (!) 113   Resp: (!) 21   Temp:    SpO2: 96% 96%   Gen: In bed, intubated Resp: non-labored breathing, no acute distress Abd: soft, nt  Neuro: MS: Eyes are open, he does not follow commands CN: He does blink to threat bilaterally, he does not look to the right as much as the left Motor: Withdraws bilaterally, less on the right than the left Sensory: As above   Impression: 30 year old male with unresponsiveness in the setting of amphetamine use.  He appears to be making some progress, but I suspect there may be some degree of hypoxic ischemic encephalopathy.  He does appear to be slightly focal, and now that he is off sedation I would favor repeating his EEG to make sure that he is not having subclinical seizures contributing to his persistent mental state.  Recommendations: 1) overnight EEG  2) may consider repeating MRI.  Ritta Slot, MD [facility] [phone]  If 7pm- 7am, please page neurology on call as listed in AMION.

## 2020-11-19 NOTE — Progress Notes (Signed)
LTM EEG hooked up and running - no initial skin breakdown - push button tested - neuro notified. [facility] monitoring.  

## 2020-11-20 ENCOUNTER — Inpatient Hospital Stay (HOSPITAL_COMMUNITY): Payer: Self-pay

## 2020-11-20 LAB — BASIC METABOLIC PANEL
Anion gap: 7 (ref 5–15)
BUN: 62 mg/dL — ABNORMAL HIGH (ref 6–20)
CO2: 26 mmol/L (ref 22–32)
Calcium: 8.6 mg/dL — ABNORMAL LOW (ref 8.9–10.3)
Chloride: 118 mmol/L — ABNORMAL HIGH (ref 98–111)
Creatinine, Ser: 2.68 mg/dL — ABNORMAL HIGH (ref 0.61–1.24)
GFR, Estimated: 32 mL/min — ABNORMAL LOW (ref >60)
Glucose, Bld: 138 mg/dL — ABNORMAL HIGH (ref 70–99)
Potassium: 4.6 mmol/L (ref 3.5–5.1)
Sodium: 151 mmol/L — ABNORMAL HIGH (ref 135–145)

## 2020-11-20 LAB — CBC
HCT: 28.6 % — ABNORMAL LOW (ref 39.0–52.0)
Hemoglobin: 8.8 g/dL — ABNORMAL LOW (ref 13.0–17.0)
MCH: 30.9 pg (ref 26.0–34.0)
MCHC: 30.8 g/dL (ref 30.0–36.0)
MCV: 100.4 fL — ABNORMAL HIGH (ref 80.0–100.0)
Platelets: 367 10*3/uL (ref 150–400)
RBC: 2.85 MIL/uL — ABNORMAL LOW (ref 4.22–5.81)
RDW: 13.2 % (ref 11.5–15.5)
WBC: 9.3 10*3/uL (ref 4.0–10.5)
nRBC: 0 % (ref 0.0–0.2)

## 2020-11-20 LAB — GLUCOSE, CAPILLARY
Glucose-Capillary: 103 mg/dL — ABNORMAL HIGH (ref 70–99)
Glucose-Capillary: 112 mg/dL — ABNORMAL HIGH (ref 70–99)
Glucose-Capillary: 115 mg/dL — ABNORMAL HIGH (ref 70–99)
Glucose-Capillary: 118 mg/dL — ABNORMAL HIGH (ref 70–99)
Glucose-Capillary: 131 mg/dL — ABNORMAL HIGH (ref 70–99)
Glucose-Capillary: 133 mg/dL — ABNORMAL HIGH (ref 70–99)

## 2020-11-20 MED ORDER — FREE WATER
200.0000 mL | Status: DC
  Administered 2020-11-20 – 2020-11-21 (×6): 200 mL

## 2020-11-20 MED ORDER — DEXMEDETOMIDINE HCL IN NACL 400 MCG/100ML IV SOLN
0.2000 ug/kg/h | INTRAVENOUS | Status: DC
  Administered 2020-11-20 – 2020-11-21 (×2): 0.2 ug/kg/h via INTRAVENOUS
  Administered 2020-11-21: 1.2 ug/kg/h via INTRAVENOUS
  Administered 2020-11-21: 1 ug/kg/h via INTRAVENOUS
  Administered 2020-11-22 (×3): 1.2 ug/kg/h via INTRAVENOUS
  Administered 2020-11-22: 0.8 ug/kg/h via INTRAVENOUS
  Administered 2020-11-22 – 2020-11-23 (×3): 1.2 ug/kg/h via INTRAVENOUS
  Administered 2020-11-23: 0.5 ug/kg/h via INTRAVENOUS
  Filled 2020-11-20 (×7): qty 100
  Filled 2020-11-20: qty 200
  Filled 2020-11-20 (×7): qty 100

## 2020-11-20 NOTE — Progress Notes (Signed)
Pt transported to MRI and back to 4N 21 on full vent support. No complications noted.

## 2020-11-20 NOTE — Progress Notes (Signed)
eLink Physician-Brief Progress Note Patient Name: Nathaniel Lynn DOB: 01-26-1991 MRN: [id number]   Date of Service  11/20/2020  HPI/Events of Note  Agitation - Patient has orders for L wrist restraint and requires bilateral.   eICU Interventions  Will order bilateral soft wrist restraints X 12 hours.      Intervention Category Major Interventions: Delirium, psychosis, severe agitation - evaluation and management  Lenell Antu 11/20/2020, 8:57 PM

## 2020-11-20 NOTE — Progress Notes (Signed)
LTM EEG discontinued - no skin breakdown at unhook.   

## 2020-11-20 NOTE — Progress Notes (Signed)
NAME:  Nathaniel Lynn, MRN:  [id number], DOB:  June 18, 1991, LOS: 10 ADMISSION DATE:  11/10/2020, CONSULTATION DATE:  11/11/2020 REFERRING MD:  Derrill Kay - ED, CHIEF COMPLAINT: Hypoxia  History of Present Illness:  30 year old male with PMHx significant for narcotic drug abuse; recently released from prison 2 weeks ago after serving a 2-year sentence. Sister/probation officer attempted contact 1000 date of admission, unable to reach patient. Found down at girlfriend's house around 1500; per girlfriend reportedly "getting high" but unresponsive to Narcan administration. EMS contacted and patient taken to [facility] for evaluation.   Labs notable for +UDS (amphetamines, opiates, cannabinoids), Cr 2.33 (baseline 0.8-1.2), CK level > 50K, LA 3.0. CT Head was negative for acute pathology. While in the ED, the patient was given Ativan x 2 for tonic-clonic seizure-like activity and was intubated shortly after seizure-like activity was noted. Concern for "posturing". Transferred to [facility] for further evaluation and continuous EEG monitoring.  Pertinent Medical History:  Drug abuse, ?kidney dysfunction  Significant Hospital Events: Including procedures, antibiotic start and stop dates in addition to other pertinent events   . 5/14 admitted, intubated, transferred to [facility]. Started on empiric abx - vanc, cefepime, flagyl, acyclovir . 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 . 5/19 - Lumbar puncture was uneventful . 5/20 - Antibiotics discontinued following reassuring LP.  HSV pending. . 5/21 - HSV neg, acyclovir stopped, slowly improving renal function, remains on propofol but no purposeful movement, EEG neg, repeat CTH neg. Levaquin started for +TA (stenotrophomonas/flavobacterium) . 5/23 - Off sedation since 5/22AM, following commands consistently.  Appears to be purposefully reaching for ETT with LUE. Marland Kitchen 5/24 - Opens eyes, does not follow commands today. Continued L-sided purposeful movement. EEG demonstrating moderate diffuse encephalopathy (NOS), no seizures. MRI Brain today per Neuro. Interim History / Subjective:  Continued LTM EEG overnight Per RN report, c/f L leg tremor with R-sided gaze, no seizure on LTM Spontaneous eye-opening, purposeful LUE movement Not following commands today or tracking  Objective   Blood pressure (!) 161/84, pulse (!) 104, temperature 99.4 F (37.4 C), temperature source Axillary, resp. rate (!) 26, weight 112.9 kg, SpO2 94 %.    Vent Mode: PRVC FiO2 (%):  [40 %] 40 % Set Rate:  [13 bmp-16 bmp] 13 bmp Vt Set:  [580 mL] 580 mL PEEP:  [8 cmH20] 8 cmH20 Plateau Pressure:  [19 cmH20-21 cmH20] 19 cmH20   Intake/Output Summary (Last 24 hours) at 11/20/2020 0757 Last data filed at 11/20/2020 0500 Gross per 24 hour  Intake 1850.03 ml  Output 3700 ml  Net -1849.97 ml   Filed Weights   11/16/20 0400 11/17/20 0400 11/18/20 0400  Weight: 111.6 kg 114.9 kg 112.9 kg   Physical Examination: General: Acutely ill-appearing young male in NAD. HEENT: [place]/AT, anicteric sclera, PERRL, moist mucous membranes. Neuro: Lethargic. Responds to noxious stimuli. Withdraws to pain in BUE. Not following commands. Some purposeful/spontaneous movement of LUE. +Corneal, +Cough and +Gag  CV: Mildly tachycardic, no m/g/r. PULM: Breathing even and unlabored on vent (PEEP 8, FiO2 40%). Lung fields with scattered rhonchi. GI: Soft, nontender, nondistended. Normoactive bowel sounds. Extremities: Bilateral symmetric 2+ UE/LE edema noted. Skin: Warm/dry, blister-like linear lesions to medial thigh.  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 CSF Cx unremarkable, neg for viral or bacterial process, HSV neg 5/21, antimicrobials discontinued. EEG negative. CT Head 5/14, 5/21 negative for acute process. MRI Brain 5/16 c/f encephalopathy (hypoxic ischemic vs. uremic vs. other toxic/metabolic etiology). - Appreciate Neurology recommendations - Minimizing sedation as able (continuous sedation off since 5/22AM) - Continue scheduled oxycodone TID, Ativan TID, Clonidine TID, Seroquel - Monitor BMP/correct electrolyte derangements as indicated - LTM EEG per Neuro - no seizures/epileptiform activity - MRI Brain today, 5/24  Acute hypoxic respiratory failure due to encephalopathy, impaired airway protection - volume status likely playing a role Stenotrophomonas and flavobacterium indologenes tracheobronchitis (see below) - Continue full vent support (4-8cc/kg IBW) - Wean FiO2 for O2 sat > 90% - Daily WUA/SBT - VAP bundle - Pulmonary hygiene - PAD protocol for sedation: Fentanyl for goal RASS 0 to -1, minimizing continuous sedation as able - Continue Levaquin  Leukocytosis, improving  Copious secretions with trach aspirate 5/19 +stenotrophomonas and flavobacterium indologenes (sensitivities pending). Steno pan-sensitive, flavo resistant to imipenem and cefazolin, intermediate to ceftaz/gent, sensitive to cipro, Zosyn and Bactrim. - Trend WBC/fever curve - F/u finalized Cx data - Continue Levaquin - Intermittent CXR  Hypertension, question due to drug withdrawal - Continue clonidine - Hydral/Labetalol PRN for goal SBP < 160 - Consider additional diuresis (limited by renal function)  AKI due to  rhabdomyolysis Hypervolemia AKI on admission (Cr 2.3, baseline 0.8-1.3). Peaked at 3.6 this admission. CK on admission > 50K. - Trend BMP - Replete electrolytes as indicated - Monitor I&Os closely in the setting of AKI, diuresis - F/u urine studies - Avoid nephrotoxic agents as able - Ensure adequate renal perfusion  Anemia (normocytic, normochromic) - Trend CBC - Tranfuse for Hgb < 7  Elevated transaminases - Trend LFTs (next 5/25AM)  IVDU HIV checked, nonreactive. No evidence of endocarditis on TTE - Substance abuse counseling once able to participate in care  Low albumin Moderate Protein calorie malnutrition - Continue TFs  Best practice (right click and "Reselect all SmartList Selections" daily)  Diet:  Tube Feed  Pain/Anxiety/Delirium protocol (if indicated): Yes (RASS goal 0) VAP protocol (if indicated): Yes DVT prophylaxis: LMWH GI prophylaxis: PPI Glucose control:  SSI Yes Central venous access:  N/A, PICC - LUE site ok Arterial line:  N/A Foley:  N/A Mobility:  bed rest  PT consulted: N/A Last date of multidisciplinary goals of care discussion [ ]  Code Status:  full code Disposition: ICU  Family: Dr reviewed status with pt's mother 5/21. Awaiting further brain imaging today, 5/24.  Critical care time: 39 minutes   40, Tim Lair [facility] 11/20/20 7:57 AM  Please see [email or web address] for pager details.  From 7A-7P if no response, please call [phone] After hours, please call ELink [phone]

## 2020-11-20 NOTE — Procedures (Addendum)
Patient Name:Nathaniel Lynn WJX:[id number] Epilepsy Attending:Vonn Sliger Annabelle Harman Referring Physician/Provider:Dr Onalee Hua Duration:11/19/2020 2130QM 5/24/20221223  Patient history:30 y.o.malewith PMH significant for IVDU who presents withunresponsiveness in the setting of amphetamineuse. EEG to evaluate for seizure  Level of alertness:lethargic, asleep  AEDs during EEG study:None  Technical aspects: This EEG study was done with scalp electrodes positioned according to the 10-20 International system of electrode placement. Electrical activity was acquired at a sampling rate of 500Hz  and reviewed with a high frequency filter of 70Hz  and a low frequency filter of 1Hz . EEG data were recorded continuously and digitally stored.   Description: No clear posterior dominant rhythm was. Sleep was characterized by vertex waves, sleep spindles (12 to 14 Hz), maximal frontocentral region. EEG showed predominantly continuous generalized 5 to 7 Hz theta slowing as well as intermittent 2 to 3 Hz delta slowing as well as 7.5 to 8 Hz alpha activity. Hyperventilation and photic stimulation were not performed.   Event button was pressed on 11/20/2020 at 1030. Per RN patient was noted to have left leg tremors and eyes looking to right. Concomitant EEG before, during and after the event did not show any EEG changes due to seizure.  ABNORMALITY - Continuous slow, generalized  IMPRESSION: This study is suggestive ofmoderate diffuse encephalopathy, nonspecific etiology.No seizures or epileptiform discharges were seen throughout the recording.  One event was recorded on 11/20/2020 at 1030 during which RN noted patient having left leg tremors and eyes looking to right without concomitant EEG change. This was not an epileptic event.  Calhoun Reichardt 

## 2020-11-20 NOTE — Progress Notes (Signed)
eLink Physician-Brief Progress Note Patient Name: Nathaniel Lynn DOB: [birth year] MRN: [id number]   Date of Service  11/20/2020  HPI/Events of Note  Severe agitation = Patient grabbing for tubes. Tossing leg over bed rail. Trying to sit up in bed. No improvement with Fentanyl IV. Patient with history of polysubstance abuse.   eICU Interventions  Plan: 1. Low dose Precedex IV infusion (0.2-0.6 mcg/kg/hour). Titrate to RASS = 0.     Intervention Category Major Interventions: Delirium, psychosis, severe agitation - evaluation and management  Lenell Antu 11/20/2020, 9:27 PM

## 2020-11-20 NOTE — Progress Notes (Signed)
ET tube holder changed and RN aware of sore on pts left ear.

## 2020-11-20 NOTE — Progress Notes (Addendum)
Subjective: Slightly more awake than yesterday  Exam: Vitals:   11/20/20 0500 11/20/20 0800  BP: (!) 161/84   Pulse: (!) 104   Resp: (!) 26   Temp:  98.4 F (36.9 C)  SpO2: 94%    Gen: In bed, intubated Resp: non-labored breathing, no acute distress Abd: soft, nt  Neuro: MS: Eyes are open, he does not follow commands CN: He does blink to threat bilaterally, he does not look to the right as much as the left, but crosses with dolls maneuver. Motor: purposeful on the left, he will flex vs withdraw his right arm, minimal flicker to nox stim in the left leg.  Sensory: As above   Impression: 30 year old male with unresponsiveness in the setting of amphetamine use.  He appears to be making some progress, but I suspect there may be some degree of hypoxic ischemic encephalopathy.  He does appear to be focal, but no ongoing seizure currently on bedside EEG. I think that repeat imaging is going to be needed.   Recommendations: 1) if no seizure on EEG, will d/c and get MRI brain.   This patient is critically ill and at significant risk of neurological worsening, death and care requires constant monitoring of vital signs, hemodynamics,respiratory and cardiac monitoring, neurological assessment, discussion with family, other specialists and medical decision making of high complexity. I spent 35 minutes of neurocritical care time  in the care of  this patient. This was time spent independent of any time provided by nurse practitioner or PA.  Ritta Slot, MD [facility] [phone]  If 7pm- 7am, please page neurology on call as listed in AMION. 11/20/2020  8:44 AM   If 7pm- 7am, please page neurology on call as listed in AMION.

## 2020-11-21 ENCOUNTER — Inpatient Hospital Stay (HOSPITAL_COMMUNITY): Payer: Self-pay

## 2020-11-21 DIAGNOSIS — T50904A Poisoning by unspecified drugs, medicaments and biological substances, undetermined, initial encounter: Secondary | ICD-10-CM

## 2020-11-21 DIAGNOSIS — J690 Pneumonitis due to inhalation of food and vomit: Secondary | ICD-10-CM

## 2020-11-21 DIAGNOSIS — N179 Acute kidney failure, unspecified: Secondary | ICD-10-CM

## 2020-11-21 DIAGNOSIS — L8990 Pressure ulcer of unspecified site, unspecified stage: Secondary | ICD-10-CM | POA: Insufficient documentation

## 2020-11-21 DIAGNOSIS — E870 Hyperosmolality and hypernatremia: Secondary | ICD-10-CM

## 2020-11-21 LAB — COMPREHENSIVE METABOLIC PANEL
ALT: 166 U/L — ABNORMAL HIGH (ref 0–44)
AST: 130 U/L — ABNORMAL HIGH (ref 15–41)
Albumin: 2.4 g/dL — ABNORMAL LOW (ref 3.5–5.0)
Alkaline Phosphatase: 73 U/L (ref 38–126)
Anion gap: 9 (ref 5–15)
BUN: 55 mg/dL — ABNORMAL HIGH (ref 6–20)
CO2: 25 mmol/L (ref 22–32)
Calcium: 9 mg/dL (ref 8.9–10.3)
Chloride: 121 mmol/L — ABNORMAL HIGH (ref 98–111)
Creatinine, Ser: 2.06 mg/dL — ABNORMAL HIGH (ref 0.61–1.24)
GFR, Estimated: 44 mL/min — ABNORMAL LOW (ref >60)
Glucose, Bld: 116 mg/dL — ABNORMAL HIGH (ref 70–99)
Potassium: 4.3 mmol/L (ref 3.5–5.1)
Sodium: 155 mmol/L — ABNORMAL HIGH (ref 135–145)
Total Bilirubin: 0.8 mg/dL (ref 0.3–1.2)
Total Protein: 5.9 g/dL — ABNORMAL LOW (ref 6.5–8.1)

## 2020-11-21 LAB — GLUCOSE, CAPILLARY
Glucose-Capillary: 100 mg/dL — ABNORMAL HIGH (ref 70–99)
Glucose-Capillary: 101 mg/dL — ABNORMAL HIGH (ref 70–99)
Glucose-Capillary: 101 mg/dL — ABNORMAL HIGH (ref 70–99)
Glucose-Capillary: 104 mg/dL — ABNORMAL HIGH (ref 70–99)
Glucose-Capillary: 111 mg/dL — ABNORMAL HIGH (ref 70–99)
Glucose-Capillary: 113 mg/dL — ABNORMAL HIGH (ref 70–99)

## 2020-11-21 LAB — CK: Total CK: 819 U/L — ABNORMAL HIGH (ref 49–397)

## 2020-11-21 LAB — SODIUM, URINE, RANDOM: Sodium, Ur: 106 mmol/L

## 2020-11-21 LAB — OSMOLALITY, URINE: Osmolality, Ur: 403 mOsm/kg (ref 300–900)

## 2020-11-21 LAB — OSMOLALITY: Osmolality: 342 mOsm/kg (ref 275–295)

## 2020-11-21 MED ORDER — CLONIDINE HCL 0.2 MG PO TABS
0.3000 mg | ORAL_TABLET | Freq: Four times a day (QID) | ORAL | Status: DC
  Administered 2020-11-21 – 2020-11-22 (×3): 0.3 mg via ORAL
  Filled 2020-11-21 (×3): qty 1

## 2020-11-21 MED ORDER — QUETIAPINE FUMARATE 25 MG PO TABS
25.0000 mg | ORAL_TABLET | Freq: Two times a day (BID) | ORAL | Status: DC
  Administered 2020-11-21 – 2020-11-22 (×2): 25 mg via ORAL
  Filled 2020-11-21 (×2): qty 1

## 2020-11-21 MED ORDER — QUETIAPINE FUMARATE 25 MG PO TABS: 25.0000 mg | ORAL_TABLET | Freq: Two times a day (BID) | ORAL | Status: DC

## 2020-11-21 MED ORDER — SODIUM CHLORIDE 0.45 % IV SOLN: INTRAVENOUS | Status: DC

## 2020-11-21 MED ORDER — ORAL CARE MOUTH RINSE
15.0000 mL | Freq: Two times a day (BID) | OROMUCOSAL | Status: DC
  Administered 2020-11-22 – 2020-12-03 (×17): 15 mL via OROMUCOSAL

## 2020-11-21 MED ORDER — LORAZEPAM 0.5 MG PO TABS
0.5000 mg | ORAL_TABLET | Freq: Two times a day (BID) | ORAL | Status: DC
  Administered 2020-11-21 – 2020-11-22 (×2): 0.5 mg via ORAL
  Filled 2020-11-21 (×2): qty 1

## 2020-11-21 MED ORDER — CLONIDINE HCL 0.2 MG PO TABS: 0.3000 mg | ORAL_TABLET | Freq: Four times a day (QID) | ORAL | Status: DC

## 2020-11-21 MED ORDER — DOCUSATE SODIUM 100 MG PO CAPS
100.0000 mg | ORAL_CAPSULE | Freq: Two times a day (BID) | ORAL | Status: DC
  Administered 2020-11-22 – 2020-12-07 (×26): 100 mg via ORAL
  Filled 2020-11-21 (×31): qty 1

## 2020-11-21 MED ORDER — LORAZEPAM 0.5 MG PO TABS: 0.5000 mg | ORAL_TABLET | Freq: Two times a day (BID) | ORAL | Status: DC

## 2020-11-21 MED ORDER — FREE WATER
200.0000 mL | Status: DC
  Administered 2020-11-21 (×2): 200 mL

## 2020-11-21 MED ORDER — MUPIROCIN CALCIUM 2 % EX CREA
TOPICAL_CREAM | Freq: Every day | CUTANEOUS | Status: DC
  Administered 2020-12-02: 1 via TOPICAL
  Filled 2020-11-21 (×2): qty 15

## 2020-11-21 MED ORDER — HALOPERIDOL LACTATE 5 MG/ML IJ SOLN
1.0000 mg | Freq: Four times a day (QID) | INTRAMUSCULAR | Status: DC | PRN
  Administered 2020-11-21 – 2020-11-23 (×5): 1 mg via INTRAVENOUS
  Filled 2020-11-21 (×4): qty 1

## 2020-11-21 MED ORDER — THIAMINE HCL 100 MG PO TABS
100.0000 mg | ORAL_TABLET | Freq: Every day | ORAL | Status: DC
  Administered 2020-11-22 – 2020-12-07 (×17): 100 mg via ORAL
  Filled 2020-11-21 (×18): qty 1

## 2020-11-21 MED ORDER — OXYCODONE HCL 5 MG PO TABS
5.0000 mg | ORAL_TABLET | Freq: Three times a day (TID) | ORAL | Status: DC
Start: 2020-11-21 — End: 2020-11-26
  Administered 2020-11-21 – 2020-11-25 (×13): 5 mg via ORAL
  Filled 2020-11-21 (×14): qty 1

## 2020-11-21 MED ORDER — FOLIC ACID 1 MG PO TABS
1.0000 mg | ORAL_TABLET | Freq: Every day | ORAL | Status: DC
  Administered 2020-11-22 – 2020-12-07 (×16): 1 mg via ORAL
  Filled 2020-11-21 (×17): qty 1

## 2020-11-21 MED ORDER — ADULT MULTIVITAMIN W/MINERALS CH
1.0000 | ORAL_TABLET | Freq: Every day | ORAL | Status: DC
  Administered 2020-11-22 – 2020-12-07 (×16): 1 via ORAL
  Filled 2020-11-21 (×17): qty 1

## 2020-11-21 MED ORDER — PANTOPRAZOLE SODIUM 40 MG PO TBEC
40.0000 mg | DELAYED_RELEASE_TABLET | Freq: Every day | ORAL | Status: DC
  Administered 2020-11-22: 40 mg via ORAL
  Filled 2020-11-21: qty 1

## 2020-11-21 MED ORDER — POLYETHYLENE GLYCOL 3350 17 G PO PACK
17.0000 g | PACK | Freq: Every day | ORAL | Status: DC
  Administered 2020-11-22 – 2020-12-06 (×8): 17 g via ORAL
  Filled 2020-11-21 (×16): qty 1

## 2020-11-21 NOTE — Progress Notes (Signed)
NAME:  Nathaniel Lynn, MRN:  [id number], DOB:  1991-01-20, LOS: 11 ADMISSION DATE:  11/10/2020, CONSULTATION DATE:  11/11/2020 REFERRING MD:  Derrill Kay - ED, CHIEF COMPLAINT: Hypoxia  History of Present Illness:  30 year old male with PMHx significant for narcotic drug abuse; recently released from prison 2 weeks ago after serving a 2-year sentence. Sister/probation officer attempted contact 1000 date of admission, unable to reach patient. Found down at girlfriend's house around 1500; per girlfriend reportedly "getting high" but unresponsive to Narcan administration. EMS contacted and patient taken to [facility] for evaluation.   Labs notable for +UDS (amphetamines, opiates, cannabinoids), Cr 2.33 (baseline 0.8-1.2), CK level > 50K, LA 3.0. CT Head was negative for acute pathology. While in the ED, the patient was given Ativan x 2 for tonic-clonic seizure-like activity and was intubated shortly after seizure-like activity was noted. Concern for "posturing". Transferred to [facility] for further evaluation and continuous EEG monitoring.  Pertinent Medical History:  Drug abuse, ?kidney dysfunction  Significant Hospital Events: Including procedures, antibiotic start and stop dates in addition to other pertinent events   . 5/14 admitted, intubated, transferred to [facility]. Started on empiric abx - vanc, cefepime, flagyl, acyclovir . 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 . 5/19 - Lumbar puncture was uneventful . 5/20 - Antibiotics discontinued following reassuring LP.  HSV pending. . 5/21 - HSV neg, acyclovir stopped, slowly improving renal function, remains on propofol but no purposeful movement, EEG neg, repeat CTH neg. Levaquin started for +TA (stenotrophomonas/flavobacterium) . 5/23 - Off sedation since 5/22AM, following commands consistently.  Appears to be purposefully reaching for ETT with LUE. Marland Kitchen 5/24 - Opens eyes, does not follow commands today. Continued L-sided purposeful movement. EEG demonstrating moderate diffuse encephalopathy (NOS), no seizures. MRI Brain today per Neuro. . 5/25 Agitated overnight and started on Precedex, MRI consistent with hypoxic ischemic injury, however awake and following commands this AM  Interim History / Subjective:  LTM EEG without epileptiform changes Agitated overnight, awake and following commands on Precedex UOP 5L yesterday  Objective   Blood pressure (!) 154/88, pulse 99, temperature 99.1 F (37.3 C), temperature source Oral, resp. rate 18, weight 112.9 kg, SpO2 99 %.    Vent Mode: PSV;CPAP FiO2 (%):  [40 %] 40 % Set Rate:  [16 bmp] 16 bmp Vt Set:  [580 mL] 580 mL PEEP:  [8 cmH20] 8 cmH20 Pressure Support:  [5 cmH20] 5 cmH20 Plateau Pressure:  [11 cmH20-22 cmH20] 14 cmH20   Intake/Output Summary (Last 24 hours) at 11/21/2020 0840 Last data filed at 11/21/2020 3790 Gross per 24 hour  Intake 1958.58 ml  Output 5100 ml  Net -3141.42 ml   Filed Weights   11/16/20 0400 11/17/20 0400 11/18/20 0400  Weight: 111.6 kg 114.9 kg 112.9 kg   General:   Critically-ill appearing, intubated M in no acute distress HEENT: MM pink/moist, ETT in place sclera anicteric Neuro: opens eyes to voice, nods to questions, moving all extremities to command CV: s1s2 rrr, no m/r/g PULM:  Bronchial breath sounds in bilateral bases on PSV with good TV >600cc and no tachypnea GI: soft, bsx4 active  Extremities: warm/dry, RLE with 2+ edema  Skin: healed linear scars on extremeties   Labs/imaging that I have personally reviewed: (right click and "Reselect all SmartList Selections" daily)    Na-155 Chl-121 Creatinine 2.06 ALT 130, AST 166   Resolved  Hospital Problem List   Lactic acidosis Electrolyte imbalance Elevated troponin, likely due to rhabdo  Assessment & Plan:   Toxic/ metabolic  encephalopathy with 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 CSF Cx unremarkable, neg for viral or bacterial process, HSV neg 5/21, antimicrobials discontinued. EEG negative. CT Head 5/14, 5/21 negative for acute process. MRI Brain 5/16 c/f encephalopathy (hypoxic ischemic vs. uremic vs. other toxic/metabolic etiology). P: -waxing and waning mental status, though initially following commands this AM -appreciate neuro recs, continue LTM EEG, diffuse encephalopathy thus far -continue precedex and start down-titrating Ativan and Seroquel.  He was agitated overnight, so Clonidine increased to q6hrs -continue supportive care and monitoring neurologic recovery     Acute hypoxic respiratory failure due to encephalopathy, impaired airway protection and  Stenotrophomonas and flavobacterium indologenes tracheobronchitis  Down to 40% FiO2 P: -Maintain full vent support with SAT/SBT as tolerated -titrate Vent setting to maintain SpO2 greater than or equal to 90%. -HOB elevated 30 degrees. -Plateau pressures less than 30 cm H20.  -Follow chest x-ray, ABG prn.   -Bronchial hygiene and RT/bronchodilator protocol. -continue Precedex, Oxycodone  - Levaquin Day 4 of 7 -follow respiratory cultures and fever curve     Hypertension Possibly secondary to drug withdrawal P: -continue prn Hydralazine and Labetalol for SBP goal <160 -20L+ since admission, but auto-diuresing aggressively     AKI due to rhabdomyolysis Hypervolemia Possibly in post-ATN diuretic phase  P: -urine osms and urine Na pending -creatinine improving, 5L UOP yesterday -continue to avoid nephrotoxins, monitor electrolytes and repeat BMP -avoid nephrotoxins as able   Hypernatremia Na uptrending to 155 despite free water 20L+ since admission with large volume urine output P: -increase free water flushes to 200cc  q2hrs   LUE superficial thrombosis, RLE Edema -check LE dopplers to ensure no DVT -continue prophylactic Lovenox   Anemia (normocytic, normochromic) - Trend CBC - Tranfuse for Hgb < 7  Elevated transaminases Possibly secondary to shock vs toxins P: -slightly down-trending today, continue to follow  IVDU HIV checked, nonreactive. No evidence of endocarditis on TTE - Substance abuse counseling once able to participate in care  Low albumin Moderate Protein calorie malnutrition - Continue TFs  Best practice (right click and "Reselect all SmartList Selections" daily)  Diet:  Tube Feed  Pain/Anxiety/Delirium protocol (if indicated): Yes (RASS goal +1) VAP protocol (if indicated): Yes DVT prophylaxis: LMWH GI prophylaxis: PPI Glucose control:  SSI Yes Central venous access:  N/A, PICC - LUE site ok Arterial line:  N/A Foley:  N/A Mobility:  bed rest  PT consulted: N/A Last date of multidisciplinary goals of care discussion [pending 5/25 ] Code Status:  full code Disposition: ICU  Family: Spoke with patient's mother, plan to come in for family meeting at 5:15 today  Critical care time: 40 minutes   CRITICAL CARE Performed by: Darcella Gasman Gianne Shugars   Total critical care time: 40 minutes  Critical care time was exclusive of separately billable procedures and treating other patients.  Critical care was necessary to treat or prevent imminent or life-threatening deterioration.  Critical care was time spent personally by me on the following activities: development of treatment plan with patient and/or surrogate as well as nursing, discussions with consultants, evaluation of patient's response to treatment, examination of patient, obtaining history from patient or surrogate, ordering and performing treatments and interventions, ordering and review of laboratory studies, ordering and review of radiographic studies, pulse oximetry  and re-evaluation of patient's condition.   Darcella Gasman  Nathaniel Lienhard, PA-C [facility] See Amion for pager If no response to pager , please call [phone] until 7pm After 7:00 pm call Elink  [phone]

## 2020-11-21 NOTE — Consult Note (Addendum)
WOC Nurse Consult Note: Reason for Consult: Consult requested for left ear.  Pt has developed a hospital-acquired pressure injury related to a medical device: from previous tube holder which is no longer in place, according to the bedside nurse. 20% black and dry to wound edges, 80% red.  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  Change foam dressing Q 3 days or PRN soiling.  WOC team will reassess the location weekly to determine if a change in the plan of care is indicated at that time.  Cammie Mcgee MSN, RN, CWOCN, [place], CNS [phone]

## 2020-11-21 NOTE — Progress Notes (Signed)
eLink Physician-Brief Progress Note Patient Name: Nathaniel Lynn DOB: 02-Nov-1990 MRN: [id number]   Date of Service  11/21/2020  HPI/Events of Note  Multiple issues: 1. Agitation - Nursing request to renew restraint orders. No restraint orders found. 2. Nursing request to D/C free water per tube since patient no longer has OGT and is drinking. Last Na++ = 155.  eICU Interventions  Plan: 1. Bilateral soft wrist restraints X 11 hours. 2. D/C free water per tube. 3. 0.45 NaCl IV infusion to run at 50 mL/hour. 4. Encourage patient to drink water.      Intervention Category Major Interventions: Delirium, psychosis, severe agitation - evaluation and management  Elisa Sorlie Eugene 11/21/2020, 10:12 PM

## 2020-11-21 NOTE — Progress Notes (Signed)
Subjective: No significant change  Exam: Vitals:   11/21/20 1147 11/21/20 1153  BP:    Pulse: 87   Resp: 15   Temp:  98.9 F (37.2 C)  SpO2: 99%    Gen: In bed, intubated Resp: non-labored breathing, no acute distress Abd: soft, nt  Neuro: MS: Eyes are open, he does not follow commands CN: He does blink to threat bilaterally, he does not look to the right as much as the left, but crosses with dolls maneuver. Motor: withdraws in bilateral UE, and left LL, minimal flexion vs withdrawal in the RLE.  Sensory: As above  MRI brain- improving lesions associated with hypoxic brain injury  Impression: 30 year old male with unresponsiveness in the setting of amphetamine use.  He appears to be making some progress, and I suspect his exam at this point is simply a sequela of his hypoxic brain injury.  He does have a significant degree of consciousness, so this early that is a good prognostic sign.  I think he has a significant chance of a good recovery but that it will be quite prolonged.  Recommendations: 1) at this point, care will be purely supportive.  Neurology will be available on an as-needed basis, please do not hesitate to call with any further questions.  Ritta Slot, MD [facility] [phone]  If 7pm- 7am, please page neurology on call as listed in AMION. 11/21/2020  12:39 PM

## 2020-11-21 NOTE — Progress Notes (Signed)
BLE venous duplex has been completed.  Results can be found under chart review under CV PROC. 11/21/2020 2:04 PM Tiffane Sheldon RVT, RDMS

## 2020-11-21 NOTE — Procedures (Signed)
Extubation Procedure Note  Patient Details:   Name: Nathaniel Lynn DOB: 10-May-1991 MRN: [id number]   Airway Documentation:    Vent end date: 11/21/20 Vent end time: 1345   Evaluation  O2 sats: stable throughout Complications: No apparent complications Patient did tolerate procedure well. Bilateral Breath Sounds: Clear,Diminished   Pt extubated to 4L [place] per MD order. Pt had positive cuff leak prior to extubation. No stridor noted.   Guss Bunde 11/21/2020, 1:46 PM

## 2020-11-22 LAB — CBC
HCT: 28.6 % — ABNORMAL LOW (ref 39.0–52.0)
Hemoglobin: 8.8 g/dL — ABNORMAL LOW (ref 13.0–17.0)
MCH: 30.6 pg (ref 26.0–34.0)
MCHC: 30.8 g/dL (ref 30.0–36.0)
MCV: 99.3 fL (ref 80.0–100.0)
Platelets: 418 10*3/uL — ABNORMAL HIGH (ref 150–400)
RBC: 2.88 MIL/uL — ABNORMAL LOW (ref 4.22–5.81)
RDW: 12.5 % (ref 11.5–15.5)
WBC: 9.3 10*3/uL (ref 4.0–10.5)
nRBC: 0 % (ref 0.0–0.2)

## 2020-11-22 LAB — GLUCOSE, CAPILLARY
Glucose-Capillary: 100 mg/dL — ABNORMAL HIGH (ref 70–99)
Glucose-Capillary: 101 mg/dL — ABNORMAL HIGH (ref 70–99)
Glucose-Capillary: 102 mg/dL — ABNORMAL HIGH (ref 70–99)
Glucose-Capillary: 105 mg/dL — ABNORMAL HIGH (ref 70–99)
Glucose-Capillary: 106 mg/dL — ABNORMAL HIGH (ref 70–99)
Glucose-Capillary: 97 mg/dL (ref 70–99)

## 2020-11-22 LAB — BASIC METABOLIC PANEL
Anion gap: 6 (ref 5–15)
BUN: 48 mg/dL — ABNORMAL HIGH (ref 6–20)
CO2: 25 mmol/L (ref 22–32)
Calcium: 8.3 mg/dL — ABNORMAL LOW (ref 8.9–10.3)
Chloride: 118 mmol/L — ABNORMAL HIGH (ref 98–111)
Creatinine, Ser: 1.84 mg/dL — ABNORMAL HIGH (ref 0.61–1.24)
GFR, Estimated: 51 mL/min — ABNORMAL LOW (ref >60)
Glucose, Bld: 105 mg/dL — ABNORMAL HIGH (ref 70–99)
Potassium: 3.7 mmol/L (ref 3.5–5.1)
Sodium: 149 mmol/L — ABNORMAL HIGH (ref 135–145)

## 2020-11-22 LAB — MAGNESIUM: Magnesium: 2.4 mg/dL (ref 1.7–2.4)

## 2020-11-22 LAB — PHOSPHORUS: Phosphorus: 4.4 mg/dL (ref 2.5–4.6)

## 2020-11-22 MED ORDER — CLONIDINE HCL 0.1 MG PO TABS
0.4000 mg | ORAL_TABLET | Freq: Four times a day (QID) | ORAL | Status: AC
  Administered 2020-11-22 – 2020-11-26 (×18): 0.4 mg via ORAL
  Filled 2020-11-22 (×4): qty 2
  Filled 2020-11-22: qty 4
  Filled 2020-11-22: qty 2
  Filled 2020-11-22 (×2): qty 4
  Filled 2020-11-22: qty 2
  Filled 2020-11-22: qty 4
  Filled 2020-11-22: qty 2
  Filled 2020-11-22: qty 4
  Filled 2020-11-22: qty 2
  Filled 2020-11-22: qty 4
  Filled 2020-11-22 (×5): qty 2

## 2020-11-22 MED ORDER — LORAZEPAM 1 MG PO TABS
1.0000 mg | ORAL_TABLET | ORAL | Status: DC | PRN
  Administered 2020-11-22 – 2020-11-23 (×4): 1 mg via ORAL
  Filled 2020-11-22 (×5): qty 1

## 2020-11-22 MED ORDER — HALOPERIDOL LACTATE 5 MG/ML IJ SOLN
1.0000 mg | Freq: Once | INTRAMUSCULAR | Status: AC
  Administered 2020-11-22: 1 mg via INTRAVENOUS
  Filled 2020-11-22: qty 1

## 2020-11-22 MED ORDER — HEPARIN SODIUM (PORCINE) 5000 UNIT/ML IJ SOLN
5000.0000 [IU] | Freq: Three times a day (TID) | INTRAMUSCULAR | Status: DC
  Administered 2020-11-22 – 2020-11-27 (×16): 5000 [IU] via SUBCUTANEOUS
  Filled 2020-11-22 (×16): qty 1

## 2020-11-22 MED ORDER — ENSURE ENLIVE PO LIQD
237.0000 mL | Freq: Three times a day (TID) | ORAL | Status: DC
  Administered 2020-11-22 – 2020-11-27 (×9): 237 mL via ORAL

## 2020-11-22 MED ORDER — QUETIAPINE FUMARATE 50 MG PO TABS
50.0000 mg | ORAL_TABLET | Freq: Two times a day (BID) | ORAL | Status: DC
  Administered 2020-11-22 – 2020-11-25 (×7): 50 mg via ORAL
  Filled 2020-11-22 (×9): qty 1

## 2020-11-22 NOTE — Progress Notes (Signed)
NAME:  Nathaniel Lynn, MRN:  [id number], DOB:  August 14, 1990, LOS: 12 ADMISSION DATE:  11/10/2020, CONSULTATION DATE:  11/11/2020 REFERRING MD:  Archie Balboa - ED, CHIEF COMPLAINT: Hypoxia  History of Present Illness:  30 year old male with PMHx significant for narcotic drug abuse; recently released from prison 2 weeks ago after serving a 2-year sentence. Sister/probation officer attempted contact 1000 date of admission, unable to reach patient. Found down at girlfriend's house around 1500; per girlfriend reportedly "getting high" but unresponsive to Narcan administration. EMS contacted and patient taken to [facility] for evaluation.   Labs notable for +UDS (amphetamines, opiates, cannabinoids), Cr 2.33 (baseline 0.8-1.2), CK level > 50K, LA 3.0. CT Head was negative for acute pathology. While in the ED, the patient was given Ativan x 2 for tonic-clonic seizure-like activity and was intubated shortly after seizure-like activity was noted. Concern for "posturing". Transferred to [facility] for further evaluation and continuous EEG monitoring.  Pertinent Medical History:  Drug abuse, ?kidney dysfunction  Significant Hospital Events: Including procedures, antibiotic start and stop dates in addition to other pertinent events   . 5/14 admitted, intubated, transferred to [facility]. Started on empiric abx - vanc, cefepime, flagyl, acyclovir . 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 . 5/19 - Lumbar puncture was uneventful . 5/20 - Antibiotics discontinued following reassuring LP.  HSV pending. . 5/21 - HSV neg, acyclovir stopped, slowly improving renal function, remains on propofol but no purposeful movement, EEG neg, repeat CTH neg. Levaquin started for +TA (stenotrophomonas/flavobacterium) . 5/23 - Off sedation since 5/22AM, following commands consistently.  Appears to be purposefully reaching for ETT with LUE. Marland Kitchen 5/24 - Opens eyes, does not follow commands today. Continued L-sided purposeful movement. EEG demonstrating moderate diffuse encephalopathy (NOS), no seizures. MRI Brain today per Neuro. . 5/25 Agitated overnight and started on Precedex, MRI consistent with hypoxic ischemic injury, however awake and following commands this AM.  Extubated on Precedex . 5/26 doing well from respiratory standpoint post-extubation, remains agitated on Precedex  Interim History / Subjective:  Given Haldol overnight Now drinking po IVF, Na improved to 149 >5L UOP yesterday  Objective   Blood pressure 133/82, pulse 64, temperature 98 F (36.7 C), temperature source Oral, resp. rate 16, weight 112.9 kg, SpO2 100 %.    Vent Mode: PSV;CPAP FiO2 (%):  [40 %] 40 % PEEP:  [8 cmH20] 8 cmH20 Pressure Support:  [5 cmH20] 5 cmH20   Intake/Output Summary (Last 24 hours) at 11/22/2020 1022 Last data filed at 11/22/2020 0700 Gross per 24 hour  Intake 3313.74 ml  Output 4750 ml  Net -1436.26 ml   Filed Weights   11/16/20 0400 11/17/20 0400 11/18/20 0400  Weight: 111.6 kg 114.9 kg 112.9 kg   General:  Mildly anxious-appearing M, awake and in no distress  HEENT: MM pink/moist, R conjunctiva injected, bandaged L ear Neuro: awake, following commands, decreased strength RLE though moving all extremities, answering questions though voice hoarse and difficult to understand CV: s1s2 rrr, no m/r/g PULM:  Bronchial breath sounds bilaterally, no distress, no accessory muscle or retractions GI: soft, bsx4 active  Extremities: warm/dry, 3+ RLE  Edema, no erythema with good pulses and soft compartments Skin: no rashes or lesions   Labs/imaging that I have personally reviewed: (right click and "Reselect all SmartList Selections" daily)    Na-149 Chl-118 Glu-105 Creat 1.8   Resolved Hospital Problem  List   Lactic acidosis Electrolyte imbalance Elevated troponin,  likely due to rhabdo  Assessment & Plan:   Toxic/ metabolic encephalopathy with 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 CSF Cx unremarkable, neg for viral or bacterial process, HSV neg 5/21, antimicrobials discontinued. EEG negative. CT Head 5/14, 5/21 negative for acute process. MRI Brain 5/16 c/f encephalopathy (hypoxic ischemic vs. uremic vs. other toxic/metabolic etiology). P: -successfully extubated to nasal cannula yesterday, continue supplemental O2 to maintain sats >92% -intermittently agitated requiring Precedex, increase seroquel and ativan and attempt to decrease. Precedex and facilitate ICU liberation.  Continue scheduled Clonidine and prn Haldol -passed bedside swallow, start soft diet -PT/OT consults    Acute hypoxic respiratory failure due to encephalopathy, impaired airway protection and  Stenotrophomonas and flavobacterium indologenes tracheobronchitis  Down to 40% FiO2 P: -continue Levaquin day 5 of 7  -supplemental  O2  -start incentive spirometer -prn bronchodilators     Hypertension Possibly secondary to drug withdrawal P: -continue prn Hydralazine and Labetalol for SBP goal <160 -20L+ since admission, but auto-diuresing aggressively     AKI due to rhabdomyolysis Hypervolemia Possibly in post-ATN diuretic phase  5L UOP again yesterday P: -urine osms 403, urine Na 106, maybe in post-ATN diuretic phase and secondary to elevated CK, with improving mental status hold DDAVP\ -creatinine continues to improve -continue to avoid nephrotoxins, monitor electrolytes and repeat BMP -avoid nephrotoxins as able   Hypernatremia Improving, Na 149 P: -extubated and taking po's, encourage fluid intake and continue to trend  LUE superficial thrombosis, RLE Edema LE dopplers negative for DVT Continue prophylactic heparin  Anemia (normocytic,  normochromic) Stable P: - Trend CBC - Tranfuse for Hgb < 7  Elevated transaminases Possibly secondary to shock vs toxins P: -down-trending  IVDU HIV checked, nonreactive. No evidence of endocarditis on TTE - Substance abuse counseling once able to participate in care  Low albumin Moderate Protein calorie malnutrition -start diet today  Best practice (right click and "Reselect all SmartList Selections" daily)  Diet:  Oral Pain/Anxiety/Delirium protocol (if indicated): Yes (RASS goal +2)  Precedex VAP protocol (if indicated): Not indicated DVT prophylaxis: Subcutaneous Heparin GI prophylaxis: PPI Glucose control:  SSI Yes Central venous access:  Yes, and it is still needed, PICC - LUE site ok Arterial line:  N/A Foley:  N/A Mobility:  OOB  PT consulted: Yes Last date of multidisciplinary goals of care discussion [pending 5/25 ] Code Status:  full code Disposition: ICU  Family: met with Patient's mother and father at the bedside yesterday evening and questions answered  Critical care time: 35 minutes   CRITICAL CARE Performed by: Otilio Carpen Byrne Capek   Total critical care time: 35 minutes  Critical care time was exclusive of separately billable procedures and treating other patients.  Critical care was necessary to treat or prevent imminent or life-threatening deterioration.  Critical care was time spent personally by me on the following activities: development of treatment plan with patient and/or surrogate as well as nursing, discussions with consultants, evaluation of patient's response to treatment, examination of patient, obtaining history from patient or surrogate, ordering and performing treatments and interventions, ordering and review of laboratory studies, ordering and review of radiographic studies, pulse oximetry and re-evaluation of patient's condition.   Otilio Carpen Georgi Tuel, PA-C [facility] See Amion for pager If no response to pager ,  please call [phone] until 7pm After 7:00 pm call Elink  [phone]  

## 2020-11-22 NOTE — Progress Notes (Signed)
eLink Physician-Brief Progress Note Patient Name: Nathaniel Lynn DOB: [birth year] MRN: [id number]   Date of Service  11/22/2020  HPI/Events of Note  Patient pulling off his medical devices and yelling and screaming despite max dose Precedex and PRN Ativan, bedside RN is concerned about patient hurting himself or disrupting his care.  eICU Interventions  Bilateral wrist restraints renewed.        Nathaniel Lynn 11/22/2020, 9:30 PM

## 2020-11-22 NOTE — Progress Notes (Signed)
eLink Physician-Brief Progress Note Patient Name: Nathaniel Lynn DOB: 1990-07-25 MRN: [id number]   Date of Service  11/22/2020  HPI/Events of Note  Agitated Delirium - Patient yelling out in room. QTc interval = 0.47 seconds.   eICU Interventions  Plan: 1. Haldol 1 mg IV now. Extra dose.     Intervention Category Major Interventions: Delirium, psychosis, severe agitation - evaluation and management  Jonella Redditt Eugene 11/22/2020, 6:48 AM

## 2020-11-22 NOTE — Progress Notes (Signed)
Report called to [facility] 2H08. Patient resting comfortably in the bed at this time. Will transfer to new room.

## 2020-11-22 NOTE — Progress Notes (Signed)
OT Cancellation Note  Patient Details Name: Nathaniel Lynn MRN: [id number] DOB: May 26, 1991   Cancelled Treatment:    Reason Eval/Treat Not Completed: Medical issues which prohibited therapy.  Pt sedated due to agitation.  Will reattempt.  Eber Jones., OTR/L Acute Rehabilitation Services Pager [phone] Office [phone]   Jeani Hawking M 11/22/2020, 4:55 PM

## 2020-11-22 NOTE — Progress Notes (Signed)
Nutrition Follow-up  DOCUMENTATION CODES:   Not applicable  INTERVENTION:   Ensure Enlive po TID, each supplement provides 350 kcal and 20 grams of protein  Magic cup TID with meals, each supplement provides 290 kcal and 9 grams of protein  Encourage PO intake   NUTRITION DIAGNOSIS:   Inadequate oral intake related to acute illness as evidenced by meal completion < 50%. Ongoing.   GOAL:   Patient will meet greater than or equal to 90% of their needs Progressing.  MONITOR:   PO intake,Supplement acceptance  REASON FOR ASSESSMENT:   Consult Enteral/tube feeding initiation and management  ASSESSMENT:   Pt with PMH of polysubstance abuse recently released from prison admitted after being found down admitted with acute metabolic encephalopathy and AKI due to rhabdomyolysis.   Pt discussed during ICU rounds and with RN.  Per MD acute encephalopathy due to drug overdose vs herpes vs meningitis.  Pt remains on precedex post extubation Weight increased by 40 lb with notable deep pitting edema on BLE   5/25 pt extubated 5/26 diet advanced; no meals yet  Medications reviewed and include: colace, folic acid, SSI, MVI, miralax, thiamine  Precedex   Labs reviewed: Na 149, BUN: 48, Cr: 1.84    Diet Order:   Diet Order            DIET SOFT Room service appropriate? Yes; Fluid consistency: Thin  Diet effective now                 EDUCATION NEEDS:   No education needs have been identified at this time  Skin:  Skin Assessment: Skin Integrity Issues: Skin Integrity Issues:: Stage III Stage III: ear  Last BM:  5/25 type 7  Height:   Ht Readings from Last 1 Encounters:  11/10/20 5\' 10"  (1.778 m)    Weight:   Wt Readings from Last 1 Encounters:  11/18/20 112.9 kg    BMI:  Body mass index is 35.71 kg/m.  Estimated Nutritional Needs:   Kcal:  2300-2500  Protein:  120-135 grams  Fluid:  >2.3 L/day  11/20/20., RD, LDN, CNSC See AMiON for contact  information

## 2020-11-22 NOTE — Progress Notes (Addendum)
PT Cancellation Note  Patient Details Name: Nathaniel Lynn MRN: [id number] DOB: [birth year]   Cancelled Treatment:    Reason Eval/Treat Not Completed: Medical issues which prohibited therapy. Per OT note, pt sedated due to agitation. Will follow-up another date as appropriate.   Raymond Gurney, PT, DPT Acute Rehabilitation Services  Pager: [phone] Office: [phone]    Jewel Baize 11/22/2020, 5:28 PM

## 2020-11-23 LAB — BASIC METABOLIC PANEL
Anion gap: 9 (ref 5–15)
BUN: 48 mg/dL — ABNORMAL HIGH (ref 6–20)
CO2: 25 mmol/L (ref 22–32)
Calcium: 8.9 mg/dL (ref 8.9–10.3)
Chloride: 117 mmol/L — ABNORMAL HIGH (ref 98–111)
Creatinine, Ser: 1.85 mg/dL — ABNORMAL HIGH (ref 0.61–1.24)
GFR, Estimated: 50 mL/min — ABNORMAL LOW (ref >60)
Glucose, Bld: 109 mg/dL — ABNORMAL HIGH (ref 70–99)
Potassium: 3.6 mmol/L (ref 3.5–5.1)
Sodium: 151 mmol/L — ABNORMAL HIGH (ref 135–145)

## 2020-11-23 LAB — GLUCOSE, CAPILLARY
Glucose-Capillary: 102 mg/dL — ABNORMAL HIGH (ref 70–99)
Glucose-Capillary: 102 mg/dL — ABNORMAL HIGH (ref 70–99)
Glucose-Capillary: 105 mg/dL — ABNORMAL HIGH (ref 70–99)
Glucose-Capillary: 75 mg/dL (ref 70–99)
Glucose-Capillary: 80 mg/dL (ref 70–99)
Glucose-Capillary: 88 mg/dL (ref 70–99)

## 2020-11-23 MED ORDER — FENTANYL CITRATE (PF) 100 MCG/2ML IJ SOLN
25.0000 ug | INTRAMUSCULAR | Status: DC | PRN
  Administered 2020-11-23 – 2020-11-25 (×6): 50 ug via INTRAVENOUS
  Filled 2020-11-23 (×7): qty 2

## 2020-11-23 MED ORDER — POTASSIUM CHLORIDE 20 MEQ PO PACK
20.0000 meq | PACK | Freq: Once | ORAL | Status: AC
  Administered 2020-11-23: 20 meq via ORAL
  Filled 2020-11-23: qty 1

## 2020-11-23 MED ORDER — HALOPERIDOL LACTATE 5 MG/ML IJ SOLN
1.0000 mg | Freq: Four times a day (QID) | INTRAMUSCULAR | Status: DC | PRN
  Administered 2020-11-23: 2 mg via INTRAVENOUS
  Administered 2020-11-24 (×2): 3 mg via INTRAVENOUS
  Administered 2020-11-24: 2 mg via INTRAVENOUS
  Administered 2020-11-24 (×2): 3 mg via INTRAVENOUS
  Administered 2020-11-25: 2 mg via INTRAVENOUS
  Administered 2020-11-27 – 2020-11-29 (×2): 5 mg via INTRAVENOUS
  Filled 2020-11-23 (×8): qty 1

## 2020-11-23 NOTE — Progress Notes (Addendum)
NAME:  ROBEY MASSMANN, MRN:  [id number], DOB:  1991-06-02, LOS: 13 ADMISSION DATE:  11/10/2020, CONSULTATION DATE:  11/11/2020 REFERRING MD:  Derrill Kay - ED, CHIEF COMPLAINT: Hypoxia  History of Present Illness:  30 year old male with PMHx significant for narcotic drug abuse; recently released from prison 2 weeks ago after serving a 2-year sentence. Sister/probation officer attempted contact 1000 date of admission, unable to reach patient. Found down at girlfriend's house around 1500; per girlfriend reportedly "getting high" but unresponsive to Narcan administration. EMS contacted and patient taken to [facility] for evaluation.   Labs notable for +UDS (amphetamines, opiates, cannabinoids), Cr 2.33 (baseline 0.8-1.2), CK level > 50K, LA 3.0. CT Head was negative for acute pathology. While in the ED, the patient was given Ativan x 2 for tonic-clonic seizure-like activity and was intubated shortly after seizure-like activity was noted. Concern for "posturing". Transferred to [facility] for further evaluation and continuous EEG monitoring.  Pertinent Medical History:  Drug abuse, ?kidney dysfunction  Significant Hospital Events: Including procedures, antibiotic start and stop dates in addition to other pertinent events   . 5/14 admitted, intubated, transferred to [facility]. Started on empiric abx - vanc, cefepime, flagyl, acyclovir . 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 . 5/19 - Lumbar puncture was uneventful . 5/20 - Antibiotics discontinued following reassuring LP.  HSV pending. . 5/21 - HSV neg, acyclovir stopped, slowly improving renal function, remains on propofol but no purposeful movement, EEG neg, repeat CTH neg. Levaquin started for +TA (stenotrophomonas/flavobacterium) . 5/23 - Off sedation since 5/22AM, following commands consistently.  Appears to be purposefully reaching for ETT with LUE. Marland Kitchen 5/24 - Opens eyes, does not follow commands today. Continued L-sided purposeful movement. EEG demonstrating moderate diffuse encephalopathy (NOS), no seizures. MRI Brain today per Neuro. . 5/25 Agitated overnight and started on Precedex, MRI consistent with hypoxic ischemic injury, however awake and following commands this AM.  Extubated on Precedex . 5/26 doing well from respiratory standpoint post-extubation, remains agitated on Precedex; levaquin stopped . 5/27 Given Haldol overnight, now drinking po IVF, Na improved to 149, >5L UOP yesterday; increased clonidine and seroquel dosing   Interim History / Subjective:   Tmax 99.2 Haldol this morning for agitation, x 2 prn doses of ativan precedex down to 0.5 mcg/ kg/hr  Voided UOP 7.4L (up from 5.8 L yesterday) Remains -5.7L/ net +13L   Confused, asking for water Remains significant edematous on R  Objective   Blood pressure (!) 148/83, pulse (!) 106, temperature 99.2 F (37.3 C), resp. rate (!) 21, weight 112.9 kg, SpO2 94 %.        Intake/Output Summary (Last 24 hours) at 11/23/2020 0949 Last data filed at 11/23/2020 0900 Gross per 24 hour  Intake 1256.59 ml  Output 8200 ml  Net -6943.41 ml   Filed Weights   11/17/20 0400 11/18/20 0400 11/23/20 0419  Weight: 114.9 kg 112.9 kg 112.9 kg   General:  Ill appearing young male sitting in recliner in NAD HEENT: MM pink/dry, pupils 4/reactive, some R lateral subconjunctival hemorrhage, speech slurred Neuro: Awake but drowsy, oriented to name, follows commands, able to squeeze both hands equally but not able to hold either to gravity, wiggles LE> RE, no movement to gravity, no focal deficits, generalized weakness CV: NSR, no murmur PULM:  On 2L [place], clear/ diminished, shallow GI: soft, bs+, condom cath Extremities: warm/dry, significant  R sided edema in RUE +3/ RLE +3-4, some generalized in L, +bilateral Dps  Skin: no rashes, old  scars to both forearms  Labs/imaging that I have personally reviewed: (right click and "Reselect all SmartList Selections" daily)    BMET, Na 149-> 151, sCr stable 1.85  5/25 serum osm 342, urine osm 403, uNa 106 MRI 5/24  Resolved Hospital Problem List   Lactic acidosis Electrolyte imbalance Elevated troponin, likely due to rhabdo  Assessment & Plan:   Toxic/ metabolic encephalopathy with 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 CSF Cx unremarkable, neg for viral or bacterial process, HSV neg 5/21, antimicrobials discontinued. EEG negative. CT Head 5/14, 5/21 negative for acute process. MRI Brain 5/16 c/f encephalopathy (hypoxic ischemic vs. uremic vs. other toxic/metabolic etiology). - repeat MRI 5/24 (motion degraded study) c/w expected evolution of hypoxic ischemic injury, no new or progressive findings P: - continue to wean precedex as able w/ clonidine 0.4mg  q 6hr - Seroquel (increased 5/26) 50 mg BID  - oxy IR 5mg  TID w/ scheduled bowel regimen - ativan prn q 4hr  - haldol prn  - empiric MVI, thiamine, folate - ongoing PT/ OT/ SLP   Acute hypoxic respiratory failure due to encephalopathy, impaired airway protection and  Stenotrophomonas and flavobacterium indologenes tracheobronchitis  - extubated 5/25 P: - continue Levaquin for 10 days, stop date 5/31 - supplemental  O2 prn  - aggressive pulm hygiene /IS - prn BD  Hypertension Possibly secondary to drug withdrawal P: - controlled - prn labetalol/ hydralazine for SBP < 160 - ongoing clonidine as above - auto- diuresing  AKI due to rhabdomyolysis Hypervolemia Hypernatremia  Possibly in post-ATN diuretic phase  5L UOP again yesterday P: -4/25urine osms 403, urine Na 106, maybe in post-ATN diuretic phase and secondary to elevated CK, with improving mental status hold DDAVP, continue to allow auto-duress  - 1500  ml fluid restristion -CK slowly clearing  - trend renal indices, monitor UOP -continue to avoid nephrotoxins, monitor electrolytes - consider renal consult   LUE superficial thrombosis, RUE/ RLE Edema - ?was he laying on his right side w/ rhabdo which could explain swelling w/ neg dopplers - LE/ UE dopplers negative for DVT - Continue prophylactic heparin - Auto-diuresing   Anemia (normocytic, normochromic) Stable P: - Trend CBC, stable slow drift from admit - Tranfuse for Hgb < 7  Elevated transaminases Possibly secondary to shock vs toxins P: -down-trending, continue to trend   IVDU HIV checked, nonreactive. No evidence of endocarditis on TTE - Substance abuse counseling once able to participate in care  Low albumin Moderate Protein calorie malnutrition -diet per SLP  Best practice (right click and "Reselect all SmartList Selections" daily)  Diet:  Oral Pain/Anxiety/Delirium protocol (if indicated): ciwa VAP protocol (if indicated): Not indicated DVT prophylaxis: Subcutaneous Heparin GI prophylaxis: PPI Glucose control:  SSI Yes Central venous access:  Yes, and it is still needed, PICC - LUE site ok Arterial line:  N/A Foley:  N/A Mobility:  OOB  PT consulted: Yes Last date of multidisciplinary goals of care discussion [pending 5/25 ] Code Status:  full code Disposition: ICU, if remains off precedex, will consider transfer to PCU and TRH as of 5/28  Pending family  update 5/27      6/27, ACNP [facility] 11/23/2020, 11:33 AM   Pulmonary critical care attending:  This is a 30 year old gentleman, past medical history of  narcotic abuse, recently released from prison after serving a 2-year sentence.  Patient was found down at home.  Urine drug screen positive for amphetamines opiates and cannabis.  Had acute renal failure and a CK level greater than 50,000.  Treated for rhabdomyolysis.  Patient had seizure-like activity and  posturing post.  Had MRI with mild hypoxic anoxic injury.  Has been significantly agitated but now extubated.  Patient was a lateral transfer from 4 N. to 2 heart yesterday.  BP (!) 162/93   Pulse (!) 118   Temp 98.5 F (36.9 C)   Resp 16   Wt 112.9 kg   SpO2 93%   BMI 35.71 kg/m   General: Young male resting in chair on room air HEENT: Tracking appropriately Skin: Multiple tattoos Extremities: Significant 3+ edema, dependent edema Heart: Regular rhythm S1-S2 lungs: Diminished bilaterally no crackles no wheeze  Labs: Reviewed Sodium 151 Serum creatinine 1.85  Significant positive cumulative fluid balance, was 20+ liters now 12-1/2 L Urine output at 7.4 L yesterday.  Assessment: Acute toxic metabolic encephalopathy Mild hypoxic ischemic insult Polysubstance abuse Drug overdose Acute hypoxemic respiratory failure secondary to encephalopathy and above, resolved Acute renal failure secondary to rhabdomyolysis Now hypervolemia, positive cumulative balance  Plan: Off Precedex Continue Seroquel Continue oxycodone As needed Haldol Continue multivitamin thiamine folate Continue PT OT and SLP support. Complete course of Levaquin, stop date 11/27/2020 Appears to be auto diuresing presumed related to polyuria from post ATN. Continue to follow urine output. Holding diuresis  Josephine Igo, DO [facility] 11/23/2020 2:18 PM

## 2020-11-23 NOTE — Progress Notes (Signed)
Inpatient Rehab Admissions Coordinator Note:   Per therapy recommendations, pt was screened for CIR candidacy by Anaija Wissink, MS CCC-SLP. At this time, Pt. Appears to have functional decline and is a good candidate for CIR. Will request order for rehab consult per protocol.  Please contact me with questions.   Marcellis Frampton, MS, CCC-SLP Rehab Admissions Coordinator  [phone] (celll) [phone] (office)  

## 2020-11-23 NOTE — Evaluation (Addendum)
Physical Therapy Evaluation Patient Details Name: Nathaniel Lynn MRN: [id number] DOB: August 07, 1990 Today's Date: 11/23/2020   History of Present Illness  30 yo male admitted 5/14 after found unresponsive with +UDS with seizure in ED and intubated with unknown down time. Extubatd 5/26. Acute hypoxic ischemic encephalopathy and rhabdomyolysis. 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 PMhx: drug abuse  Clinical Impression  Pt awake and able to state name. Pt with very garbled speech and difficult to understand. Pt able to acknowledge picture of kids and nod yes to being a gambler when asked about LUE tattoo. Pt with delayed response to questions and limited ability to follow commands with weakness and edema of right side, impaired balance, decreased ability with transfers and functional mobility who will benefit from acute therapy to maximize mobility, safety and function.   Pt's sister Baird Lyons provided home setup and PLOF via phone and states pt lived with parents and sister with family working varied shifts and able to provide 24hr assist. Baird Lyons also stated she and sister work for an ALF and are familiar with being caregivers.   SpO2 drop to 88% on RA with return to 3L throughout at 93% HR 105 137/86 in chair    Follow Up Recommendations CIR;Supervision/Assistance - 24 hour    Equipment Recommendations  Wheelchair (measurements PT);Rolling walker with 5" wheels;3in1 (PT);Wheelchair cushion (measurements PT)    Recommendations for Other Services Rehab consult     Precautions / Restrictions Precautions Precautions: Fall Precaution Comments: right sided weakness and edema, watch sats      Mobility  Bed Mobility Overal bed mobility: Needs Assistance Bed Mobility: Supine to Sit     Supine to sit: +2 for physical assistance;Mod assist     General bed mobility comments: physical assist to move legs off of bed, cues for hand placement and assist to lift trunk  from surface with HOB 35 degrees. Pt able to scoot to EOB with min assist for trunk control and increased time    Transfers Overall transfer level: Needs assistance   Transfers: Sit to/from Stand;Squat Pivot Transfers Sit to Stand: Max assist;+2 physical assistance   Squat pivot transfers: Max assist;+2 physical assistance     General transfer comment: stood from bed with RLE blocked and use of belt, pt unable to hold onto therapist with RUE for bil UE support and in standing with crouched posture and unable to achieve full upright x 2 trials and unable to weight shift or step. Scoot pivot from bed to chair with max +2 with over the back technique and use of belt with drop arm recliner toward pt left  Ambulation/Gait             General Gait Details: not yet able  Stairs            Wheelchair Mobility    Modified Rankin (Stroke Patients Only) Modified Rankin (Stroke Patients Only) Pre-Morbid Rankin Score: No symptoms Modified Rankin: Severe disability     Balance Overall balance assessment: Needs assistance Sitting-balance support: Bilateral upper extremity supported;Feet supported Sitting balance-Leahy Scale: Poor Sitting balance - Comments: pt with mod assist for sitting balance with tendency for anterior left lean Postural control: Left lateral lean   Standing balance-Leahy Scale: Zero Standing balance comment: max +2 assist with right knee blocked                             Pertinent Vitals/Pain Pain  Assessment: No/denies pain    Home Living Family/patient expects to be discharged to:: Private residence Living Arrangements: Parent Available Help at Discharge: Family;Available 24 hours/day Type of Home: Mobile home Home Access: Stairs to enter   Entrance Stairs-Number of Steps: 2 Home Layout: One level Home Equipment: None Additional Comments: sister Baird Lyons provided home information.    Prior Function Level of Independence: Independent                Hand Dominance        Extremity/Trunk Assessment   Upper Extremity Assessment Upper Extremity Assessment: Defer to OT evaluation    Lower Extremity Assessment Lower Extremity Assessment: RLE deficits/detail RLE Deficits / Details: pt with grossly 2-/5 strength with significant edema RLE. Pt with consistent buckling in standing    Cervical / Trunk Assessment Cervical / Trunk Assessment: Other exceptions Cervical / Trunk Exceptions: poor trunk control in sitting and standing with kyphotic posture  Communication   Communication: Expressive difficulties  Cognition Arousal/Alertness: Awake/alert Behavior During Therapy: Flat affect Overall Cognitive Status: Impaired/Different from baseline Area of Impairment: Orientation;Attention;Memory;Following commands;Safety/judgement                 Orientation Level: Disoriented to;Time Current Attention Level: Focused Memory: Decreased short-term memory Following Commands: Follows one step commands inconsistently;Follows one step commands with increased time Safety/Judgement: Decreased awareness of deficits     General Comments: Pt able to state name and that the picture in the room is his kids      General Comments      Exercises     Assessment/Plan    PT Assessment Patient needs continued PT services  PT Problem List Decreased strength;Decreased mobility;Decreased safety awareness;Decreased coordination;Decreased activity tolerance;Decreased cognition;Decreased balance;Decreased knowledge of use of DME;Cardiopulmonary status limiting activity       PT Treatment Interventions DME instruction;Therapeutic exercise;Balance training;Functional mobility training;Cognitive remediation;Therapeutic activities;Patient/family education;Neuromuscular re-education    PT Goals (Current goals can be found in the Care Plan section)  Acute Rehab PT Goals Patient Stated Goal: be able to return home PT Goal  Formulation: With family Time For Goal Achievement: 12/07/20 Potential to Achieve Goals: Fair    Frequency Min 3X/week   Barriers to discharge        Co-evaluation PT/OT/SLP Co-Evaluation/Treatment: Yes Reason for Co-Treatment: For patient/therapist safety;Complexity of the patient's impairments (multi-system involvement) PT goals addressed during session: Mobility/safety with mobility;Balance         AM-PAC PT "6 Clicks" Mobility  Outcome Measure Help needed turning from your back to your side while in a flat bed without using bedrails?: A Lot Help needed moving from lying on your back to sitting on the side of a flat bed without using bedrails?: Total Help needed moving to and from a bed to a chair (including a wheelchair)?: Total Help needed standing up from a chair using your arms (e.g., wheelchair or bedside chair)?: Total Help needed to walk in hospital room?: Total Help needed climbing 3-5 steps with a railing? : Total 6 Click Score: 7    End of Session Equipment Utilized During Treatment: Gait belt Activity Tolerance: Patient tolerated treatment well Patient left: in chair;with call bell/phone within reach;with chair alarm set;with nursing/sitter in room Nurse Communication: Mobility status;Precautions;Other (comment) (sequence for lateral transfer with 2 person and drop arm) PT Visit Diagnosis: Other abnormalities of gait and mobility (R26.89);Difficulty in walking, not elsewhere classified (R26.2);Muscle weakness (generalized) (M62.81);Unsteadiness on feet (R26.81)    Time: 1443-1540 PT Time Calculation (min) (ACUTE ONLY): 31  min   Charges:   PT Evaluation $PT Eval Moderate Complexity: 1 Mod          Tationa Stech P, PT Acute Rehabilitation Services Pager: [phone] Office: [phone]   Enedina Finner Emya Picado 11/23/2020, 11:46 AM

## 2020-11-23 NOTE — Significant Event (Signed)
Notified by charge RN that bed request status to 2C got rejected; found out that receiving unit requested it to be cancelled. This RN called to NP Nehemiah Settle and told her about the rejected bed.   Patient has tolerated being off precedex. Has been in the chair most of the day today; has been calm, following commands and safety directions. Received his scheduled medications only.   Received instructions from NP Brooke not to be put patient back on precedex drip. If PRN medications is needed above what is on his [facility], for staff to call critical care team first.   Wallis Bamberg, RN

## 2020-11-23 NOTE — Evaluation (Signed)
Occupational Therapy Evaluation Patient Details Name: Nathaniel Lynn MRN: [id number] DOB: September 04, 1990 Today's Date: 11/23/2020    History of Present Illness 30 yo male admitted 5/14 after found unresponsive with +UDS with seizure in ED and intubated with unknown down time. Extubatd 5/26. Acute hypoxic ischemic encephalopathy and rhabdomyolysis. 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 PMhx: drug abuse   Clinical Impression   PTA patient independent. Admitted for above and presenting with problem list below, including impaired cognition, balance, weakness, activity tolerance, and increased edema on R side.  Pt disoriented to time, is inconsistently following simple commands, requires increased time for initiation and processing. He requires max assist to wash face and total assist for Hand over hand to complete suction oral care, overall total assist for ADLs.  Max +2 for transfers to recliner today, lateral scoot.  He demonstrates good distal strength in grasp but unable to raise UEs without assist.  PT called family for PLOF and home setup.  Believe he will benefit from continued OT services while admitted and after dc at CIR level to optimize return to PLOF and decrease burden of care.  Will follow.     Follow Up Recommendations  CIR;Supervision/Assistance - 24 hour    Equipment Recommendations   (TBD at next venue of care)    Recommendations for Other Services Rehab consult     Precautions / Restrictions Precautions Precautions: Fall Precaution Comments: right sided weakness and edema, watch sats Restrictions Weight Bearing Restrictions: No      Mobility Bed Mobility Overal bed mobility: Needs Assistance Bed Mobility: Supine to Sit     Supine to sit: +2 for physical assistance;Mod assist     General bed mobility comments: physical assist to move legs off of bed, cues for hand placement and assist to lift trunk from surface with HOB 35 degrees. Pt  able to scoot to EOB with min assist for trunk control and increased time    Transfers Overall transfer level: Needs assistance   Transfers: Sit to/from Stand;Lateral/Scoot Transfers Sit to Stand: Max assist;+2 physical assistance   Squat pivot transfers: Max assist;+2 physical assistance    Lateral/Scoot Transfers: Max assist;+2 physical assistance;+2 safety/equipment General transfer comment: sit to stand from EOB x 2 with max assist +2, unable to achieve full upright position or weightshift.  Successful lateral scoot towards L side with max assist +2.    Balance Overall balance assessment: Needs assistance Sitting-balance support: Bilateral upper extremity supported;Feet supported Sitting balance-Leahy Scale: Poor Sitting balance - Comments: pt with mod assist for sitting balance with tendency for anterior left lean Postural control: Left lateral lean   Standing balance-Leahy Scale: Zero Standing balance comment: max +2 assist with right knee blocked                           ADL either performed or assessed with clinical judgement   ADL Overall ADL's : Needs assistance/impaired     Grooming: Sitting;Oral care;Maximal assistance Grooming Details (indicate cue type and reason): suction oral care with [facility] support, total assist using RUE, able to partially wash face with L UE but requires support at elbow to bring hand to face                       Toileting - Clothing Manipulation Details (indicate cue type and reason): incontinent bowel and bladder in bed, total assist     Functional mobility  during ADLs: +2 for physical assistance;+2 for safety/equipment;Maximal assistance General ADL Comments: total assist for self care at this time     Vision   Additional Comments: further assessment, able to scan to therapist cues, identify number of fingers presented     Perception     Praxis      Pertinent Vitals/Pain Pain Assessment: Faces Faces Pain  Scale: Hurts little more Pain Location: generalized with movement Pain Descriptors / Indicators: Discomfort;Grimacing Pain Intervention(s): Limited activity within patient's tolerance;Monitored during session;Repositioned     Hand Dominance     Extremity/Trunk Assessment Upper Extremity Assessment Upper Extremity Assessment: RUE deficits/detail;LUE deficits/detail RUE Deficits / Details: grasp 4/5, unable to raise UE or hold arm up when placed; edema RUE Coordination: decreased fine motor;decreased gross motor LUE Deficits / Details: grasp 4/5, able to raise UE or keep elevated when placed LUE Coordination: decreased gross motor;decreased fine motor   Lower Extremity Assessment Lower Extremity Assessment: Defer to PT evaluation RLE Deficits / Details: pt with grossly 2-/5 strength with significant edema RLE. Pt with consistent buckling in standing   Cervical / Trunk Assessment Cervical / Trunk Assessment: Other exceptions Cervical / Trunk Exceptions: poor trunk control in sitting and standing with kyphotic posture   Communication Communication Communication: Expressive difficulties   Cognition Arousal/Alertness: Awake/alert Behavior During Therapy: Flat affect Overall Cognitive Status: Impaired/Different from baseline Area of Impairment: Orientation;Attention;Memory;Following commands;Safety/judgement;Awareness;Problem solving                 Orientation Level: Disoriented to;Time Current Attention Level: Focused Memory: Decreased short-term memory Following Commands: Follows one step commands inconsistently;Follows one step commands with increased time Safety/Judgement: Decreased awareness of deficits Awareness: Intellectual Problem Solving: Slow processing;Decreased initiation;Difficulty sequencing;Requires verbal cues;Requires tactile cues General Comments: pt requires cueing for attention to task, inconsistently following 1 step commands with increased time. able to  state name and place with choices.   General Comments       Exercises     Shoulder Instructions      Home Living Family/patient expects to be discharged to:: Private residence Living Arrangements: Parent Available Help at Discharge: Family;Available 24 hours/day Type of Home: Mobile home Home Access: Stairs to enter Entrance Stairs-Number of Steps: 2   Home Layout: One level     Bathroom Shower/Tub: Walk-in shower;Tub/shower unit   Bathroom Toilet: Handicapped height     Home Equipment: None   Additional Comments: sister Baird Lyons provided home information.      Prior Functioning/Environment Level of Independence: Independent                 OT Problem List: Decreased strength;Decreased range of motion;Decreased activity tolerance;Impaired balance (sitting and/or standing);Impaired vision/perception;Decreased coordination;Decreased cognition;Decreased safety awareness;Decreased knowledge of use of DME or AE;Decreased knowledge of precautions;Cardiopulmonary status limiting activity;Obesity;Pain;Impaired UE functional use;Increased edema      OT Treatment/Interventions: Self-care/ADL training;Therapeutic exercise;Neuromuscular education;DME and/or AE instruction;Therapeutic activities;Cognitive remediation/compensation;Visual/perceptual remediation/compensation;Patient/family education;Balance training    OT Goals(Current goals can be found in the care plan section) Acute Rehab OT Goals Patient Stated Goal: be able to return home Time For Goal Achievement: 12/07/20 Potential to Achieve Goals: Good  OT Frequency: Min 2X/week   Barriers to D/C:            Co-evaluation PT/OT/SLP Co-Evaluation/Treatment: Yes Reason for Co-Treatment: Complexity of the patient's impairments (multi-system involvement);Necessary to address cognition/behavior during functional activity;For patient/therapist safety;To address functional/ADL transfers PT goals addressed during session:  Mobility/safety with mobility;Balance OT goals addressed during session: ADL's and self-care  AM-PAC OT "6 Clicks" Daily Activity     Outcome Measure Help from another person eating meals?: Total Help from another person taking care of personal grooming?: A Lot Help from another person toileting, which includes using toliet, bedpan, or urinal?: Total Help from another person bathing (including washing, rinsing, drying)?: Total Help from another person to put on and taking off regular upper body clothing?: Total Help from another person to put on and taking off regular lower body clothing?: Total 6 Click Score: 7   End of Session Equipment Utilized During Treatment: Gait belt Nurse Communication: Mobility status  Activity Tolerance: Patient limited by fatigue Patient left: in chair;with call bell/phone within reach;with chair alarm set;with nursing/sitter in room  OT Visit Diagnosis: Other abnormalities of gait and mobility (R26.89);Muscle weakness (generalized) (M62.81);Other symptoms and signs involving the nervous system (R29.898);Other symptoms and signs involving cognitive function;Cognitive communication deficit (R41.841);Pain Symptoms and signs involving cognitive functions: Cerebral infarction Pain - part of body:  (generalized)                Time: 9326-7124 OT Time Calculation (min): 31 min Charges:  OT General Charges $OT Visit: 1 Visit OT Evaluation $OT Eval High Complexity: 1 High  Barry Brunner, OT Acute Rehabilitation Services Pager [phone] Office [phone]   Chancy Milroy 11/23/2020, 12:58 PM

## 2020-11-23 NOTE — Progress Notes (Signed)
eLink Physician-Brief Progress Note Patient Name: Nathaniel Lynn DOB: 1990-10-16 MRN: [id number]   Date of Service  11/23/2020  HPI/Events of Note  Bedside RN concerned about intermittent agitation but patient is oriented x 2 when I assessed him. Main concern was that he tried to get out of bed risking a fall.  eICU Interventions  PRN Haldol dosing Regimen adjusted. Posey restraint ordered to minimize risk of falling out of bed.        Thomasene Lot Tarrance Januszewski 11/23/2020, 8:25 PM

## 2020-11-24 DIAGNOSIS — E870 Hyperosmolality and hypernatremia: Secondary | ICD-10-CM

## 2020-11-24 LAB — CBC
HCT: 27.3 % — ABNORMAL LOW (ref 39.0–52.0)
Hemoglobin: 8.8 g/dL — ABNORMAL LOW (ref 13.0–17.0)
MCH: 30.9 pg (ref 26.0–34.0)
MCHC: 32.2 g/dL (ref 30.0–36.0)
MCV: 95.8 fL (ref 80.0–100.0)
Platelets: 476 10*3/uL — ABNORMAL HIGH (ref 150–400)
RBC: 2.85 MIL/uL — ABNORMAL LOW (ref 4.22–5.81)
RDW: 12.2 % (ref 11.5–15.5)
WBC: 8.1 10*3/uL (ref 4.0–10.5)
nRBC: 0 % (ref 0.0–0.2)

## 2020-11-24 LAB — GLUCOSE, CAPILLARY
Glucose-Capillary: 98 mg/dL (ref 70–99)
Glucose-Capillary: 116 mg/dL — ABNORMAL HIGH (ref 70–99)
Glucose-Capillary: 117 mg/dL — ABNORMAL HIGH (ref 70–99)
Glucose-Capillary: 93 mg/dL (ref 70–99)

## 2020-11-24 LAB — BASIC METABOLIC PANEL
Anion gap: 8 (ref 5–15)
BUN: 38 mg/dL — ABNORMAL HIGH (ref 6–20)
CO2: 25 mmol/L (ref 22–32)
Calcium: 8.8 mg/dL — ABNORMAL LOW (ref 8.9–10.3)
Chloride: 111 mmol/L (ref 98–111)
Creatinine, Ser: 1.67 mg/dL — ABNORMAL HIGH (ref 0.61–1.24)
GFR, Estimated: 57 mL/min — ABNORMAL LOW (ref >60)
Glucose, Bld: 99 mg/dL (ref 70–99)
Potassium: 3.9 mmol/L (ref 3.5–5.1)
Sodium: 144 mmol/L (ref 135–145)

## 2020-11-24 LAB — MAGNESIUM: Magnesium: 2.1 mg/dL (ref 1.7–2.4)

## 2020-11-24 NOTE — Progress Notes (Signed)
PROGRESS NOTE  Nathaniel Lynn  UMP:[id number] DOB: 1991-01-27 DOA: 11/10/2020 PCP: Oneita Hurt, No   Brief Narrative Nathaniel Lynn is a 30 y.o. male with a history of narcotic abuse recently release from 2 year prison sentence who was found down reportedly "getting high" at girlfriend's house on 5/14, taken to [facility] for evaluation. Labs notable for +UDS (amphetamines, opiates, cannabinoids), Cr 2.33 (baseline 0.8-1.2), CK level >50K, LA 3.0. CT Head was negative for acute pathology. While in the ED, the patient was given Ativan x 2 for tonic-clonic seizure-like activity and was intubated shortly after seizure-like activity was noted. Concern for "posturing". Transferred to [facility] ICU for further evaluation and continuous EEG monitoring. Empiric antibiotics were given and withdrawn after unremarkable LP performed 5/19 with acyclovir stopped after negative HSV. AKI improved with fluids. Tracheal aspirate grew stenotrophomonas and flavobacterium for which 10-day course of levaquin was started on 5/21. No purposeful movements despite weaning sedation initially, though he eventually began following commands intermittently. EEG demonstrated nonspecific moderate diffuse encephalopathy without epileptiform discharges. MRI brain suggested global hypoxic ischemic injury. Precedex was given for agitation and patient extubated 5/25. The patient was transferred to hospitalist service 5/28 still requiring prn sedation for agitation and having post-ATN diuresis and hypernatremia which has improved.   Assessment & Plan: Active Problems:   Status epilepticus (HCC)   Acute respiratory failure with hypoxemia (HCC)   Encephalopathy   Pressure injury of skin   AKI (acute kidney injury) (HCC)   Aspiration pneumonia (HCC)   Hypernatremia  Acute toxic, metabolic encephalopathy and concern for now chronic hypoxic encephalopathy: UDS positive for amphetamines, THC, opiates. CSF Cx unremarkable, neg for viral or bacterial process,  HSV neg 5/21. EEG with moderate nonspecific slowing, no epileptiform discharges. MRI Brain 5/16 suggestive of nonspecific encephalopathy with motion-degraded repeat MRI 5/24 consistent with expected evolution of hypoxic injury without other new or progressive findings.  - Continue weaning sedation as able. Continue prn ativan/haldol. - Continue seroquel 50mg  po BID (last dose increase 5/26) - Oxycodone scheduled w/bowel regimen - Continue clonidine adjunct - Empiric thiamine, folate, MVM  Acute hypoxic respiratory failure: Due to encephalopathy, now extubated. Also due to Stenotrophomonas and flavobacterium indologenes tracheobronchitis. - Wean O2 as able. - Continue levaquin x10 days, stop date 5/31.   AKI, Hypernatremia: Improving.  - Allow autodiuresis, continue fluid restriction Rhabdomyolysis:   Demand myocardial ischemia: Troponin elevation more consistent with rhabdomyolysis, renal dysfunction than primary ACS.   HTN:  - May benefit with improvement in agitation and hypervolemia.  - Clonidine as above  LFT elevation: Due to toxins and rhabdomyolysis:  - Continue monitoring for improvement  IVDU, Polysubstance abuse:  - Tx as above - NR HIV - No vegetations on TTE  Lactic acidosis: Resolved.  Moderate protein calorie malnutrition:  - Supplement as able.  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 Subcutaneous fat may be visible but bone, tendon or muscle are NOT exposed. (Active)  11/20/20 1500  Location: Ear  Location Orientation: Left;Posterior  Staging: Stage 3 -  Full thickness tissue loss. Subcutaneous fat may be visible but bone, tendon or muscle are NOT  exposed.  Wound Description (Comments):   Present on Admission: No  Obesity: Estimated body mass index is 35.71 kg/m as calculated from the following:   Height as of an earlier encounter on 11/10/20: 5\' 10"  (1.778 m).   Weight as of this encounter: 112.9 kg.  DVT prophylaxis: Heparin Code Status: Full Family Communication: None at bedside Disposition Plan:  Status is: Inpatient  Remains inpatient appropriate because:Hemodynamically unstable, Persistent severe electrolyte disturbances, Altered mental status and Inpatient level of care appropriate due to severity of illness  Dispo: The patient is from: Home              Anticipated d/c is to: TBD, CIR consulted              Patient currently is not medically stable to d/c.   Difficult to place patient No  Consultants:   PCCM  Procedures:   ETT  Antimicrobials:  Levaquin   Subjective: States year is 58. Has sitter who reports improving but still some agitation. Denies pain or dyspnea.   Objective: Vitals:   11/24/20 0500 11/24/20 0600 11/24/20 0700 11/24/20 0800  BP: 113/63 132/88 118/73   Pulse: 74 65 77 (!) 106  Resp: (!) 27 18 14 16   Temp:    98.6 F (37 C)  TempSrc:    Oral  SpO2: 92% (!) 87% 97%   Weight:        Intake/Output Summary (Last 24 hours) at 11/24/2020 Last data filed at 11/24/2020 0600 Gross per 24 hour  Intake 1339.52 ml  Output 5200 ml  Net -3860.48 ml   Filed Weights   11/17/20 0400 11/18/20 0400 11/23/20 0419  Weight: 114.9 kg 112.9 kg 112.9 kg    Gen: 30 y.o. male in no distress Pulm: Non-labored breathing supplemental oxygen. Clear to auscultation bilaterally.  CV: Regular rate and rhythm. No murmur, rub, or gallop. No JVD, trace pedal edema. GI: Abdomen soft, non-tender, non-distended, with normoactive bowel sounds. No organomegaly or masses felt. Ext: Warm, no deformities. ++RUE edema diffusely Skin: Tattooed diffusely with linear keloids on RUA and spotty scars on  legs. Neuro: Alert and not oriented. Minimally cooperative with exam, moving all extremities.  Psych: Judgement and insight appear impaired. Calm at this time.  Data Reviewed: I have personally reviewed following labs and imaging studies  CBC: Recent Labs  Lab 11/18/20 0522 11/19/20 0450 11/20/20 0517 11/22/20 0319 11/24/20 0352  WBC 9.1 9.7 9.3 9.3 8.1  HGB 9.2* 9.2* 8.8* 8.8* 8.8*  HCT 28.7* 29.4* 28.6* 28.6* 27.3*  MCV 97.3 98.3 100.4* 99.3 95.8  PLT 276 329 367 418* 476*   Basic Metabolic Panel: Recent Labs  Lab 11/18/20 0522 11/19/20 0450 11/20/20 0517 11/21/20 0854 11/22/20 0319 11/23/20 0439 11/24/20 0352  NA 145   < > 151* 155* 149* 151* 144  K 4.1   < > 4.6 4.3 3.7 3.6 3.9  CL 113*   < > 118* 121* 118* 117* 111  CO2 25   < > 26 25 25 25 25   GLUCOSE 153*   < > 138* 116* 105* 109* 99  BUN 61*   < > 62* 55* 48* 48* 38*  CREATININE 2.63*   < > 2.68* 2.06* 1.84* 1.85* 1.67*  CALCIUM 7.5*   < > 8.6* 9.0 8.3* 8.9 8.8*  MG 2.4  --   --   --  2.4  --  2.1  PHOS  --   --   --   --  4.4  --   --    < > =  values in this interval not displayed.   GFR: Estimated Creatinine Clearance: 82.9 mL/min (A) (by C-G formula based on SCr of 1.67 mg/dL (H)). Liver Function Tests: Recent Labs  Lab 11/18/20 0522 11/19/20 0450 11/21/20 0854  AST 135* 151* 130*  ALT 118* 150* 166*  ALKPHOS 49 66 73  BILITOT 0.5 0.5 0.8  PROT 5.1* 5.5* 5.9*  ALBUMIN 1.8* 2.0* 2.4*   No results for input(s): LIPASE, AMYLASE in the last 168 hours. Recent Labs  Lab 11/18/20 0658  AMMONIA 12   Coagulation Profile: No results for input(s): INR, PROTIME in the last 168 hours. Cardiac Enzymes: Recent Labs  Lab 11/21/20 0854  CKTOTAL 819*   BNP (last 3 results) No results for input(s): PROBNP in the last 8760 hours. HbA1C: No results for input(s): HGBA1C in the last 72 hours. CBG: Recent Labs  Lab 11/23/20 1128 11/23/20 1528 11/23/20 2033 11/23/20 2347 11/24/20 0357  GLUCAP 102*  88 75 80 98   Lipid Profile: No results for input(s): CHOL, HDL, LDLCALC, TRIG, CHOLHDL, LDLDIRECT in the last 72 hours. Thyroid Function Tests: No results for input(s): TSH, T4TOTAL, FREET4, T3FREE, THYROIDAB in the last 72 hours. Anemia Panel: No results for input(s): VITAMINB12, FOLATE, FERRITIN, TIBC, IRON, RETICCTPCT in the last 72 hours. Urine analysis:    Component Value Date/Time   COLORURINE YELLOW 11/15/2020 1328   APPEARANCEUR HAZY (A) 11/15/2020 1328   LABSPEC 1.012 11/15/2020 1328   PHURINE 5.0 11/15/2020 1328   GLUCOSEU NEGATIVE 11/15/2020 1328   HGBUR SMALL (A) 11/15/2020 1328   BILIRUBINUR NEGATIVE 11/15/2020 1328   KETONESUR NEGATIVE 11/15/2020 1328   PROTEINUR NEGATIVE 11/15/2020 1328   NITRITE NEGATIVE 11/15/2020 1328   LEUKOCYTESUR NEGATIVE 11/15/2020 1328   Recent Results (from the past 240 hour(s))  Culture, Respiratory w Gram Stain     Status: None   Collection Time: 11/15/20  9:35 AM   Specimen: Tracheal Aspirate; Respiratory  Result Value Ref Range Status   Specimen Description TRACHEAL ASPIRATE  Final   Special Requests NONE  Final   Gram Stain   Final    RARE WBC PRESENT,BOTH PMN AND MONONUCLEAR RARE GRAM NEGATIVE RODS Performed at [facility], [address], [place], [place] [address]    Culture   Final    ABUNDANT STENOTROPHOMONAS MALTOPHILIA ABUNDANT FLAVOBACTERIUM INDOLOGENES    Report Status 11/19/2020 FINAL  Final   Organism ID, Bacteria STENOTROPHOMONAS MALTOPHILIA  Final   Organism ID, Bacteria FLAVOBACTERIUM INDOLOGENES  Final      Susceptibility   Stenotrophomonas maltophilia - MIC*    LEVOFLOXACIN 0.25 SENSITIVE Sensitive     TRIMETH/SULFA <=20 SENSITIVE Sensitive     * ABUNDANT STENOTROPHOMONAS MALTOPHILIA   Flavobacterium indologenes - MIC*    CEFAZOLIN >=64 RESISTANT Resistant     CEFTAZIDIME 16 INTERMEDIATE Intermediate     CIPROFLOXACIN 0.5 SENSITIVE Sensitive     GENTAMICIN 8 INTERMEDIATE Intermediate     IMIPENEM  >=16 RESISTANT Resistant     TRIMETH/SULFA <=20 SENSITIVE Sensitive     PIP/TAZO 8 SENSITIVE Sensitive     * ABUNDANT FLAVOBACTERIUM INDOLOGENES  CSF culture w Gram Stain     Status: None   Collection Time: 11/15/20 10:52 AM   Specimen: PATH Cytology CSF; Cerebrospinal Fluid  Result Value Ref Range Status   Specimen Description CSF  Final   Special Requests NONE  Final   Gram Stain CYTOSPIN SMEAR NO WBC SEEN NO ORGANISMS SEEN   Final   Culture   Final  NO GROWTH 3 DAYS Performed at [facility], [address] [place], [place] [address]    Report Status 11/18/2020 FINAL  Final      Radiology Studies: No results found.  Scheduled Meds: . Chlorhexidine Gluconate Cloth  6 each Topical Daily  . cloNIDine  0.4 mg Oral Q6H  . docusate sodium  100 mg Oral BID  . feeding supplement  237 mL Oral TID BM  . folic acid  1 mg Oral Daily  . heparin injection (subcutaneous)  5,000 Units Subcutaneous Q8H  . insulin aspart  0-6 Units Subcutaneous Q4H  . mouth rinse  15 mL Mouth Rinse BID  . multivitamin with minerals  1 tablet Oral Daily  . mupirocin cream   Topical Daily  . oxyCODONE  5 mg Oral TID  . polyethylene glycol  17 g Oral Daily  . polyvinyl alcohol  1 drop Both Eyes TID  . QUEtiapine  50 mg Oral BID  . sodium chloride flush  10-40 mL Intracatheter Q12H  . thiamine  100 mg Oral Daily   Continuous Infusions: . sodium chloride 500 mL (11/20/20 2221)  . dexmedetomidine (PRECEDEX) IV infusion Stopped (11/23/20 1110)  . levofloxacin (LEVAQUIN) IV 750 mg (11/23/20 1250)     LOS: 14 days   Time spent: 35 minutes.  Tyrone Nineyan B Hideko Esselman, MD [facility] [email or web address] 11/24/2020, 9:27 AM

## 2020-11-24 NOTE — Plan of Care (Signed)
  Problem: Safety: Goal: Non-violent Restraint(s) Outcome: Progressing   Problem: Clinical Measurements: Goal: Ability to maintain clinical measurements within normal limits will improve Outcome: Progressing Goal: Will remain free from infection Outcome: Progressing Goal: Diagnostic test results will improve Outcome: Progressing Goal: Respiratory complications will improve Outcome: Progressing Goal: Cardiovascular complication will be avoided Outcome: Progressing   

## 2020-11-24 NOTE — Progress Notes (Signed)
Inpatient Rehab Admissions Coordinator:   I met with Pt. At bedside to discuss potential CIR admission. Pt. Nodded that he was interested and gave permission for me to call his mother to discuss. I will reach out to Pt.s mother for further discussion.   Clemens Catholic, [place], [place] Admissions Coordinator  [phone] ([place]) [phone] (office)

## 2020-11-24 NOTE — Progress Notes (Signed)
Fentanyl 131mcgs/2mls IV vial tried returning to pyxis, machined not recording action. Notified clinical pharmacist Selena Batten. [place] handed over to [place], vial successfully returned to pyxis, however machine still asking for waste.

## 2020-11-25 LAB — COMPREHENSIVE METABOLIC PANEL
ALT: 60 U/L — ABNORMAL HIGH (ref 0–44)
AST: 30 U/L (ref 15–41)
Albumin: 2.6 g/dL — ABNORMAL LOW (ref 3.5–5.0)
Alkaline Phosphatase: 55 U/L (ref 38–126)
Anion gap: 7 (ref 5–15)
BUN: 30 mg/dL — ABNORMAL HIGH (ref 6–20)
CO2: 24 mmol/L (ref 22–32)
Calcium: 8.8 mg/dL — ABNORMAL LOW (ref 8.9–10.3)
Chloride: 108 mmol/L (ref 98–111)
Creatinine, Ser: 1.59 mg/dL — ABNORMAL HIGH (ref 0.61–1.24)
GFR, Estimated: >60 mL/min (ref >60)
Glucose, Bld: 102 mg/dL — ABNORMAL HIGH (ref 70–99)
Potassium: 3.4 mmol/L — ABNORMAL LOW (ref 3.5–5.1)
Sodium: 139 mmol/L (ref 135–145)
Total Bilirubin: 0.6 mg/dL (ref 0.3–1.2)
Total Protein: 5.6 g/dL — ABNORMAL LOW (ref 6.5–8.1)

## 2020-11-25 LAB — GLUCOSE, CAPILLARY
Glucose-Capillary: 104 mg/dL — ABNORMAL HIGH (ref 70–99)
Glucose-Capillary: 126 mg/dL — ABNORMAL HIGH (ref 70–99)
Glucose-Capillary: 156 mg/dL — ABNORMAL HIGH (ref 70–99)
Glucose-Capillary: 172 mg/dL — ABNORMAL HIGH (ref 70–99)
Glucose-Capillary: 87 mg/dL (ref 70–99)
Glucose-Capillary: 94 mg/dL (ref 70–99)
Glucose-Capillary: 94 mg/dL (ref 70–99)
Glucose-Capillary: 96 mg/dL (ref 70–99)

## 2020-11-25 MED ORDER — POTASSIUM CHLORIDE CRYS ER 20 MEQ PO TBCR
40.0000 meq | EXTENDED_RELEASE_TABLET | Freq: Once | ORAL | Status: AC
  Administered 2020-11-25: 40 meq via ORAL
  Filled 2020-11-25: qty 2

## 2020-11-25 NOTE — Progress Notes (Signed)
Inpatient Rehab Admissions Coordinator:   I spoke with Pt.'s mother and she confirmed that she would like for Pt. To come to CIR when medically ready. She confirmed that family can rotate to provide 24/7 assist at d/c. She requests financial counselors reach out to screen Pt. For medicaid eligibility.   Megan Salon, MS, CCC-SLP Rehab Admissions Coordinator  [phone] ([place]) [phone] (office)

## 2020-11-25 NOTE — Progress Notes (Signed)
PROGRESS NOTE  Nathaniel Lynn  IOX:[id number] DOB: August 21, 1990 DOA: 11/10/2020 PCP: Oneita Hurt, No   Brief Narrative Nathaniel Lynn is a 30 y.o. male with a history of narcotic abuse recently release from 2 year prison sentence who was found down reportedly "getting high" at girlfriend's house on 5/14, taken to [facility] for evaluation. Labs notable for +UDS (amphetamines, opiates, cannabinoids), Cr 2.33 (baseline 0.8-1.2), CK level >50K, LA 3.0. CT Head was negative for acute pathology. While in the ED, the patient was given Ativan x 2 for tonic-clonic seizure-like activity and was intubated shortly after seizure-like activity was noted. Concern for "posturing". Transferred to [facility] ICU for further evaluation and continuous EEG monitoring. Empiric antibiotics were given and withdrawn after unremarkable LP performed 5/19 with acyclovir stopped after negative HSV. AKI improved with fluids. Tracheal aspirate grew stenotrophomonas and flavobacterium for which 10-day course of levaquin was started on 5/21. No purposeful movements despite weaning sedation initially, though he eventually began following commands intermittently. EEG demonstrated nonspecific moderate diffuse encephalopathy without epileptiform discharges. MRI brain suggested global hypoxic ischemic injury. Precedex was given for agitation and patient extubated 5/25. The patient was transferred to hospitalist service 5/28 still requiring prn sedation for agitation and having post-ATN diuresis and hypernatremia which has improved.   Assessment & Plan: Active Problems:   Status epilepticus (HCC)   Acute respiratory failure with hypoxemia (HCC)   Encephalopathy   Pressure injury of skin   AKI (acute kidney injury) (HCC)   Aspiration pneumonia (HCC)   Hypernatremia  Acute toxic, metabolic encephalopathy and concern for now chronic hypoxic encephalopathy: UDS positive for amphetamines, THC, opiates. CSF Cx unremarkable, neg for viral or bacterial process,  HSV neg 5/21. EEG with moderate nonspecific slowing, no epileptiform discharges. MRI Brain 5/16 suggestive of nonspecific encephalopathy with motion-degraded repeat MRI 5/24 consistent with expected evolution of hypoxic injury without other new or progressive findings.  - Continue prn haldol. Hasn't needed ativan since 5/27. Continue low dose fentanyl to maintain RASS goal.  - Continue seroquel 50mg  po BID (last dose increase 5/26) - Oxycodone scheduled w/bowel regimen - Continue clonidine adjunct - Empiric thiamine, folate, MVM  Acute hypoxic respiratory failure: Due to encephalopathy, now extubated. Also due to Stenotrophomonas and flavobacterium indologenes tracheobronchitis. - Wean O2 as able. On room air as of 5/29. - Continue levaquin x10 days, stop date 5/31.   AKI, Hypernatremia: Improving.  - Allow post-ATN autodiuresis, another 7.5L UOP over past 24 hours.   Rhabdomyolysis: Improving.   Hypokalemia: Supplement today and monitor.   Demand myocardial ischemia: Troponin elevation more consistent with rhabdomyolysis, renal dysfunction than primary ACS.   HTN:  - May benefit with improvement in agitation and hypervolemia.  - Clonidine as above  LFT elevation: Due to toxins and rhabdomyolysis:  - Continue monitoring for improvement  IVDU, Polysubstance abuse:  - Tx as above - NR HIV - No vegetations on TTE  Lactic acidosis: Resolved.  Moderate protein calorie malnutrition:  - Supplement as able.  LUE superficial thrombosis, RUE/ RLE edema: Improving edema with diuresis.  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 Subcutaneous fat may be visible but bone, tendon or muscle are NOT exposed. (Active)  11/20/20 1500  Location: Ear  Location  Orientation: Left;Posterior  Staging: Stage 3 -  Full thickness tissue loss. Subcutaneous fat may be visible but bone, tendon or muscle are NOT exposed.  Wound Description (Comments):   Present on Admission: No    Obesity: Estimated body mass index is 35.71 kg/m as calculated from the following:   Height as of an earlier encounter on 11/10/20: 5\' 10"  (1.778 m).   Weight as of this encounter: 112.9 kg.  DVT prophylaxis: Heparin Code Status: Full Family Communication: None at bedside Disposition Plan:  Status is: Inpatient  Remains inpatient appropriate because:Hemodynamically unstable, Persistent severe electrolyte disturbances, Altered mental status and Inpatient level of care appropriate due to severity of illness  Dispo: The patient is from: Home              Anticipated d/c is to: TBD, CIR consulted              Patient currently is not medically stable to d/c.   Difficult to place patient No  Consultants:   PCCM  Procedures:   ETT  Antimicrobials:  Levaquin   Subjective: Mentation not appreciably changed from yesterday. Reports some back pain but difficult to characterize further. No other complaints. Required haldol last night but calm today.  Objective: Vitals:   11/25/20 0740 11/25/20 0743 11/25/20 1150 11/25/20 1200  BP: 136/89   122/76  Pulse:    82  Resp: 19   19  Temp:  98.9 F (37.2 C) 98.3 F (36.8 C)   TempSrc:  Oral Oral   SpO2: 98%   (!) 86%  Weight:        Intake/Output Summary (Last 24 hours) at 11/25/2020 1346 Last data filed at 11/25/2020 1200 Gross per 24 hour  Intake 1107 ml  Output 8400 ml  Net -7293 ml   Filed Weights   11/17/20 0400 11/18/20 0400 11/23/20 0419  Weight: 114.9 kg 112.9 kg 112.9 kg   Gen: 30 y.o. male in no distress Pulm: Nonlabored breathing room air. Clear. CV: Regular rate and rhythm. No murmur, rub, or gallop. No JVD, improving edema. GI: Abdomen soft, non-tender, non-distended, with normoactive bowel sounds.   Ext: Warm, no deformities Skin: No new rashes, lesions or ulcers on visualized skin. Neuro: Alert, disoriented. No focal neurological deficits. Psych: Judgement and insight appear impaired.  Data Reviewed: I have personally reviewed following labs and imaging studies  CBC: Recent Labs  Lab 11/19/20 0450 11/20/20 0517 11/22/20 0319 11/24/20 0352  WBC 9.7 9.3 9.3 8.1  HGB 9.2* 8.8* 8.8* 8.8*  HCT 29.4* 28.6* 28.6* 27.3*  MCV 98.3 100.4* 99.3 95.8  PLT 329 367 418* 476*   Basic Metabolic Panel: Recent Labs  Lab 11/21/20 0854 11/22/20 0319 11/23/20 0439 11/24/20 0352 11/25/20 0330  NA 155* 149* 151* 144 139  K 4.3 3.7 3.6 3.9 3.4*  CL 121* 118* 117* 111 108  CO2 25 25 25 25 24   GLUCOSE 116* 105* 109* 99 102*  BUN 55* 48* 48* 38* 30*  CREATININE 2.06* 1.84* 1.85* 1.67* 1.59*  CALCIUM 9.0 8.3* 8.9 8.8* 8.8*  MG  --  2.4  --  2.1  --   PHOS  --  4.4  --   --   --    GFR: Estimated Creatinine Clearance: 87.1 mL/min (A) (by C-G formula based on SCr of 1.59 mg/dL (H)). Liver Function Tests: Recent Labs  Lab 11/19/20 0450 11/21/20 0854 11/25/20 0330  AST 151* 130* 30  ALT 150* 166* 60*  ALKPHOS 66 73 55  BILITOT 0.5 0.8 0.6  PROT  5.5* 5.9* 5.6*  ALBUMIN 2.0* 2.4* 2.6*   No results for input(s): LIPASE, AMYLASE in the last 168 hours. No results for input(s): AMMONIA in the last 168 hours. Coagulation Profile: No results for input(s): INR, PROTIME in the last 168 hours. Cardiac Enzymes: Recent Labs  Lab 11/21/20 0854  CKTOTAL 819*   BNP (last 3 results) No results for input(s): PROBNP in the last 8760 hours. HbA1C: No results for input(s): HGBA1C in the last 72 hours. CBG: Recent Labs  Lab 11/24/20 1610 11/24/20 2005 11/25/20 0319 11/25/20 0741 11/25/20 1149  GLUCAP 117* 93 94 104* 94   Lipid Profile: No results for input(s): CHOL, HDL, LDLCALC, TRIG, CHOLHDL, LDLDIRECT in the last 72 hours. Thyroid Function Tests: No results for input(s): TSH,  T4TOTAL, FREET4, T3FREE, THYROIDAB in the last 72 hours. Anemia Panel: No results for input(s): VITAMINB12, FOLATE, FERRITIN, TIBC, IRON, RETICCTPCT in the last 72 hours. Urine analysis:    Component Value Date/Time   COLORURINE YELLOW 11/15/2020 1328   APPEARANCEUR HAZY (A) 11/15/2020 1328   LABSPEC 1.012 11/15/2020 1328   PHURINE 5.0 11/15/2020 1328   GLUCOSEU NEGATIVE 11/15/2020 1328   HGBUR SMALL (A) 11/15/2020 1328   BILIRUBINUR NEGATIVE 11/15/2020 1328   KETONESUR NEGATIVE 11/15/2020 1328   PROTEINUR NEGATIVE 11/15/2020 1328   NITRITE NEGATIVE 11/15/2020 1328   LEUKOCYTESUR NEGATIVE 11/15/2020 1328   No results found for this or any previous visit (from the past 240 hour(s)).    Radiology Studies: No results found.  Scheduled Meds: . Chlorhexidine Gluconate Cloth  6 each Topical Daily  . cloNIDine  0.4 mg Oral Q6H  . docusate sodium  100 mg Oral BID  . feeding supplement  237 mL Oral TID BM  . folic acid  1 mg Oral Daily  . heparin injection (subcutaneous)  5,000 Units Subcutaneous Q8H  . insulin aspart  0-6 Units Subcutaneous Q4H  . mouth rinse  15 mL Mouth Rinse BID  . multivitamin with minerals  1 tablet Oral Daily  . mupirocin cream   Topical Daily  . oxyCODONE  5 mg Oral TID  . polyethylene glycol  17 g Oral Daily  . polyvinyl alcohol  1 drop Both Eyes TID  . QUEtiapine  50 mg Oral BID  . sodium chloride flush  10-40 mL Intracatheter Q12H  . thiamine  100 mg Oral Daily   Continuous Infusions: . sodium chloride 500 mL (11/20/20 2221)  . levofloxacin (LEVAQUIN) IV 750 mg (11/25/20 1201)     LOS: 15 days   Time spent: 35 minutes.  Tyrone Nine, MD [facility] [email or web address] 11/25/2020, 1:46 PM

## 2020-11-25 NOTE — Progress Notes (Signed)
Assumed care of pt in room 4NP08. Drowsy and oriented to person and place. Pt in no acute distress at this time. Will continue care of patient. VWilliams,RN.

## 2020-11-26 LAB — CBC
HCT: 29.3 % — ABNORMAL LOW (ref 39.0–52.0)
Hemoglobin: 9.7 g/dL — ABNORMAL LOW (ref 13.0–17.0)
MCH: 30.4 pg (ref 26.0–34.0)
MCHC: 33.1 g/dL (ref 30.0–36.0)
MCV: 91.8 fL (ref 80.0–100.0)
Platelets: 499 10*3/uL — ABNORMAL HIGH (ref 150–400)
RBC: 3.19 MIL/uL — ABNORMAL LOW (ref 4.22–5.81)
RDW: 12.2 % (ref 11.5–15.5)
WBC: 6.7 10*3/uL (ref 4.0–10.5)
nRBC: 0 % (ref 0.0–0.2)

## 2020-11-26 LAB — GLUCOSE, CAPILLARY
Glucose-Capillary: 127 mg/dL — ABNORMAL HIGH (ref 70–99)
Glucose-Capillary: 117 mg/dL — ABNORMAL HIGH (ref 70–99)
Glucose-Capillary: 114 mg/dL — ABNORMAL HIGH (ref 70–99)
Glucose-Capillary: 104 mg/dL — ABNORMAL HIGH (ref 70–99)
Glucose-Capillary: 112 mg/dL — ABNORMAL HIGH (ref 70–99)

## 2020-11-26 LAB — BASIC METABOLIC PANEL
Anion gap: 8 (ref 5–15)
BUN: 30 mg/dL — ABNORMAL HIGH (ref 6–20)
CO2: 25 mmol/L (ref 22–32)
Calcium: 9.2 mg/dL (ref 8.9–10.3)
Chloride: 104 mmol/L (ref 98–111)
Creatinine, Ser: 1.54 mg/dL — ABNORMAL HIGH (ref 0.61–1.24)
GFR, Estimated: >60 mL/min (ref >60)
Glucose, Bld: 138 mg/dL — ABNORMAL HIGH (ref 70–99)
Potassium: 3.7 mmol/L (ref 3.5–5.1)
Sodium: 137 mmol/L (ref 135–145)

## 2020-11-26 MED ORDER — CLONIDINE HCL 0.1 MG PO TABS
0.2000 mg | ORAL_TABLET | Freq: Four times a day (QID) | ORAL | Status: AC
  Administered 2020-11-28 (×4): 0.2 mg via ORAL
  Filled 2020-11-26 (×4): qty 2

## 2020-11-26 MED ORDER — CLONIDINE HCL 0.1 MG PO TABS
0.1000 mg | ORAL_TABLET | Freq: Two times a day (BID) | ORAL | Status: AC
  Administered 2020-11-30 (×2): 0.1 mg via ORAL
  Filled 2020-11-26 (×2): qty 1

## 2020-11-26 MED ORDER — CLONIDINE HCL 0.1 MG PO TABS
0.1000 mg | ORAL_TABLET | Freq: Four times a day (QID) | ORAL | Status: AC
  Administered 2020-11-29 – 2020-11-30 (×4): 0.1 mg via ORAL
  Filled 2020-11-26 (×4): qty 1

## 2020-11-26 MED ORDER — OXYCODONE HCL 5 MG PO TABS
5.0000 mg | ORAL_TABLET | Freq: Two times a day (BID) | ORAL | Status: DC
  Administered 2020-11-26 – 2020-12-07 (×22): 5 mg via ORAL
  Filled 2020-11-26 (×25): qty 1

## 2020-11-26 MED ORDER — QUETIAPINE FUMARATE 50 MG PO TABS
50.0000 mg | ORAL_TABLET | Freq: Two times a day (BID) | ORAL | Status: DC
  Administered 2020-11-26 – 2020-11-27 (×3): 50 mg via ORAL
  Filled 2020-11-26 (×2): qty 1

## 2020-11-26 MED ORDER — CLONIDINE HCL 0.1 MG PO TABS
0.3000 mg | ORAL_TABLET | Freq: Four times a day (QID) | ORAL | Status: AC
  Administered 2020-11-27 – 2020-11-28 (×4): 0.3 mg via ORAL
  Filled 2020-11-26 (×4): qty 3

## 2020-11-26 MED ORDER — CLONIDINE HCL 0.1 MG PO TABS
0.1000 mg | ORAL_TABLET | Freq: Every day | ORAL | Status: AC
  Administered 2020-12-01: 0.1 mg via ORAL
  Filled 2020-11-26: qty 1

## 2020-11-26 MED ORDER — QUETIAPINE FUMARATE 50 MG PO TABS: 75.0000 mg | ORAL_TABLET | Freq: Two times a day (BID) | ORAL | Status: DC

## 2020-11-26 NOTE — Consult Note (Signed)
WOC Nurse wound follow up Patient receiving care in [facility] 4N13, sitter in room. Wound type: Left ear former stage 3 from medical device Measurement: 2 cm x 0.2 cm  Wound bed: the above measurements represent a "line" of dried bloody exudate. No dressing over wound. Drainage (amount, consistency, odor) dried blood Periwound: intact Dressing procedure/placement/frequency: I have modified the order for the following care, Apply Bactroban to left ear Q day AFTER cleansing with soap and water. Helmut Muster, RN, MSN, CWOCN, CNS-BC, pager [phone]

## 2020-11-26 NOTE — Progress Notes (Addendum)
Physical Therapy Treatment Patient Details Name: Nathaniel Lynn MRN: [id number] DOB: February 01, 1991 Today's Date: 11/26/2020    History of Present Illness 30 yo male admitted 5/14 after found unresponsive with +UDS with seizure in ED and intubated with unknown down time. Extubatd 5/26. Acute hypoxic ischemic encephalopathy and rhabdomyolysis. 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 PMhx: drug abuse    PT Comments    Pt progressing well towards his physical therapy goals; he is motivated to participate and get out of bed. Pt requiring up to two person moderate assist for functional mobility. Able to take pivotal steps from bed to chair with a walker. Noted RLE weakness and decreased proprioception, thus resulting in limited weightbearing during gait. Focused on tactile/verbal facilitation to promote knee extension and activation with several sit to stands from chair. No R ankle dorsiflexion upon assessment. Continue to recommend comprehensive inpatient rehab (CIR) for post-acute therapy needs.   Follow Up Recommendations  CIR;Supervision/Assistance - 24 hour     Equipment Recommendations  Wheelchair (measurements PT);Rolling walker with 5" wheels;3in1 (PT);Wheelchair cushion (measurements PT)    Recommendations for Other Services       Precautions / Restrictions Precautions Precautions: Fall Precaution Comments: r side weakness, watch Sats Restrictions Weight Bearing Restrictions: No    Mobility  Bed Mobility Overal bed mobility: Needs Assistance Bed Mobility: Rolling;Supine to Sit Rolling: Supervision   Supine to sit: Mod assist;+2 for physical assistance;+2 for safety/equipment     General bed mobility comments: Pt able to progress to EOB with supervision but immediately with LOB at EOB. pt requires mod A to correct LOB. pt using BIL UE to stabilize at EOB    Transfers Overall transfer level: Needs assistance Equipment used: Rolling walker (2  wheeled) Transfers: Sit to/from Stand Sit to Stand: Mod assist;+2 safety/equipment;+2 physical assistance         General transfer comment: pt requires cues for R LE positioning posteriorly. Verbal/tactile cue to push knee backward, pt with tactile input at head for neck extension. Pt progressing with session with increased ability to engage muscles for increased postural alignment  Ambulation/Gait Ambulation/Gait assistance: Mod assist;+2 physical assistance Gait Distance (Feet): 3 Feet Assistive device: Rolling walker (2 wheeled) Gait Pattern/deviations: Step-to pattern;Decreased dorsiflexion - right;Decreased weight shift to right;Decreased stance time - right Gait velocity: decreased Gait velocity interpretation: <1.31 ft/sec, indicative of household ambulator General Gait Details: Pt taking pivotal steps from bed to chair, max multimodal cues for sequencing/direction, limited weightbearing through RLE   Stairs             Wheelchair Mobility    Modified Rankin (Stroke Patients Only)       Balance Overall balance assessment: Needs assistance Sitting-balance support: Bilateral upper extremity supported;Feet supported Sitting balance-Leahy Scale: Poor       Standing balance-Leahy Scale: Fair Standing balance comment: mod +2 (A)                            Cognition Arousal/Alertness: Awake/alert Behavior During Therapy: Flat affect Overall Cognitive Status: Impaired/Different from baseline Area of Impairment: Orientation;Attention;Memory;Following commands;Safety/judgement;Awareness;Problem solving                 Orientation Level: Disoriented to;Time;Situation Current Attention Level: Sustained Memory: Decreased recall of precautions;Decreased short-term memory Following Commands: Follows one step commands consistently;Follows multi-step commands with increased time Safety/Judgement: Decreased awareness of deficits Awareness:  Intellectual Problem Solving: Slow processing;Decreased initiation;Difficulty sequencing;Requires verbal cues;Requires  tactile cues General Comments: Pt impulsive at times with movement and needs cues to decrease speed or correct posture. Pt emotionally labile mulitple times during session states "y'all dont understand"      Exercises Other Exercises Other Exercises: reaching for object in all planes and in base of support Other Exercises: x3 sit to stands with tactile input at R knee for extension    General Comments General comments (skin integrity, edema, etc.): VSS BP 124/91 with movement      Pertinent Vitals/Pain Pain Assessment: Faces Faces Pain Scale: Hurts even more Pain Location: RLE pain / generalized Pain Descriptors / Indicators: Discomfort;Grimacing Pain Intervention(s): Monitored during session    Home Living                      Prior Function            PT Goals (current goals can now be found in the care plan section) Acute Rehab PT Goals Patient Stated Goal: to be able to move better Potential to Achieve Goals: Good Progress towards PT goals: Progressing toward goals    Frequency    Min 4X/week      PT Plan Frequency needs to be updated    Co-evaluation PT/OT/SLP Co-Evaluation/Treatment: Yes Reason for Co-Treatment: Complexity of the patient's impairments (multi-system involvement);For patient/therapist safety;To address functional/ADL transfers PT goals addressed during session: Mobility/safety with mobility OT goals addressed during session: ADL's and self-care;Proper use of Adaptive equipment and DME;Strengthening/ROM      AM-PAC PT "6 Clicks" Mobility   Outcome Measure  Help needed turning from your back to your side while in a flat bed without using bedrails?: A Little Help needed moving from lying on your back to sitting on the side of a flat bed without using bedrails?: A Lot Help needed moving to and from a bed to a chair  (including a wheelchair)?: A Lot Help needed standing up from a chair using your arms (e.g., wheelchair or bedside chair)?: A Lot Help needed to walk in hospital room?: A Lot Help needed climbing 3-5 steps with a railing? : Total 6 Click Score: 12    End of Session Equipment Utilized During Treatment: Gait belt Activity Tolerance: Patient tolerated treatment well Patient left: in chair;with call bell/phone within reach;with chair alarm set;with nursing/sitter in room Nurse Communication: Mobility status PT Visit Diagnosis: Other abnormalities of gait and mobility (R26.89);Difficulty in walking, not elsewhere classified (R26.2);Muscle weakness (generalized) (M62.81);Unsteadiness on feet (R26.81)     Time: 6195-0932 PT Time Calculation (min) (ACUTE ONLY): 26 min  Charges:  $Therapeutic Activity: 8-22 mins                     Lillia Pauls, PT, DPT Acute Rehabilitation Services Pager [phone] Office [phone]    Norval Morton 11/26/2020, 10:05 AM

## 2020-11-26 NOTE — Progress Notes (Signed)
Occupational Therapy Treatment Patient Details Name: Nathaniel Lynn MRN: [id number] DOB: [birth year] Today's Date: 11/26/2020    History of present illness 30 yo male admitted 5/14 after found unresponsive with +UDS with seizure in ED and intubated with unknown down time. Extubatd 5/26. Acute hypoxic ischemic encephalopathy and rhabdomyolysis. 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 PMhx: drug abuse   OT comments  Pt progressing toward goals so all goals updated this session. Pt requires (A) upon sitting due to LOB at EOB. Pt progressed to chair this session MOd (A) +2 and recommend RN staff use stedy to return to bed. Pt able to activate R LE but due to decreased proprioception requires increased (A). Pt lacks dorsiflexion with movement. Recommendation CIR    Follow Up Recommendations  CIR;Supervision/Assistance - 24 hour    Equipment Recommendations  Other (comment) (RW)    Recommendations for Other Services Rehab consult    Precautions / Restrictions Precautions Precautions: Fall Precaution Comments: r side weakness, watch Sats       Mobility Bed Mobility Overal bed mobility: Needs Assistance Bed Mobility: Rolling;Supine to Sit Rolling: Supervision   Supine to sit: Mod assist;+2 for physical assistance;+2 for safety/equipment     General bed mobility comments: Pt able to progress to EOB with supervision but immediately with LOB at EOB. pt requires mod A to correct LOB. pt using BIL UE to stabilize at EOB    Transfers Overall transfer level: Needs assistance Equipment used: Rolling walker (2 wheeled) Transfers: Sit to/from Stand Sit to Stand: Mod assist;+2 safety/equipment;+2 physical assistance         General transfer comment: pt requires cues to R LE positioning .verbal cue to push knee backward, pt with tactile input at head for neck extension. Pt progressing with session with increased ability to engage muscles for increased postural  alignment    Balance Overall balance assessment: Needs assistance Sitting-balance support: Bilateral upper extremity supported;Feet supported Sitting balance-Leahy Scale: Poor       Standing balance-Leahy Scale: Fair Standing balance comment: mod +2 (A)                           ADL either performed or assessed with clinical judgement   ADL Overall ADL's : Needs assistance/impaired Eating/Feeding: Minimal assistance;Sitting   Grooming: Sitting;Min guard Grooming Details (indicate cue type and reason): oral secretions without awarenes                             Functional mobility during ADLs: Moderate assistance;+2 for physical assistance;+2 for safety/equipment;Rolling walker General ADL Comments: pt decr awareness of deficits and decr proprioception of R side. pt static standing total +2 min (A)     Vision       Perception     Praxis      Cognition Arousal/Alertness: Awake/alert Behavior During Therapy: Flat affect Overall Cognitive Status: Impaired/Different from baseline Area of Impairment: Orientation;Attention;Memory;Following commands;Safety/judgement;Awareness;Problem solving                 Orientation Level: Disoriented to;Time;Situation Current Attention Level: Sustained Memory: Decreased recall of precautions;Decreased short-term memory Following Commands: Follows one step commands consistently;Follows multi-step commands with increased time Safety/Judgement: Decreased awareness of deficits Awareness: Intellectual Problem Solving: Slow processing;Decreased initiation;Difficulty sequencing;Requires verbal cues;Requires tactile cues General Comments: Pt impulsive at times with movement and needs cues to decrease speed or correct posture. Pt liable mulitple  times during session states "yall dont understand"        Exercises Exercises: Other exercises Other Exercises Other Exercises: reaching for object in all planes and in  base of support   Shoulder Instructions       General Comments VSS BP 124/91 with movement    Pertinent Vitals/ Pain       Pain Assessment: Faces Faces Pain Scale: Hurts even more Pain Location: BIL LE pain / generalized Pain Descriptors / Indicators: Discomfort;Grimacing Pain Intervention(s): Repositioned  Home Living                                          Prior Functioning/Environment              Frequency  Min 2X/week        Progress Toward Goals  OT Goals(current goals can now be found in the care plan section)  Progress towards OT goals: Progressing toward goals;Goals met and updated - see care plan  Acute Rehab OT Goals Patient Stated Goal: to be able to move better Time For Goal Achievement: 12/07/20 Potential to Achieve Goals: Good ADL Goals Pt Will Perform Grooming: sitting;with min guard assist Pt Will Perform Upper Body Bathing: sitting;with min assist Pt Will Transfer to Toilet: with +2 assist;stand pivot transfer;bedside commode;with min assist Additional ADL Goal #1: Pt will follow 2 step commands with 75% accuracy given increased time to optimize participation in ADLs. Additional ADL Goal #2: Pt will complete bed mobility with min guard assist and maintain sitting balance with no more than min assist as precursor to ADLs.  Plan Discharge plan remains appropriate    Co-evaluation    PT/OT/SLP Co-Evaluation/Treatment: Yes Reason for Co-Treatment: Complexity of the patient's impairments (multi-system involvement);For patient/therapist safety;To address functional/ADL transfers   OT goals addressed during session: ADL's and self-care;Proper use of Adaptive equipment and DME;Strengthening/ROM      AM-PAC OT "6 Clicks" Daily Activity     Outcome Measure   Help from another person eating meals?: A Lot Help from another person taking care of personal grooming?: A Lot Help from another person toileting, which includes using  toliet, bedpan, or urinal?: A Lot Help from another person bathing (including washing, rinsing, drying)?: A Lot Help from another person to put on and taking off regular upper body clothing?: A Lot Help from another person to put on and taking off regular lower body clothing?: A Lot 6 Click Score: 12    End of Session Equipment Utilized During Treatment: Gait belt;Rolling walker  OT Visit Diagnosis: Other abnormalities of gait and mobility (R26.89);Muscle weakness (generalized) (M62.81);Other symptoms and signs involving the nervous system (R29.898);Other symptoms and signs involving cognitive function;Cognitive communication deficit (R41.841);Pain Symptoms and signs involving cognitive functions: Cerebral infarction   Activity Tolerance Patient tolerated treatment well   Patient Left in chair;with call bell/phone within reach;with chair alarm set;with nursing/sitter in room Freight forwarder)   Nurse Communication Mobility status;Precautions        Time: [id number] OT Time Calculation (min): 26 min  Charges: OT General Charges $OT Visit: 1 Visit OT Treatments $Self Care/Home Management : 8-22 mins   Brynn, OTR/L  Acute Rehabilitation Services Pager: [phone] Office: [phone] .    Jeri Modena 11/26/2020, 9:35 AM

## 2020-11-26 NOTE — Progress Notes (Signed)
Family concerned for possible thrush on tongue....please assess.

## 2020-11-26 NOTE — Consult Note (Signed)
Physical Medicine and Rehabilitation Consult   Reason for Consult: Anoxic brain injury.  Referring Physician: Dr. Tonia Brooms.    HPI: Nathaniel Lynn is a 30 y.o. male with history of polysubstance abuse who was admitted on 11/10/20 after found unresponsive with AKI with rhabdomyolysis, had two episodes of seizure type activity with posturing while in ED at [facility] therefore transferred to [facility] for management/LT-EEG. Per reports, patient was getting high with girlfriend and UDS positive for amphetamines, opiates and cannabinoids. He was started on broad spectrum antibiotics and loaded with Keppra. CT head negative. . 2 D echo showed EF 50-55% with no wall abnormality and trivial MVR/TVR. He continued to be unresponsive and developed fevers but attempts at LP not successful.  EEG negative for seizures and AEDs not needed per neurology. He was covered with ceftriaxone, acyclovir and Vanc due to concerns of meningitis/encephalitis.   MRI brain done revealing restricted diffusion in bilateral perirolandic area question due to hypoxic ischemic injury, uremia or toxic metabolic encephalopathy. He has had issues with severe agitation once off sedation and propofol resumed. LP by IR not indicative of infection and antibiotics d/c 05/20.   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 Melynda Ripple. 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 As mentation improving, neurology signed off and respiratory status stable since extubation. He continues to be limited by weakness, poor safety awareness requiring sitters and cognitive deficits. CIR recommended due to functional decline.    Review of Systems  Constitutional: Negative  for chills and fever.  HENT: Negative for congestion.   Eyes: Negative for discharge and redness.  Respiratory: Negative for sputum production and shortness of breath.   Cardiovascular: Negative for chest pain.  Gastrointestinal: Negative for heartburn, nausea and vomiting.  Genitourinary:       Urinary incontinence with condom catheter  Musculoskeletal: Positive for myalgias.  Skin: Negative for itching.  Neurological: Positive for tremors and weakness.  Endo/Heme/Allergies: Negative.   Psychiatric/Behavioral: Positive for memory loss and substance abuse.      Past Medical History:  Diagnosis Date  . IVDU (intravenous drug user)     History reviewed. No pertinent surgical history.    History reviewed. No pertinent family history.    Social History:  has no history on file for tobacco use, alcohol use, and drug use.    Allergies  Allergen Reactions  . Penicillins     Pt states he is allergic to penicillin. He said he was told that but he does not know what the reaction is.    No medications prior to admission.    Home: Home Living Family/patient expects to be discharged to:: Private residence Living Arrangements: Parent Available Help at Discharge: Family,Available 24 hours/day Type of Home: Mobile home Home Access: Stairs to enter Entrance Stairs-Number of Steps: 2 Home Layout: One level Bathroom Shower/Tub: Walk-in shower,Tub/shower unit Bathroom Toilet: Handicapped height Home Equipment: None Additional Comments: sister Baird Lyons provided home information.  Functional History: Prior Function Level of Independence: Independent Functional Status:  Mobility: Bed Mobility Overal bed mobility: Needs Assistance Bed Mobility: Rolling,Supine to Sit Rolling: Supervision Supine to sit: Mod assist,+2 for physical assistance,+2 for safety/equipment General bed mobility comments: Pt able to progress to EOB  with supervision but immediately with LOB at EOB. pt requires  mod A to correct LOB. pt using BIL UE to stabilize at EOB Transfers Overall transfer level: Needs assistance Equipment used: Rolling walker (2 wheeled) Transfers: Sit to/from Stand Sit to Stand: Mod assist,+2 safety/equipment,+2 physical assistance Squat pivot transfers: Max assist,+2 physical assistance  Lateral/Scoot Transfers: Max assist,+2 physical assistance,+2 safety/equipment General transfer comment: pt requires cues for R LE positioning posteriorly. Verbal/tactile cue to push knee backward, pt with tactile input at head for neck extension. Pt progressing with session with increased ability to engage muscles for increased postural alignment Ambulation/Gait Ambulation/Gait assistance: Mod assist,+2 physical assistance Gait Distance (Feet): 3 Feet Assistive device: Rolling walker (2 wheeled) Gait Pattern/deviations: Step-to pattern,Decreased dorsiflexion - right,Decreased weight shift to right,Decreased stance time - right General Gait Details: Pt taking pivotal steps from bed to chair, max multimodal cues for sequencing/direction, limited weightbearing through RLE Gait velocity: decreased Gait velocity interpretation: <1.31 ft/sec, indicative of household ambulator    ADL: ADL Overall ADL's : Needs assistance/impaired Eating/Feeding: Minimal assistance,Sitting Grooming: Sitting,Min guard Grooming Details (indicate cue type and reason): oral secretions without awarenes Toileting - Clothing Manipulation Details (indicate cue type and reason): incontinent bowel and bladder in bed, total assist Functional mobility during ADLs: Moderate assistance,+2 for physical assistance,+2 for safety/equipment,Rolling walker General ADL Comments: pt decr awareness of deficits and decr proprioception of R side. pt static standing total +2 min (A)  Cognition: Cognition Overall Cognitive Status: Impaired/Different from baseline Orientation Level: Oriented to person,Oriented to place,Disoriented to  time,Disoriented to situation Cognition Arousal/Alertness: Awake/alert Behavior During Therapy: Flat affect Overall Cognitive Status: Impaired/Different from baseline Area of Impairment: Orientation,Attention,Memory,Following commands,Safety/judgement,Awareness,Problem solving Orientation Level: Disoriented to,Time,Situation Current Attention Level: Sustained Memory: Decreased recall of precautions,Decreased short-term memory Following Commands: Follows one step commands consistently,Follows multi-step commands with increased time Safety/Judgement: Decreased awareness of deficits Awareness: Intellectual Problem Solving: Slow processing,Decreased initiation,Difficulty sequencing,Requires verbal cues,Requires tactile cues General Comments: Pt impulsive at times with movement and needs cues to decrease speed or correct posture. Pt emotionally labile mulitple times during session states "y'all dont understand"  Blood pressure 127/77, pulse 86, temperature 98 F (36.7 C), temperature source Oral, resp. rate 16, weight 84.1 kg, SpO2 93 %. Physical Exam Constitutional:      Appearance: He is normal weight.  HENT:     Head: Normocephalic and atraumatic.     Mouth/Throat:     Mouth: Mucous membranes are moist.  Eyes:     Extraocular Movements: Extraocular movements intact.     Conjunctiva/sclera: Conjunctivae normal.     Pupils: Pupils are equal, round, and reactive to light.  Cardiovascular:     Rate and Rhythm: Normal rate and regular rhythm.     Heart sounds: Normal heart sounds.  Pulmonary:     Effort: No respiratory distress.     Breath sounds: Normal breath sounds. No stridor.  Abdominal:     General: Abdomen is flat. Bowel sounds are normal.     Palpations: Abdomen is soft.  Musculoskeletal:        General: No tenderness.     Right lower leg: No edema.     Left lower leg: No edema.  Skin:    General: Skin is warm and dry.  Neurological:     Mental Status: He is alert. He is  disoriented.     Motor: Tremor and abnormal muscle tone present.     Coordination: Coordination abnormal.     Gait: Gait abnormal.  Comments: Motor strength is 4 - bilateral deltoid bicep tricep grip, does not participate with lower extremity manual muscle testing.  He does briefly extend his left lower extremity  Psychiatric:        Mood and Affect: Mood normal.     Results for orders placed or performed during the hospital encounter of 11/10/20 (from the past 24 hour(s))  Glucose, capillary     Status: None   Collection Time: 11/25/20 11:49 AM  Result Value Ref Range   Glucose-Capillary 94 70 - 99 mg/dL  Glucose, capillary     Status: None   Collection Time: 11/25/20  3:43 PM  Result Value Ref Range   Glucose-Capillary 96 70 - 99 mg/dL   Comment 1 Notify RN    Comment 2 Document in Chart   Glucose, capillary     Status: Abnormal   Collection Time: 11/25/20  8:03 PM  Result Value Ref Range   Glucose-Capillary 156 (H) 70 - 99 mg/dL   Comment 1 Notify RN    Comment 2 Document in Chart   Glucose, capillary     Status: Abnormal   Collection Time: 11/25/20 11:36 PM  Result Value Ref Range   Glucose-Capillary 126 (H) 70 - 99 mg/dL   Comment 1 Notify RN    Comment 2 Document in Chart   Glucose, capillary     Status: Abnormal   Collection Time: 11/26/20  3:55 AM  Result Value Ref Range   Glucose-Capillary 127 (H) 70 - 99 mg/dL   Comment 1 Notify RN    Comment 2 Document in Chart   Basic metabolic panel     Status: Abnormal   Collection Time: 11/26/20  4:44 AM  Result Value Ref Range   Sodium 137 135 - 145 mmol/L   Potassium 3.7 3.5 - 5.1 mmol/L   Chloride 104 98 - 111 mmol/L   CO2 25 22 - 32 mmol/L   Glucose, Bld 138 (H) 70 - 99 mg/dL   BUN 30 (H) 6 - 20 mg/dL   Creatinine, Ser 1.611.54 (H) 0.61 - 1.24 mg/dL   Calcium 9.2 8.9 - 09.610.3 mg/dL   GFR, Estimated >04>60 >54>60 mL/min   Anion gap 8 5 - 15  CBC     Status: Abnormal   Collection Time: 11/26/20  4:44 AM  Result Value Ref  Range   WBC 6.7 4.0 - 10.5 K/uL   RBC 3.19 (L) 4.22 - 5.81 MIL/uL   Hemoglobin 9.7 (L) 13.0 - 17.0 g/dL   HCT 09.829.3 (L) 11.939.0 - 14.752.0 %   MCV 91.8 80.0 - 100.0 fL   MCH 30.4 26.0 - 34.0 pg   MCHC 33.1 30.0 - 36.0 g/dL   RDW 82.912.2 56.211.5 - 13.015.5 %   Platelets 499 (H) 150 - 400 K/uL   nRBC 0.0 0.0 - 0.2 %   No results found.  Assessment/Plan: Diagnosis: Anoxic encephalopathy with cognitive deficits mobility and self-care impairments 1. Does the need for close, 24 hr/day medical supervision in concert with the patient's rehab needs make it unreasonable for this patient to be served in a less intensive setting? Yes 2. Co-Morbidities requiring supervision/potential complications: Ataxia, history of polysubstance abuse, acute kidney injury with rhabdomyolysis 3. Due to bladder management, bowel management, safety, skin/wound care, disease management, medication administration, pain management and patient education, does the patient require 24 hr/day rehab nursing? Yes 4. Does the patient require coordinated care of a physician, rehab nurse, therapy disciplines of PT, OT, speech to address  physical and functional deficits in the context of the above medical diagnosis(es)? Yes Addressing deficits in the following areas: balance, endurance, locomotion, strength, transferring, bowel/bladder control, bathing, dressing, feeding, grooming, toileting and psychosocial support 5. Can the patient actively participate in an intensive therapy program of at least 3 hrs of therapy per day at least 5 days per week? Yes 6. The potential for patient to make measurable gains while on inpatient rehab is good 7. Anticipated functional outcomes upon discharge from inpatient rehab are supervision  with PT, supervision with OT, supervision with SLP. 8. Estimated rehab length of stay to reach the above functional goals is: 18 to 21 days 9. Anticipated discharge destination: Home 10. Overall Rehab/Functional Prognosis:  good  RECOMMENDATIONS: This patient's condition is appropriate for continued rehabilitative care in the following setting: CIR Patient has agreed to participate in recommended program. Yes Note that insurance prior authorization may be required for reimbursement for recommended care.  Comment:    Jacquelynn Cree, PA-C 11/26/2020   "I have personally performed a face to face diagnostic evaluation of this patient.  Additionally, I have reviewed and concur with the physician assistant's documentation above." Erick Colace M.D. [facility] Fellow Am Acad of Phys Med and Rehab Diplomate Am Board of Electrodiagnostic Med Fellow Am Board of Interventional Pain

## 2020-11-26 NOTE — Progress Notes (Signed)
PROGRESS NOTE  Nathaniel Lynn  TGG:[id number] DOB: May 09, 1991 DOA: 11/10/2020 PCP: Oneita Hurt, No   Brief Narrative Nathaniel Lynn is a 30 y.o. male with a history of narcotic abuse recently release from 2 year prison sentence who was found down reportedly "getting high" at girlfriend's house on 5/14, taken to [facility] for evaluation. Labs notable for +UDS (amphetamines, opiates, cannabinoids), Cr 2.33 (baseline 0.8-1.2), CK level >50K, LA 3.0. CT Head was negative for acute pathology. While in the ED, the patient was given Ativan x 2 for tonic-clonic seizure-like activity and was intubated shortly after seizure-like activity was noted. Concern for "posturing". Transferred to [facility] ICU for further evaluation and continuous EEG monitoring. Empiric antibiotics were given and withdrawn after unremarkable LP performed 5/19 with acyclovir stopped after negative HSV. AKI improved with fluids. Tracheal aspirate grew stenotrophomonas and flavobacterium for which 10-day course of levaquin was started on 5/21. No purposeful movements despite weaning sedation initially, though he eventually began following commands intermittently. EEG demonstrated nonspecific moderate diffuse encephalopathy without epileptiform discharges. MRI brain suggested global hypoxic ischemic injury. Precedex was given for agitation and patient extubated 5/25. The patient was transferred to hospitalist service 5/28 still requiring prn sedation for agitation and having post-ATN diuresis and hypernatremia which has improved.   Assessment & Plan: Active Problems:   Status epilepticus (HCC)   Acute respiratory failure with hypoxemia (HCC)   Encephalopathy   Pressure injury of skin   AKI (acute kidney injury) (HCC)   Aspiration pneumonia (HCC)   Hypernatremia  Acute toxic, metabolic encephalopathy and concern for now chronic hypoxic encephalopathy: UDS positive for amphetamines, THC, opiates. CSF Cx unremarkable, neg for viral or bacterial process,  HSV neg 5/21. EEG with moderate nonspecific slowing, no epileptiform discharges. MRI Brain 5/16 suggestive of nonspecific encephalopathy with motion-degraded repeat MRI 5/24 consistent with expected evolution of hypoxic injury without other new or progressive findings.  - Continue prn haldol. DC IV fentanyl. Pt appears drowsy. Would use sitter in preference to pharmacologic treatments of impulsivity. - Continue seroquel 50mg  po BID, can give prn haldol as well. - Oxycodone scheduled w/bowel regimen. Decrease to 5mg  po BID.  - Continue clonidine adjunct. Normotensive on this. - Empiric thiamine, folate, MVM  Acute hypoxic respiratory failure: Due to encephalopathy, now extubated. Also due to Stenotrophomonas and flavobacterium indologenes tracheobronchitis. - Off oxygen. - Continue levaquin x10 days, stop date 5/31.   AKI due to ATN: Improving.  - Allow post-ATN autodiuresis, another >5L UOP over past 24 hours.   Hypernatremia: Resolved.   Rhabdomyolysis: Improving.   Hypokalemia: Supplement today and monitor.   Demand myocardial ischemia: Troponin elevation more consistent with rhabdomyolysis, renal dysfunction than primary ACS.   HTN:  - Anticipate improvement with improvement in agitation and hypervolemia.  - Clonidine as above  LFT elevation: Due to toxins and rhabdomyolysis:  - Continue monitoring for improvement  IVDU, Polysubstance abuse:  - Tx as above - NR HIV - No vegetations on TTE  Lactic acidosis: Resolved.  Moderate protein calorie malnutrition:  - Supplement as able.  LUE superficial thrombosis, RUE/ RLE edema: Improving edema with diuresis. No DVT on venous U/S of all extremities.  - Continue prophylactic heparin  RN Pressure Injury Documentation: Pressure Injury 11/20/20 Ear Left;Posterior Stage 3 -  Full thickness tissue loss. Subcutaneous fat may be visible but bone, tendon or muscle are NOT exposed. (Active)  11/20/20 1500  Location: Ear  Location  Orientation: Left;Posterior  Staging: Stage 3 -  Full thickness tissue loss.  Subcutaneous fat may be visible but bone, tendon or muscle are NOT exposed.  Wound Description (Comments):   Present on Admission: No    Obesity: Estimated body mass index is 26.6 kg/m as calculated from the following:   Height as of an earlier encounter on 11/10/20: 5\' 10"  (1.778 m).   Weight as of this encounter: 84.1 kg.  DVT prophylaxis: Heparin Code Status: Full Family Communication: None at bedside Disposition Plan:  Status is: Inpatient  Remains inpatient appropriate because:Hemodynamically unstable, Persistent severe electrolyte disturbances, Altered mental status and Inpatient level of care appropriate due to severity of illness  Dispo: The patient is from: Home              Anticipated d/c is to: TBD, CIR consulted              Patient currently is not medically stable to d/c.   Difficult to place patient No  Consultants:   PCCM  Procedures:   ETT  Antimicrobials:  Levaquin   Subjective: Drowsy last night and this morning. No ativan given in several days. Pt without complaint, continues to appear drowsy every time I see him.   Objective: Vitals:   11/26/20 0227 11/26/20 0300 11/26/20 0406 11/26/20 0718  BP:   130/78 129/89  Pulse:   93 96  Resp:   18 15  Temp:   98.1 F (36.7 C) 97.9 F (36.6 C)  TempSrc:   Oral Oral  SpO2:  93% 95% 93%  Weight: 84.1 kg       Intake/Output Summary (Last 24 hours) at 11/26/2020 0856 Last data filed at 11/26/2020 0431 Gross per 24 hour  Intake 1440 ml  Output 5425 ml  Net -3985 ml   Filed Weights   11/18/20 0400 11/23/20 0419 11/26/20 0227  Weight: 112.9 kg 112.9 kg 84.1 kg   Gen: 30 y.o. male in no distress Pulm: Nonlabored breathing room air. Clear. CV: Regular rate and rhythm. No murmur, rub, or gallop. No JVD, no significant edema (improved). GI: Abdomen soft, non-tender, non-distended, with normoactive bowel sounds.  Ext: Warm, no  deformities Skin: No new rashes, lesions or ulcers on visualized skin. Scars as previously mentioned are stable on extremities. Neuro: Drowsy but rousable, mumbles with intermittent coherence. Moves extremities. Psych: Judgement and insight appear impaired. Calm.   Data Reviewed: I have personally reviewed following labs and imaging studies  CBC: Recent Labs  Lab 11/20/20 0517 11/22/20 0319 11/24/20 0352 11/26/20 0444  WBC 9.3 9.3 8.1 6.7  HGB 8.8* 8.8* 8.8* 9.7*  HCT 28.6* 28.6* 27.3* 29.3*  MCV 100.4* 99.3 95.8 91.8  PLT 367 418* 476* 499*   Basic Metabolic Panel: Recent Labs  Lab 11/22/20 0319 11/23/20 0439 11/24/20 0352 11/25/20 0330 11/26/20 0444  NA 149* 151* 144 139 137  K 3.7 3.6 3.9 3.4* 3.7  CL 118* 117* 111 108 104  CO2 25 25 25 24 25   GLUCOSE 105* 109* 99 102* 138*  BUN 48* 48* 38* 30* 30*  CREATININE 1.84* 1.85* 1.67* 1.59* 1.54*  CALCIUM 8.3* 8.9 8.8* 8.8* 9.2  MG 2.4  --  2.1  --   --   PHOS 4.4  --   --   --   --    GFR: Estimated Creatinine Clearance: 73.7 mL/min (A) (by C-G formula based on SCr of 1.54 mg/dL (H)). Liver Function Tests: Recent Labs  Lab 11/21/20 0854 11/25/20 0330  AST 130* 30  ALT 166* 60*  ALKPHOS 73 55  BILITOT 0.8 0.6  PROT 5.9* 5.6*  ALBUMIN 2.4* 2.6*   No results for input(s): LIPASE, AMYLASE in the last 168 hours. No results for input(s): AMMONIA in the last 168 hours. Coagulation Profile: No results for input(s): INR, PROTIME in the last 168 hours. Cardiac Enzymes: Recent Labs  Lab 11/21/20 0854  CKTOTAL 819*   BNP (last 3 results) No results for input(s): PROBNP in the last 8760 hours. HbA1C: No results for input(s): HGBA1C in the last 72 hours. CBG: Recent Labs  Lab 11/25/20 1149 11/25/20 1543 11/25/20 2003 11/25/20 2336 11/26/20 0355  GLUCAP 94 96 156* 126* 127*   Lipid Profile: No results for input(s): CHOL, HDL, LDLCALC, TRIG, CHOLHDL, LDLDIRECT in the last 72 hours. Thyroid Function  Tests: No results for input(s): TSH, T4TOTAL, FREET4, T3FREE, THYROIDAB in the last 72 hours. Anemia Panel: No results for input(s): VITAMINB12, FOLATE, FERRITIN, TIBC, IRON, RETICCTPCT in the last 72 hours. Urine analysis:    Component Value Date/Time   COLORURINE YELLOW 11/15/2020 1328   APPEARANCEUR HAZY (A) 11/15/2020 1328   LABSPEC 1.012 11/15/2020 1328   PHURINE 5.0 11/15/2020 1328   GLUCOSEU NEGATIVE 11/15/2020 1328   HGBUR SMALL (A) 11/15/2020 1328   BILIRUBINUR NEGATIVE 11/15/2020 1328   KETONESUR NEGATIVE 11/15/2020 1328   PROTEINUR NEGATIVE 11/15/2020 1328   NITRITE NEGATIVE 11/15/2020 1328   LEUKOCYTESUR NEGATIVE 11/15/2020 1328   No results found for this or any previous visit (from the past 240 hour(s)).    Radiology Studies: No results found.  Scheduled Meds: . Chlorhexidine Gluconate Cloth  6 each Topical Daily  . cloNIDine  0.4 mg Oral Q6H  . docusate sodium  100 mg Oral BID  . feeding supplement  237 mL Oral TID BM  . folic acid  1 mg Oral Daily  . heparin injection (subcutaneous)  5,000 Units Subcutaneous Q8H  . insulin aspart  0-6 Units Subcutaneous Q4H  . mouth rinse  15 mL Mouth Rinse BID  . multivitamin with minerals  1 tablet Oral Daily  . mupirocin cream   Topical Daily  . oxyCODONE  5 mg Oral TID  . polyethylene glycol  17 g Oral Daily  . polyvinyl alcohol  1 drop Both Eyes TID  . QUEtiapine  50 mg Oral BID  . sodium chloride flush  10-40 mL Intracatheter Q12H  . thiamine  100 mg Oral Daily   Continuous Infusions: . sodium chloride 500 mL (11/20/20 2221)  . levofloxacin (LEVAQUIN) IV 750 mg (11/25/20 1201)     LOS: 16 days   Time spent: 35 minutes.  Tyrone Nine, MD [facility] [email or web address] 11/26/2020, 8:56 AM

## 2020-11-26 NOTE — Progress Notes (Signed)
Inpatient Rehab Admissions Coordinator:   Pt. Mildly agitated this morning, sitter in room. Does not appear ready for CIR at this time, but I will continue to follow for potential admit pending medical readiness and bed availability.  I also reached out to financial counselor regarding status of medicaid screening.   Megan Salon, MS, CCC-SLP Rehab Admissions Coordinator  [phone] (celll) [phone] (office)

## 2020-11-27 LAB — COMPREHENSIVE METABOLIC PANEL
ALT: 46 U/L — ABNORMAL HIGH (ref 0–44)
AST: 26 U/L (ref 15–41)
Albumin: 3.3 g/dL — ABNORMAL LOW (ref 3.5–5.0)
Alkaline Phosphatase: 60 U/L (ref 38–126)
Anion gap: 9 (ref 5–15)
BUN: 26 mg/dL — ABNORMAL HIGH (ref 6–20)
CO2: 24 mmol/L (ref 22–32)
Calcium: 9.5 mg/dL (ref 8.9–10.3)
Chloride: 104 mmol/L (ref 98–111)
Creatinine, Ser: 1.4 mg/dL — ABNORMAL HIGH (ref 0.61–1.24)
GFR, Estimated: >60 mL/min (ref >60)
Glucose, Bld: 117 mg/dL — ABNORMAL HIGH (ref 70–99)
Potassium: 3.7 mmol/L (ref 3.5–5.1)
Sodium: 137 mmol/L (ref 135–145)
Total Bilirubin: 0.6 mg/dL (ref 0.3–1.2)
Total Protein: 7.2 g/dL (ref 6.5–8.1)

## 2020-11-27 LAB — GLUCOSE, CAPILLARY
Glucose-Capillary: 109 mg/dL — ABNORMAL HIGH (ref 70–99)
Glucose-Capillary: 102 mg/dL — ABNORMAL HIGH (ref 70–99)
Glucose-Capillary: 107 mg/dL — ABNORMAL HIGH (ref 70–99)
Glucose-Capillary: 177 mg/dL — ABNORMAL HIGH (ref 70–99)
Glucose-Capillary: 115 mg/dL — ABNORMAL HIGH (ref 70–99)

## 2020-11-27 LAB — CK: Total CK: 159 U/L (ref 49–397)

## 2020-11-27 MED ORDER — QUETIAPINE FUMARATE 100 MG PO TABS
100.0000 mg | ORAL_TABLET | Freq: Every day | ORAL | Status: DC
  Administered 2020-11-27 – 2020-12-06 (×10): 100 mg via ORAL
  Filled 2020-11-27 (×11): qty 1

## 2020-11-27 MED ORDER — ENOXAPARIN SODIUM 40 MG/0.4ML IJ SOSY
40.0000 mg | PREFILLED_SYRINGE | INTRAMUSCULAR | Status: DC
  Administered 2020-11-27 – 2020-12-06 (×5): 40 mg via SUBCUTANEOUS
  Filled 2020-11-27 (×11): qty 0.4

## 2020-11-27 MED ORDER — PROSOURCE PLUS PO LIQD
30.0000 mL | Freq: Two times a day (BID) | ORAL | Status: DC
  Administered 2020-11-28 – 2020-12-06 (×7): 30 mL via ORAL
  Filled 2020-11-27 (×15): qty 30

## 2020-11-27 MED ORDER — BOOST / RESOURCE BREEZE PO LIQD CUSTOM
1.0000 | Freq: Three times a day (TID) | ORAL | Status: DC
  Administered 2020-11-27 – 2020-12-07 (×15): 1 via ORAL

## 2020-11-27 MED ORDER — QUETIAPINE FUMARATE 50 MG PO TABS
50.0000 mg | ORAL_TABLET | Freq: Every morning | ORAL | Status: DC
  Administered 2020-11-28 – 2020-12-07 (×10): 50 mg via ORAL
  Filled 2020-11-27 (×10): qty 1

## 2020-11-27 NOTE — Progress Notes (Signed)
Inpatient Rehab Admissions Coordinator:   I do not have a bed for this pt. On CIR today, note that Pt. Continues to require a sitter, which cannot be accommodated on CIR. I reached out to acute MD to see if Pt. Is to need a sitter long term.   Megan Salon, MS, CCC-SLP Rehab Admissions Coordinator  [phone] (celll) [phone] (office)

## 2020-11-27 NOTE — Progress Notes (Signed)
Nutrition Follow-up  DOCUMENTATION CODES:   Not applicable  INTERVENTION:  -d/c Ensure, pt does not like -Boost Breeze po TID, each supplement provides 250 kcal and 9 grams of protein -40ml Prosource Plus po BID, each supplement provides 100 kcals and 15 grams of protein -MVI with minerals daily -Continue Magic cup TID with meals, each supplement provides 290 kcal and 9 grams of protein -Continue to encourage adequate PO intake  NUTRITION DIAGNOSIS:   Inadequate oral intake related to acute illness as evidenced by meal completion < 50%.  ongoing  GOAL:   Patient will meet greater than or equal to 90% of their needs  progressing  MONITOR:   PO intake,Supplement acceptance  REASON FOR ASSESSMENT:   Consult Enteral/tube feeding initiation and management  ASSESSMENT:   Pt with PMH of polysubstance abuse recently released from prison admitted after being found down admitted with acute metabolic encephalopathy and AKI due to rhabdomyolysis.  5/25 extubated 5/26 diet advanced  5/28 tx to hospitalist service  Note hopeful plan for pt to d/c to CIR, but currently still requiring safety sitter.   Pt provided no responses to RD questions; too lethargic/drowsy. Discussed pt with RN who reports pt has not had the best appetite and has not enjoyed Ensure, but will drink most of it if heavily encouraged. Will trial other supplements in hopes of increasing pt's acceptance and therefore intake.   PO Intake: 0-50% x last 8 recorded meals (22.5% average meal intake)  UOP: x24 hours  Medications:  . Chlorhexidine Gluconate Cloth  6 each Topical Daily  . cloNIDine  0.3 mg Oral Q6H   Followed by  . [START ON 11/28/2020] cloNIDine  0.2 mg Oral Q6H   Followed by  . [START ON 11/29/2020] cloNIDine  0.1 mg Oral Q6H   Followed by  . [START ON 11/30/2020] cloNIDine  0.1 mg Oral BID   Followed by  . [START ON 12/01/2020] cloNIDine  0.1 mg Oral Daily  . docusate sodium  100 mg Oral BID   . feeding supplement  237 mL Oral TID BM  . folic acid  1 mg Oral Daily  . heparin injection (subcutaneous)  5,000 Units Subcutaneous Q8H  . insulin aspart  0-6 Units Subcutaneous Q4H  . mouth rinse  15 mL Mouth Rinse BID  . multivitamin with minerals  1 tablet Oral Daily  . mupirocin cream   Topical Daily  . oxyCODONE  5 mg Oral BID  . polyethylene glycol  17 g Oral Daily  . polyvinyl alcohol  1 drop Both Eyes TID  . QUEtiapine  50 mg Oral BID  . sodium chloride flush  10-40 mL Intracatheter Q12H  . thiamine  100 mg Oral Daily   Labs:  Recent Labs  Lab 11/22/20 0319 11/23/20 0439 11/24/20 0352 11/25/20 0330 11/26/20 0444 11/27/20 0403  NA 149*   < > 144 139 137 137  K 3.7   < > 3.9 3.4* 3.7 3.7  CL 118*   < > 111 108 104 104  CO2 25   < > 25 24 25 24   BUN 48*   < > 38* 30* 30* 26*  CREATININE 1.84*   < > 1.67* 1.59* 1.54* 1.40*  CALCIUM 8.3*   < > 8.8* 8.8* 9.2 9.5  MG 2.4  --  2.1  --   --   --   PHOS 4.4  --   --   --   --   --   GLUCOSE  105*   < > 99 102* 138* 117*   < > = values in this interval not displayed.    Diet Order:   Diet Order            DIET SOFT Room service appropriate? Yes; Fluid consistency: Thin  Diet effective now                 EDUCATION NEEDS:   No education needs have been identified at this time  Skin:  Skin Assessment: Skin Integrity Issues: Skin Integrity Issues:: Stage III Stage III: ear  Last BM:  5/28 type 7  Height:   Ht Readings from Last 1 Encounters:  11/10/20 5\' 10"  (1.778 m)    Weight:   Wt Readings from Last 1 Encounters:  11/27/20 80.4 kg   BMI:  Body mass index is 25.43 kg/m.  Estimated Nutritional Needs:   Kcal:  2300-2500  Protein:  120-135 grams  Fluid:  >2.3 L/day    11/29/20, MS, RD, LDN RD pager number and weekend/on-call pager number located in Amion.

## 2020-11-27 NOTE — Progress Notes (Signed)
Tele called with concerns of tachycardia in the 130's and ST elevation in the V leads. Tech is getting vitals and CBG now. Pt is NOT in distress. Would you like a 12 lead or cardiac enzyme lab draw? Pt is in for overdose. I believe the downtime was unknown. Orientation status is unchanged.   MD notified and advised no further workup needed because ST elevation is inconsequential.

## 2020-11-27 NOTE — Progress Notes (Addendum)
Occupational Therapy Treatment Patient Details Name: Nathaniel Lynn MRN: [id number] DOB: Jan 24, 1991 Today's Date: 11/27/2020    History of present Illness Pt is a 30 y.o. male admitted to [facility] on 11/10/20 after being found down reportedly "getting high" at girlfriend's house; (+) UDS (amphetamines, opiates, cannabinoids). Head CT negative for acute pathology. Pt with tonic-clonic seizure-like activity in ED, intubated with transfer to [facility] ICU. Unremarkable LP 5/19. EEG demonstrated nonspecific moderate diffuse encephalopathy without epileptiform discharges. 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 ETT 5/14-5/25. Course complicated by intermittent agitation. Other workup for AKI, rhabdomyolysis, HTN. PMH includes substance abuse.   OT comments  Pt progressing towards established OT goals. Pt performing sit<>stand with Min-Mod A +2 and presenting with posterior lean; able to correct with tactile and verbal cues. Pt performing functional mobility with Mod A +2 and RW. Pt continues to present with decreased strength, balance, and cognition. Benefits from simple, direct cues and quiet environment. Continue to highly recommend dc to CIR and will continue to follow acutely as admitted.    Follow Up Recommendations  CIR;Supervision/Assistance - 24 hour    Equipment Recommendations  Other (comment) (RW)    Recommendations for Other Services Rehab consult    Precautions / Restrictions Precautions Precautions: Fall Precaution Comments: r side weakness, watch Sats       Mobility Bed Mobility Overal bed mobility: Needs Assistance Bed Mobility: Supine to Sit Rolling: Supervision   Supine to sit: Mod assist;+2 for physical assistance;+2 for safety/equipment     General bed mobility comments: Pt able to progress to EOB with supervision but immediately with LOB at EOB. pt requires mod A to correct LOB. pt using BIL UE to stabilize at  EOB    Transfers Overall transfer level: Needs assistance Equipment used: Rolling walker (2 wheeled) Transfers: Sit to/from Stand Sit to Stand: +2 safety/equipment;+2 physical assistance;Min assist;Mod assist         General transfer comment: Min A for power up and then Mod A for gaining balance; posterior lean.    Balance Overall balance assessment: Needs assistance Sitting-balance support: Bilateral upper extremity supported;Feet supported Sitting balance-Leahy Scale: Fair Sitting balance - Comments: Able to maintain sitting balance at EOB     Standing balance-Leahy Scale: Poor Standing balance comment: Reliant on external support for posterior lean and balance                           ADL either performed or assessed with clinical judgement   ADL Overall ADL's : Needs assistance/impaired Eating/Feeding: Minimal assistance;Sitting Eating/Feeding Details (indicate cue type and reason): Min hand over hand to maintain grasp on cup while R hand pull tab to open sweet tea.                     Toilet Transfer: +2 for physical assistance;Ambulation;Moderate assistance (simulated to recliner)           Functional mobility during ADLs: Moderate assistance;+2 for physical assistance;+2 for safety/equipment;Rolling walker General ADL Comments: Pt performing sit<>stand and then mobility to doorway with Mod A +2 and cues for Rw management and sequencing. As pt fatigued, he became more anxious.     Vision       Perception     Praxis      Cognition Arousal/Alertness: Awake/alert Behavior During Therapy: Flat affect Overall Cognitive Status: Impaired/Different from baseline Area of Impairment: Orientation;Attention;Memory;Following commands;Safety/judgement;Awareness;Problem solving  Orientation Level: Disoriented to;Time;Situation Current Attention Level: Sustained Memory: Decreased recall of precautions;Decreased short-term  memory Following Commands: Follows one step commands consistently;Follows multi-step commands with increased time Safety/Judgement: Decreased awareness of deficits Awareness: Intellectual Problem Solving: Slow processing;Decreased initiation;Difficulty sequencing;Requires verbal cues;Requires tactile cues General Comments: Pt presenting with decreased awareness, attention, and safety. Requiring increased time. Cues for mummbling and pt speaking more clearly. Becoming anxious when feeling that something is too difficult.        Exercises Exercises: Other exercises Other Exercises Other Exercises: x2 sit to stands with tactile input at R knee for extension   Shoulder Instructions       General Comments VSS    Pertinent Vitals/ Pain       Pain Assessment: Faces Faces Pain Scale: Hurts even more Pain Location: RLE pain / generalized Pain Descriptors / Indicators: Discomfort;Grimacing Pain Intervention(s): Monitored during session;Limited activity within patient's tolerance;Repositioned  Home Living                                          Prior Functioning/Environment              Frequency  Min 2X/week        Progress Toward Goals  OT Goals(current goals can now be found in the care plan section)  Progress towards OT goals: Progressing toward goals  Acute Rehab OT Goals Patient Stated Goal: to be able to move better Time For Goal Achievement: 12/07/20 Potential to Achieve Goals: Good ADL Goals Pt Will Perform Grooming: sitting;with min guard assist Pt Will Perform Upper Body Bathing: sitting;with min assist Pt Will Transfer to Toilet: with +2 assist;stand pivot transfer;bedside commode;with min assist Additional ADL Goal #1: Pt will follow 2 step commands with 75% accuracy given increased time to optimize participation in ADLs. Additional ADL Goal #2: Pt will complete bed mobility with min guard assist and maintain sitting balance with no more  than min assist as precursor to ADLs.  Plan Discharge plan remains appropriate    Co-evaluation    PT/OT/SLP Co-Evaluation/Treatment: Yes Reason for Co-Treatment: For patient/therapist safety;To address functional/ADL transfers   OT goals addressed during session: ADL's and self-care      AM-PAC OT "6 Clicks" Daily Activity     Outcome Measure   Help from another person eating meals?: A Lot Help from another person taking care of personal grooming?: A Lot Help from another person toileting, which includes using toliet, bedpan, or urinal?: A Lot Help from another person bathing (including washing, rinsing, drying)?: A Lot Help from another person to put on and taking off regular upper body clothing?: A Lot Help from another person to put on and taking off regular lower body clothing?: A Lot 6 Click Score: 12    End of Session Equipment Utilized During Treatment: Gait belt;Rolling walker  OT Visit Diagnosis: Other abnormalities of gait and mobility (R26.89);Muscle weakness (generalized) (M62.81);Other symptoms and signs involving the nervous system (R29.898);Other symptoms and signs involving cognitive function;Cognitive communication deficit (R41.841);Pain Symptoms and signs involving cognitive functions: Cerebral infarction Pain - part of body:  (generalized)   Activity Tolerance Patient tolerated treatment well   Patient Left in chair;with call bell/phone within reach;with chair alarm set;with nursing/sitter in room Psychiatrist)   Nurse Communication Mobility status;Precautions        Time: 8416-6063 OT Time Calculation (min): 26 min  Charges: OT General Charges $OT  Visit: 1 Visit OT Treatments $Therapeutic Activity: 8-22 mins  Merced Brougham MSOT, OTR/L Acute Rehab Pager: [phone] Office: [phone]   Theodoro Grist [place]avin 11/27/2020, 5:11 PM

## 2020-11-27 NOTE — Plan of Care (Signed)
°  Problem: Safety: °Goal: Non-violent Restraint(s) °Outcome: Progressing °  °Problem: Education: °Goal: Knowledge of General Education information will improve °Description: Including pain rating scale, medication(s)/side effects and non-pharmacologic comfort measures °Outcome: Progressing °  °Problem: Health Behavior/Discharge Planning: °Goal: Ability to manage health-related needs will improve °Outcome: Progressing °  °Problem: Clinical Measurements: °Goal: Ability to maintain clinical measurements within normal limits will improve °Outcome: Progressing °Goal: Will remain free from infection °Outcome: Progressing °Goal: Diagnostic test results will improve °Outcome: Progressing °Goal: Respiratory complications will improve °Outcome: Progressing °Goal: Cardiovascular complication will be avoided °Outcome: Progressing °  °Problem: Activity: °Goal: Risk for activity intolerance will decrease °Outcome: Progressing °  °Problem: Nutrition: °Goal: Adequate nutrition will be maintained °Outcome: Progressing °  °Problem: Coping: °Goal: Level of anxiety will decrease °Outcome: Progressing °  °Problem: Pain Managment: °Goal: General experience of comfort will improve °Outcome: Progressing °  °Problem: Safety: °Goal: Ability to remain free from injury will improve °Outcome: Progressing °  °Problem: Skin Integrity: °Goal: Risk for impaired skin integrity will decrease °Outcome: Progressing °  °

## 2020-11-27 NOTE — Progress Notes (Signed)
Physical Therapy Treatment Patient Details Name: Nathaniel Lynn MRN: [id number] DOB: 1991/06/23 Today's Date: 11/27/2020    History of Present Illness Pt is a 30 y.o. male admitted to [facility] on 11/10/20 after being found down reportedly "getting high" at girlfriend's house; (+) UDS (amphetamines, opiates, cannabinoids). Head CT negative for acute pathology. Pt with tonic-clonic seizure-like activity in ED, intubated with transfer to [facility] ICU. Unremarkable LP 5/19. EEG demonstrated nonspecific moderate diffuse encephalopathy without epileptiform discharges. 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 ETT 5/14-5/25. Course complicated by intermittent agitation. Other workup for AKI, rhabdomyolysis, HTN. PMH includes substance abuse.   PT Comments    Pt progressing with mobility. Today's session focused on progression of transfer and gait training, pt requiring mod-maxA+2 for ambulation. Pt with posterior lean, able to correct with tactile and verbal cues. Pt continues to lack R foot dorsiflexion, requiring external assist to progress RLE during gait. Pt remains limited by muscle weakness, poor balance strategies/postural reactions and impaired cognition, including decreased attention, poor awareness and difficulty problem solving. Pt pleasant and cooperative throughout session; benefits from quiet environment and simple cues. Pt would benefit from intensive CIR-level therapies to maximize functional mobility and independence.   Follow Up Recommendations  CIR;Supervision/Assistance - 24 hour     Equipment Recommendations  Wheelchair (measurements PT);Rolling walker with 5" wheels;3in1 (PT);Wheelchair cushion (measurements PT)    Recommendations for Other Services Rehab consult     Precautions / Restrictions Precautions Precautions: Fall;Other (comment) Precaution Comments: R-side weakness Restrictions Weight Bearing Restrictions: No     Mobility  Bed Mobility Overal bed mobility: Needs Assistance Bed Mobility: Supine to Sit Rolling: Supervision   Supine to sit: Mod assist;+2 for safety/equipment     General bed mobility comments: Pt able to progress to EOB with supervision, increased time and effort with managing BLEs and trunk support; LOB at EOB requiring modA to prevent fall backwards    Transfers Overall transfer level: Needs assistance Equipment used: None Transfers: Sit to/from Stand Sit to Stand: Mod assist;Min assist;+2 physical assistance;+2 safety/equipment         General transfer comment: Initial standing attempt with BLEs braced against EOB and uncontrolled descent back to sitting EOB, additional trial without DME requiring modA for trunk elevation and stability; 2x sit<>stand from recliner requiring minA for trunk elevation and consistent modA to maintain static standing balance due to posterior lean and RLE instability  Ambulation/Gait Ambulation/Gait assistance: Mod assist;+2 physical assistance;+2 safety/equipment;Max assist Gait Distance (Feet): 16 Feet Assistive device: 1 person hand held assist;Rolling walker (2 wheeled) Gait Pattern/deviations: Step-to pattern;Decreased dorsiflexion - right;Decreased step length - right;Decreased weight shift to right;Trunk flexed;Leaning posteriorly Gait velocity: decreased   General Gait Details: Initial steps without DME, pt reliant on HHA and modA+2 to maintain balance, weight shift and assist with R foot progression; pt requesting use of RW, requiring initial modA for stability, progressing to mod-maxA+2 with increaseing posterior lean and increased difficulty progressing R foot, eventually requiring maxA for R foot step progression   Stairs             Wheelchair Mobility    Modified Rankin (Stroke Patients Only) Modified Rankin (Stroke Patients Only) Pre-Morbid Rankin Score: No symptoms Modified Rankin: Moderately severe disability      Balance Overall balance assessment: Needs assistance Sitting-balance support: Bilateral upper extremity supported;Feet supported;No upper extremity supported Sitting balance-Leahy Scale: Fair Sitting balance - Comments: Able to maintain sitting balance at EOB  Standing balance-Leahy Scale: Poor Standing balance comment: Reliant on external assist with posterior and lateral lean                            Cognition Arousal/Alertness: Awake/alert Behavior During Therapy: Flat affect Overall Cognitive Status: Impaired/Different from baseline Area of Impairment: Orientation;Attention;Memory;Following commands;Safety/judgement;Awareness;Problem solving                 Orientation Level: Disoriented to;Time;Situation Current Attention Level: Sustained Memory: Decreased recall of precautions;Decreased short-term memory Following Commands: Follows one step commands consistently;Follows multi-step commands with increased time;Follows one step commands with increased time Safety/Judgement: Decreased awareness of deficits;Decreased awareness of safety Awareness: Intellectual Problem Solving: Slow processing;Decreased initiation;Difficulty sequencing;Requires verbal cues;Requires tactile cues General Comments: Pt presenting with decreased awareness, attention, and safety. Requiring increased time. Pt answering majority of questions, but mumbling speech, able to correct with cues to speak up/more clearly. Becoming anxious when feeling that something is too difficult.      Exercises Other Exercises Other Exercises: RLE LAQ, AROM R PF against light resistance; no active R DF noted Other Exercises: x2 sit to stands with tactile input at R knee for extension    General Comments General comments (skin integrity, edema, etc.): VSS      Pertinent Vitals/Pain Pain Assessment: Faces Faces Pain Scale: Hurts little more Pain Location: RLE pain / generalized Pain Descriptors /  Indicators: Discomfort;Grimacing;Tiring Pain Intervention(s): Monitored during session;Limited activity within patient's tolerance    Home Living                      Prior Function            PT Goals (current goals can now be found in the care plan section) Acute Rehab PT Goals Patient Stated Goal: to be able to move better Progress towards PT goals: Progressing toward goals    Frequency    Min 4X/week      PT Plan Current plan remains appropriate    Co-evaluation PT/OT/SLP Co-Evaluation/Treatment: Yes Reason for Co-Treatment: Necessary to address cognition/behavior during functional activity;For patient/therapist safety;To address functional/ADL transfers PT goals addressed during session: Mobility/safety with mobility;Balance;Proper use of DME OT goals addressed during session: ADL's and self-care      AM-PAC PT "6 Clicks" Mobility   Outcome Measure  Help needed turning from your back to your side while in a flat bed without using bedrails?: A Little Help needed moving from lying on your back to sitting on the side of a flat bed without using bedrails?: A Little Help needed moving to and from a bed to a chair (including a wheelchair)?: A Lot Help needed standing up from a chair using your arms (e.g., wheelchair or bedside chair)?: A Lot Help needed to walk in hospital room?: A Lot Help needed climbing 3-5 steps with a railing? : Total 6 Click Score: 13    End of Session Equipment Utilized During Treatment: Gait belt Activity Tolerance: Patient tolerated treatment well Patient left: in chair;with call bell/phone within reach;with chair alarm set;with nursing/sitter in room Nurse Communication: Mobility status PT Visit Diagnosis: Other abnormalities of gait and mobility (R26.89);Difficulty in walking, not elsewhere classified (R26.2);Muscle weakness (generalized) (M62.81);Unsteadiness on feet (R26.81)     Time: 2707-8675 PT Time Calculation (min) (ACUTE  ONLY): 30 min  Charges:  $Therapeutic Activity: 8-22 mins  Ina Homes, PT, DPT Acute Rehabilitation Services  Pager [phone] Office [phone]  Malachy Chamber 11/27/2020, 5:29 PM

## 2020-11-27 NOTE — Progress Notes (Addendum)
PROGRESS NOTE  Nathaniel Lynn  YQI:[id number] DOB: 1990-08-20 DOA: 11/10/2020 PCP: Oneita Hurt, No   Brief Narrative Nathaniel Lynn is a 30 y.o. male with a history of narcotic abuse recently release from 2 year prison sentence who was found down reportedly "getting high" at girlfriend's house on 5/14, taken to [facility] for evaluation. Labs notable for +UDS (amphetamines, opiates, cannabinoids), Cr 2.33 (baseline 0.8-1.2), CK level >50K, LA 3.0. CT Head was negative for acute pathology. While in the ED, the patient was given Ativan x 2 for tonic-clonic seizure-like activity and was intubated shortly after seizure-like activity was noted. Concern for "posturing". Transferred to [facility] ICU for further evaluation and continuous EEG monitoring. Empiric antibiotics were given and withdrawn after unremarkable LP performed 5/19 with acyclovir stopped after negative HSV. AKI improved with fluids. Tracheal aspirate grew stenotrophomonas and flavobacterium for which 10-day course of levaquin was started on 5/21. No purposeful movements despite weaning sedation initially, though he eventually began following commands intermittently. EEG demonstrated nonspecific moderate diffuse encephalopathy without epileptiform discharges. MRI brain suggested global hypoxic ischemic injury. Precedex was given for agitation and patient extubated 5/25. The patient was transferred to hospitalist service 5/28 still requiring prn sedation for agitation and having post-ATN diuresis and hypernatremia which has improved.   Assessment & Plan: Active Problems:   Status epilepticus (HCC)   Acute respiratory failure with hypoxemia (HCC)   Encephalopathy   Pressure injury of skin   AKI (acute kidney injury) (HCC)   Aspiration pneumonia (HCC)   Hypernatremia  Acute toxic, metabolic encephalopathy and concern for now chronic hypoxic encephalopathy: UDS positive for amphetamines, THC, opiates. CSF Cx unremarkable, neg for viral or bacterial process,  HSV neg 5/21. EEG with moderate nonspecific slowing, no epileptiform discharges. MRI Brain 5/16 suggestive of nonspecific encephalopathy with motion-degraded repeat MRI 5/24 consistent with expected evolution of hypoxic injury without other new or progressive findings.  - Continue prn haldol. Consistently getting nighttime haldol, will change seroquel to 50mg  qAM, 100mg  qPM. Would use sitter in preference to pharmacologic treatments of impulsivity. - Oxycodone scheduled w/bowel regimen. Decreased in planned taper to 5mg  po BID on 5/30.  - Continue clonidine adjunct, will plan taper as ordered. - Empiric thiamine, folate, MVM  Acute hypoxic respiratory failure: Due to encephalopathy, now extubated. Also due to Stenotrophomonas and flavobacterium indologenes tracheobronchitis. - Off oxygen. - Completed 10 days levaquin on 5/31.   AKI due to ATN: - Continues improving with supportive care only.  Hypernatremia: Resolved.   Rhabdomyolysis: Improving. CK cleared as of 5/31.  Hypokalemia: Supplemented and resolved.  Demand myocardial ischemia: Troponin elevation more consistent with rhabdomyolysis, renal dysfunction than primary ACS.  - ADDENDUM: Reported telemetry ST segment changes despite patient's clinical status remaining very stable without chest pain complaints. Recheck ECG personally reviewed at bedside which is nearly unchanged from prior except with slower ventricular rate. NSR with normal axis and intervals, Prominent precordial QRS and T waves without flattening/inversion, or ST segment deviation. Perhaps telemetry is noting high voltage and slight j-point elevation. Note no regional wall motion abnormalities on echo earlier this admission. Will continue routine monitoring.   HTN:  - Anticipate improvement with improvement in agitation and hypervolemia.  - Clonidine as above  LFT elevation: Due to toxins and rhabdomyolysis, significantly improved. - Continue monitoring  intermittently  IVDU, Polysubstance abuse:  - Tx as above - NR HIV - No vegetations on TTE  Lactic acidosis: Resolved.  Moderate protein calorie malnutrition:  - Supplement as able.  LUE  superficial thrombosis, RUE/ RLE edema: Improving edema with diuresis. No DVT on venous U/S of all extremities.  - Continue prophylactic anticoagulation, can convert to lovenox given improvement in CrCl.   Thrombocytosis: Reactive. - Monitor intermittently  RN Pressure Injury Documentation: Pressure Injury 11/20/20 Ear Left;Posterior Stage 3 -  Full thickness tissue loss. Subcutaneous fat may be visible but bone, tendon or muscle are NOT exposed. (Active)  11/20/20 1500  Location: Ear  Location Orientation: Left;Posterior  Staging: Stage 3 -  Full thickness tissue loss. Subcutaneous fat may be visible but bone, tendon or muscle are NOT exposed.  Wound Description (Comments):   Present on Admission: No    Obesity: Previously documented, though with resolution of AKI and autodiuresis, weight now not consistent with elevated BMI. Weight 208 on admission (5/17) up to 253lbs (5/21), now down to 177lbs on 5/31.   DVT prophylaxis: Lovenox Code Status: Full Family Communication: None at bedside Disposition Plan:  Status is: Inpatient  Remains inpatient appropriate because:Hemodynamically unstable, Persistent severe electrolyte disturbances, Altered mental status and Inpatient level of care appropriate due to severity of illness  Dispo: The patient is from: Home              Anticipated d/c is to: TBD, CIR consulted; unable to take while requiring sitter. Making adjustments in medications and will trial without sitter once more stable.               Patient currently is not medically stable to d/c.   Difficult to place patient No  Consultants:   PCCM  Procedures:   ETT  Antimicrobials:  Levaquin   Subjective: No complaints at this time. Got another dose of haldol last night for  agitation.   Objective: Vitals:   11/27/20 0500 11/27/20 0834 11/27/20 1132 11/27/20 1508  BP:  124/78 124/71 112/72  Pulse:  78 87 (!) 115  Resp:  20 12 16   Temp:  97.7 F (36.5 C) 97.7 F (36.5 C) 97.7 F (36.5 C)  TempSrc:  Oral Oral Oral  SpO2:  99% 100% 99%  Weight: 80.4 kg       Intake/Output Summary (Last 24 hours) at 11/27/2020 1629 Last data filed at 11/27/2020 1514 Gross per 24 hour  Intake 480 ml  Output 3250 ml  Net -2770 ml   Filed Weights   11/23/20 0419 11/26/20 0227 11/27/20 0500  Weight: 112.9 kg 84.1 kg 80.4 kg   Gen: 30 y.o. male in no distress Pulm: Nonlabored breathing room air. Clear. CV: Regular rate and rhythm. No murmur, rub, or gallop. No JVD, no dependent edema. GI: Abdomen soft, non-tender, non-distended, with normoactive bowel sounds.  Ext: Warm, no deformities. Swelling has resolved.  Skin: No new rashes, lesions or ulcers on visualized skin. Scars on extremities stable.  Neuro: Alert with clearly impaired cognition. No focal neurological deficits. Psych: Judgement and insight appear impaired. Calm at time of my exam.    Data Reviewed: I have personally reviewed following labs and imaging studies  CBC: Recent Labs  Lab 11/22/20 0319 11/24/20 0352 11/26/20 0444  WBC 9.3 8.1 6.7  HGB 8.8* 8.8* 9.7*  HCT 28.6* 27.3* 29.3*  MCV 99.3 95.8 91.8  PLT 418* 476* 499*   Basic Metabolic Panel: Recent Labs  Lab 11/22/20 0319 11/23/20 0439 11/24/20 0352 11/25/20 0330 11/26/20 0444 11/27/20 0403  NA 149* 151* 144 139 137 137  K 3.7 3.6 3.9 3.4* 3.7 3.7  CL 118* 117* 111 108 104 104  CO2 25  25 25 24 25 24   GLUCOSE 105* 109* 99 102* 138* 117*  BUN 48* 48* 38* 30* 30* 26*  CREATININE 1.84* 1.85* 1.67* 1.59* 1.54* 1.40*  CALCIUM 8.3* 8.9 8.8* 8.8* 9.2 9.5  MG 2.4  --  2.1  --   --   --   PHOS 4.4  --   --   --   --   --    GFR: Estimated Creatinine Clearance: 81.1 mL/min (A) (by C-G formula based on SCr of 1.4 mg/dL (H)). Liver  Function Tests: Recent Labs  Lab 11/21/20 0854 11/25/20 0330 11/27/20 0403  AST 130* 30 26  ALT 166* 60* 46*  ALKPHOS 73 55 60  BILITOT 0.8 0.6 0.6  PROT 5.9* 5.6* 7.2  ALBUMIN 2.4* 2.6* 3.3*   No results for input(s): LIPASE, AMYLASE in the last 168 hours. No results for input(s): AMMONIA in the last 168 hours. Coagulation Profile: No results for input(s): INR, PROTIME in the last 168 hours. Cardiac Enzymes: Recent Labs  Lab 11/21/20 0854 11/27/20 0403  CKTOTAL 819* 159   BNP (last 3 results) No results for input(s): PROBNP in the last 8760 hours. HbA1C: No results for input(s): HGBA1C in the last 72 hours. CBG: Recent Labs  Lab 11/26/20 2317 11/27/20 0339 11/27/20 0820 11/27/20 1314 11/27/20 1510  GLUCAP 112* 109* 102* 107* 177*   Lipid Profile: No results for input(s): CHOL, HDL, LDLCALC, TRIG, CHOLHDL, LDLDIRECT in the last 72 hours. Thyroid Function Tests: No results for input(s): TSH, T4TOTAL, FREET4, T3FREE, THYROIDAB in the last 72 hours. Anemia Panel: No results for input(s): VITAMINB12, FOLATE, FERRITIN, TIBC, IRON, RETICCTPCT in the last 72 hours. Urine analysis:    Component Value Date/Time   COLORURINE YELLOW 11/15/2020 1328   APPEARANCEUR HAZY (A) 11/15/2020 1328   LABSPEC 1.012 11/15/2020 1328   PHURINE 5.0 11/15/2020 1328   GLUCOSEU NEGATIVE 11/15/2020 1328   HGBUR SMALL (A) 11/15/2020 1328   BILIRUBINUR NEGATIVE 11/15/2020 1328   KETONESUR NEGATIVE 11/15/2020 1328   PROTEINUR NEGATIVE 11/15/2020 1328   NITRITE NEGATIVE 11/15/2020 1328   LEUKOCYTESUR NEGATIVE 11/15/2020 1328   No results found for this or any previous visit (from the past 240 hour(s)).    Radiology Studies: No results found.  Scheduled Meds: . [START ON 11/28/2020] (feeding supplement) PROSource Plus  30 mL Oral BID BM  . Chlorhexidine Gluconate Cloth  6 each Topical Daily  . cloNIDine  0.3 mg Oral Q6H   Followed by  . [START ON 11/28/2020] cloNIDine  0.2 mg Oral Q6H    Followed by  . [START ON 11/29/2020] cloNIDine  0.1 mg Oral Q6H   Followed by  . [START ON 11/30/2020] cloNIDine  0.1 mg Oral BID   Followed by  . [START ON 12/01/2020] cloNIDine  0.1 mg Oral Daily  . docusate sodium  100 mg Oral BID  . feeding supplement  1 Container Oral TID BM  . folic acid  1 mg Oral Daily  . heparin injection (subcutaneous)  5,000 Units Subcutaneous Q8H  . insulin aspart  0-6 Units Subcutaneous Q4H  . mouth rinse  15 mL Mouth Rinse BID  . multivitamin with minerals  1 tablet Oral Daily  . mupirocin cream   Topical Daily  . oxyCODONE  5 mg Oral BID  . polyethylene glycol  17 g Oral Daily  . polyvinyl alcohol  1 drop Both Eyes TID  . QUEtiapine  50 mg Oral BID  . sodium chloride flush  10-40 mL  Intracatheter Q12H  . thiamine  100 mg Oral Daily   Continuous Infusions: . sodium chloride 10 mL/hr at 11/27/20 0224     LOS: 17 days   Time spent: 35 minutes.  Tyrone Nine, MD [facility] [email or web address] 11/27/2020, 4:29 PM

## 2020-11-28 LAB — GLUCOSE, CAPILLARY
Glucose-Capillary: 102 mg/dL — ABNORMAL HIGH (ref 70–99)
Glucose-Capillary: 111 mg/dL — ABNORMAL HIGH (ref 70–99)
Glucose-Capillary: 111 mg/dL — ABNORMAL HIGH (ref 70–99)
Glucose-Capillary: 112 mg/dL — ABNORMAL HIGH (ref 70–99)
Glucose-Capillary: 132 mg/dL — ABNORMAL HIGH (ref 70–99)
Glucose-Capillary: 99 mg/dL (ref 70–99)

## 2020-11-28 MED ORDER — METOPROLOL TARTRATE 5 MG/5ML IV SOLN
2.5000 mg | Freq: Once | INTRAVENOUS | Status: AC
  Administered 2020-11-28: 2.5 mg via INTRAVENOUS
  Filled 2020-11-28: qty 5

## 2020-11-28 NOTE — Progress Notes (Signed)
Inpatient Rehab Admissions Coordinator:   Pt. More alert and interactive this AM, may be ready for CIR soon. [facility] team will continue to follow for potential admit pending medical readiness and bed availability.   Megan Salon, MS, CCC-SLP Rehab Admissions Coordinator  [phone] (celll) [phone] (office)

## 2020-11-28 NOTE — Progress Notes (Signed)
Physical Therapy Treatment Patient Details Name: Nathaniel Lynn MRN: [id number] DOB: 1990/12/01 Today's Date: 11/28/2020    History of Present Illness Pt is a 30 y.o. male admitted to [facility] on 11/10/20 after being found down reportedly "getting high" at girlfriend's house; (+) UDS (amphetamines, opiates, cannabinoids). Head CT negative for acute pathology. Pt with tonic-clonic seizure-like activity in ED, intubated with transfer to [facility] ICU. Unremarkable LP 5/19. EEG demonstrated nonspecific moderate diffuse encephalopathy without epileptiform discharges. 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 ETT 5/14-5/25. Course complicated by intermittent agitation. Other workup for AKI, rhabdomyolysis, HTN. PMH includes substance abuse.    PT Comments    Patient received in bed, sitter present. Patient agreeable to PT session. He is reporting R LE and foot pain. Patient requires min assist for bed mobility. Min assist for sit to stand with cues and set up assist. He is able to ambulate 8 feet with RW and mod assist. Chair to follow. He has poor positioning of R LE during ambulation and tends to walk on toes of right foot. Patient became too painful and fatigued and sat down. Declined further attempts at ambulation. Patient performed LE strengthening exercises in recliner. He will continue to benefit from skilled PT while here to improve strength, coordination and safety with mobility.      Follow Up Recommendations  CIR;Supervision/Assistance - 24 hour     Equipment Recommendations  Wheelchair (measurements PT);Rolling walker with 5" wheels;3in1 (PT);Wheelchair cushion (measurements PT);Other (comment) (TBD)    Recommendations for Other Services Rehab consult     Precautions / Restrictions Precautions Precautions: Fall Precaution Comments: R-side weakness Restrictions Weight Bearing Restrictions: No    Mobility  Bed Mobility Overal bed  mobility: Needs Assistance Bed Mobility: Supine to Sit     Supine to sit: Min assist     General bed mobility comments: Patient requires min/mod assist with bringing right LE off bed and for upright posture sitting on the edge of bed. Requires assist to position R LE under him for standing.    Transfers Overall transfer level: Needs assistance Equipment used: Rolling walker (2 wheeled) Transfers: Sit to/from Stand Sit to Stand: Min assist         General transfer comment: Patient goes from sit to stand fairly well with min assist. Reports R LE pain this session with standing.  Ambulation/Gait Ambulation/Gait assistance: Mod assist Gait Distance (Feet): 8 Feet Assistive device: Rolling walker (2 wheeled) Gait Pattern/deviations: Step-to pattern;Decreased step length - right;Decreased weight shift to right;Trunk flexed;Narrow base of support Gait velocity: decreased   General Gait Details: Patient tends to walk on toes of right LE, and keeps narrow stance. He reuqires cues to correct, however due to pain and fatigue he was unable to to continue. Chair to follow.   Stairs             Wheelchair Mobility    Modified Rankin (Stroke Patients Only) Modified Rankin (Stroke Patients Only) Pre-Morbid Rankin Score: No symptoms Modified Rankin: Moderately severe disability     Balance Overall balance assessment: Needs assistance Sitting-balance support: Feet supported Sitting balance-Leahy Scale: Fair Sitting balance - Comments: requires supervision for safety Postural control: Left lateral lean   Standing balance-Leahy Scale: Fair Standing balance comment: Reliant on external assist with lateral lean, reliant on B UE support  Cognition Arousal/Alertness: Awake/alert Behavior During Therapy: Flat affect Overall Cognitive Status: Impaired/Different from baseline Area of Impairment: Orientation;Following  commands;Safety/judgement;Problem solving                 Orientation Level: Disoriented to;Time;Situation Current Attention Level: Sustained Memory: Decreased recall of precautions;Decreased short-term memory Following Commands: Follows one step commands consistently;Follows multi-step commands with increased time;Follows one step commands with increased time Safety/Judgement: Decreased awareness of safety;Decreased awareness of deficits Awareness: Intellectual Problem Solving: Slow processing;Decreased initiation;Difficulty sequencing;Requires verbal cues;Requires tactile cues General Comments: Patient had fall this morning trying to get out of bed on his own. Now has sitter.      Exercises      General Comments        Pertinent Vitals/Pain Pain Assessment: Faces Faces Pain Scale: Hurts little more Pain Location: R foot and leg pain Pain Descriptors / Indicators: Discomfort;Grimacing Pain Intervention(s): Monitored during session;Limited activity within patient's tolerance;Repositioned    Home Living                      Prior Function            PT Goals (current goals can now be found in the care plan section) Acute Rehab PT Goals Patient Stated Goal: none stated PT Goal Formulation: Patient unable to participate in goal setting Time For Goal Achievement: 12/07/20 Progress towards PT goals: Progressing toward goals    Frequency    Min 4X/week      PT Plan Current plan remains appropriate    Co-evaluation              AM-PAC PT "6 Clicks" Mobility   Outcome Measure  Help needed turning from your back to your side while in a flat bed without using bedrails?: A Little Help needed moving from lying on your back to sitting on the side of a flat bed without using bedrails?: A Little Help needed moving to and from a bed to a chair (including a wheelchair)?: A Lot Help needed standing up from a chair using your arms (e.g., wheelchair or  bedside chair)?: A Little Help needed to walk in hospital room?: A Lot Help needed climbing 3-5 steps with a railing? : A Lot 6 Click Score: 15    End of Session Equipment Utilized During Treatment: Gait belt Activity Tolerance: Patient limited by pain;Patient limited by fatigue Patient left: in chair;with nursing/sitter in room Nurse Communication: Mobility status PT Visit Diagnosis: Other abnormalities of gait and mobility (R26.89);Difficulty in walking, not elsewhere classified (R26.2);Muscle weakness (generalized) (M62.81);Unsteadiness on feet (R26.81);Pain;History of falling (Z91.81) Pain - Right/Left: Right Pain - part of body: Leg;Ankle and joints of foot     Time: 6295-2841 PT Time Calculation (min) (ACUTE ONLY): 24 min  Charges:  $Gait Training: 8-22 mins $Therapeutic Exercise: 8-22 mins                     [employer], PT, GCS 11/28/20,11:03 AM

## 2020-11-28 NOTE — Progress Notes (Addendum)
Post Fall Note  Patient had order for 1:1 sitter but one had not shown up yet, nearby staff was assisting with watching patient. Patient had tried to get up previously and patient was aided back in bed.   About 10 minutes later, as staff was walking by, 2 NTs noticed the patient attempting to climb over side rail to get out of bed. Patient eased himself to the ground, knees first with NTs help and then shifted to his buttocks. It was found that the bed alarm did not go off because on the bedside panel the notification "failed to set" was on the screen. Sitter is now was bedside.   Patient was calm, stated no pain, and correctly answered to his full name and was able to say he was in the hospital. When asked the reason he was trying to get out of bed, he shrugged and mumbled "trying to get out of bed."    11/28/20 0738  What Happened  Was fall witnessed? Yes  Who witnessed fall? Zharia (NT), Essence (NT)  Patients activity before fall ambulating-unassisted;other (comment) (Patient attempting to get out of bed, no sitter)  Point of contact hip/leg;other (comment) (Patient eased self to floor with knees down first)  Was patient injured? No  Follow Up  MD notified Pokhrel, MD  Time MD notified 53  Family notified Yes - comment  Time family notified [phone]  Additional tests No  Simple treatment Other (comment) (None)  Progress note created (see row info) Yes  Adult Fall Risk Assessment  Risk Factor Category (scoring not indicated) Fall has occurred during this admission (document High fall risk);History of more than one fall within 6 months before admission (document High fall risk);High fall risk per protocol (document High fall risk)  Age 30  Fall History: Fall within 6 months prior to admission 5  Elimination; Bowel and/or Urine Incontinence 0  Elimination; Bowel and/or Urine Urgency/Frequency 0  Medications: includes PCA/Opiates, Anti-convulsants, Anti-hypertensives, Diuretics,  Hypnotics, Laxatives, Sedatives, and Psychotropics 7  Patient Care Equipment 2  Mobility-Assistance 2  Mobility-Gait 2  Mobility-Sensory Deficit 0  Altered awareness of immediate physical environment 1  Impulsiveness 2  Lack of understanding of one's physical/cognitive limitations 4  Total Score 25  Patient Fall Risk Level High fall risk  Adult Fall Risk Interventions  Required Bundle Interventions *See Row Information* High fall risk - low, moderate, and high requirements implemented  Additional Interventions Pharmacy review of medications;Reorient/diversional activities with confused patients;PT/OT need assessed if change in mobility from baseline;Room near nurses station;Safety Sitter/Safety Rounder;Use of appropriate toileting equipment (bedpan, BSC, etc.)  Screening for Fall Injury Risk (To be completed on HIGH fall risk patients) - Assessing Need for Floor Mats  Risk For Fall Injury- Criteria for Floor Mats Confusion/dementia (+NuDESC, CIWA, TBI, etc.);Noncompliant with safety precautions  Will Implement Floor Mats Yes  Vitals  Temp 97.9 F (36.6 C)  Temp Source Oral  BP 125/82  MAP (mmHg) 93  BP Location Right Arm  BP Method Automatic  Patient Position (if appropriate) Lying  Pulse Rate 92  Pulse Rate Source Dinamap  Resp 18  Oxygen Therapy  SpO2 98 %  O2 Device Room Air  Pain Assessment  Pain Scale 0-10  Pain Score 0  Critical Care Pain Observation Tool (CPOT)  Facial Expression 0  Body Movements 0  Muscle Tension 0  Compliance with ventilator (intubated pts.) N/A  Vocalization (extubated pts.) 0  CPOT Total 0  Neurological  Neuro (WDL) X  Level of  Consciousness Alert  Orientation Level Oriented to person;Oriented to place  Cognition Impulsive;Poor safety awareness;Follows commands  Speech Slurred/Dysarthria;Delayed responses  Pupil Assessment  Yes  R Pupil Size (mm) 3  R Pupil Shape Round  R Pupil Reaction Brisk  L Pupil Size (mm) 3  L Pupil Shape Round   L Pupil Reaction Brisk  Additional Pupil Assessments No  Motor Function/Sensation Assessment Grip;Motor response;Motor strength  R Hand Grip Present;Moderate  L Hand Grip Present;Moderate ;Weak   R Foot Dorsiflexion Present;Moderate;Weak  L Foot Dorsiflexion Present;Moderate;Weak  R Foot Plantar Flexion Present;Moderate  L Foot Plantar Flexion Present;Weak;Moderate  RUE Motor Response Purposeful movement  RUE Sensation Full sensation  RUE Motor Strength 4  LUE Motor Response Purposeful movement  LUE Sensation Full sensation  LUE Motor Strength 4  RLE Motor Response Purposeful movement  RLE Sensation Full sensation  RLE Motor Strength 3  LLE Motor Response Purposeful movement  LLE Sensation Full sensation  LLE Motor Strength 3  Neuro Symptoms Fatigue  Neuro symptoms relieved by Rest  Glasgow Coma Scale  Eye Opening 4  Best Verbal Response (NON-intubated) 4  Best Motor Response 6  Glasgow Coma Scale Score 14  Musculoskeletal  Musculoskeletal (WDL) X  Assistive Device Stedy  Generalized Weakness Yes  Weight Bearing Restrictions No  Integumentary  Integumentary (WDL) X  Skin Color Appropriate for ethnicity  Skin Condition Dry  Skin Integrity Other (Comment);Abrasion;Contact dermatitis (Scarring on R. Thigh)  Abrasion Location Ear;Knee  Abrasion Location Orientation Left;Right  Abrasion Intervention Other (Comment) (Assessed)  Contact Dermatitis Location Thigh  Contact Dermatitis Location Orientation Right  Contact Dermatitis Intervention Other (Comment) (Asessed)  Skin Turgor Non-tenting

## 2020-11-28 NOTE — Progress Notes (Signed)
PROGRESS NOTE  [name] JSH:[id number] DOB: 12-Jan-1991 DOA: 11/10/2020 PCP: Pcp, No   LOS: 18 days   Brief narrative: Nathaniel Lynn is a 30 y.o. male with a history of narcotic abuse recently release from 2 year prison sentence was found in the reportedly getting high at his girlfriend's house on 5/14, taken to [facility] for evaluation. Labs notable for +UDS (amphetamines, opiates, cannabinoids), Cr 2.33 (baseline 0.8-1.2), CK level >50K, with lactic acid of 3.0.. CT Head was negative for acute pathology.   While in the ED, the patient was given Ativan x 2 for tonic-clonic seizure-like activity and was intubated shortly after seizure-like activity was noted. Concern for "posturing". Transferred to [facility] ICU for further evaluation and continuous EEG monitoring. Empiric antibiotics were given and withdrawn after unremarkable. LP performed 5/19 with acyclovir stopped after negative HSV. AKI improved with fluids. Tracheal aspirate grew stenotrophomonas and flavobacterium for which 10-day course of levaquin was started on 11/17/20. EEG demonstrated nonspecific moderate diffuse encephalopathy without epileptiform discharges. MRI brain suggested global hypoxic ischemic injury. Precedex was given for agitation and patient extubated 5/25. The patient was transferred to hospitalist service 5/28 still requiring prn sedation for agitation and having post-ATN diuresis and hypernatremia which has improved.    Assessment/Plan:  Active Problems:   Status epilepticus (HCC)   Acute respiratory failure with hypoxemia (HCC)   Encephalopathy   Pressure injury of skin   AKI (acute kidney injury) (HCC)   Aspiration pneumonia (HCC)   Hypernatremia  Acute toxic, metabolic encephalopathy and concern for now chronic hypoxic encephalopathy:  On initial presentation UDS positive for amphetamines, THC, opiates. CSF unremarkable.  EEG with nonspecific findings.  MRI of the brain showed nonspecific encephalopathy but  repeat MRI showed evolution of hypoxic injury.  Has episodes of agitation.  Was getting Haldol.  Currently on seroquel  50mg  qAM, 100mg  qPM.   Has one-to-one sitter as well. Oxycodone scheduled w/bowel regimen. Decreased in planned taper to 5mg  po BID on 5/30.  Continue thiamine folic acid multivitamin and clonidine taper.  Acute hypoxic respiratory failure: Due to encephalopathy, now extubated. Also due to Stenotrophomonas and flavobacterium indologenes tracheobronchitis.  Currently without any supplemental oxygen.  Completed 10-day course of Levaquin on 11/27/2020  Acute kidney injury secondary to ATN: Improving with supportive care.  Monitor BMP  Hypernatremia: Resolved.  Latest sodium of 137.  Rhabdomyolysis: Improved.  CK normalized 531.  Hypokalemia: improved after supplementation.  Demand myocardial ischemia:  Mild troponin elevation 2D echocardiogram with no regional wall motion abnormalities.  Patient did not have any chest pain.  Essential hypertension Has been started on clonidine.  LFT elevation: Improved.  Likely secondary to rhabdomyolysis.  IVDU, Polysubstance abuse:  TTE without any vegetations.  Closely.  On clonidine taper.  Lactic acidosis: Resolved.  Moderate protein calorie malnutrition:  Present on admission.  Seen by nutrition service.  Continue supplementation  LUE superficial thrombosis, RUE/ RLE edema: Ultrasound of the upper extremities without DVT.  Edema has improved with diuresis.  Continue prophylactic Lovenox.  Thrombocytosis: Likely reactive in nature.  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  Continue wound care. Pressure Injury 11/20/20 Ear Left;Posterior Stage 3 -  Full thickness tissue loss. Subcutaneous fat may be visible but bone, tendon or muscle are NOT exposed. (Active)  11/20/20 1500  Location: Ear  Location Orientation: Left;Posterior  Staging: Stage 3 -  Full thickness tissue loss. Subcutaneous fat may be visible but  bone, tendon or muscle are NOT exposed.  Wound  Description (Comments):   Present on Admission: No     DVT prophylaxis: enoxaparin (LOVENOX) injection 40 mg Start: 11/27/20 2200 SCDs Start: 11/10/20 2317    Code Status: Full code  Family Communication: None  Status is: Inpatient  Remains inpatient appropriate because:Unsafe d/c plan, IV treatments appropriate due to intensity of illness or inability to take PO and Inpatient level of care appropriate due to severity of illness   Dispo: The patient is from: Home              Anticipated d/c is to: CIR              Patient currently is not medically stable to d/c.   Difficult to place patient No   Consultants:  PCCM  Procedures:  Intubation and mechanical ventilation  Anti-infectives:  . None currently  Anti-infectives (From admission, onward)   Start     Dose/Rate Route Frequency Ordered Stop   11/18/20 1200  levofloxacin (LEVAQUIN) IVPB 750 mg        750 mg 100 mL/hr over 90 Minutes Intravenous Every 24 hours 11/18/20 1113 11/27/20 1901   11/13/20 1000  vancomycin (VANCOREADY) IVPB 1000 mg/200 mL  Status:  Discontinued        1,000 mg 200 mL/hr over 60 Minutes Intravenous Every 24 hours 11/12/20 0910 11/16/20 1017   11/12/20 1000  cefTRIAXone (ROCEPHIN) 2 g in sodium chloride 0.9 % 100 mL IVPB  Status:  Discontinued        2 g 200 mL/hr over 30 Minutes Intravenous Every 12 hours 11/12/20 0848 11/16/20 1017   11/12/20 1000  vancomycin (VANCOREADY) IVPB 1750 mg/350 mL        1,750 mg 175 mL/hr over 120 Minutes Intravenous  Once 11/12/20 0910 11/12/20 1219   11/12/20 1000  acyclovir (ZOVIRAX) 780 mg in dextrose 5 % 150 mL IVPB  Status:  Discontinued        10 mg/kg  78 kg 165.6 mL/hr over 60 Minutes Intravenous Every 12 hours 11/12/20 0910 11/17/20 1049     Subjective: Today, patient was seen and examined at bedside.  Patient is very slow to respond.  Denies any pain nausea vomiting.  Answering few questions  appropriately  Objective: Vitals:   11/28/20 0850 11/28/20 1212  BP: 127/75 113/75  Pulse: 79 92  Resp: 20 16  Temp: 98.8 F (37.1 C) 98.2 F (36.8 C)  SpO2: 100% 99%    Intake/Output Summary (Last 24 hours) at 11/28/2020 1355 Last data filed at 11/28/2020 1100 Gross per 24 hour  Intake 350 ml  Output 2700 ml  Net -2350 ml   Filed Weights   11/23/20 0419 11/26/20 0227 11/27/20 0500  Weight: 112.9 kg 84.1 kg 80.4 kg   Body mass index is 25.43 kg/m.   Physical Exam: GENERAL: Patient is alert awake and communicative, slow to respond,. Not in obvious distress.  Deconditioned and appears ill. HENT: No scleral pallor or icterus. Pupils equally reactive to light. Oral mucosa is moist NECK: is supple, no gross swelling noted. CHEST: Clear to auscultation. No crackles or wheezes.  Diminished breath sounds bilaterally. CVS: S1 and S2 heard, no murmur. Regular rate and rhythm.  ABDOMEN: Soft, non-tender, bowel sounds are present. EXTREMITIES: No edema. CNS: Cranial nerves are intact.  Moves extremities but generalized weakness noted.  Follows few commands. SKIN: warm and dry without rashes.  Data Review: I have personally reviewed the following laboratory data and studies,  CBC: Recent Labs  Lab 11/22/20  0319 11/24/20 0352 11/26/20 0444  WBC 9.3 8.1 6.7  HGB 8.8* 8.8* 9.7*  HCT 28.6* 27.3* 29.3*  MCV 99.3 95.8 91.8  PLT 418* 476* 499*   Basic Metabolic Panel: Recent Labs  Lab 11/22/20 0319 11/23/20 0439 11/24/20 0352 11/25/20 0330 11/26/20 0444 11/27/20 0403  NA 149* 151* 144 139 137 137  K 3.7 3.6 3.9 3.4* 3.7 3.7  CL 118* 117* 111 108 104 104  CO2 25 25 25 24 25 24   GLUCOSE 105* 109* 99 102* 138* 117*  BUN 48* 48* 38* 30* 30* 26*  CREATININE 1.84* 1.85* 1.67* 1.59* 1.54* 1.40*  CALCIUM 8.3* 8.9 8.8* 8.8* 9.2 9.5  MG 2.4  --  2.1  --   --   --   PHOS 4.4  --   --   --   --   --    Liver Function Tests: Recent Labs  Lab 11/25/20 0330 11/27/20 0403  AST  30 26  ALT 60* 46*  ALKPHOS 55 60  BILITOT 0.6 0.6  PROT 5.6* 7.2  ALBUMIN 2.6* 3.3*   No results for input(s): LIPASE, AMYLASE in the last 168 hours. No results for input(s): AMMONIA in the last 168 hours. Cardiac Enzymes: Recent Labs  Lab 11/27/20 0403  CKTOTAL 159   BNP (last 3 results) No results for input(s): BNP in the last 8760 hours.  ProBNP (last 3 results) No results for input(s): PROBNP in the last 8760 hours.  CBG: Recent Labs  Lab 11/27/20 1953 11/28/20 0002 11/28/20 0357 11/28/20 0903 11/28/20 1211  GLUCAP 115* 132* 111* 111* 112*   No results found for this or any previous visit (from the past 240 hour(s)).   Studies: No results found.    01/28/21, MD  [facility] 11/28/2020  If 7PM-7AM, please contact night-coverage

## 2020-11-28 NOTE — Progress Notes (Signed)
Pt was sustaining ST of 100-115, with highest HR reaching to140.   HR goes up when pt tries to get up (with activity) but pt has only been making subtle changes in bed.  MD notified and placed order for Lopressor. Medication was effective.

## 2020-11-29 LAB — CBC
HCT: 32.7 % — ABNORMAL LOW (ref 39.0–52.0)
Hemoglobin: 11.2 g/dL — ABNORMAL LOW (ref 13.0–17.0)
MCH: 30.9 pg (ref 26.0–34.0)
MCHC: 34.3 g/dL (ref 30.0–36.0)
MCV: 90.1 fL (ref 80.0–100.0)
Platelets: 559 10*3/uL — ABNORMAL HIGH (ref 150–400)
RBC: 3.63 MIL/uL — ABNORMAL LOW (ref 4.22–5.81)
RDW: 12.5 % (ref 11.5–15.5)
WBC: 9.1 10*3/uL (ref 4.0–10.5)
nRBC: 0 % (ref 0.0–0.2)

## 2020-11-29 LAB — PHOSPHORUS: Phosphorus: 3.8 mg/dL (ref 2.5–4.6)

## 2020-11-29 LAB — GLUCOSE, CAPILLARY
Glucose-Capillary: 126 mg/dL — ABNORMAL HIGH (ref 70–99)
Glucose-Capillary: 145 mg/dL — ABNORMAL HIGH (ref 70–99)
Glucose-Capillary: 111 mg/dL — ABNORMAL HIGH (ref 70–99)
Glucose-Capillary: 131 mg/dL — ABNORMAL HIGH (ref 70–99)
Glucose-Capillary: 130 mg/dL — ABNORMAL HIGH (ref 70–99)

## 2020-11-29 LAB — COMPREHENSIVE METABOLIC PANEL
ALT: 30 U/L (ref 0–44)
AST: 24 U/L (ref 15–41)
Albumin: 3.7 g/dL (ref 3.5–5.0)
Alkaline Phosphatase: 63 U/L (ref 38–126)
Anion gap: 8 (ref 5–15)
BUN: 21 mg/dL — ABNORMAL HIGH (ref 6–20)
CO2: 24 mmol/L (ref 22–32)
Calcium: 9.4 mg/dL (ref 8.9–10.3)
Chloride: 104 mmol/L (ref 98–111)
Creatinine, Ser: 1.06 mg/dL (ref 0.61–1.24)
GFR, Estimated: >60 mL/min (ref >60)
Glucose, Bld: 107 mg/dL — ABNORMAL HIGH (ref 70–99)
Potassium: 3.4 mmol/L — ABNORMAL LOW (ref 3.5–5.1)
Sodium: 136 mmol/L (ref 135–145)
Total Bilirubin: 0.7 mg/dL (ref 0.3–1.2)
Total Protein: 7.4 g/dL (ref 6.5–8.1)

## 2020-11-29 LAB — MAGNESIUM: Magnesium: 2 mg/dL (ref 1.7–2.4)

## 2020-11-29 MED ORDER — HALOPERIDOL LACTATE 5 MG/ML IJ SOLN
2.0000 mg | Freq: Four times a day (QID) | INTRAMUSCULAR | Status: DC | PRN
  Administered 2020-12-02: 2 mg via INTRAVENOUS
  Filled 2020-11-29 (×4): qty 1

## 2020-11-29 NOTE — Evaluation (Signed)
Speech Language Pathology Evaluation Patient Details Name: Nathaniel Lynn MRN: [id number] DOB: May 27, 1991 Today's Date: 11/29/2020 Time: 9509-3267 SLP Time Calculation (min) (ACUTE ONLY): 25 min  Problem List:  Patient Active Problem List   Diagnosis Date Noted  . Pressure injury of skin 11/21/2020  . AKI (acute kidney injury) (HCC)   . Aspiration pneumonia (HCC)   . Hypernatremia   . Acute respiratory failure with hypoxemia (HCC)   . Encephalopathy   . Status epilepticus (HCC) 11/10/2020   Past Medical History:  Past Medical History:  Diagnosis Date  . IVDU (intravenous drug user)    Past Surgical History: History reviewed. No pertinent surgical history. HPI:      Assessment / Plan / Recommendation Clinical Impression  Patient with cognitive deficits in the areas of short term memory, awareness, attention, and executive function. Patient will benefit from continued SLP services in addition to CIR consult for next level of care.    SLP Assessment  SLP Recommendation/Assessment: Patient needs continued Speech Lanaguage Pathology Services SLP Visit Diagnosis: Cognitive communication deficit (R41.841)    Follow Up Recommendations  Inpatient Rehab    Frequency and Duration min 3x week  2 weeks      SLP Evaluation Cognition  Overall Cognitive Status: Impaired/Different from baseline Arousal/Alertness: Awake/alert Orientation Level: Oriented X4 Attention: Sustained Sustained Attention: Impaired Sustained Attention Impairment: Functional basic;Functional complex Memory: Impaired Memory Impairment: Storage deficit;Retrieval deficit;Decreased recall of new information Awareness: Impaired Awareness Impairment: Intellectual impairment Problem Solving: Impaired Problem Solving Impairment: Verbal basic;Functional basic Executive Function: Reasoning;Decision Making;Self Monitoring Reasoning: Impaired Reasoning Impairment: Verbal basic;Functional basic Decision Making:  Impaired Decision Making Impairment: Verbal basic;Functional basic Self Monitoring: Impaired Self Monitoring Impairment: Verbal basic;Functional basic Safety/Judgment: Impaired Comments: poor safety awareness       Comprehension  Auditory Comprehension Overall Auditory Comprehension: Appears within functional limits for tasks assessed Visual Recognition/Discrimination Discrimination: Within Function Limits Reading Comprehension Reading Status: Within funtional limits (although difficulty locating words, ? visual deficit)    Expression Expression Primary Mode of Expression: Verbal Verbal Expression Overall Verbal Expression: Appears within functional limits for tasks assessed   Oral / Motor  Oral Motor/Sensory Function Overall Oral Motor/Sensory Function: Generalized oral weakness Motor Speech Overall Motor Speech: Impaired Respiration: Within functional limits Phonation: Low vocal intensity;Hoarse (due to intubation and general weakness, can increase volume when prompted) Resonance: Within functional limits Articulation: Impaired Level of Impairment: Word Intelligibility: Intelligibility reduced Word: 25-49% accurate Phrase: 25-49% accurate Sentence: 25-49% accurate Conversation: 25-49% accurate Motor Planning: Witnin functional limits Effective Techniques: Increased vocal intensity;Over-articulate   GO                   [name] MA, CCC-SLP   [name] [name] 11/29/2020, 11:39 AM

## 2020-11-29 NOTE — Evaluation (Signed)
Occupational Therapy Evaluation Patient Details Name: Nathaniel Lynn MRN: [id number] DOB: 29-Aug-1990 Today's Date: 11/29/2020    History of Present Illness 30 y.o. male admitted to [facility] on 11/10/20 after being found down reportedly "getting high" at girlfriend's house; (+) UDS (amphetamines, opiates, cannabinoids). Head CT negative for acute pathology. Pt with tonic-clonic seizure-like activity in ED, intubated with transfer to [facility] ICU. Unremarkable LP 5/19. EEG demonstrated nonspecific moderate diffuse encephalopathy without epileptiform discharges. 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 ETT 5/14-5/25. Course complicated by intermittent agitation. Other workup for AKI, rhabdomyolysis, HTN. PMH includes substance abuse.   Clinical Impression   Pt progressing towards established OT goals. Continues to present with decreased balance, cognition, strength, and activity tolerance impacting his safe performance of ADLs. Pt performing sit<>stand with Min A +2 and then Max A for posterior lean once upright. Pt performing functional mobility in hallway with Min-max A for balance and RW; cues throughout for sequencing, rest breaks, and safety. Pt performing oral care at sink with Mod-Max A for standing balance and Max cues for sequencing. Continue to recommend dc to CIR for intensive OT and will continue to follow acutely as admitted.     Follow Up Recommendations  CIR;Supervision/Assistance - 24 hour    Equipment Recommendations  Other (comment) (RW)    Recommendations for Other Services Rehab consult     Precautions / Restrictions Precautions Precautions: Fall Precaution Comments: R-side weakness      Mobility Bed Mobility Overal bed mobility: Needs Assistance Bed Mobility: Supine to Sit     Supine to sit: Min guard     General bed mobility comments: Min Guard A for safety    Transfers Overall transfer level: Needs  assistance Equipment used: Rolling walker (2 wheeled) Transfers: Sit to/from Stand Sit to Stand: Min assist;+2 safety/equipment;Max assist         General transfer comment: Min A for power up and then Mod-Max A for gaining balance due to posterior lean.    Balance Overall balance assessment: Needs assistance Sitting-balance support: Feet supported Sitting balance-Leahy Scale: Fair Sitting balance - Comments: requires supervision for safety     Standing balance-Leahy Scale: Poor Standing balance comment: Reliant on UE support and physical A                           ADL either performed or assessed with clinical judgement   ADL Overall ADL's : Needs assistance/impaired     Grooming: Moderate assistance;Maximal assistance;Cueing for sequencing;Standing Grooming Details (indicate cue type and reason): Mod-Max A for standing balance. Max cues for step by step sequencing. Pt initating brushing his teeth without tooth paste (despite having opened the tooth paste) and then giev questioning cues, pt stating, "I dont know what to do next"                 Toilet Transfer: Minimal assistance;Moderate assistance;+2 for safety/equipment;Ambulation (simulated to recliner) Toilet Transfer Details (indicate cue type and reason): Min A for power up and then Mod-Max A for gaining balance due to posterior lean.         Functional mobility during ADLs: Moderate assistance;+2 for physical assistance;+2 for safety/equipment;Rolling walker;Maximal assistance;Minimal assistance General ADL Comments: Pt performing functional mobiltiy in hallway and then oral care at sink. Continues to present with poor cogniton, balance, strength, and safety     Vision         Perception  Praxis      Pertinent Vitals/Pain Pain Assessment: Faces Faces Pain Scale: No hurt Pain Intervention(s): Monitored during session     Hand Dominance     Extremity/Trunk Assessment Upper Extremity  Assessment Upper Extremity Assessment: RUE deficits/detail;LUE deficits/detail RUE Deficits / Details: shoulder flexion 100 degrees, undershooting with objects, able to grasp with pincher grasp RUE Coordination: decreased fine motor LUE Deficits / Details: grasp 4/5, able to raise UE or keep elevated when placed LUE Coordination: decreased gross motor;decreased fine motor   Lower Extremity Assessment Lower Extremity Assessment: Defer to PT evaluation RLE Deficits / Details: pt with grossly 2-/5 strength with significant edema RLE. Pt with consistent buckling in standing       Communication     Cognition Arousal/Alertness: Awake/alert Behavior During Therapy: Flat affect Overall Cognitive Status: Impaired/Different from baseline Area of Impairment: Following commands;Safety/judgement;Problem solving;Attention;Awareness                   Current Attention Level: Sustained;Selective Memory: Decreased recall of precautions;Decreased short-term memory Following Commands: Follows one step commands consistently;Follows multi-step commands with increased time;Follows one step commands with increased time Safety/Judgement: Decreased awareness of safety;Decreased awareness of deficits Awareness: Intellectual Problem Solving: Slow processing;Decreased initiation;Difficulty sequencing;Requires verbal cues;Requires tactile cues General Comments: Pt continues to present with poor attention, sequencing, safety, problem solving, and awareness. Pt with poor awareness of balance deficits and requiring Max cues to stop nad regain balance in addtional physical A. During oral care, pt requiring Max cues for step by step sequencing.   General Comments       Exercises     Shoulder Instructions      Home Living                                          Prior Functioning/Environment                   OT Problem List:        OT Treatment/Interventions:      OT  Goals(Current goals can be found in the care plan section) Acute Rehab OT Goals Patient Stated Goal: none stated Time For Goal Achievement: 12/07/20 Potential to Achieve Goals: Good ADL Goals Pt Will Perform Grooming: sitting;with min guard assist Pt Will Perform Upper Body Bathing: sitting;with min assist Pt Will Transfer to Toilet: with +2 assist;stand pivot transfer;bedside commode;with min assist Additional ADL Goal #1: Pt will follow 2 step commands with 75% accuracy given increased time to optimize participation in ADLs. Additional ADL Goal #2: Pt will complete bed mobility with min guard assist and maintain sitting balance with no more than min assist as precursor to ADLs.  OT Frequency: Min 2X/week   Barriers to D/C:            Co-evaluation PT/OT/SLP Co-Evaluation/Treatment: Yes Reason for Co-Treatment: For patient/therapist safety;To address functional/ADL transfers   OT goals addressed during session: ADL's and self-care      AM-PAC OT "6 Clicks" Daily Activity     Outcome Measure Help from another person eating meals?: A Lot Help from another person taking care of personal grooming?: A Lot Help from another person toileting, which includes using toliet, bedpan, or urinal?: A Lot Help from another person bathing (including washing, rinsing, drying)?: A Lot Help from another person to put on and taking off regular upper body clothing?: A Lot Help from another  person to put on and taking off regular lower body clothing?: A Lot 6 Click Score: 12   End of Session Equipment Utilized During Treatment: Gait belt;Rolling walker Nurse Communication: Mobility status;Precautions  Activity Tolerance: Patient tolerated treatment well Patient left: in chair;with call bell/phone within reach;with chair alarm set;with nursing/sitter in room Psychiatrist)  OT Visit Diagnosis: Other abnormalities of gait and mobility (R26.89);Muscle weakness (generalized) (M62.81);Other symptoms and signs  involving the nervous system (R29.898);Other symptoms and signs involving cognitive function;Cognitive communication deficit (R41.841);Pain Symptoms and signs involving cognitive functions: Cerebral infarction Pain - part of body:  (generalized)                Time: 0539-7673 OT Time Calculation (min): 25 min Charges:  OT General Charges $OT Visit: 1 Visit OT Treatments $Self Care/Home Management : 8-22 mins  Denisa Enterline MSOT, OTR/L Acute Rehab Pager: [phone] Office: [phone]  Theodoro Grist Jette Lewan 11/29/2020, 5:00 PM

## 2020-11-29 NOTE — Progress Notes (Signed)
PROGRESS NOTE  Nathaniel Lynn JKK:[id number] DOB: 13-Sep-1990 DOA: 11/10/2020 PCP: Pcp, No   LOS: 19 days   Brief narrative: Nathaniel Lynn is a 30 y.o. male with a history of narcotic abuse recently released from 2 year prison sentence was found in the reportedly getting high at his girlfriend's house on 5/14, taken to [facility] for evaluation. Labs notable for +UDS (amphetamines, opiates, cannabinoids), Cr 2.33 (baseline 0.8-1.2), CK level >50K, with lactic acid of 3.0. CT Head was negative for acute pathology.   In the ED, the patient was given Ativan x 2 for tonic-clonic seizure-like activity and was intubated shortly after seizure-like activity was noted. Concern for "posturing". Transferred to [facility] ICU for further evaluation and continuous EEG monitoring. Empiric antibiotics were given and withdrawn after unremarkable. LP performed 5/19 with acyclovir stopped after negative HSV. AKI improved with fluids. Tracheal aspirate grew stenotrophomonas and flavobacterium for which 10-day course of levaquin was started on 11/17/20. EEG demonstrated nonspecific moderate diffuse encephalopathy without epileptiform discharges. MRI brain suggested global hypoxic ischemic injury. Precedex was given for agitation and patient extubated 5/25. The patient was transferred to hospitalist service 10/29/20  still requiring prn sedation for agitation and having post-ATN diuresis and hypernatremia which has improved.  Patient also required a one-to-one sitter.   Assessment/Plan:  Active Problems:   Status epilepticus (HCC)   Acute respiratory failure with hypoxemia (HCC)   Encephalopathy   Pressure injury of skin   AKI (acute kidney injury) (HCC)   Aspiration pneumonia (HCC)   Hypernatremia  Acute toxic, metabolic encephalopathy and concern for now chronic hypoxic encephalopathy:   On initial presentation, UDS positive for amphetamines, THC, opiates. CSF unremarkable.  EEG with nonspecific findings.  MRI of the  brain showed nonspecific encephalopathy but repeat MRI showed evolution of hypoxic injury.   Currently on seroquel  50mg  qAM, 100mg  qPM.   Oxycodone scheduled w/bowel regimen. Decreased in planned taper to 5mg  po BID on 5/30.  Continue thiamine, folic acid, multivitamin and clonidine taper.  Still on one-to-one sitter since he had a fall yesterday.  On as needed Haldol.  Appears to be sedated so we will decrease the dose of Haldol to 2 mg every 6h for now.  Acute hypoxic respiratory failure:  Due to encephalopathy, now extubated. Also due to Stenotrophomonas and flavobacterium indologenes tracheobronchitis.  Currently without any supplemental oxygen.  Completed 10-day course of Levaquin on 11/27/2020  Acute kidney injury secondary to ATN: Improving with supportive care.  Improved creatinine levels.  Latest creatinine of 1.06  Hypokalemia.  Will replenish.  Check levels in a.m.  Hypernatremia: Resolved.  Latest sodium of 137.  Rhabdomyolysis: Improved.  CK normalized 531.  Hypokalemia: improved after supplementation.  Demand myocardial ischemia:  Mild troponin elevation, 2D echocardiogram with no regional wall motion abnormalities.  Patient did not have any chest pain.  Essential hypertension Has been started on clonidine.  Blood pressure remains stable.  LFT elevation: Resolved.  Likely secondary to rhabdomyolysis.  IVDU, Polysubstance abuse:  TTE without any vegetations.   On clonidine taper.  Lactic acidosis: Resolved.  Moderate protein calorie malnutrition:  Present on admission.  Seen by nutrition service.  Continue nutritional supplementation  LUE superficial thrombosis, RUE/ RLE edema: Ultrasound of the upper extremities without DVT.  Edema has improved with diuresis.  Continue prophylactic Lovenox.  Thrombocytosis: Likely reactive in nature.  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  Continue wound care. Pressure Injury 11/20/20 Ear Left;Posterior Stage 3 -   Full  thickness tissue loss. Subcutaneous fat may be visible but bone, tendon or muscle are NOT exposed. (Active)  11/20/20 1500  Location: Ear  Location Orientation: Left;Posterior  Staging: Stage 3 -  Full thickness tissue loss. Subcutaneous fat may be visible but bone, tendon or muscle are NOT exposed.  Wound Description (Comments):   Present on Admission: No     DVT prophylaxis: enoxaparin (LOVENOX) injection 40 mg Start: 11/27/20 2200 SCDs Start: 11/10/20 2317    Code Status: Full code  Family Communication: None  Status is: Inpatient  Remains inpatient appropriate because:Unsafe d/c plan, IV treatments appropriate due to intensity of illness or inability to take PO and Inpatient level of care appropriate due to severity of illness   Dispo: The patient is from: Home              Anticipated d/c is to: CIR              Patient currently is not medically stable to d/c.   Difficult to place patient No   Consultants:  PCCM  Procedures:  Intubation and mechanical ventilation  Anti-infectives:  . None currently  Anti-infectives (From admission, onward)   Start     Dose/Rate Route Frequency Ordered Stop   11/18/20 1200  levofloxacin (LEVAQUIN) IVPB 750 mg        750 mg 100 mL/hr over 90 Minutes Intravenous Every 24 hours 11/18/20 1113 11/27/20 1901   11/13/20 1000  vancomycin (VANCOREADY) IVPB 1000 mg/200 mL  Status:  Discontinued        1,000 mg 200 mL/hr over 60 Minutes Intravenous Every 24 hours 11/12/20 0910 11/16/20 1017   11/12/20 1000  cefTRIAXone (ROCEPHIN) 2 g in sodium chloride 0.9 % 100 mL IVPB  Status:  Discontinued        2 g 200 mL/hr over 30 Minutes Intravenous Every 12 hours 11/12/20 0848 11/16/20 1017   11/12/20 1000  vancomycin (VANCOREADY) IVPB 1750 mg/350 mL        1,750 mg 175 mL/hr over 120 Minutes Intravenous  Once 11/12/20 0910 11/12/20 1219   11/12/20 1000  acyclovir (ZOVIRAX) 780 mg in dextrose 5 % 150 mL IVPB  Status:  Discontinued         10 mg/kg  78 kg 165.6 mL/hr over 60 Minutes Intravenous Every 12 hours 11/12/20 0910 11/17/20 1049     Subjective: Today, patient was seen and examined at bedside.  Very slow to respond.  Denies any pain, nausea, vomiting or shortness of breath.  Has slurred speech.  Complains of generalized weakness.   objective: Vitals:   11/29/20 0816 11/29/20 1138  BP: 140/90 125/83  Pulse: (!) 110 90  Resp: 18 18  Temp: 97.7 F (36.5 C) 98.4 F (36.9 C)  SpO2: 99% 99%    Intake/Output Summary (Last 24 hours) at 11/29/2020 1251 Last data filed at 11/28/2020 1700 Gross per 24 hour  Intake 120 ml  Output 900 ml  Net -780 ml   Filed Weights   11/23/20 0419 11/26/20 0227 11/27/20 0500  Weight: 112.9 kg 84.1 kg 80.4 kg   Body mass index is 25.43 kg/m.   Physical Exam: GENERAL: Patient is alert awake and communicative, slow to respond, not in obvious distress, deconditioned and appears ill  HENT: No scleral pallor or icterus. Pupils equally reactive to light. Oral mucosa is moist NECK: is supple, no gross swelling noted. CHEST: Clear to auscultation. No crackles or wheezes.  Diminished breath sounds bilaterally. CVS: S1 and S2  heard, no murmur. Regular rate and rhythm.  ABDOMEN: Soft, non-tender, bowel sounds are present. EXTREMITIES: No edema. CNS: Cranial nerves are intact.  Moves extremities but generalized weakness noted.   SKIN: warm and dry without rashes.  Data Review: I have personally reviewed the following laboratory data and studies,  CBC: Recent Labs  Lab 11/24/20 0352 11/26/20 0444 11/29/20 0435  WBC 8.1 6.7 9.1  HGB 8.8* 9.7* 11.2*  HCT 27.3* 29.3* 32.7*  MCV 95.8 91.8 90.1  PLT 476* 499* 559*   Basic Metabolic Panel: Recent Labs  Lab 11/24/20 0352 11/25/20 0330 11/26/20 0444 11/27/20 0403 11/29/20 0435  NA 144 139 137 137 136  K 3.9 3.4* 3.7 3.7 3.4*  CL 111 108 104 104 104  CO2 25 24 25 24 24   GLUCOSE 99 102* 138* 117* 107*  BUN 38* 30* 30* 26* 21*   CREATININE 1.67* 1.59* 1.54* 1.40* 1.06  CALCIUM 8.8* 8.8* 9.2 9.5 9.4  MG 2.1  --   --   --  2.0  PHOS  --   --   --   --  3.8   Liver Function Tests: Recent Labs  Lab 11/25/20 0330 11/27/20 0403 11/29/20 0435  AST 30 26 24   ALT 60* 46* 30  ALKPHOS 55 60 63  BILITOT 0.6 0.6 0.7  PROT 5.6* 7.2 7.4  ALBUMIN 2.6* 3.3* 3.7   No results for input(s): LIPASE, AMYLASE in the last 168 hours. No results for input(s): AMMONIA in the last 168 hours. Cardiac Enzymes: Recent Labs  Lab 11/27/20 0403  CKTOTAL 159   BNP (last 3 results) No results for input(s): BNP in the last 8760 hours.  ProBNP (last 3 results) No results for input(s): PROBNP in the last 8760 hours.  CBG: Recent Labs  Lab 11/28/20 1613 11/28/20 1948 11/29/20 0402 11/29/20 0832 11/29/20 1141  GLUCAP 102* 99 126* 145* 111*   No results found for this or any previous visit (from the past 240 hour(s)).   Studies: No results found.    01/29/21, MD  [facility] 11/29/2020  If 7PM-7AM, please contact night-coverage

## 2020-11-29 NOTE — Plan of Care (Signed)
  Problem: Safety: Goal: Non-violent Restraint(s) Outcome: Not Progressing   Problem: Education: Goal: Knowledge of General Education information will improve Description: Including pain rating scale, medication(s)/side effects and non-pharmacologic comfort measures Outcome: Not Progressing   Problem: Health Behavior/Discharge Planning: Goal: Ability to manage health-related needs will improve Outcome: Not Progressing   Problem: Clinical Measurements: Goal: Ability to maintain clinical measurements within normal limits will improve Outcome: Progressing Goal: Will remain free from infection Outcome: Progressing Goal: Diagnostic test results will improve Outcome: Progressing Goal: Respiratory complications will improve Outcome: Progressing Goal: Cardiovascular complication will be avoided Outcome: Progressing   Problem: Activity: Goal: Risk for activity intolerance will decrease Outcome: Progressing   Problem: Nutrition: Goal: Adequate nutrition will be maintained Outcome: Not Progressing   Problem: Coping: Goal: Level of anxiety will decrease Outcome: Progressing   Problem: Pain Managment: Goal: General experience of comfort will improve Outcome: Progressing   Problem: Safety: Goal: Ability to remain free from injury will improve Outcome: Progressing   Problem: Skin Integrity: Goal: Risk for impaired skin integrity will decrease Outcome: Progressing

## 2020-11-29 NOTE — Progress Notes (Signed)
Physical Therapy Treatment Patient Details Name: Nathaniel Lynn MRN: [id number] DOB: 11/18/1990 Today's Date: 11/29/2020    History of Present Illness 30 y.o. male admitted to [facility] on 11/10/20 after being found down reportedly "getting high" at girlfriend's house; (+) UDS (amphetamines, opiates, cannabinoids). Head CT negative for acute pathology. Pt with tonic-clonic seizure-like activity in ED, intubated with transfer to [facility] ICU. Unremarkable LP 5/19. EEG demonstrated nonspecific moderate diffuse encephalopathy without epileptiform discharges. 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 ETT 5/14-5/25. Course complicated by intermittent agitation. Other workup for AKI, rhabdomyolysis, HTN. PMH includes substance abuse.    PT Comments    Patient progressing towards physical therapy goals. Patient required minA+2 to stand but maxA to steady due to posterior lean once standing. Patient ambulated 150' with RW and min-maxA for balance and RW management with chair follow. Required cues to stop and regain balance prior to proceeding for safety. Patient continues to be limited by balance, cognitive, coordination, and endurance deficits. Continue to recommend comprehensive inpatient rehab (CIR) for post-acute therapy needs.     Follow Up Recommendations  CIR     Equipment Recommendations  Wheelchair (measurements PT);Rolling Graeson Nouri with 5" wheels;3in1 (PT);Wheelchair cushion (measurements PT);Other (comment)    Recommendations for Other Services       Precautions / Restrictions Precautions Precautions: Fall Precaution Comments: R-side weakness Restrictions Weight Bearing Restrictions: No    Mobility  Bed Mobility Overal bed mobility: Needs Assistance Bed Mobility: Supine to Sit     Supine to sit: Min guard     General bed mobility comments: Min Guard A for safety    Transfers Overall transfer level: Needs  assistance Equipment used: Rolling Demarrio Menges (2 wheeled) Transfers: Sit to/from Stand Sit to Stand: Min assist;+2 safety/equipment;Max assist         General transfer comment: Min A for power up and then Mod-Max A for gaining balance due to posterior lean.  Ambulation/Gait Ambulation/Gait assistance: Mod assist;Max assist;+2 safety/equipment Gait Distance (Feet): 150 Feet Assistive device: Rolling Willys Salvino (2 wheeled) Gait Pattern/deviations: Step-to pattern;Decreased step length - right;Decreased weight shift to right;Trunk flexed;Narrow base of support;Decreased dorsiflexion - right Gait velocity: decreased   General Gait Details: Drags R foot and keeps it externally rotated. Mod-maxA+2 for balance consistently with times of minA. Chair follow beneficial for safety. Cues for increasing step length with poor follow through. Multiple LOB requiring cues for stopping to regain balance before proceeding.   Stairs             Wheelchair Mobility    Modified Rankin (Stroke Patients Only) Modified Rankin (Stroke Patients Only) Pre-Morbid Rankin Score: No symptoms Modified Rankin: Moderately severe disability     Balance Overall balance assessment: Needs assistance Sitting-balance support: Feet supported Sitting balance-Leahy Scale: Fair Sitting balance - Comments: requires supervision for safety   Standing balance support: Bilateral upper extremity supported;During functional activity Standing balance-Leahy Scale: Poor Standing balance comment: Reliant on UE support and physical A                            Cognition Arousal/Alertness: Awake/alert Behavior During Therapy: Flat affect Overall Cognitive Status: Impaired/Different from baseline Area of Impairment: Following commands;Safety/judgement;Problem solving;Attention;Awareness                   Current Attention Level: Sustained;Selective Memory: Decreased recall of precautions;Decreased short-term  memory Following Commands: Follows one step commands consistently;Follows multi-step  commands with increased time;Follows one step commands with increased time Safety/Judgement: Decreased awareness of safety;Decreased awareness of deficits Awareness: Intellectual Problem Solving: Slow processing;Decreased initiation;Difficulty sequencing;Requires verbal cues;Requires tactile cues General Comments: Pt continues to present with poor attention, sequencing, safety, problem solving, and awareness. Pt with poor awareness of balance deficits and requiring Max cues to stop and regain balance in addtional physical A.      Exercises      General Comments        Pertinent Vitals/Pain Pain Assessment: Faces Faces Pain Scale: No hurt Pain Intervention(s): Monitored during session    Home Living                      Prior Function            PT Goals (current goals can now be found in the care plan section) Acute Rehab PT Goals Patient Stated Goal: none stated PT Goal Formulation: Patient unable to participate in goal setting Time For Goal Achievement: 12/07/20 Potential to Achieve Goals: Good Progress towards PT goals: Progressing toward goals    Frequency    Min 4X/week      PT Plan Current plan remains appropriate    Co-evaluation PT/OT/SLP Co-Evaluation/Treatment: Yes Reason for Co-Treatment: For patient/therapist safety;To address functional/ADL transfers PT goals addressed during session: Mobility/safety with mobility;Balance;Proper use of DME OT goals addressed during session: ADL's and self-care      AM-PAC PT "6 Clicks" Mobility   Outcome Measure  Help needed turning from your back to your side while in a flat bed without using bedrails?: A Little Help needed moving from lying on your back to sitting on the side of a flat bed without using bedrails?: A Little Help needed moving to and from a bed to a chair (including a wheelchair)?: A Lot Help needed  standing up from a chair using your arms (e.g., wheelchair or bedside chair)?: A Little Help needed to walk in hospital room?: A Lot Help needed climbing 3-5 steps with a railing? : A Lot 6 Click Score: 15    End of Session Equipment Utilized During Treatment: Gait belt Activity Tolerance: Patient tolerated treatment well Patient left: in chair;with call bell/phone within reach;with nursing/sitter in room Nurse Communication: Mobility status PT Visit Diagnosis: Other abnormalities of gait and mobility (R26.89);Difficulty in walking, not elsewhere classified (R26.2);Muscle weakness (generalized) (M62.81);Unsteadiness on feet (R26.81);Pain;History of falling (Z91.81)     Time: 7026-3785 PT Time Calculation (min) (ACUTE ONLY): 25 min  Charges:  $Gait Training: 8-22 mins                     Joaquina Nissen A. Dan Humphreys PT, DPT Acute Rehabilitation Services Pager [phone] Office [phone]    Viviann Spare 11/29/2020, 5:15 PM

## 2020-11-29 NOTE — Progress Notes (Signed)
Patient adamantly told NT sitter and I that he did not want his CBG and he also refused his Lovenox injection for the night.  I attempted to explain the importance of the Lovenox injection and the CBG checks but patient said "nah, I don't want that right now. Like please just leave me alone with that."

## 2020-11-30 ENCOUNTER — Inpatient Hospital Stay (HOSPITAL_COMMUNITY): Payer: Self-pay

## 2020-11-30 DIAGNOSIS — E876 Hypokalemia: Secondary | ICD-10-CM

## 2020-11-30 DIAGNOSIS — L89819 Pressure ulcer of head, unspecified stage: Secondary | ICD-10-CM

## 2020-11-30 LAB — GLUCOSE, CAPILLARY
Glucose-Capillary: 117 mg/dL — ABNORMAL HIGH (ref 70–99)
Glucose-Capillary: 110 mg/dL — ABNORMAL HIGH (ref 70–99)
Glucose-Capillary: 122 mg/dL — ABNORMAL HIGH (ref 70–99)
Glucose-Capillary: 108 mg/dL — ABNORMAL HIGH (ref 70–99)
Glucose-Capillary: 94 mg/dL (ref 70–99)
Glucose-Capillary: 126 mg/dL — ABNORMAL HIGH (ref 70–99)
Glucose-Capillary: 139 mg/dL — ABNORMAL HIGH (ref 70–99)

## 2020-11-30 NOTE — Progress Notes (Signed)
Inpatient Rehabilitation Admissions Coordinator  Patient up with nursing to recliner. I do not have a CIR bed for his admission. We will follow up next week.  Ottie Glazier, RN, MSN Rehab Admissions Coordinator [phone] 11/30/2020 1:29 PM

## 2020-11-30 NOTE — Progress Notes (Signed)
PROGRESS NOTE  Nathaniel Lynn HYQ:[id number] DOB: 1990/12/17 DOA: 11/10/2020 PCP: Pcp, No   LOS: 20 days   Brief narrative: Nathaniel Lynn is a 30 y.o. male with a history of narcotic abuse recently released from 2 year prison sentence was found in the reportedly getting high at his girlfriend's house on 5/14, taken to [facility] for evaluation. Labs notable for +UDS (amphetamines, opiates, cannabinoids), Cr 2.33 (baseline 0.8-1.2), CK level >50K, with lactic acid of 3.0. CT Head was negative for acute pathology.   In the ED, the patient was given Ativan x 2 for tonic-clonic seizure-like activity and was intubated shortly after seizure-like activity was noted. Concern for "posturing". Transferred to [facility] ICU for further evaluation and continuous EEG monitoring. Empiric antibiotics were given and withdrawn after unremarkable. LP performed 5/19 with acyclovir stopped after negative HSV. AKI improved with fluids. Tracheal aspirate grew stenotrophomonas and flavobacterium for which 10-day course of levaquin was started on 11/17/20. EEG demonstrated nonspecific moderate diffuse encephalopathy without epileptiform discharges. MRI brain suggested global hypoxic ischemic injury. Precedex was given for agitation and patient extubated 5/25. The patient was transferred to hospitalist service 10/29/20  still requiring prn sedation for agitation and having post-ATN diuresis and hypernatremia which has improved.  Patient also required a one-to-one sitter.   Assessment/Plan:  Active Problems:   Status epilepticus (HCC)   Acute respiratory failure with hypoxemia (HCC)   Encephalopathy   Pressure injury of skin   AKI (acute kidney injury) (HCC)   Aspiration pneumonia (HCC)   Hypernatremia  Acute toxic, metabolic encephalopathy and concern for now chronic hypoxic encephalopathy:  On initial presentation, UDS positive for amphetamines, THC, opiates. CSF unremarkable.  EEG with nonspecific findings.  MRI of the brain  showed nonspecific encephalopathy but repeat MRI showed evolution of hypoxic injury.  on seroquel  50mg  qAM, 100mg  qPM.   Oxycodone scheduled w/bowel regimen to prevent withdrawal.. Decreased in planned taper to 5mg  po BID on 5/30.  Continue thiamine, folic acid, multivitamin and clonidine taper.  Dose of Haldol has been decreased.  Was little agitated yesterday.  Was very somnolent this morning.  Acute hypoxic respiratory failure:  Due to encephalopathy, now extubated. Also due to Stenotrophomonas and flavobacterium indologenes tracheobronchitis.  Currently without any supplemental oxygen.  Completed 10-day course of Levaquin on 11/27/2020  Acute kidney injury secondary to ATN: Improving with supportive care.  Improved creatinine levels.  Latest creatinine of 1.06  Hypokalemia.  Replenished.  Check levels in a.m.  Latest magnesium of 2.0.  Hypernatremia: Resolved.  Latest sodium of 136  Rhabdomyolysis: Improved.    Demand myocardial ischemia:  Mild troponin elevation, 2D echocardiogram with no regional wall motion abnormalities.  Patient did not have any chest pain.  Essential hypertension Has been started on clonidine.  Blood pressure remains stable.  LFT elevation: Resolved.  Likely secondary to rhabdomyolysis.  IVDU, Polysubstance abuse:  TTE without any vegetations.   On clonidine taper.  Lactic acidosis: Resolved.  Moderate protein calorie malnutrition:  Present on admission.  Seen by nutrition service.  Continue nutritional supplementation on boost.  LUE superficial thrombosis, RUE/ RLE edema: Ultrasound of the upper extremities without DVT.  Edema has improved with diuresis.  Continue prophylactic Lovenox.  Thrombocytosis: Likely reactive in nature.  Max of 98.1 degree..  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  Continue wound care. Pressure Injury 11/20/20 Ear Left;Posterior Stage 3 -  Full thickness tissue loss. Subcutaneous fat may be visible but bone,  tendon or muscle are  NOT exposed. (Active)  11/20/20 1500  Location: Ear  Location Orientation: Left;Posterior  Staging: Stage 3 -  Full thickness tissue loss. Subcutaneous fat may be visible but bone, tendon or muscle are NOT exposed.  Wound Description (Comments):   Present on Admission: No     DVT prophylaxis: enoxaparin (LOVENOX) injection 40 mg Start: 11/27/20 2200 SCDs Start: 11/10/20 2317    Code Status: Full code  Family Communication: None  Status is: Inpatient  Remains inpatient appropriate because:Unsafe d/c plan, IV treatments appropriate due to intensity of illness or inability to take PO and Inpatient level of care appropriate due to severity of illness   Dispo: The patient is from: Home              Anticipated d/c is to: CIR              Patient currently is not medically stable to d/c.   Difficult to place patient No   Consultants:  PCCM  Procedures:  Intubation and mechanical ventilation  Anti-infectives:  . None currently  Subjective: Today, patient was seen and examined at bedside somnolent was agitated yesterday in has a Comptroller.  Very sleepy at the time of my exam.  Denies any pain.  objective: Vitals:   11/30/20 1003 11/30/20 1132  BP: 135/89 (!) 137/93  Pulse:  81  Resp:  18  Temp:  98 F (36.7 C)  SpO2:  100%    Intake/Output Summary (Last 24 hours) at 11/30/2020 1414 Last data filed at 11/30/2020 6712 Gross per 24 hour  Intake 960 ml  Output 1100 ml  Net -140 ml   Filed Weights   11/23/20 0419 11/26/20 0227 11/27/20 0500  Weight: 112.9 kg 84.1 kg 80.4 kg   Body mass index is 25.43 kg/m.   Physical Exam: General: Somnolent, slow to respond, not in obvious distress, deconditioned and appears ill HENT:   No scleral pallor or icterus noted. Oral mucosa is moist.  Chest:  Clear breath sounds.  Diminished breath sounds bilaterally. No crackles or wheezes.  CVS: S1 &S2 heard. No murmur.  Regular rate and rhythm. Abdomen: Soft,  nontender, nondistended.  Bowel sounds are heard.   Extremities: No cyanosis, clubbing or edema.  Peripheral pulses are palpable. Psych: Somnolent at the time of my exam CNS:  No cranial nerve deficits.  Moves extremities but generalized weakness noted Skin: Warm and dry.  No rashes noted.   Data Review: I have personally reviewed the following laboratory data and studies,  CBC: Recent Labs  Lab 11/24/20 0352 11/26/20 0444 11/29/20 0435  WBC 8.1 6.7 9.1  HGB 8.8* 9.7* 11.2*  HCT 27.3* 29.3* 32.7*  MCV 95.8 91.8 90.1  PLT 476* 499* 559*   Basic Metabolic Panel: Recent Labs  Lab 11/24/20 0352 11/25/20 0330 11/26/20 0444 11/27/20 0403 11/29/20 0435  NA 144 139 137 137 136  K 3.9 3.4* 3.7 3.7 3.4*  CL 111 108 104 104 104  CO2 25 24 25 24 24   GLUCOSE 99 102* 138* 117* 107*  BUN 38* 30* 30* 26* 21*  CREATININE 1.67* 1.59* 1.54* 1.40* 1.06  CALCIUM 8.8* 8.8* 9.2 9.5 9.4  MG 2.1  --   --   --  2.0  PHOS  --   --   --   --  3.8   Liver Function Tests: Recent Labs  Lab 11/25/20 0330 11/27/20 0403 11/29/20 0435  AST 30 26 24   ALT 60* 46* 30  ALKPHOS 55 60 63  BILITOT 0.6 0.6 0.7  PROT 5.6* 7.2 7.4  ALBUMIN 2.6* 3.3* 3.7   No results for input(s): LIPASE, AMYLASE in the last 168 hours. No results for input(s): AMMONIA in the last 168 hours. Cardiac Enzymes: Recent Labs  Lab 11/27/20 0403  CKTOTAL 159   BNP (last 3 results) No results for input(s): BNP in the last 8760 hours.  ProBNP (last 3 results) No results for input(s): PROBNP in the last 8760 hours.  CBG: Recent Labs  Lab 11/29/20 1949 11/30/20 0059 11/30/20 0355 11/30/20 0757 11/30/20 1132  GLUCAP 130* 117* 110* 122* 108*   No results found for this or any previous visit (from the past 240 hour(s)).   Studies: No results found.    Joycelyn Das, MD  [facility] 11/30/2020  If 7PM-7AM, please contact night-coverage

## 2020-12-01 LAB — CBC
HCT: 35.4 % — ABNORMAL LOW (ref 39.0–52.0)
Hemoglobin: 12.3 g/dL — ABNORMAL LOW (ref 13.0–17.0)
MCH: 30.4 pg (ref 26.0–34.0)
MCHC: 34.7 g/dL (ref 30.0–36.0)
MCV: 87.6 fL (ref 80.0–100.0)
Platelets: 596 10*3/uL — ABNORMAL HIGH (ref 150–400)
RBC: 4.04 MIL/uL — ABNORMAL LOW (ref 4.22–5.81)
RDW: 12.4 % (ref 11.5–15.5)
WBC: 10.6 10*3/uL — ABNORMAL HIGH (ref 4.0–10.5)
nRBC: 0 % (ref 0.0–0.2)

## 2020-12-01 LAB — BASIC METABOLIC PANEL
Anion gap: 9 (ref 5–15)
BUN: 15 mg/dL (ref 6–20)
CO2: 26 mmol/L (ref 22–32)
Calcium: 9.5 mg/dL (ref 8.9–10.3)
Chloride: 100 mmol/L (ref 98–111)
Creatinine, Ser: 0.88 mg/dL (ref 0.61–1.24)
GFR, Estimated: >60 mL/min (ref >60)
Glucose, Bld: 114 mg/dL — ABNORMAL HIGH (ref 70–99)
Potassium: 3.6 mmol/L (ref 3.5–5.1)
Sodium: 135 mmol/L (ref 135–145)

## 2020-12-01 LAB — GLUCOSE, CAPILLARY
Glucose-Capillary: 136 mg/dL — ABNORMAL HIGH (ref 70–99)
Glucose-Capillary: 127 mg/dL — ABNORMAL HIGH (ref 70–99)
Glucose-Capillary: 99 mg/dL (ref 70–99)
Glucose-Capillary: 138 mg/dL — ABNORMAL HIGH (ref 70–99)
Glucose-Capillary: 126 mg/dL — ABNORMAL HIGH (ref 70–99)
Glucose-Capillary: 121 mg/dL — ABNORMAL HIGH (ref 70–99)

## 2020-12-01 LAB — MAGNESIUM: Magnesium: 2 mg/dL (ref 1.7–2.4)

## 2020-12-01 LAB — PHOSPHORUS: Phosphorus: 3.5 mg/dL (ref 2.5–4.6)

## 2020-12-01 NOTE — Progress Notes (Signed)
PROGRESS NOTE  Nathaniel Lynn ZOX:[id number] DOB: July 28, 1990 DOA: 11/10/2020 PCP: Pcp, No   LOS: 21 days   Brief narrative: Nathaniel Lynn is a 30 y.o. male with a history of narcotic abuse recently released from 2 year prison sentence was found in the reportedly getting high at his girlfriend's house on 5/14, taken to [facility] for evaluation. Labs notable for +UDS (amphetamines, opiates, cannabinoids), Cr 2.33 (baseline 0.8-1.2), CK level >50K, with lactic acid of 3.0. CT Head was negative for acute pathology.   In the ED, the patient was given Ativan x 2 for tonic-clonic seizure-like activity and was intubated shortly after seizure-like activity was noted. Concern for "posturing". Transferred to [facility] ICU for further evaluation and continuous EEG monitoring. Empiric antibiotics were given and withdrawn after unremarkable. LP performed 5/19 with acyclovir stopped after negative HSV. AKI improved with fluids. Tracheal aspirate grew stenotrophomonas and flavobacterium for which 10-day course of levaquin was started on 11/17/20. EEG demonstrated nonspecific moderate diffuse encephalopathy without epileptiform discharges. MRI brain suggested global hypoxic ischemic injury. Precedex was given for agitation and patient extubated 5/25. The patient was transferred to hospitalist service 10/29/20  still requiring prn sedation for agitation and having post-ATN diuresis and hypernatremia which has improved.  Patient also required a one-to-one sitter.   Assessment/Plan:  Active Problems:   Status epilepticus (HCC)   Acute respiratory failure with hypoxemia (HCC)   Encephalopathy   Pressure injury of skin   AKI (acute kidney injury) (HCC)   Aspiration pneumonia (HCC)   Hypernatremia  Acute toxic, metabolic encephalopathy and concern for now chronic hypoxic encephalopathy:  On initial presentation, UDS positive for amphetamines, THC, opiates. CSF unremarkable.  EEG with nonspecific findings.  MRI of the brain  showed nonspecific encephalopathy but repeat MRI showed evolution of hypoxic injury.  on seroquel  50mg  qAM, 100mg  qPM.   Oxycodone scheduled w/bowel regimen to prevent withdrawal.. Decreased in planned taper to 5mg  po BID on 5/30.  Continue thiamine, folic acid, multivitamin and clonidine taper.  Dose of Haldol has been decreased.  She had little agitation yesterday but this morning he is appears to be more alert awake and comfortable.   Acute hypoxic respiratory failure: Currently without any supplemental oxygen.  Completed 10-day course of Levaquin on 11/27/2020  Acute kidney injury secondary to ATN: Improving with supportive care.  Improved creatinine levels.  Latest creatinine of 1.06  Hypokalemia.  Replenished.  Check BMP in AM.  Hypernatremia: Resolved.  Latest sodium of 136  Rhabdomyolysis: Improved.   Demand myocardial ischemia:  Mild troponin elevation, 2D echocardiogram with no regional wall motion abnormalities.  Patient did not have any chest pain.  Essential hypertension Closely monitor on clonidine.  LFT elevation: Resolved.  Likely secondary to rhabdomyolysis.  IVDU, Polysubstance abuse:  TTE without any vegetations.   On clonidine taper.  Lactic acidosis: Resolved.  Moderate protein calorie malnutrition:  Present on admission.  Seen by nutrition service.  Continue nutritional supplementation on boost.  LUE superficial thrombosis, RUE/ RLE edema: Ultrasound of the upper extremities without DVT.  Edema has improved with diuresis.  Continue prophylactic Lovenox.  Thrombocytosis: Likely reactive in nature.  Temperature max of 98.7 F  Pressure injury stage III on the left ear.  Continue wound care. Pressure Injury 11/20/20 Ear Left;Posterior Stage 3 -  Full thickness tissue loss. Subcutaneous fat may be visible but bone, tendon or muscle are NOT exposed. (Active)  11/20/20 1500  Location: Ear  Location Orientation: Left;Posterior  Staging: Stage 3 -  Full thickness tissue loss. Subcutaneous fat may be visible but bone, tendon or muscle are NOT exposed.  Wound Description (Comments):   Present on Admission: No     DVT prophylaxis: enoxaparin (LOVENOX) injection 40 mg Start: 11/27/20 2200 SCDs Start: 11/10/20 2317   Code Status: Full code  Family Communication:  None  Status is: Inpatient  Remains inpatient appropriate because:Unsafe d/c plan, IV treatments appropriate due to intensity of illness or inability to take PO and Inpatient level of care appropriate due to severity of illness   Dispo: The patient is from: Home              Anticipated d/c is to: CIR              Patient currently is not medically stable to d/c.   Difficult to place patient No   Consultants:  PCCM  Procedures:  Intubation and mechanical ventilation  Anti-infectives:  . None currently  Subjective:  Today, patient was seen and examined at bedside.  Appears to be more alert awake and calm this morning.  Denies any pain, nausea, vomiting.  Had little agitation yesterday.  Sitter at bedside  objective: Vitals:   12/01/20 0414 12/01/20 0753  BP: (!) 147/99 (!) 146/95  Pulse: (!) 104 98  Resp: 17 18  Temp: 97.7 F (36.5 C) 98.2 F (36.8 C)  SpO2: 100% 100%    Intake/Output Summary (Last 24 hours) at 12/01/2020 0837 Last data filed at 12/01/2020 6010 Gross per 24 hour  Intake 600 ml  Output 2300 ml  Net -1700 ml   Filed Weights   11/23/20 0419 11/26/20 0227 11/27/20 0500  Weight: 112.9 kg 84.1 kg 80.4 kg   Body mass index is 25.43 kg/m.   Physical Exam:  General: Alert and awake and communicative today.  Not in obvious distress, deconditioned and appears ill HENT:   No scleral pallor or icterus noted. Oral mucosa is moist.  Chest:  Clear breath sounds.  Diminished breath sounds bilaterally. No crackles or wheezes.  CVS: S1 &S2 heard. No murmur.  Regular rate and rhythm. Abdomen: Soft, nontender, nondistended.  Bowel sounds are  heard.   Extremities: No cyanosis, clubbing or edema.  Peripheral pulses are palpable. Psych: Awake and communicative, CNS:  No cranial nerve deficits.  Moves extremities but generalized weakness noted Skin: Warm and dry.  No rashes noted.   Data Review: I have personally reviewed the following laboratory data and studies,  CBC: Recent Labs  Lab 11/26/20 0444 11/29/20 0435  WBC 6.7 9.1  HGB 9.7* 11.2*  HCT 29.3* 32.7*  MCV 91.8 90.1  PLT 499* 559*   Basic Metabolic Panel: Recent Labs  Lab 11/25/20 0330 11/26/20 0444 11/27/20 0403 11/29/20 0435  NA 139 137 137 136  K 3.4* 3.7 3.7 3.4*  CL 108 104 104 104  CO2 24 25 24 24   GLUCOSE 102* 138* 117* 107*  BUN 30* 30* 26* 21*  CREATININE 1.59* 1.54* 1.40* 1.06  CALCIUM 8.8* 9.2 9.5 9.4  MG  --   --   --  2.0  PHOS  --   --   --  3.8   Liver Function Tests: Recent Labs  Lab 11/25/20 0330 11/27/20 0403 11/29/20 0435  AST 30 26 24   ALT 60* 46* 30  ALKPHOS 55 60 63  BILITOT 0.6 0.6 0.7  PROT 5.6* 7.2 7.4  ALBUMIN 2.6* 3.3* 3.7   No results for input(s): LIPASE, AMYLASE in the last 168 hours. No  results for input(s): AMMONIA in the last 168 hours. Cardiac Enzymes: Recent Labs  Lab 11/27/20 0403  CKTOTAL 159   BNP (last 3 results) No results for input(s): BNP in the last 8760 hours.  ProBNP (last 3 results) No results for input(s): PROBNP in the last 8760 hours.  CBG: Recent Labs  Lab 11/30/20 1646 11/30/20 2013 11/30/20 2307 12/01/20 0401 12/01/20 0801  GLUCAP 94 126* 139* 136* 127*   No results found for this or any previous visit (from the past 240 hour(s)).   Studies: DG Foot 2 Views Right  Result Date: 11/30/2020 CLINICAL DATA:  Right foot pain.  Fall. EXAM: RIGHT FOOT - 2 VIEW COMPARISON:  None. FINDINGS: Two views of the right foot were obtained. There is a bandage with opaque markers overlying the foot. Alignment of the foot is within normal limits. No evidence for fracture or dislocation.  IMPRESSION: No acute abnormality in the right foot. Please note that a true AP view was not obtained of the right foot and if there is high concern for a right foot injury, recommend additional imaging with removal of the bandage. Electronically Signed   By: Richarda Overlie M.D.   On: 11/30/2020 16:59      Joycelyn Das, MD  [facility] 12/01/2020  If 7PM-7AM, please contact night-coverage

## 2020-12-01 NOTE — Progress Notes (Signed)
Pt refused  Lab to be drawn from the PICC line

## 2020-12-02 LAB — GLUCOSE, CAPILLARY
Glucose-Capillary: 111 mg/dL — ABNORMAL HIGH (ref 70–99)
Glucose-Capillary: 117 mg/dL — ABNORMAL HIGH (ref 70–99)
Glucose-Capillary: 104 mg/dL — ABNORMAL HIGH (ref 70–99)
Glucose-Capillary: 122 mg/dL — ABNORMAL HIGH (ref 70–99)
Glucose-Capillary: 169 mg/dL — ABNORMAL HIGH (ref 70–99)

## 2020-12-02 NOTE — Progress Notes (Signed)
PROGRESS NOTE  RAMIR MALERBA VOZ:[id number] DOB: 25-Nov-1990 DOA: 11/10/2020 PCP: Pcp, No   LOS: 22 days   Brief narrative: Nathaniel Lynn is a 30 y.o. male with a history of narcotic abuse recently released from 2 year prison sentence was found in the reportedly getting high at his girlfriend's house on 5/14, taken to [facility] for evaluation. Labs notable for +UDS (amphetamines, opiates, cannabinoids), Cr 2.33 (baseline 0.8-1.2), CK level >50K, with lactic acid of 3.0. CT Head was negative for acute pathology.   In the ED, the patient was given Ativan x 2 for tonic-clonic seizure-like activity and was intubated shortly after seizure-like activity was noted. Concern for "posturing". Transferred to [facility] ICU for further evaluation and continuous EEG monitoring. Empiric antibiotics were given and withdrawn after unremarkable. LP performed 5/19 with acyclovir stopped after negative HSV. AKI improved with fluids. Tracheal aspirate grew stenotrophomonas and flavobacterium for which 10-day course of levaquin was started on 11/17/20. EEG demonstrated nonspecific moderate diffuse encephalopathy without epileptiform discharges. MRI brain suggested global hypoxic ischemic injury. Precedex was given for agitation and patient extubated 5/25. The patient was transferred to hospitalist service 10/29/20  still requiring prn sedation for agitation and having post-ATN diuresis and hypernatremia which has improved.  Patient also required a one-to-one sitter.   Assessment/Plan:  Active Problems:   Status epilepticus (HCC)   Acute respiratory failure with hypoxemia (HCC)   Encephalopathy   Pressure injury of skin   AKI (acute kidney injury) (HCC)   Aspiration pneumonia (HCC)   Hypernatremia  Acute toxic, metabolic encephalopathy and concern for now chronic hypoxic encephalopathy:   On initial presentation, UDS positive for amphetamines, THC, opiates. CSF unremarkable.  EEG with nonspecific findings.  MRI of the  brain showed nonspecific encephalopathy but repeat MRI showed evolution of hypoxic injury.  We will continue on seroquel  50mg  qAM, 100mg  qPM.   Oxycodone scheduled w/bowel regimen to prevent withdrawal.. Decreased in planned taper to 5mg  po BID on 5/30.  Continue thiamine, folic acid, multivitamin and clonidine taper.  Dose of Haldol has been decreased.  No one-to-one sitter at this morning.  Appears to be more alert awake  Acute hypoxic respiratory failure: Currently without any supplemental oxygen.  Completed 10-day course of Levaquin on 11/27/2020  Acute kidney injury secondary to ATN: Improving with supportive care.  Improved creatinine levels.  Latest creatinine of 0.8.  Hypokalemia.  Replenished.  Latest potassium of 3.6  Hypernatremia: Resolved.  Latest sodium of 135  Rhabdomyolysis: Improved.   Demand myocardial ischemia:  Mild troponin elevation, 2D echocardiogram with no regional wall motion abnormalities.  Patient did not have any chest pain.  Essential hypertension Closely monitor on clonidine.  Distal blood pressure of 138/99  LFT elevation: Resolved.  Likely secondary to rhabdomyolysis.  IVDU, Polysubstance abuse:  TTE without any vegetations.   On clonidine taper.  Lactic acidosis: Resolved.  Moderate protein calorie malnutrition:  Present on admission.  Seen by nutrition service.  Continue nutritional supplementation on boost.  LUE superficial thrombosis, RUE/ RLE edema: Ultrasound of the upper extremities without DVT.  Edema has improved with diuresis.  Continue prophylactic Lovenox.  Thrombocytosis: Likely reactive in nature.  Temperature max of 98.7 F  Pressure injury stage III on the left ear.  Continue wound care. Pressure Injury 11/20/20 Ear Left;Posterior Stage 3 -  Full thickness tissue loss. Subcutaneous fat may be visible but bone, tendon or muscle are NOT exposed. (Active)  11/20/20 1500  Location: Ear  Location Orientation: Left;Posterior  Staging: Stage 3 -  Full thickness tissue loss. Subcutaneous fat may be visible but bone, tendon or muscle are NOT exposed.  Wound Description (Comments):   Present on Admission: No     DVT prophylaxis: enoxaparin (LOVENOX) injection 40 mg Start: 11/27/20 2200 SCDs Start: 11/10/20 2317   Code Status: Full code  Family Communication:  None  Status is: Inpatient  Remains inpatient appropriate because:Unsafe d/c plan, IV treatments appropriate due to intensity of illness or inability to take PO and Inpatient level of care appropriate due to severity of illness   Dispo: The patient is from: Home              Anticipated d/c is to: CIR              Patient currently is not medically stable to d/c.   Difficult to place patient No   Consultants:  PCCM  Procedures:  Intubation and mechanical ventilation  Anti-infectives:  . None currently  Subjective:  Today, patient was seen and examined at bedside.  He is alert awake denies any pain, nausea, vomiting.  No sitter at bedside at the time of my exam.  objective: Vitals:   12/02/20 0408 12/02/20 1024  BP: (!) 133/123 (!) 152/102  Pulse: 100 100  Resp: 18 17  Temp: 98.6 F (37 C) 98.3 F (36.8 C)  SpO2: 100% 100%    Intake/Output Summary (Last 24 hours) at 12/02/2020 1309 Last data filed at 12/01/2020 1700 Gross per 24 hour  Intake 320 ml  Output --  Net 320 ml   Filed Weights   11/26/20 0227 11/27/20 0500 12/02/20 0420  Weight: 84.1 kg 80.4 kg 76.6 kg   Body mass index is 24.23 kg/m.   Physical Exam:  General:  Average built, not in obvious distress, deconditioned and appears ill HENT:   No scleral pallor or icterus noted. Oral mucosa is moist.  Chest:  Clear breath sounds.  Diminished breath sounds bilaterally. No crackles or wheezes.  CVS: S1 &S2 heard. No murmur.  Regular rate and rhythm. Abdomen: Soft, nontender, nondistended.  Bowel sounds are heard.   Extremities: No cyanosis, clubbing or edema.   Peripheral pulses are palpable. Psych: Alert, awake and oriented, normal mood CNS:  No cranial nerve deficits.  Moves extremities but generalized weakness noted. Skin: Warm and dry.  No rashes noted.   Data Review: I have personally reviewed the following laboratory data and studies,  CBC: Recent Labs  Lab 11/26/20 0444 11/29/20 0435 12/01/20 0950  WBC 6.7 9.1 10.6*  HGB 9.7* 11.2* 12.3*  HCT 29.3* 32.7* 35.4*  MCV 91.8 90.1 87.6  PLT 499* 559* 596*   Basic Metabolic Panel: Recent Labs  Lab 11/26/20 0444 11/27/20 0403 11/29/20 0435 12/01/20 0950  NA 137 137 136 135  K 3.7 3.7 3.4* 3.6  CL 104 104 104 100  CO2 25 24 24 26   GLUCOSE 138* 117* 107* 114*  BUN 30* 26* 21* 15  CREATININE 1.54* 1.40* 1.06 0.88  CALCIUM 9.2 9.5 9.4 9.5  MG  --   --  2.0 2.0  PHOS  --   --  3.8 3.5   Liver Function Tests: Recent Labs  Lab 11/27/20 0403 11/29/20 0435  AST 26 24  ALT 46* 30  ALKPHOS 60 63  BILITOT 0.6 0.7  PROT 7.2 7.4  ALBUMIN 3.3* 3.7   No results for input(s): LIPASE, AMYLASE in the last 168 hours. No results for input(s): AMMONIA in the last 168  hours. Cardiac Enzymes: Recent Labs  Lab 11/27/20 0403  CKTOTAL 159   BNP (last 3 results) No results for input(s): BNP in the last 8760 hours.  ProBNP (last 3 results) No results for input(s): PROBNP in the last 8760 hours.  CBG: Recent Labs  Lab 12/01/20 1919 12/01/20 2306 12/02/20 0309 12/02/20 0802 12/02/20 1217  GLUCAP 126* 121* 111* 117* 104*   No results found for this or any previous visit (from the past 240 hour(s)).   Studies: DG Foot 2 Views Right  Result Date: 11/30/2020 CLINICAL DATA:  Right foot pain.  Fall. EXAM: RIGHT FOOT - 2 VIEW COMPARISON:  None. FINDINGS: Two views of the right foot were obtained. There is a bandage with opaque markers overlying the foot. Alignment of the foot is within normal limits. No evidence for fracture or dislocation. IMPRESSION: No acute abnormality in the  right foot. Please note that a true AP view was not obtained of the right foot and if there is high concern for a right foot injury, recommend additional imaging with removal of the bandage. Electronically Signed   By: Richarda Overlie M.D.   On: 11/30/2020 16:59      Joycelyn Das, MD  [facility] 12/02/2020  If 7PM-7AM, please contact night-coverage

## 2020-12-03 LAB — GLUCOSE, CAPILLARY
Glucose-Capillary: 106 mg/dL — ABNORMAL HIGH (ref 70–99)
Glucose-Capillary: 112 mg/dL — ABNORMAL HIGH (ref 70–99)
Glucose-Capillary: 113 mg/dL — ABNORMAL HIGH (ref 70–99)
Glucose-Capillary: 120 mg/dL — ABNORMAL HIGH (ref 70–99)
Glucose-Capillary: 126 mg/dL — ABNORMAL HIGH (ref 70–99)
Glucose-Capillary: 135 mg/dL — ABNORMAL HIGH (ref 70–99)

## 2020-12-03 MED ORDER — INSULIN ASPART 100 UNIT/ML IJ SOLN: 0.0000 [IU] | Freq: Three times a day (TID) | INTRAMUSCULAR | Status: DC

## 2020-12-03 MED ORDER — ACETAMINOPHEN 325 MG PO TABS
650.0000 mg | ORAL_TABLET | Freq: Four times a day (QID) | ORAL | Status: DC | PRN
  Administered 2020-12-03 – 2020-12-06 (×5): 650 mg via ORAL
  Filled 2020-12-03 (×7): qty 2

## 2020-12-03 NOTE — Plan of Care (Signed)
  Problem: Safety: Goal: Non-violent Restraint(s) Outcome: Progressing   Problem: Education: Goal: Knowledge of General Education information will improve Description: Including pain rating scale, medication(s)/side effects and non-pharmacologic comfort measures Outcome: Progressing   Problem: Health Behavior/Discharge Planning: Goal: Ability to manage health-related needs will improve Outcome: Progressing   Problem: Clinical Measurements: Goal: Ability to maintain clinical measurements within normal limits will improve Outcome: Progressing Goal: Will remain free from infection Outcome: Progressing Goal: Diagnostic test results will improve Outcome: Progressing Goal: Respiratory complications will improve Outcome: Progressing Goal: Cardiovascular complication will be avoided Outcome: Progressing   Problem: Activity: Goal: Risk for activity intolerance will decrease Outcome: Progressing   Problem: Coping: Goal: Level of anxiety will decrease Outcome: Progressing   Problem: Pain Managment: Goal: General experience of comfort will improve Outcome: Progressing   Problem: Safety: Goal: Ability to remain free from injury will improve Outcome: Progressing   Problem: Skin Integrity: Goal: Risk for impaired skin integrity will decrease Outcome: Progressing

## 2020-12-03 NOTE — Progress Notes (Signed)
Physical Therapy Treatment Patient Details Name: Nathaniel Lynn MRN: [id number] DOB: 06/26/1991 Today's Date: 12/03/2020    History of Present Illness 30 y.o. male admitted to [facility] on 11/10/20 after being found down reportedly "getting high" at girlfriend's house; (+) UDS (amphetamines, opiates, cannabinoids). Head CT negative for acute pathology. Pt with tonic-clonic seizure-like activity in ED, intubated with transfer to [facility] ICU. Unremarkable LP 5/19. EEG demonstrated nonspecific moderate diffuse encephalopathy without epileptiform discharges. 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 ETT 5/14-5/25. Course complicated by intermittent agitation. Other workup for AKI, rhabdomyolysis, HTN. PMH includes substance abuse.    PT Comments    Pt required supervision bed mobility, min assist transfers, and +2 mod assist ambulation 160' HHA vs RW. Pt presents with unsteady gait. Pt with c/o RLE, but reports improvement in pain with gait/mobility. Pt returned to bed at end of session. Sitter present in room.    Follow Up Recommendations  CIR     Equipment Recommendations  Wheelchair (measurements PT);Rolling walker with 5" wheels;3in1 (PT);Wheelchair cushion (measurements PT);Other (comment)    Recommendations for Other Services       Precautions / Restrictions Precautions Precautions: Fall Precaution Comments: R-side weakness    Mobility  Bed Mobility Overal bed mobility: Needs Assistance Bed Mobility: Supine to Sit;Sit to Supine     Supine to sit: Supervision Sit to supine: Supervision   General bed mobility comments: supervision for safety    Transfers Overall transfer level: Needs assistance Equipment used: Ambulation equipment used Transfers: Sit to/from Stand Sit to Stand: +2 safety/equipment;Min assist         General transfer comment: assist to power up and stabilize balance  Ambulation/Gait Ambulation/Gait  assistance: Mod assist;+2 physical assistance;+2 safety/equipment Gait Distance (Feet): 160 Feet Assistive device: Rolling walker (2 wheeled);1 person hand held assist Gait Pattern/deviations: Narrow base of support;Step-to pattern;Step-through pattern;Decreased stride length;Decreased step length - right;Decreased weight shift to right;Scissoring Gait velocity: decreased Gait velocity interpretation: <1.31 ft/sec, indicative of household ambulator General Gait Details: initiated gait +2 HHA then progressed to +1 HHA. After 100' transitioned to RW with good improvement in gait quality:increased step length and toe clearance bilat. Continual multimodal cues for sequencing and posture.   Stairs             Wheelchair Mobility    Modified Rankin (Stroke Patients Only) Modified Rankin (Stroke Patients Only) Pre-Morbid Rankin Score: No symptoms Modified Rankin: Moderately severe disability     Balance Overall balance assessment: Needs assistance Sitting-balance support: Feet supported;No upper extremity supported Sitting balance-Leahy Scale: Good     Standing balance support: Single extremity supported;Bilateral upper extremity supported;During functional activity Standing balance-Leahy Scale: Poor Standing balance comment: reliant on external assist                            Cognition Arousal/Alertness: Awake/alert Behavior During Therapy: Flat affect Overall Cognitive Status: Impaired/Different from baseline Area of Impairment: Following commands;Safety/judgement;Problem solving;Attention;Awareness;Memory                   Current Attention Level: Selective Memory: Decreased short-term memory;Decreased recall of precautions Following Commands: Follows one step commands consistently;Follows multi-step commands with increased time;Follows one step commands with increased time;Follows multi-step commands inconsistently Safety/Judgement: Decreased awareness  of safety;Decreased awareness of deficits Awareness: Intellectual Problem Solving: Slow processing;Decreased initiation;Difficulty sequencing;Requires verbal cues;Requires tactile cues General Comments: Pt continually asking "when do you think I  can go home?"      Exercises      General Comments General comments (skin integrity, edema, etc.): VSS      Pertinent Vitals/Pain Pain Assessment: Faces Faces Pain Scale: Hurts little more Pain Location: RLE Pain Descriptors / Indicators: Discomfort;Grimacing Pain Intervention(s): Monitored during session;Repositioned    Home Living                      Prior Function            PT Goals (current goals can now be found in the care plan section) Acute Rehab PT Goals Patient Stated Goal: home Progress towards PT goals: Progressing toward goals    Frequency    Min 4X/week      PT Plan Current plan remains appropriate    Co-evaluation              AM-PAC PT "6 Clicks" Mobility   Outcome Measure  Help needed turning from your back to your side while in a flat bed without using bedrails?: A Little Help needed moving from lying on your back to sitting on the side of a flat bed without using bedrails?: A Little Help needed moving to and from a bed to a chair (including a wheelchair)?: A Little Help needed standing up from a chair using your arms (e.g., wheelchair or bedside chair)?: A Little Help needed to walk in hospital room?: A Lot Help needed climbing 3-5 steps with a railing? : A Lot 6 Click Score: 16    End of Session Equipment Utilized During Treatment: Gait belt Activity Tolerance: Patient tolerated treatment well Patient left: in bed;with call bell/phone within reach;with nursing/sitter in room Nurse Communication: Mobility status PT Visit Diagnosis: Other abnormalities of gait and mobility (R26.89);Difficulty in walking, not elsewhere classified (R26.2);Muscle weakness (generalized)  (M62.81);Unsteadiness on feet (R26.81);Pain;History of falling (Z91.81) Pain - Right/Left: Right Pain - part of body: Leg     Time: 6599-3570 PT Time Calculation (min) (ACUTE ONLY): 14 min  Charges:  $Gait Training: 8-22 mins                     Aida Raider, PT  Office # [phone] Pager [phone]    Ilda Foil 12/03/2020, 12:44 PM

## 2020-12-03 NOTE — Progress Notes (Signed)
Inpatient Rehab Admissions Coordinator:   I do not have a bed for this Pt. Today. I spoke with Pt's mother who confirmed that Pt.'s sister will be home with Pt. For 24/7 support and reviewed cost estimate with her. I will follow for potential admit pending bed availability and insurance auth.   Megan Salon, MS, CCC-SLP Rehab Admissions Coordinator  [phone] (celll) [phone] (office)

## 2020-12-03 NOTE — Consult Note (Addendum)
WOC Nurse wound follow up Pt previously was noted to have a Stage 3 pressure injury to the left ear.  This is healing and is now .2X2X.1cm, pink moist and shallow.  Continue Bactroban to promote healing. WOC team will reassess next week. Cammie Mcgee MSN, RN, CWOCN, [place], CNS [phone]

## 2020-12-03 NOTE — Progress Notes (Signed)
PROGRESS NOTE  Nathaniel Lynn PJA:[id number] DOB: 1990-12-04 DOA: 11/10/2020 PCP: Pcp, No   LOS: 23 days   Brief narrative: Nathaniel Lynn is a 30 y.o. male with a history of narcotic abuse recently released from 2 year prison sentence was found in the reportedly getting high at his girlfriend's house on 5/14, taken to [facility] for evaluation. Labs notable for +UDS (amphetamines, opiates, cannabinoids), Cr 2.33 (baseline 0.8-1.2), CK level >50K, with lactic acid of 3.0. CT Head was negative for acute pathology.   In the ED, the patient was given Ativan x 2 for tonic-clonic seizure-like activity and was intubated shortly after seizure-like activity was noted. Concern for "posturing". Transferred to [facility] ICU for further evaluation and continuous EEG monitoring. Empiric antibiotics were given and withdrawn after unremarkable. LP performed 5/19 with acyclovir stopped after negative HSV. AKI improved with fluids. Tracheal aspirate grew stenotrophomonas and flavobacterium for which 10-day course of levaquin was started on 11/17/20. EEG demonstrated nonspecific moderate diffuse encephalopathy without epileptiform discharges. MRI brain suggested global hypoxic ischemic injury. Precedex was given for agitation and patient extubated 5/25. The patient was transferred to hospitalist service 10/29/20  still requiring prn sedation for agitation and having post-ATN diuresis and hypernatremia which has improved.  Patient also required a one-to-one sitter.   Assessment/Plan:  Active Problems:   Status epilepticus (HCC)   Acute respiratory failure with hypoxemia (HCC)   Encephalopathy   Pressure injury of skin   AKI (acute kidney injury) (HCC)   Aspiration pneumonia (HCC)   Hypernatremia  Acute toxic, metabolic encephalopathy and concern for now chronic hypoxic encephalopathy:  on seroquel  50mg  qAM, 100mg  qPM.  On chronic oxycodone.  Continue thiamine, folic acid, multivitamin and clonidine taper.  Has sitter at  bedside.  Appears to be more somnolent today  Acute hypoxic respiratory failure: Resolved.  On room air..  Completed 10-day course of Levaquin on 11/27/2020  Acute kidney injury secondary to ATN: Resolved.  Latest creatinine of 0.8  Hypokalemia.  Replenished.  Latest potassium of 3.6  Hypernatremia: Resolved.  Latest sodium of 135  Rhabdomyolysis: Improved.   Demand myocardial ischemia:  Mild troponin elevation, 2D echocardiogram with no regional wall motion abnormalities.  No chest pain or significant EKG changes.  Essential hypertension Closely monitor on clonidine.  Latest blood pressure of 144/85  IVDU, Polysubstance abuse:  TTE without any vegetations.   On clonidine taper.  Lactic acidosis: Resolved.  Moderate protein calorie malnutrition:  Present on admission.  Seen by nutrition service.  Continue nutritional supplementation on boost.  LUE superficial thrombosis, RUE/ RLE edema: Ultrasound of the upper extremities without DVT.on prophylactic Lovenox.  Thrombocytosis: Likely reactive in nature.    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  Continue wound care. Pressure Injury 11/20/20 Ear Left;Posterior Stage 3 -  Full thickness tissue loss. Subcutaneous fat may be visible but bone, tendon or muscle are NOT exposed. (Active)  11/20/20 1500  Location: Ear  Location Orientation: Left;Posterior  Staging: Stage 3 -  Full thickness tissue loss. Subcutaneous fat may be visible but bone, tendon or muscle are NOT exposed.  Wound Description (Comments):   Present on Admission: No     DVT prophylaxis: enoxaparin (LOVENOX) injection 40 mg Start: 11/27/20 2200 SCDs Start: 11/10/20 2317   Code Status: Full code  Family Communication:  None  Status is: Inpatient  Remains inpatient appropriate because:Unsafe d/c plan, IV treatments appropriate due to intensity of illness or inability to take PO and Inpatient level of  care appropriate due to severity of illness,  need for rehabilitation placement.   Dispo: The patient is from: Home              Anticipated d/c is to: CIR              Patient currently is not medically stable to d/c.   Difficult to place patient No   Consultants:  PCCM  Procedures:  Intubation and mechanical ventilation  Anti-infectives:  . None currently  Subjective:  Today, patient was seen and examined at bedside.  Mildly somnolent but denies any pain, nausea, vomiting.  Sitter at bedside.  objective: Vitals:   12/03/20 0404 12/03/20 0700  BP: (!) 155/102 (!) 144/85  Pulse: 97 88  Resp: 18 16  Temp: 98.2 F (36.8 C) 98.3 F (36.8 C)  SpO2: 100% 100%    Intake/Output Summary (Last 24 hours) at 12/03/2020 1116 Last data filed at 12/03/2020 0648 Gross per 24 hour  Intake --  Output 1050 ml  Net -1050 ml   Filed Weights   11/26/20 0227 11/27/20 0500 12/02/20 0420  Weight: 84.1 kg 80.4 kg 76.6 kg   Body mass index is 24.23 kg/m.   Physical Exam:  General:  Average built, not in obvious distress, mildly somnolent but awake to verbal command,  HENT:   No scleral pallor or icterus noted. Oral mucosa is moist.  Chest:  Clear breath sounds.  Diminished breath sounds bilaterally. No crackles or wheezes.  CVS: S1 &S2 heard. No murmur.  Regular rate and rhythm. Abdomen: Soft, nontender, nondistended.  Bowel sounds are heard.   Extremities: No cyanosis, clubbing or edema.  Peripheral pulses are palpable. Psych: Somnolent, awake on verbal command CNS: Mildly somnolent, moves all extremities. Skin: Warm and dry.  No rashes noted.   Data Review: I have personally reviewed the following laboratory data and studies,  CBC: Recent Labs  Lab 11/29/20 0435 12/01/20 0950  WBC 9.1 10.6*  HGB 11.2* 12.3*  HCT 32.7* 35.4*  MCV 90.1 87.6  PLT 559* 596*   Basic Metabolic Panel: Recent Labs  Lab 11/27/20 0403 11/29/20 0435 12/01/20 0950  NA 137 136 135  K 3.7 3.4* 3.6  CL 104 104 100  CO2 24 24 26    GLUCOSE 117* 107* 114*  BUN 26* 21* 15  CREATININE 1.40* 1.06 0.88  CALCIUM 9.5 9.4 9.5  MG  --  2.0 2.0  PHOS  --  3.8 3.5   Liver Function Tests: Recent Labs  Lab 11/27/20 0403 11/29/20 0435  AST 26 24  ALT 46* 30  ALKPHOS 60 63  BILITOT 0.6 0.7  PROT 7.2 7.4  ALBUMIN 3.3* 3.7   No results for input(s): LIPASE, AMYLASE in the last 168 hours. No results for input(s): AMMONIA in the last 168 hours. Cardiac Enzymes: Recent Labs  Lab 11/27/20 0403  CKTOTAL 159   BNP (last 3 results) No results for input(s): BNP in the last 8760 hours.  ProBNP (last 3 results) No results for input(s): PROBNP in the last 8760 hours.  CBG: Recent Labs  Lab 12/02/20 1551 12/02/20 1937 12/03/20 0006 12/03/20 0402 12/03/20 0716  GLUCAP 122* 169* 113* 112* 106*   No results found for this or any previous visit (from the past 240 hour(s)).   Studies: No results found.    02/02/21, MD  [facility] 12/03/2020  If 7PM-7AM, please contact night-coverage

## 2020-12-03 NOTE — Progress Notes (Signed)
PICC Dressing change done under sterile technique

## 2020-12-03 NOTE — Progress Notes (Addendum)
Pt refusing to keep Tele ON, educated but still refusing, will attempt later, MD on call aware

## 2020-12-04 LAB — GLUCOSE, CAPILLARY
Glucose-Capillary: 121 mg/dL — ABNORMAL HIGH (ref 70–99)
Glucose-Capillary: 101 mg/dL — ABNORMAL HIGH (ref 70–99)
Glucose-Capillary: 118 mg/dL — ABNORMAL HIGH (ref 70–99)
Glucose-Capillary: 98 mg/dL (ref 70–99)

## 2020-12-04 NOTE — Progress Notes (Signed)
PROGRESS NOTE  Nathaniel Lynn DOB: 05-22-1991 DOA: 11/10/2020 PCP: Pcp, No   LOS: 24 days   Brief narrative: Nathaniel Lynn is a 30 y.o. male with a history of narcotic abuse recently released from 2 year prison sentence was found in the reportedly getting high at his girlfriend's house on 5/14, taken to [facility] for evaluation. Labs notable for +UDS (amphetamines, opiates, cannabinoids), Cr 2.33 (baseline 0.8-1.2), CK level >50K, with lactic acid of 3.0. CT Head was negative for acute pathology.   In the ED, the patient was given Ativan x 2 for tonic-clonic seizure-like activity and was intubated shortly after seizure-like activity was noted. Concern for "posturing". Transferred to [facility] ICU for further evaluation and continuous EEG monitoring. Empiric antibiotics were given and withdrawn after unremarkable. LP performed 5/19 with acyclovir stopped after negative HSV. AKI improved with fluids. Tracheal aspirate grew stenotrophomonas and flavobacterium for which 10-day course of levaquin was started on 11/17/20. EEG demonstrated nonspecific moderate diffuse encephalopathy without epileptiform discharges. MRI brain suggested global hypoxic ischemic injury. Precedex was given for agitation and patient extubated 5/25. The patient was transferred to hospitalist service 10/29/20  still requiring prn sedation for agitation and having post-ATN diuresis and hypernatremia which has improved.  Patient also required a one-to-one sitter.   Assessment/Plan:  Active Problems:   Status epilepticus (HCC)   Acute respiratory failure with hypoxemia (HCC)   Encephalopathy   Pressure injury of skin   AKI (acute kidney injury) (HCC)   Aspiration pneumonia (HCC)   Hypernatremia  Acute toxic, metabolic encephalopathy and concern for now chronic hypoxic encephalopathy:   on seroquel  50mg  qAM, 100mg  qPM.  On chronic oxycodone.  Continue thiamine, folic acid, multivitamin and clonidine taper.  Has sitter  at bedside.  Appears to be sleeping today.  Acute hypoxic respiratory failure: Resolved.  On room air.  Completed 10-day course of Levaquin on 11/27/2020  Acute kidney injury secondary to ATN: Resolved.  Latest creatinine of 0.8  Hypokalemia.  Replenished.  Latest potassium of 3.6  Hypernatremia: Resolved.  Latest sodium of 135  Rhabdomyolysis: Improved.   Demand myocardial ischemia:  Mild troponin elevation, 2D echocardiogram with no regional wall motion abnormalities.  No chest pain or significant EKG changes.  Essential hypertension Closely monitor on clonidine.  Latest blood pressure of 144/85  IVDU, Polysubstance abuse:  TTE without any vegetations.   On clonidine taper.  Lactic acidosis: Resolved.  Moderate protein calorie malnutrition:  Present on admission.  Seen by nutrition service.  Continue nutritional supplementation on boost.  LUE superficial thrombosis, RUE/ RLE edema: Ultrasound of the upper extremities without DVT.on prophylactic Lovenox.  Thrombocytosis: Likely reactive in nature.    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  Continue wound care. Pressure Injury 11/20/20 Ear Left;Posterior Stage 3 -  Full thickness tissue loss. Subcutaneous fat may be visible but bone, tendon or muscle are NOT exposed. (Active)  11/20/20 1500  Location: Ear  Location Orientation: Left;Posterior  Staging: Stage 3 -  Full thickness tissue loss. Subcutaneous fat may be visible but bone, tendon or muscle are NOT exposed.  Wound Description (Comments):   Present on Admission: No     DVT prophylaxis: enoxaparin (LOVENOX) injection 40 mg Start: 11/27/20 2200 SCDs Start: 11/10/20 2317   Code Status: Full code  Family Communication:  None  Status is: Inpatient  Remains inpatient appropriate because:Unsafe d/c plan, IV treatments appropriate due to intensity of illness or inability to take PO and Inpatient level of  care appropriate due to severity of illness,  need for rehabilitation placement.   Dispo: The patient is from: Home              Anticipated d/c is to: CIR              Patient currently is not medically stable to d/c.   Difficult to place patient No   Consultants:  PCCM  Procedures:  Intubation and mechanical ventilation  Anti-infectives:  . None currently  Subjective:  Today, patient was seen and examined at bedside.  Mildly somnolent but denies any pain, nausea, vomiting.  Sitter at bedside.  objective: Vitals:   12/04/20 0358 12/04/20 1124  BP: (!) 137/94 (!) 137/94  Pulse: (!) 108 99  Resp: 20 18  Temp: 98.6 F (37 C) 98.7 F (37.1 C)  SpO2: 100%     Intake/Output Summary (Last 24 hours) at 12/04/2020 1131 Last data filed at 12/03/2020 1700 Gross per 24 hour  Intake 120 ml  Output 600 ml  Net -480 ml   Filed Weights   11/27/20 0500 12/02/20 0420 12/04/20 0500  Weight: 80.4 kg 76.6 kg 76.1 kg   Body mass index is 24.07 kg/m.   Physical Exam:  General:  Average built, not in obvious distress, mildly somnolent HENT:   No scleral pallor or icterus noted. Oral mucosa is moist.  Chest:  Clear breath sounds.  Diminished breath sounds bilaterally. No crackles or wheezes.  CVS: S1 &S2 heard. No murmur.  Regular rate and rhythm. Abdomen: Soft, nontender, nondistended.  Bowel sounds are heard.   Extremities: No cyanosis, clubbing or edema.  Peripheral pulses are palpable. Psych: Somnolent, responds to verbal commands CNS:  No cranial nerve deficits.  Power equal in all extremities.   Skin: Warm and dry.  No rashes noted.   Data Review: I have personally reviewed the following laboratory data and studies,  CBC: Recent Labs  Lab 11/29/20 0435 12/01/20 0950  WBC 9.1 10.6*  HGB 11.2* 12.3*  HCT 32.7* 35.4*  MCV 90.1 87.6  PLT 559* 596*   Basic Metabolic Panel: Recent Labs  Lab 11/29/20 0435 12/01/20 0950  NA 136 135  K 3.4* 3.6  CL 104 100  CO2 24 26  GLUCOSE 107* 114*  BUN 21* 15   CREATININE 1.06 0.88  CALCIUM 9.4 9.5  MG 2.0 2.0  PHOS 3.8 3.5   Liver Function Tests: Recent Labs  Lab 11/29/20 0435  AST 24  ALT 30  ALKPHOS 63  BILITOT 0.7  PROT 7.4  ALBUMIN 3.7   No results for input(s): LIPASE, AMYLASE in the last 168 hours. No results for input(s): AMMONIA in the last 168 hours. Cardiac Enzymes: No results for input(s): CKTOTAL, CKMB, CKMBINDEX, TROPONINI in the last 168 hours. BNP (last 3 results) No results for input(s): BNP in the last 8760 hours.  ProBNP (last 3 results) No results for input(s): PROBNP in the last 8760 hours.  CBG: Recent Labs  Lab 12/03/20 1213 12/03/20 1559 12/03/20 1951 12/04/20 0635 12/04/20 1118  GLUCAP 120* 135* 126* 121* 101*   No results found for this or any previous visit (from the past 240 hour(s)).   Studies: No results found.    Joycelyn Das, MD  [facility] 12/04/2020  If 7PM-7AM, please contact night-coverage

## 2020-12-04 NOTE — Progress Notes (Signed)
  Speech Language Pathology Treatment: Cognitive-Linquistic  Patient Details Name: Nathaniel Lynn MRN: [id number] DOB: Jun 15, 1991 Today's Date: 12/04/2020 Time: 1829-9371 SLP Time Calculation (min) (ACUTE ONLY): 15 min  Assessment / Plan / Recommendation Clinical Impression  Pt asleep upon SLP arrival, but promptly awoke and consented to skilled cognitive therapy this afternoon. Speech deficits appear to have resolved and pt primarily demonstrates deficits in higher level cognitive functions, including recall memory and problem solving. When provided safety scenarios regarding health management at home, pt provided solution to a given problem with 60% accuracy with min-mod verbal/question cues. Pt repeated same question in regards to status of him going home 2-3x and question impaired short term memory for newly provided information. SLP to continue to follow acutely to address problem solving/safety and provide diagnostic treatment in regards to memory and executive functions.   HPI HPI: 30 y.o. male admitted to [facility] on 11/10/20 after being found down reportedly "getting high" at girlfriend's house; (+) UDS (amphetamines, opiates, cannabinoids). Head CT negative for acute pathology. Pt with tonic-clonic seizure-like activity in ED, intubated with transfer to [facility] ICU. Unremarkable LP 5/19. EEG demonstrated nonspecific moderate diffuse encephalopathy without epileptiform discharges. 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 ETT 5/14-5/25. Course complicated by intermittent agitation. Other workup for AKI, rhabdomyolysis, HTN. PMH includes substance abuse.      SLP Plan  Continue with current plan of care       Recommendations                   Follow up Recommendations: Inpatient Rehab SLP Visit Diagnosis: Cognitive communication deficit (I96.789) Plan: Continue with current plan of care       GO              Avie Echevaria, MA, CCC-SLP Acute Rehabilitation Services Office Number: [phone]   Paulette Blanch 12/04/2020, 2:53 PM

## 2020-12-04 NOTE — Progress Notes (Signed)
Occupational Therapy Treatment Patient Details Name: Nathaniel Lynn MRN: [id number] DOB: 1990/10/28 Today's Date: 12/04/2020    History of present illness 30 y.o. male admitted to [facility] on 11/10/20 after being found down reportedly "getting high" at girlfriend's house; (+) UDS (amphetamines, opiates, cannabinoids). Head CT negative for acute pathology. Pt with tonic-clonic seizure-like activity in ED, intubated with transfer to [facility] ICU. Unremarkable LP 5/19. EEG demonstrated nonspecific moderate diffuse encephalopathy without epileptiform discharges. 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 ETT 5/14-5/25. Course complicated by intermittent agitation. Other workup for AKI, rhabdomyolysis, HTN. PMH includes substance abuse.   OT comments  Pt progressing towards established OT goals. Continues to present with motivation for returning home and PLOF. Pt continues to demonstrate poor safety awareness, problem solving, executive functioning, attention, and insight. Pt requiring increased encouragement this session as he was distracted by pain at RLE. Pt donning socks with Min A. Pt performing oral care at sink with close Min Guard A and functional mobility with RW and Min A. Continue to highly recommend dc to CIR and will continue to follow acutely as admitted.    Follow Up Recommendations  CIR;Supervision/Assistance - 24 hour    Equipment Recommendations  Other (comment) (RW)    Recommendations for Other Services Rehab consult    Precautions / Restrictions Precautions Precautions: Fall Precaution Comments: R-side weakness       Mobility Bed Mobility Overal bed mobility: Needs Assistance Bed Mobility: Supine to Sit;Sit to Supine     Supine to sit: Supervision Sit to supine: Supervision   General bed mobility comments: supervision for safety    Transfers Overall transfer level: Needs assistance Equipment used: Ambulation  equipment used Transfers: Sit to/from Stand Sit to Stand: Min guard         General transfer comment: Min Guard A for safety with power up. continues to present with impulsivity and poor safety    Balance Overall balance assessment: Needs assistance Sitting-balance support: Feet supported;No upper extremity supported Sitting balance-Leahy Scale: Good Sitting balance - Comments: requires supervision for safety   Standing balance support: Single extremity supported;Bilateral upper extremity supported;During functional activity Standing balance-Leahy Scale: Poor Standing balance comment: reliant on external assist                           ADL either performed or assessed with clinical judgement   ADL Overall ADL's : Needs assistance/impaired     Grooming: Min guard;Cueing for safety;Standing Grooming Details (indicate cue type and reason): Min Guard A for safety in standing due to decreased balance and awareness. Pt maintaining standing at sink to compelte oral care.             Lower Body Dressing: Minimal assistance;Sit to/from stand Lower Body Dressing Details (indicate cue type and reason): Min A for initating donning sock on R foot. Toilet Transfer: Min guard;Ambulation;RW (simulated to at [facility]) Toilet Transfer Details (indicate cue type and reason): Min guard A for safety         Functional mobility during ADLs: Minimal assistance;Rolling walker General ADL Comments: Pt requiring increased engaouragement this session due pain and pt with decreased future thinking and executive functioning. Pt agreeable to oral care at sink     Vision       Perception     Praxis      Cognition Arousal/Alertness: Awake/alert Behavior During Therapy: [facility] for tasks assessed/performed Overall Cognitive Status:  Impaired/Different from baseline Area of Impairment: Following commands;Safety/judgement;Problem solving;Attention;Awareness;Memory;Orientation                  Orientation Level: Disoriented to;Time Current Attention Level: Selective Memory: Decreased short-term memory;Decreased recall of precautions Following Commands: Follows one step commands consistently;Follows multi-step commands with increased time;Follows one step commands with increased time;Follows multi-step commands inconsistently Safety/Judgement: Decreased awareness of safety;Decreased awareness of deficits Awareness: Intellectual Problem Solving: Slow processing;Decreased initiation;Difficulty sequencing;Requires verbal cues;Requires tactile cues General Comments: Pt demonstrating increased attention. Requiring increased time and cues. Continues to present iwht poor executive functioning, awareness, and problem solving.        Exercises     Shoulder Instructions       General Comments Played Luke Combs during session    Pertinent Vitals/ Pain       Pain Assessment: 0-10 Pain Score: 9  Pain Location: RLE Pain Descriptors / Indicators: Discomfort;Grimacing Pain Intervention(s): Monitored during session;Limited activity within patient's tolerance;Repositioned  Home Living                                          Prior Functioning/Environment              Frequency  Min 2X/week        Progress Toward Goals  OT Goals(current goals can now be found in the care plan section)  Progress towards OT goals: Progressing toward goals  Acute Rehab OT Goals Patient Stated Goal: home Time For Goal Achievement: 12/07/20 Potential to Achieve Goals: Good ADL Goals Pt Will Perform Grooming: sitting;with min guard assist Pt Will Perform Upper Body Bathing: sitting;with min assist Pt Will Transfer to Toilet: with +2 assist;stand pivot transfer;bedside commode;with min assist Additional ADL Goal #1: Pt will follow 2 step commands with 75% accuracy given increased time to optimize participation in ADLs. Additional ADL Goal #2: Pt will complete bed  mobility with min guard assist and maintain sitting balance with no more than min assist as precursor to ADLs.  Plan Discharge plan remains appropriate    Co-evaluation                 AM-PAC OT "6 Clicks" Daily Activity     Outcome Measure   Help from another person eating meals?: A Lot Help from another person taking care of personal grooming?: A Little Help from another person toileting, which includes using toliet, bedpan, or urinal?: A Lot Help from another person bathing (including washing, rinsing, drying)?: A Lot Help from another person to put on and taking off regular upper body clothing?: A Lot Help from another person to put on and taking off regular lower body clothing?: A Lot 6 Click Score: 13    End of Session Equipment Utilized During Treatment: Gait belt;Rolling walker  OT Visit Diagnosis: Other abnormalities of gait and mobility (R26.89);Muscle weakness (generalized) (M62.81);Other symptoms and signs involving the nervous system (R29.898);Other symptoms and signs involving cognitive function;Cognitive communication deficit (R41.841);Pain Symptoms and signs involving cognitive functions: Cerebral infarction Pain - part of body:  (generalized)   Activity Tolerance Patient tolerated treatment well   Patient Left with call bell/phone within reach;in bed;with nursing/sitter in room Psychiatrist)   Nurse Communication Mobility status;Precautions        Time: 9532-0233 OT Time Calculation (min): 18 min  Charges: OT General Charges $OT Visit: 1 Visit OT Treatments $Self Care/Home Management : 8-22 mins  Welda Azzarello MSOT, OTR/L Acute Rehab Pager: [phone] Office: [phone]   Theodoro Grist Rhianon Zabawa 12/04/2020, 1:27 PM

## 2020-12-04 NOTE — Progress Notes (Signed)
Inpatient Rehab Admissions Coordinator:   I do not have a bed for this Pt. On CIR today. He appears medically ready for CIR, so I will follow for potential admit pending insurance auth and bed availability.  Megan Salon, MS, CCC-SLP Rehab Admissions Coordinator  [phone] (celll) [phone] (office)

## 2020-12-04 NOTE — Progress Notes (Signed)
PT Cancellation Note  Patient Details Name: Nathaniel Lynn MRN: [id number] DOB: 06-21-1991   Cancelled Treatment:    Reason Eval/Treat Not Completed: Fatigue/lethargy limiting ability to participate. Pt sleeping soundly. Unable to rouse for therapy. Pt unwilling to open eyes. PT to re-attempt as time allows.   Ilda Foil 12/04/2020, 9:09 AM  Aida Raider, PT  Office # [phone] Pager [phone]

## 2020-12-04 NOTE — Progress Notes (Signed)
Nutrition Follow-up  DOCUMENTATION CODES:   Not applicable  INTERVENTION:  -Continue Boost Breeze po TID, each supplement provides 250 kcal and 9 grams of protein -Continue 109ml Prosource Plus po BID, each supplement provides 100 kcals and 15 grams of protein -Continue MVI with minerals daily -Continue Magic cup TID with meals, each supplement provides 290 kcal and 9 grams of protein -Continue to encourage adequate PO intake -Staff should encourage adequate intake.   If pt's appetite/intake do not improve, recommend placement of NGT/Cortrak to help pt meet kcal/protein needs.   NUTRITION DIAGNOSIS:   Inadequate oral intake related to acute illness as evidenced by meal completion < 50%.  ongoing  GOAL:   Patient will meet greater than or equal to 90% of their needs  progressing  MONITOR:   PO intake,Supplement acceptance  REASON FOR ASSESSMENT:   Consult Enteral/tube feeding initiation and management  ASSESSMENT:   Pt with PMH of polysubstance abuse recently released from prison admitted after being found down admitted with acute metabolic encephalopathy and AKI due to rhabdomyolysis.  5/25 extubated 5/26 diet advanced  5/28 tx to hospitalist service  Per MD, pt is medically stable for d/c to CIR. Now pending insurance authorization/bed availability.  Pt provided no responses to RD questions; too lethargic/drowsy. Discussed pt with RN who reports pt with continued poor appetite and supplement consumption. Pt accepting supplements at times, but frequently declining as well. Staff should encourage adequate intake. If pt's appetite/intake do not improve, recommend placement of NGT/Cortrak to help pt meet kcal/protein needs.   PO Intake: 0-100% x last 7 recorded meals (35% average meal intake)  UOP: x24 hours  Medications: . (feeding supplement) PROSource Plus  30 mL Oral BID BM  . docusate sodium  100 mg Oral BID  . enoxaparin (LOVENOX) injection  40 mg  Subcutaneous Q24H  . feeding supplement  1 Container Oral TID BM  . folic acid  1 mg Oral Daily  . insulin aspart  0-6 Units Subcutaneous TID AC & HS  . mouth rinse  15 mL Mouth Rinse BID  . multivitamin with minerals  1 tablet Oral Daily  . mupirocin cream   Topical Daily  . oxyCODONE  5 mg Oral BID  . polyethylene glycol  17 g Oral Daily  . polyvinyl alcohol  1 drop Both Eyes TID  . QUEtiapine  100 mg Oral QHS  . QUEtiapine  50 mg Oral q AM  . sodium chloride flush  10-40 mL Intracatheter Q12H  . thiamine  100 mg Oral Daily  Labs: Recent Labs  Lab 11/29/20 0435 12/01/20 0950  NA 136 135  K 3.4* 3.6  CL 104 100  CO2 24 26  BUN 21* 15  CREATININE 1.06 0.88  CALCIUM 9.4 9.5  MG 2.0 2.0  PHOS 3.8 3.5  GLUCOSE 107* 114*  CBGs 101-121  Diet Order:   Diet Order            DIET SOFT Room service appropriate? Yes; Fluid consistency: Thin  Diet effective now                 EDUCATION NEEDS:   No education needs have been identified at this time  Skin:  Skin Assessment: Skin Integrity Issues: Skin Integrity Issues:: Stage III Stage III: ear  Last BM:  6/2  Height:   Ht Readings from Last 1 Encounters:  11/10/20 5\' 10"  (1.778 m)    Weight:   Wt Readings from Last 1 Encounters:  12/04/20 76.1 kg   BMI:  Body mass index is 24.07 kg/m.  Estimated Nutritional Needs:   Kcal:  2300-2500  Protein:  120-135 grams  Fluid:  >2.3 L/day    Eugene Gavia, MS, RD, LDN RD pager number and weekend/on-call pager number located in Amion.

## 2020-12-05 LAB — GLUCOSE, CAPILLARY
Glucose-Capillary: 109 mg/dL — ABNORMAL HIGH (ref 70–99)
Glucose-Capillary: 106 mg/dL — ABNORMAL HIGH (ref 70–99)
Glucose-Capillary: 113 mg/dL — ABNORMAL HIGH (ref 70–99)
Glucose-Capillary: 175 mg/dL — ABNORMAL HIGH (ref 70–99)

## 2020-12-05 NOTE — Progress Notes (Signed)
PROGRESS NOTE  AD GUTTMAN ZOX:[id number] DOB: 11-25-1990 DOA: 11/10/2020 PCP: Pcp, No   LOS: 25 days   Brief narrative: Nathaniel Lynn is a 30 y.o. male with a history of narcotic abuse recently released from 2 year prison sentence was found in the reportedly getting high at his girlfriend's house on 5/14, taken to [facility] for evaluation. Labs notable for +UDS (amphetamines, opiates, cannabinoids), Cr 2.33 (baseline 0.8-1.2), CK level >50K, with lactic acid of 3.0. CT Head was negative for acute pathology.   In the ED, the patient was given Ativan x 2 for tonic-clonic seizure-like activity and was intubated shortly after seizure-like activity was noted. Concern for "posturing". Transferred to [facility] ICU for further evaluation and continuous EEG monitoring. Empiric antibiotics were given and withdrawn after unremarkable. LP performed 5/19 with acyclovir stopped after negative HSV. AKI improved with fluids. Tracheal aspirate grew stenotrophomonas and flavobacterium for which 10-day course of levaquin was started on 11/17/20. EEG demonstrated nonspecific moderate diffuse encephalopathy without epileptiform discharges. MRI brain suggested global hypoxic ischemic injury. Precedex was given for agitation and patient extubated 5/25. The patient was transferred to hospitalist service 10/29/20  still requiring prn sedation for agitation and having post-ATN diuresis and hypernatremia which has improved.  Patient also required a one-to-one sitter.  Assessment/Plan:  Active Problems:   Status epilepticus (HCC)   Acute respiratory failure with hypoxemia (HCC)   Encephalopathy   Pressure injury of skin   AKI (acute kidney injury) (HCC)   Aspiration pneumonia (HCC)   Hypernatremia  Acute toxic, metabolic encephalopathy and concern for now chronic hypoxic encephalopathy:  On seroquel  50mg  qAM, 100mg  qPM.  On chronic oxycodone.  Continue thiamine, folic acid, multivitamin and clonidine taper.  Has sitter at  bedside.  Still somnolent.  Acute hypoxic respiratory failure: Resolved.  On room air.  Completed 10-day course of Levaquin on 11/27/2020  Acute kidney injury secondary to ATN: Resolved.  Latest creatinine of 0.8  Hypokalemia.  Replenished.  Latest potassium of 3.6  Hypernatremia: Resolved.  Latest sodium of 135  Rhabdomyolysis: Improved.   Demand myocardial ischemia:  Mild troponin elevation, 2D echocardiogram with no regional wall motion abnormalities.  No chest pain or significant EKG changes.  Essential hypertension Closely monitor on clonidine.  Latest blood pressure of 144/85  IVDU, Polysubstance abuse:  TTE without any vegetations.   On clonidine taper.  Lactic acidosis: Resolved.  Moderate protein calorie malnutrition:  Present on admission.  Seen by nutrition service.  Continue nutritional supplementation on boost.  LUE superficial thrombosis, RUE/ RLE edema: Ultrasound of the upper extremities without DVT. On prophylactic Lovenox.  Thrombocytosis: Likely reactive in nature.    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  Continue wound care.  Pressure Injury 11/20/20 Ear Left;Posterior Stage 3 -  Full thickness tissue loss. Subcutaneous fat may be visible but bone, tendon or muscle are NOT exposed. (Active)  11/20/20 1500  Location: Ear  Location Orientation: Left;Posterior  Staging: Stage 3 -  Full thickness tissue loss. Subcutaneous fat may be visible but bone, tendon or muscle are NOT exposed.  Wound Description (Comments):   Present on Admission: No    DVT prophylaxis: enoxaparin (LOVENOX) injection 40 mg Start: 11/27/20 2200 SCDs Start: 11/10/20 2317   Code Status: Full code  Family Communication:  I spoke with the patient's mother and updated her on the clinical condition of the patient.  Status is: Inpatient  Remains inpatient appropriate because:Unsafe d/c plan, IV treatments appropriate due to intensity  of illness or inability to take  PO and Inpatient level of care appropriate due to severity of illness, need for rehabilitation placement.  Dispo: The patient is from: Home              Anticipated d/c is to: CIR              Patient currently is not medically stable to d/c.   Difficult to place patient No   Consultants:  PCCM  Procedures:  Intubation and mechanical ventilation  Anti-infectives:  . None currently  Subjective:  Today, patient was seen and examined at bedside.  Still somnolent.  No interval complaints.  objective: Vitals:   12/05/20 0044 12/05/20 0353  BP: (!) 135/91 (!) 138/97  Pulse: (!) 101 100  Resp: 16 17  Temp: 98 F (36.7 C) 98.5 F (36.9 C)  SpO2: 97% 100%    Intake/Output Summary (Last 24 hours) at 12/05/2020 1057 Last data filed at 12/04/2020 1300 Gross per 24 hour  Intake 240 ml  Output --  Net 240 ml   Filed Weights   12/02/20 0420 12/04/20 0500 12/05/20 0500  Weight: 76.6 kg 76.1 kg 77.7 kg   Body mass index is 24.58 kg/m.   Physical Exam:  General:  Average built, not in obvious distress, somnolent HENT:   No scleral pallor or icterus noted. Oral mucosa is moist.  Chest:  Clear breath sounds.  Diminished breath sounds bilaterally. No crackles or wheezes.  CVS: S1 &S2 heard. No murmur.  Regular rate and rhythm. Abdomen: Soft, nontender, nondistended.  Bowel sounds are heard.   Extremities: No cyanosis, clubbing or edema.  Peripheral pulses are palpable. Psych: Somnolent responds to verbal commands CNS:  No cranial nerve deficits.  Power equal in all extremities.   Skin: Warm and dry.  No rashes noted.   Data Review: I have personally reviewed the following laboratory data and studies,  CBC: Recent Labs  Lab 11/29/20 0435 12/01/20 0950  WBC 9.1 10.6*  HGB 11.2* 12.3*  HCT 32.7* 35.4*  MCV 90.1 87.6  PLT 559* 596*   Basic Metabolic Panel: Recent Labs  Lab 11/29/20 0435 12/01/20 0950  NA 136 135  K 3.4* 3.6  CL 104 100  CO2 24 26  GLUCOSE 107*  114*  BUN 21* 15  CREATININE 1.06 0.88  CALCIUM 9.4 9.5  MG 2.0 2.0  PHOS 3.8 3.5   Liver Function Tests: Recent Labs  Lab 11/29/20 0435  AST 24  ALT 30  ALKPHOS 63  BILITOT 0.7  PROT 7.4  ALBUMIN 3.7   No results for input(s): LIPASE, AMYLASE in the last 168 hours. No results for input(s): AMMONIA in the last 168 hours. Cardiac Enzymes: No results for input(s): CKTOTAL, CKMB, CKMBINDEX, TROPONINI in the last 168 hours. BNP (last 3 results) No results for input(s): BNP in the last 8760 hours.  ProBNP (last 3 results) No results for input(s): PROBNP in the last 8760 hours.  CBG: Recent Labs  Lab 12/04/20 0635 12/04/20 1118 12/04/20 1634 12/04/20 2112 12/05/20 0627  GLUCAP 121* 101* 118* 98 109*   No results found for this or any previous visit (from the past 240 hour(s)).   Studies: No results found.    Joycelyn Das, MD  [facility] 12/05/2020  If 7PM-7AM, please contact night-coverage

## 2020-12-05 NOTE — Progress Notes (Signed)
Pt continues to refuse tele monitor, also constipation tx, e.g. miralax, colace

## 2020-12-05 NOTE — Plan of Care (Signed)
°  Problem: Safety: °Goal: Non-violent Restraint(s) °Outcome: Progressing °  °Problem: Education: °Goal: Knowledge of General Education information will improve °Description: Including pain rating scale, medication(s)/side effects and non-pharmacologic comfort measures °Outcome: Progressing °  °Problem: Health Behavior/Discharge Planning: °Goal: Ability to manage health-related needs will improve °Outcome: Progressing °  °Problem: Clinical Measurements: °Goal: Ability to maintain clinical measurements within normal limits will improve °Outcome: Progressing °Goal: Will remain free from infection °Outcome: Progressing °Goal: Diagnostic test results will improve °Outcome: Progressing °Goal: Respiratory complications will improve °Outcome: Progressing °Goal: Cardiovascular complication will be avoided °Outcome: Progressing °  °Problem: Activity: °Goal: Risk for activity intolerance will decrease °Outcome: Progressing °  °Problem: Nutrition: °Goal: Adequate nutrition will be maintained °Outcome: Progressing °  °Problem: Coping: °Goal: Level of anxiety will decrease °Outcome: Progressing °  °Problem: Pain Managment: °Goal: General experience of comfort will improve °Outcome: Progressing °  °Problem: Safety: °Goal: Ability to remain free from injury will improve °Outcome: Progressing °  °Problem: Skin Integrity: °Goal: Risk for impaired skin integrity will decrease °Outcome: Progressing °  °

## 2020-12-05 NOTE — Progress Notes (Signed)
Pt sleeping, covering head with blanket, refusing to respond to RN, NT, refusing vitals, educated but still refusing, will attempt later, MD on call aware.

## 2020-12-05 NOTE — Progress Notes (Signed)
Physical Therapy Treatment Patient Details Name: Nathaniel VANRIPER MRN: [id number] DOB: 1990-12-04 Today's Date: 12/05/2020    History of Present Illness 30 y.o. male admitted to [facility] on 11/10/20 after being found down reportedly "getting high" at girlfriend's house; (+) UDS (amphetamines, opiates, cannabinoids). Head CT negative for acute pathology. Pt with tonic-clonic seizure-like activity in ED, intubated with transfer to [facility] ICU. Unremarkable LP 5/19. EEG demonstrated nonspecific moderate diffuse encephalopathy without epileptiform discharges. 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 ETT 5/14-5/25. Course complicated by intermittent agitation. Other workup for AKI, rhabdomyolysis, HTN. PMH includes substance abuse.    PT Comments    Patient initially resistant to therapy session, however with encouragement, patient agreeable. Patient continues to demonstrate poor safety awareness, difficulty problem solving, and decreased awareness of deficits. Patient fixated on R LE pain initially but improved with mobility. Patient requires minA for ambulation with RW for balance and RW management as patient has increased difficulty navigating uncontrolled environment. Performed dynamic reaching tasks with no UE support and minA to steady. Continue to recommend comprehensive inpatient rehab (CIR) for post-acute therapy needs.     Follow Up Recommendations  CIR     Equipment Recommendations  Wheelchair (measurements PT);Rolling Lupe Handley with 5" wheels;3in1 (PT);Wheelchair cushion (measurements PT);Other (comment)    Recommendations for Other Services       Precautions / Restrictions Precautions Precautions: Fall Precaution Comments: R-side weakness and lower leg pain Restrictions Weight Bearing Restrictions: No    Mobility  Bed Mobility Overal bed mobility: Needs Assistance Bed Mobility: Supine to Sit;Sit to Supine     Supine to sit:  Supervision Sit to supine: Supervision   General bed mobility comments: supervision for safety    Transfers Overall transfer level: Needs assistance Equipment used: Rolling Jakita Dutkiewicz (2 wheeled) Transfers: Sit to/from Stand Sit to Stand: Min assist         General transfer comment: minA to rise and steady into standing. Cues for hand placement with RW. Sit to stand x 5 during session  Ambulation/Gait Ambulation/Gait assistance: Min assist Gait Distance (Feet): 120 Feet Assistive device: Rolling Ziyan Schoon (2 wheeled) Gait Pattern/deviations: Narrow base of support;Step-to pattern;Step-through pattern;Decreased stride length;Decreased step length - right;Decreased weight shift to right;Decreased dorsiflexion - right Gait velocity: decreased   General Gait Details: Cues for heel strike on R with poor follow through as patient fixated on R lower leg pain with ambulation and unwililng to attempt. MinA for balance and RW management at times. Difficulty problem solving navigation around obstacles in a uncontrolled environment   Stairs             Wheelchair Mobility    Modified Rankin (Stroke Patients Only) Modified Rankin (Stroke Patients Only) Pre-Morbid Rankin Score: No symptoms Modified Rankin: Moderately severe disability     Balance Overall balance assessment: Needs assistance Sitting-balance support: Feet supported;No upper extremity supported Sitting balance-Leahy Scale: Good Sitting balance - Comments: requires supervision for safety   Standing balance support: Bilateral upper extremity supported;During functional activity Standing balance-Leahy Scale: Poor Standing balance comment: reliant on external assist                            Cognition Arousal/Alertness: Awake/alert Behavior During Therapy: WFL for tasks assessed/performed Overall Cognitive Status: Impaired/Different from baseline Area of Impairment: Following  commands;Safety/judgement;Problem solving;Attention;Awareness;Memory;Orientation                 Orientation Level:  Disoriented to;Time Current Attention Level: Selective Memory: Decreased short-term memory;Decreased recall of precautions Following Commands: Follows one step commands consistently;Follows multi-step commands with increased time;Follows one step commands with increased time;Follows multi-step commands inconsistently Safety/Judgement: Decreased awareness of safety;Decreased awareness of deficits Awareness: Intellectual Problem Solving: Slow processing;Decreased initiation;Difficulty sequencing;Requires verbal cues;Requires tactile cues General Comments: Poor awareness of deficits and safety requiring cues and assist for safety.Difficulty problem solving navigating around obstacles with RW      Exercises Other Exercises Other Exercises: Dynamic reaching tasks with no UE support and minA for outside of BOS.    General Comments        Pertinent Vitals/Pain Pain Assessment: Faces Faces Pain Scale: Hurts little more Pain Location: RLE Pain Descriptors / Indicators: Discomfort;Grimacing Pain Intervention(s): Monitored during session;Repositioned    Home Living                      Prior Function            PT Goals (current goals can now be found in the care plan section) Acute Rehab PT Goals Patient Stated Goal: home PT Goal Formulation: With patient Time For Goal Achievement: 12/07/20 Potential to Achieve Goals: Good Progress towards PT goals: Progressing toward goals    Frequency    Min 4X/week      PT Plan Current plan remains appropriate    Co-evaluation              AM-PAC PT "6 Clicks" Mobility   Outcome Measure  Help needed turning from your back to your side while in a flat bed without using bedrails?: A Little Help needed moving from lying on your back to sitting on the side of a flat bed without using bedrails?: A  Little Help needed moving to and from a bed to a chair (including a wheelchair)?: A Little Help needed standing up from a chair using your arms (e.g., wheelchair or bedside chair)?: A Little Help needed to walk in hospital room?: A Little Help needed climbing 3-5 steps with a railing? : A Lot 6 Click Score: 17    End of Session Equipment Utilized During Treatment: Gait belt Activity Tolerance: Patient tolerated treatment well Patient left: in bed;with call bell/phone within reach;with nursing/sitter in room Nurse Communication: Mobility status PT Visit Diagnosis: Other abnormalities of gait and mobility (R26.89);Difficulty in walking, not elsewhere classified (R26.2);Muscle weakness (generalized) (M62.81);Unsteadiness on feet (R26.81);Pain;History of falling (Z91.81) Pain - Right/Left: Right Pain - part of body: Leg     Time: 1530-1553 PT Time Calculation (min) (ACUTE ONLY): 23 min  Charges:  $Gait Training: 8-22 mins $Therapeutic Exercise: 8-22 mins                     Allenmichael Mcpartlin A. Dan Humphreys PT, DPT Acute Rehabilitation Services Pager [phone] Office [phone]    Viviann Spare 12/05/2020, 5:00 PM

## 2020-12-05 NOTE — Progress Notes (Signed)
Inpatient Rehab Admissions Coordinator:   I do not have a bed for this Pt. On CIR today but will follow for potential admit this week pending bed availability.  Megan Salon, MS, CCC-SLP Rehab Admissions Coordinator  [phone] (celll) [phone] (office)

## 2020-12-06 DIAGNOSIS — R5381 Other malaise: Secondary | ICD-10-CM

## 2020-12-06 LAB — GLUCOSE, CAPILLARY
Glucose-Capillary: 120 mg/dL — ABNORMAL HIGH (ref 70–99)
Glucose-Capillary: 131 mg/dL — ABNORMAL HIGH (ref 70–99)
Glucose-Capillary: 153 mg/dL — ABNORMAL HIGH (ref 70–99)
Glucose-Capillary: 115 mg/dL — ABNORMAL HIGH (ref 70–99)

## 2020-12-06 MED ORDER — ACETAMINOPHEN 325 MG PO TABS
650.0000 mg | ORAL_TABLET | Freq: Four times a day (QID) | ORAL | Status: DC | PRN
  Administered 2020-12-06 – 2020-12-07 (×3): 650 mg via ORAL
  Filled 2020-12-06 (×3): qty 2

## 2020-12-06 NOTE — Progress Notes (Signed)
PT Cancellation Note  Patient Details Name: ABDIFATAH Lynn MRN: [id number] DOB: 04/08/1991   Cancelled Treatment:    Reason Eval/Treat Not Completed: Pain limiting ability to participate Patient complaining of 10/10 pain in R LE. Recently received pain medication but with no relief. Requests to defer until tomorrow. PT will re-attempt as time allows.   Eusebio Blazejewski A. Dan Humphreys PT, DPT Acute Rehabilitation Services Pager [phone] Office [phone]    Viviann Spare 12/06/2020, 4:02 PM

## 2020-12-06 NOTE — Progress Notes (Signed)
Refused his insulin. Said he is not a pin cushion and he is not diabetic.  He was only going to receive 1 unit but declined.  Paged Dr Tyson Babinski to notify.

## 2020-12-06 NOTE — Progress Notes (Signed)
IP rehab admissions - I do not have a CIR bed today for this patient.  Will follow up again tomorrow for progress and bed availability.  [phone]

## 2020-12-06 NOTE — Progress Notes (Signed)
PROGRESS NOTE  LUCERO IDE ATF:[id number] DOB: 1991/04/29 DOA: 11/10/2020 PCP: Pcp, No   LOS: 26 days   Brief narrative: Nathaniel Lynn is a 30 y.o. male with a history of narcotic abuse recently released from 2 year prison sentence was found in the reportedly getting high at his girlfriend's house on 5/14, taken to [facility] for evaluation. Labs notable for +UDS (amphetamines, opiates, cannabinoids), Cr 2.33 (baseline 0.8-1.2), CK level > 50K, with lactic acid of 3.0. CT Head was negative for acute pathology.   In the ED, the patient was given Ativan x 2 for tonic-clonic seizure-like activity and was intubated shortly after seizure-like activity was noted. Concern for "posturing". Transferred to [facility] ICU for further evaluation and continuous EEG monitoring. Empiric antibiotics were given and withdrawn after unremarkable. LP performed 5/19 with acyclovir stopped after negative HSV. AKI improved with fluids. Tracheal aspirate grew stenotrophomonas and flavobacterium for which 10-day course of levaquin was started on 11/17/20. EEG demonstrated nonspecific moderate diffuse encephalopathy without epileptiform discharges. MRI brain suggested global hypoxic ischemic injury. Precedex was given for agitation and patient extubated 5/25. The patient was transferred to hospitalist service 10/29/20  still requiring prn sedation for agitation and having post-ATN diuresis and hypernatremia which has improved.  Patient also required a one-to-one sitter.   Assessment/Plan:  Active Problems:   Status epilepticus (HCC)   Acute respiratory failure with hypoxemia (HCC)   Encephalopathy   Pressure injury of skin   AKI (acute kidney injury) (HCC)   Aspiration pneumonia (HCC)   Hypernatremia  Acute toxic, metabolic encephalopathy and concern for now chronic hypoxic encephalopathy:  On seroquel  50mg  qAM, 100mg  qPM.  On chronic oxycodone.  Continue thiamine, folic acid, multivitamin.  Completed clonidine taper.  Has  sitter at bedside.  Appears to be somnolent this morning  Acute hypoxic respiratory failure: Resolved.  On room air.  Completed 10-day course of Levaquin on 11/27/2020   Acute kidney injury secondary to ATN: Resolved.  Latest creatinine of 0.8.  Patient refusing repeat blood work  Hypokalemia.  Replenished.  Latest potassium of 3.6.Patient refusing repeat blood work   Hypernatremia: Resolved.  Latest sodium of 135.Patient refusing repeat blood work   Rhabdomyolysis: Improved.    Demand myocardial ischemia:  Mild troponin elevation, 2D echocardiogram with no regional wall motion abnormalities.  No chest pain or significant EKG changes.   Essential hypertension Closely monitor on clonidine.  Latest blood pressure of 141/99   IVDU, Polysubstance abuse:  TTE without any vegetations.    Lactic acidosis: Resolved.   Moderate protein calorie malnutrition:  Present on admission.  Seen by nutrition service.  Continue nutritional supplementation on boost.  Patient's mom states that he likes the food from home.  Encouraged him to eat more.   LUE superficial thrombosis, RUE/ RLE edema: Ultrasound of the upper extremities without DVT. On prophylactic Lovenox.  Thrombocytosis: Likely reactive in nature.    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  Continue wound care.   DVT prophylaxis: enoxaparin (LOVENOX) injection 40 mg Start: 11/27/20 2200 SCDs Start: 11/10/20 2317   Code Status: Full code  Family Communication:  None today.  Spoke with the patient's mother 12/05/20  Status is: Inpatient  Remains inpatient appropriate because:Unsafe d/c plan, IV treatments appropriate due to intensity of illness or inability to take PO and Inpatient level of care appropriate due to severity of illness, need for rehabilitation placement.  Dispo: The patient is from: Home  Anticipated d/c is to: CIR              Patient currently is not medically stable to d/c.   Difficult to  place patient No   Consultants: PCCM  Procedures: Intubation and mechanical ventilation  Anti-infectives:  None currently  Subjective:  Today, patient was seen and examined at bedside.  Appears sleepy.  Refusing blood work as per the Education administrator.   objective: Vitals:   12/06/20 1146 12/06/20 1211  BP: (!) 141/99   Pulse: (!) 152 99  Resp: 20   Temp: 98.4 F (36.9 C)   SpO2: 98%     Intake/Output Summary (Last 24 hours) at 12/06/2020 1213 Last data filed at 12/05/2020 1400 Gross per 24 hour  Intake 240 ml  Output --  Net 240 ml    Filed Weights   12/04/20 0500 12/05/20 0500 12/06/20 0500  Weight: 76.1 kg 77.7 kg 75.3 kg   Body mass index is 23.82 kg/m.   Physical Exam:  General:  Average built, not in obvious distress, somnolent HENT:   No scleral pallor or icterus noted. Oral mucosa is moist.  Chest:  Clear breath sounds.  Diminished breath sounds bilaterally. No crackles or wheezes.  CVS: S1 &S2 heard. No murmur.  Regular rate and rhythm. Abdomen: Soft, nontender, nondistended.  Bowel sounds are heard.   Extremities: No cyanosis, clubbing or edema.  Peripheral pulses are palpable. Psych: Somnolent,  CNS: Moving all extremities Skin: Warm and dry.  No rashes noted.  Data Review: I have personally reviewed the following laboratory data and studies,  CBC: Recent Labs  Lab 12/01/20 0950  WBC 10.6*  HGB 12.3*  HCT 35.4*  MCV 87.6  PLT 596*    Basic Metabolic Panel: Recent Labs  Lab 12/01/20 0950  NA 135  K 3.6  CL 100  CO2 26  GLUCOSE 114*  BUN 15  CREATININE 0.88  CALCIUM 9.5  MG 2.0  PHOS 3.5    Liver Function Tests: No results for input(s): AST, ALT, ALKPHOS, BILITOT, PROT, ALBUMIN in the last 168 hours.  No results for input(s): LIPASE, AMYLASE in the last 168 hours. No results for input(s): AMMONIA in the last 168 hours. Cardiac Enzymes: No results for input(s): CKTOTAL, CKMB, CKMBINDEX, TROPONINI in the last 168 hours. BNP  (last 3 results) No results for input(s): BNP in the last 8760 hours.  ProBNP (last 3 results) No results for input(s): PROBNP in the last 8760 hours.  CBG: Recent Labs  Lab 12/05/20 1058 12/05/20 1607 12/05/20 2112 12/06/20 0602 12/06/20 1142  GLUCAP 106* 113* 175* 120* 131*    No results found for this or any previous visit (from the past 240 hour(s)).   Studies: No results found.    Joycelyn Das, MD  [facility] 12/06/2020  If 7PM-7AM, please contact night-coverage

## 2020-12-06 NOTE — Plan of Care (Signed)

## 2020-12-06 NOTE — Progress Notes (Addendum)
OT Cancellation Note  Patient Details Name: NIKLAS CHRETIEN MRN: [id number] DOB: 09-19-1990   Cancelled Treatment:    Reason Eval/Treat Not Completed: Other (comment) (Pt reporting increased pain at RLE. Requests to perform therapy later. Will return as schedule allows.)  Second attempt @ 1549: Pt continues to report significant pain. Providing education on need for OOB activity. Pt agreeable to therapy tomorrow.   Golden Emile M Tyus Kallam Jaimy Kliethermes MSOT, OTR/L Acute Rehab Pager: [phone] Office: [phone] 12/06/2020, 5:14 PM

## 2020-12-07 ENCOUNTER — Inpatient Hospital Stay (HOSPITAL_COMMUNITY)
Admission: RE | Admit: 2020-12-07 | Discharge: 2020-12-21 | DRG: 092 | Disposition: A | Discharge status: Home / Self Care | Payer: Self-pay | Source: Intra-hospital | Attending: Physical Medicine & Rehabilitation | Admitting: Physical Medicine & Rehabilitation

## 2020-12-07 ENCOUNTER — Other Ambulatory Visit: Payer: Self-pay

## 2020-12-07 ENCOUNTER — Encounter (HOSPITAL_COMMUNITY): Payer: Self-pay | Admitting: Physical Medicine & Rehabilitation

## 2020-12-07 DIAGNOSIS — Z880 Allergy status to penicillin: Secondary | ICD-10-CM

## 2020-12-07 DIAGNOSIS — S7400XA Injury of sciatic nerve at hip and thigh level, unspecified leg, initial encounter: Secondary | ICD-10-CM

## 2020-12-07 DIAGNOSIS — D62 Acute posthemorrhagic anemia: Secondary | ICD-10-CM

## 2020-12-07 DIAGNOSIS — R109 Unspecified abdominal pain: Secondary | ICD-10-CM

## 2020-12-07 DIAGNOSIS — R4587 Impulsiveness: Secondary | ICD-10-CM | POA: Diagnosis present

## 2020-12-07 DIAGNOSIS — X58XXXD Exposure to other specified factors, subsequent encounter: Secondary | ICD-10-CM | POA: Diagnosis present

## 2020-12-07 DIAGNOSIS — D75839 Thrombocytosis, unspecified: Secondary | ICD-10-CM | POA: Diagnosis present

## 2020-12-07 DIAGNOSIS — M21371 Foot drop, right foot: Secondary | ICD-10-CM | POA: Diagnosis present

## 2020-12-07 DIAGNOSIS — R1012 Left upper quadrant pain: Secondary | ICD-10-CM | POA: Diagnosis present

## 2020-12-07 DIAGNOSIS — G4700 Insomnia, unspecified: Secondary | ICD-10-CM | POA: Diagnosis not present

## 2020-12-07 DIAGNOSIS — S7401XD Injury of sciatic nerve at hip and thigh level, right leg, subsequent encounter: Secondary | ICD-10-CM

## 2020-12-07 DIAGNOSIS — I82612 Acute embolism and thrombosis of superficial veins of left upper extremity: Secondary | ICD-10-CM | POA: Diagnosis present

## 2020-12-07 DIAGNOSIS — K5900 Constipation, unspecified: Secondary | ICD-10-CM

## 2020-12-07 DIAGNOSIS — F1910 Other psychoactive substance abuse, uncomplicated: Secondary | ICD-10-CM

## 2020-12-07 DIAGNOSIS — T796XXS Traumatic ischemia of muscle, sequela: Secondary | ICD-10-CM

## 2020-12-07 DIAGNOSIS — G931 Anoxic brain damage, not elsewhere classified: Principal | ICD-10-CM | POA: Diagnosis present

## 2020-12-07 DIAGNOSIS — R208 Other disturbances of skin sensation: Secondary | ICD-10-CM

## 2020-12-07 DIAGNOSIS — R4189 Other symptoms and signs involving cognitive functions and awareness: Secondary | ICD-10-CM | POA: Diagnosis present

## 2020-12-07 DIAGNOSIS — E876 Hypokalemia: Secondary | ICD-10-CM | POA: Diagnosis present

## 2020-12-07 DIAGNOSIS — G5701 Lesion of sciatic nerve, right lower limb: Secondary | ICD-10-CM

## 2020-12-07 DIAGNOSIS — R269 Unspecified abnormalities of gait and mobility: Secondary | ICD-10-CM | POA: Diagnosis present

## 2020-12-07 LAB — CBC
HCT: 34 % — ABNORMAL LOW (ref 39.0–52.0)
Hemoglobin: 11.8 g/dL — ABNORMAL LOW (ref 13.0–17.0)
MCH: 30.4 pg (ref 26.0–34.0)
MCHC: 34.7 g/dL (ref 30.0–36.0)
MCV: 87.6 fL (ref 80.0–100.0)
Platelets: 438 10*3/uL — ABNORMAL HIGH (ref 150–400)
RBC: 3.88 MIL/uL — ABNORMAL LOW (ref 4.22–5.81)
RDW: 12.2 % (ref 11.5–15.5)
WBC: 6.6 10*3/uL (ref 4.0–10.5)
nRBC: 0 % (ref 0.0–0.2)

## 2020-12-07 LAB — BASIC METABOLIC PANEL
Anion gap: 11 (ref 5–15)
BUN: 10 mg/dL (ref 6–20)
CO2: 27 mmol/L (ref 22–32)
Calcium: 9.4 mg/dL (ref 8.9–10.3)
Chloride: 97 mmol/L — ABNORMAL LOW (ref 98–111)
Creatinine, Ser: 0.87 mg/dL (ref 0.61–1.24)
GFR, Estimated: >60 mL/min (ref >60)
Glucose, Bld: 106 mg/dL — ABNORMAL HIGH (ref 70–99)
Potassium: 3.2 mmol/L — ABNORMAL LOW (ref 3.5–5.1)
Sodium: 135 mmol/L (ref 135–145)

## 2020-12-07 LAB — MAGNESIUM: Magnesium: 1.9 mg/dL (ref 1.7–2.4)

## 2020-12-07 LAB — GLUCOSE, CAPILLARY
Glucose-Capillary: 118 mg/dL — ABNORMAL HIGH (ref 70–99)
Glucose-Capillary: 152 mg/dL — ABNORMAL HIGH (ref 70–99)
Glucose-Capillary: 111 mg/dL — ABNORMAL HIGH (ref 70–99)

## 2020-12-07 MED ORDER — METHOCARBAMOL 500 MG PO TABS
500.0000 mg | ORAL_TABLET | Freq: Four times a day (QID) | ORAL | Status: DC | PRN
  Administered 2020-12-07 – 2020-12-21 (×14): 500 mg via ORAL
  Filled 2020-12-07 (×15): qty 1

## 2020-12-07 MED ORDER — PROCHLORPERAZINE 25 MG RE SUPP: 12.5000 mg | Freq: Four times a day (QID) | RECTAL | Status: DC | PRN

## 2020-12-07 MED ORDER — ADULT MULTIVITAMIN W/MINERALS CH: 1.0000 | ORAL_TABLET | Freq: Every day | ORAL | Status: DC

## 2020-12-07 MED ORDER — ORAL CARE MOUTH RINSE
15.0000 mL | Freq: Two times a day (BID) | OROMUCOSAL | Status: DC
  Administered 2020-12-09 – 2020-12-20 (×7): 15 mL via OROMUCOSAL

## 2020-12-07 MED ORDER — PREGABALIN 25 MG PO CAPS: 25.0000 mg | ORAL_CAPSULE | Freq: Two times a day (BID) | ORAL | Status: DC

## 2020-12-07 MED ORDER — PREGABALIN 25 MG PO CAPS
25.0000 mg | ORAL_CAPSULE | Freq: Three times a day (TID) | ORAL | Status: DC
  Administered 2020-12-07 – 2020-12-10 (×9): 25 mg via ORAL
  Filled 2020-12-07 (×9): qty 1

## 2020-12-07 MED ORDER — MUPIROCIN CALCIUM 2 % EX CREA
TOPICAL_CREAM | Freq: Every day | CUTANEOUS | Status: DC
  Administered 2020-12-08 – 2020-12-09 (×2): 1 via TOPICAL
  Filled 2020-12-07: qty 15

## 2020-12-07 MED ORDER — ENOXAPARIN SODIUM 40 MG/0.4ML IJ SOSY
40.0000 mg | PREFILLED_SYRINGE | INTRAMUSCULAR | Status: DC
  Administered 2020-12-11: 40 mg via SUBCUTANEOUS
  Filled 2020-12-07 (×3): qty 0.4

## 2020-12-07 MED ORDER — FOLIC ACID 1 MG PO TABS
1.0000 mg | ORAL_TABLET | Freq: Every day | ORAL | Status: DC
  Administered 2020-12-08 – 2020-12-13 (×6): 1 mg via ORAL
  Filled 2020-12-07 (×6): qty 1

## 2020-12-07 MED ORDER — ORAL CARE MOUTH RINSE: 15.0000 mL | Freq: Two times a day (BID) | OROMUCOSAL | 0 refills | Status: AC

## 2020-12-07 MED ORDER — OXYCODONE HCL 5 MG PO TABS: 5.0000 mg | ORAL_TABLET | Freq: Two times a day (BID) | ORAL | Status: DC

## 2020-12-07 MED ORDER — QUETIAPINE FUMARATE 50 MG PO TABS
100.0000 mg | ORAL_TABLET | Freq: Every day | ORAL | Status: DC
  Administered 2020-12-07 – 2020-12-20 (×14): 100 mg via ORAL
  Filled 2020-12-07 (×15): qty 2

## 2020-12-07 MED ORDER — POLYETHYLENE GLYCOL 3350 17 G PO PACK
17.0000 g | PACK | Freq: Every day | ORAL | Status: DC | PRN
  Administered 2020-12-14 – 2020-12-20 (×2): 17 g via ORAL
  Filled 2020-12-07: qty 1

## 2020-12-07 MED ORDER — QUETIAPINE FUMARATE 50 MG PO TABS: 50.0000 mg | ORAL_TABLET | Freq: Every morning | ORAL | Status: DC

## 2020-12-07 MED ORDER — PROCHLORPERAZINE EDISYLATE 10 MG/2ML IJ SOLN
5.0000 mg | Freq: Four times a day (QID) | INTRAMUSCULAR | Status: DC | PRN
Start: 2020-12-07 — End: 2020-12-21

## 2020-12-07 MED ORDER — ACETAMINOPHEN 325 MG PO TABS
325.0000 mg | ORAL_TABLET | ORAL | Status: DC | PRN
  Administered 2020-12-07 – 2020-12-21 (×18): 650 mg via ORAL
  Filled 2020-12-07 (×20): qty 2

## 2020-12-07 MED ORDER — PROSOURCE PLUS PO LIQD: 30.0000 mL | Freq: Two times a day (BID) | ORAL | Status: AC

## 2020-12-07 MED ORDER — FLEET ENEMA 7-19 GM/118ML RE ENEM: 1.0000 | ENEMA | Freq: Once | RECTAL | Status: DC | PRN

## 2020-12-07 MED ORDER — THIAMINE HCL 100 MG PO TABS: 100.0000 mg | ORAL_TABLET | Freq: Every day | ORAL | Status: DC

## 2020-12-07 MED ORDER — QUETIAPINE FUMARATE 50 MG PO TABS
50.0000 mg | ORAL_TABLET | Freq: Every morning | ORAL | Status: DC
  Administered 2020-12-08 – 2020-12-21 (×13): 50 mg via ORAL
  Filled 2020-12-07 (×2): qty 1
  Filled 2020-12-07 (×2): qty 2
  Filled 2020-12-07 (×4): qty 1
  Filled 2020-12-07: qty 2
  Filled 2020-12-07 (×2): qty 1
  Filled 2020-12-07: qty 2
  Filled 2020-12-07: qty 1

## 2020-12-07 MED ORDER — FOLIC ACID 1 MG PO TABS: 1.0000 mg | ORAL_TABLET | Freq: Every day | ORAL | Status: DC

## 2020-12-07 MED ORDER — GUAIFENESIN-DM 100-10 MG/5ML PO SYRP: 5.0000 mL | ORAL_SOLUTION | Freq: Four times a day (QID) | ORAL | Status: DC | PRN

## 2020-12-07 MED ORDER — PREGABALIN 25 MG PO CAPS
25.0000 mg | ORAL_CAPSULE | Freq: Two times a day (BID) | ORAL | Status: DC
  Filled 2020-12-07: qty 1

## 2020-12-07 MED ORDER — OXYCODONE HCL 5 MG PO TABS
5.0000 mg | ORAL_TABLET | Freq: Two times a day (BID) | ORAL | Status: DC
  Administered 2020-12-07 – 2020-12-21 (×28): 5 mg via ORAL
  Filled 2020-12-07 (×29): qty 1

## 2020-12-07 MED ORDER — THIAMINE HCL 100 MG PO TABS
100.0000 mg | ORAL_TABLET | Freq: Every day | ORAL | Status: DC
  Administered 2020-12-08 – 2020-12-13 (×6): 100 mg via ORAL
  Filled 2020-12-07 (×6): qty 1

## 2020-12-07 MED ORDER — DOCUSATE SODIUM 100 MG PO CAPS: 100.0000 mg | ORAL_CAPSULE | Freq: Two times a day (BID) | ORAL | 0 refills | Status: DC

## 2020-12-07 MED ORDER — PROCHLORPERAZINE MALEATE 5 MG PO TABS: 5.0000 mg | ORAL_TABLET | Freq: Four times a day (QID) | ORAL | Status: DC | PRN

## 2020-12-07 MED ORDER — IPRATROPIUM-ALBUTEROL 0.5-2.5 (3) MG/3ML IN SOLN: 3.0000 mL | Freq: Four times a day (QID) | RESPIRATORY_TRACT | Status: DC | PRN

## 2020-12-07 MED ORDER — POLYETHYLENE GLYCOL 3350 17 G PO PACK
17.0000 g | PACK | Freq: Every day | ORAL | Status: DC
  Administered 2020-12-08 – 2020-12-15 (×6): 17 g via ORAL
  Filled 2020-12-07 (×13): qty 1

## 2020-12-07 MED ORDER — ADULT MULTIVITAMIN W/MINERALS CH
1.0000 | ORAL_TABLET | Freq: Every day | ORAL | Status: DC
  Administered 2020-12-08 – 2020-12-21 (×14): 1 via ORAL
  Filled 2020-12-07 (×15): qty 1

## 2020-12-07 MED ORDER — BOOST / RESOURCE BREEZE PO LIQD CUSTOM
1.0000 | Freq: Three times a day (TID) | ORAL | Status: DC
  Administered 2020-12-10 – 2020-12-18 (×9): 1 via ORAL

## 2020-12-07 MED ORDER — POLYETHYLENE GLYCOL 3350 17 G PO PACK: 17.0000 g | PACK | Freq: Every day | ORAL | 0 refills | Status: DC

## 2020-12-07 MED ORDER — BISACODYL 10 MG RE SUPP: 10.0000 mg | Freq: Every day | RECTAL | Status: DC | PRN

## 2020-12-07 MED ORDER — ALUM & MAG HYDROXIDE-SIMETH 200-200-20 MG/5ML PO SUSP: 30.0000 mL | ORAL | Status: DC | PRN

## 2020-12-07 MED ORDER — DIPHENHYDRAMINE HCL 12.5 MG/5ML PO ELIX: 12.5000 mg | ORAL_SOLUTION | Freq: Four times a day (QID) | ORAL | Status: DC | PRN

## 2020-12-07 MED ORDER — POLYVINYL ALCOHOL 1.4 % OP SOLN: 1.0000 [drp] | Freq: Three times a day (TID) | OPHTHALMIC | 0 refills | Status: DC

## 2020-12-07 MED ORDER — QUETIAPINE FUMARATE 100 MG PO TABS: 100.0000 mg | ORAL_TABLET | Freq: Every day | ORAL | Status: DC

## 2020-12-07 MED ORDER — POTASSIUM CHLORIDE CRYS ER 20 MEQ PO TBCR: 40.0000 meq | EXTENDED_RELEASE_TABLET | Freq: Once | ORAL | Status: DC

## 2020-12-07 MED ORDER — MUPIROCIN CALCIUM 2 % EX CREA: TOPICAL_CREAM | Freq: Every day | CUTANEOUS | 0 refills | Status: AC

## 2020-12-07 MED ORDER — POLYVINYL ALCOHOL 1.4 % OP SOLN
1.0000 [drp] | Freq: Three times a day (TID) | OPHTHALMIC | Status: DC
  Administered 2020-12-11 – 2020-12-20 (×6): 1 [drp] via OPHTHALMIC
  Filled 2020-12-07: qty 15

## 2020-12-07 MED ORDER — TRAZODONE HCL 50 MG PO TABS
25.0000 mg | ORAL_TABLET | Freq: Every evening | ORAL | Status: DC | PRN
  Administered 2020-12-07 – 2020-12-09 (×2): 50 mg via ORAL
  Filled 2020-12-07 (×2): qty 1

## 2020-12-07 MED ORDER — ACETAMINOPHEN 325 MG PO TABS: 650.0000 mg | ORAL_TABLET | Freq: Four times a day (QID) | ORAL | Status: DC | PRN

## 2020-12-07 NOTE — TOC Transition Note (Signed)
Transition of Care [facility]) - CM/SW Discharge Note   Patient Details  Name: JARIEL DROST MRN: [id number] Date of Birth: 1990/12/21  [facility] CM/SW Contact:  Kermit Balo, RN Phone Number: 12/07/2020, 10:15 AM   Clinical Narrative:    Patient is discharging to CIR today. CM signing off.   Final next level of care: IP Rehab Facility Barriers to Discharge: Inadequate or no insurance, Barriers Unresolved (comment)   Patient Goals and CMS Choice        Discharge Placement                       Discharge Plan and Services                                     Social Determinants of Health (SDOH) Interventions     Readmission Risk Interventions No flowsheet data found.

## 2020-12-07 NOTE — Progress Notes (Signed)
PMR Admission Coordinator Pre-Admission Assessment   Patient: Nathaniel Lynn is an 30 y.o., male MRN: [id number] DOB: 1991/02/12 Height:   Weight: 75.3 kg                                                                                                                                                  Insurance Information HMO:    PPO:      PCP:      IPA:      80/20:      OTHER: PRIMARY: Uninsure SECONDARY:       Policy#:       Phone#:   Artist:       Phone   The Engineer, materials Information Summary" for patients in Inpatient Rehabilitation Facilities with attached "Privacy Act Statement-Health Care Records" was provided and verbally reviewed with: N/A   Emergency Contact Information Contact Information       Name Relation Home Work Mobile    Curlee, Bogan Mother [phone]        Andrw, Mcguirt Father     [phone]    Printice, Hellmer Sister [phone]        Adyn Serna, Tomma Lightning Brother     [phone]         Current Medical History  Patient Admitting Diagnosis:Anoxic BI History of Present Illness: Nathaniel Lynn is a 30 y.o. male with a history of narcotic abuse recently release from 2 year prison sentence who was found down reportedly "getting high" at girlfriend's house on 5/14, taken to [facility] for evaluation. Labs notable for +UDS (amphetamines, opiates, cannabinoids), Cr 2.33 (baseline 0.8-1.2), CK level > 50K, LA 3.0. CT Head was negative for acute pathology. While in the ED, the patient was given Ativan x 2 for tonic-clonic seizure-like activity and was intubated shortly after seizure-like activity was noted. Concern for "posturing". Transferred to [facility] ICU for further evaluation and continuous EEG monitoring. Empiric antibiotics were given and withdrawn after unremarkable LP performed 5/19 with acyclovir stopped after negative HSV. AKI improved with fluids. Tracheal aspirate grew stenotrophomonas and flavobacterium for which 10-day course of levaquin  was started on 5/21. No purposeful movements despite weaning sedation initially, though he eventually began following commands intermittently. EEG demonstrated nonspecific moderate diffuse encephalopathy without epileptiform discharges. MRI brain suggested global hypoxic ischemic injury. Precedex was given for agitation and patient extubated 5/25. The patient was transferred to hospitalist service 5/28 still requiring prn sedation for agitation and having post-ATN diuresis and hypernatremia which has improved   Glasgow Coma Scale Score: (!) 17   Past Medical History         Past Medical History:  Diagnosis Date   IVDU (intravenous drug user)        Family History  family history is not on file.   Prior Rehab/Hospitalizations:  Has the patient had prior rehab  or hospitalizations prior to admission? Yes   Has the patient had major surgery during 100 days prior to admission? No   Current Medications   Current Facility-Administered Medications:   (feeding supplement) PROSource Plus liquid 30 mL, 30 mL, Oral, BID BM, Hazeline Junker B, MD, 30 mL at 12/06/20 1033   0.9 %  sodium chloride infusion, , Intravenous, PRN, Cheri Fowler, MD, Last Rate: 10 mL/hr at 11/27/20 0224, Restarted at 11/27/20 0224   acetaminophen (TYLENOL) tablet 650 mg, 650 mg, Oral, Q6H PRN, Pokhrel, Laxman, MD, 650 mg at 12/07/20 0408   docusate sodium (COLACE) capsule 100 mg, 100 mg, Oral, BID, Gaynell Face, Jessica, DO, 100 mg at 12/06/20 2210   enoxaparin (LOVENOX) injection 40 mg, 40 mg, Subcutaneous, Q24H, Hazeline Junker B, MD, 40 mg at 12/06/20 1033   feeding supplement (BOOST / RESOURCE BREEZE) liquid 1 Container, 1 Container, Oral, TID BM, Tyrone Nine, MD, 1 Container at 12/06/20 2038   folic acid (FOLVITE) tablet 1 mg, 1 mg, Oral, Daily, Briant Sites, DO, 1 mg at 12/06/20 1033   haloperidol lactate (HALDOL) injection 2 mg, 2 mg, Intravenous, Q6H PRN, Pokhrel, Laxman, MD, 2 mg at 12/02/20 0831   hydrALAZINE  (APRESOLINE) injection 10-20 mg, 10-20 mg, Intravenous, Q4H PRN, Ogan, Okoronkwo U, MD, 10 mg at 11/23/20 0138   insulin aspart (novoLOG) injection 0-6 Units, 0-6 Units, Subcutaneous, TID AC & HS, Shalhoub, Deno Lunger, MD   ipratropium-albuterol (DUONEB) 0.5-2.5 (3) MG/3ML nebulizer solution 3 mL, 3 mL, Nebulization, Q6H PRN, Annett Fabian, MD, 3 mL at 11/21/20 1353   labetalol (NORMODYNE) injection 10 mg, 10 mg, Intravenous, Q2H PRN, Pecola Leisure, Stephanie M, PA-C, 10 mg at 11/23/20 0423   MEDLINE mouth rinse, 15 mL, Mouth Rinse, BID, Donalee Citrin, MD, 15 mL at 12/03/20 1104   multivitamin with minerals tablet 1 tablet, 1 tablet, Oral, Daily, Briant Sites, DO, 1 tablet at 12/06/20 1033   mupirocin cream (BACTROBAN) 2 %, , Topical, Daily, Tyrone Nine, MD, Given at 12/06/20 1034   oxyCODONE (Oxy IR/ROXICODONE) immediate release tablet 5 mg, 5 mg, Oral, BID, Hazeline Junker B, MD, 5 mg at 12/06/20 2211   polyethylene glycol (MIRALAX / GLYCOLAX) packet 17 g, 17 g, Oral, Daily, Gaynell Face, Jessica, DO, 17 g at 12/06/20 1033   polyvinyl alcohol (LIQUIFILM TEARS) 1.4 % ophthalmic solution 1 drop, 1 drop, Both Eyes, TID, Annett Fabian, MD, 1 drop at 12/06/20 1039   QUEtiapine (SEROQUEL) tablet 100 mg, 100 mg, Oral, QHS, Hazeline Junker B, MD, 100 mg at 12/06/20 2211   QUEtiapine (SEROQUEL) tablet 50 mg, 50 mg, Oral, q AM, Hazeline Junker B, MD, 50 mg at 12/07/20 0738   sodium chloride flush (NS) 0.9 % injection 10-40 mL, 10-40 mL, Intracatheter, Q12H, Chand, Sudham, MD, 10 mL at 12/05/20 1051   sodium chloride flush (NS) 0.9 % injection 10-40 mL, 10-40 mL, Intracatheter, PRN, Cheri Fowler, MD, 10 mL at 12/03/20 1103   thiamine tablet 100 mg, 100 mg, Oral, Daily, Briant Sites, DO, 100 mg at 12/06/20 1033   Patients Current Diet:  Diet Order                  DIET SOFT Room service appropriate? Yes; Fluid consistency: Thin  Diet effective now                         Precautions /  Restrictions Precautions Precautions: Fall Precaution Comments: R-side weakness and lower leg pain  Restrictions Weight Bearing Restrictions: No   Has the patient had 2 or more falls or a fall with injury in the past year?No   Prior Activity Level Community (5-7x/wk): Pt. went out dialy   Prior Functional Level Prior Function Level of Independence: Independent   Self Care: Did the patient need help bathing, dressing, using the toilet or eating?  Independent   Indoor Mobility: Did the patient need assistance with walking from room to room (with or without device)? Independent   Stairs: Did the patient need assistance with internal or external stairs (with or without device)? Independent   Functional Cognition: Did the patient need help planning regular tasks such as shopping or remembering to take medications? Independent   Home Assistive Devices / Equipment Home Equipment: None   Prior Device Use: Indicate devices/aids used by the patient prior to current illness, exacerbation or injury? None of the above   Current Functional Level Cognition   Arousal/Alertness: Awake/alert Overall Cognitive Status: Impaired/Different from baseline Current Attention Level: Selective Orientation Level: Oriented to person, Oriented to place, Disoriented to time, Disoriented to situation Following Commands: Follows one step commands consistently, Follows multi-step commands with increased time, Follows one step commands with increased time, Follows multi-step commands inconsistently Safety/Judgement: Decreased awareness of safety, Decreased awareness of deficits General Comments: Poor awareness of deficits and safety requiring cues and assist for safety.Difficulty problem solving navigating around obstacles with RW Attention: Sustained Sustained Attention: Impaired Sustained Attention Impairment: Functional basic, Functional complex Memory: Impaired Memory Impairment: Storage deficit, Retrieval  deficit, Decreased recall of new information Awareness: Impaired Awareness Impairment: Intellectual impairment Problem Solving: Impaired Problem Solving Impairment: Verbal basic, Functional basic Executive Function: Reasoning, Decision Making, Self Monitoring Reasoning: Impaired Reasoning Impairment: Verbal basic, Functional basic Decision Making: Impaired Decision Making Impairment: Verbal basic, Functional basic Self Monitoring: Impaired Self Monitoring Impairment: Verbal basic, Functional basic Safety/Judgment: Impaired Comments: poor safety awareness    Extremity Assessment (includes Sensation/Coordination)   Upper Extremity Assessment: RUE deficits/detail, LUE deficits/detail RUE Deficits / Details: shoulder flexion 100 degrees, undershooting with objects, able to grasp with pincher grasp RUE Coordination: decreased fine motor LUE Deficits / Details: grasp 4/5, able to raise UE or keep elevated when placed LUE Coordination: decreased gross motor, decreased fine motor Lower Extremity Assessment: Defer to PT evaluation RLE Deficits / Details: pt with grossly 2-/5 strength with significant edema RLE. Pt with consistent buckling in standing    ADLs   Overall ADL's : Needs assistance/impaired Eating/Feeding: Minimal assistance, Sitting Eating/Feeding Details (indicate cue type and reason): Min hand over hand to maintain grasp on cup while R hand pull tab to open sweet tea. Grooming: Min guard, Cueing for safety, Standing Grooming Details (indicate cue type and reason): Min Guard A for safety in standing due to decreased balance and awareness. Pt maintaining standing at sink to compelte oral care. Lower Body Dressing: Minimal assistance, Sit to/from stand Lower Body Dressing Details (indicate cue type and reason): Min A for initating donning sock on R foot. Toilet Transfer: Min guard, Ambulation, RW (simulated to at [employer]) Statistician Details (indicate cue type and reason): Min  guard A for safety Toileting - Clothing Manipulation Details (indicate cue type and reason): incontinent bowel and bladder in bed, total assist Functional mobility during ADLs: Minimal assistance, Rolling walker General ADL Comments: Pt requiring increased engaouragement this session due pain and pt with decreased future thinking and executive functioning. Pt agreeable to oral care at sink    Mobility   Overal bed  mobility: Needs Assistance Bed Mobility: Supine to Sit, Sit to Supine Rolling: Supervision Supine to sit: Supervision Sit to supine: Supervision General bed mobility comments: supervision for safety    Transfers   Overall transfer level: Needs assistance Equipment used: Rolling walker (2 wheeled) Transfers: Sit to/from Stand Sit to Stand: Min assist Squat pivot transfers: Max assist, +2 physical assistance  Lateral/Scoot Transfers: Max assist, +2 physical assistance, +2 safety/equipment General transfer comment: minA to rise and steady into standing. Cues for hand placement with RW. Sit to stand x 5 during session    Ambulation / Gait / Stairs / Wheelchair Mobility   Ambulation/Gait Ambulation/Gait assistance: Editor, commissioningMin assist Gait Distance (Feet): 120 Feet Assistive device: Rolling walker (2 wheeled) Gait Pattern/deviations: Narrow base of support, Step-to pattern, Step-through pattern, Decreased stride length, Decreased step length - right, Decreased weight shift to right, Decreased dorsiflexion - right General Gait Details: Cues for heel strike on R with poor follow through as patient fixated on R lower leg pain with ambulation and unwililng to attempt. MinA for balance and RW management at times. Difficulty problem solving navigation around obstacles in a uncontrolled environment Gait velocity: decreased Gait velocity interpretation: <1.31 ft/sec, indicative of household ambulator    Posture / Balance Dynamic Sitting Balance Sitting balance - Comments: requires supervision  for safety Balance Overall balance assessment: Needs assistance Sitting-balance support: Feet supported, No upper extremity supported Sitting balance-Leahy Scale: Good Sitting balance - Comments: requires supervision for safety Postural control: Left lateral lean Standing balance support: Bilateral upper extremity supported, During functional activity Standing balance-Leahy Scale: Poor Standing balance comment: reliant on external assist    Special needs/care consideration Abrasion to the L ear, Contact dermatitis to the right anterior thigh, ecchymosis to the R kneee        Previous Home Environment (from acute therapy documentation) Living Arrangements: Parent  Lives With: Family Available Help at Discharge: Family, Available 24 hours/day Type of Home: Mobile home Home Layout: One level Home Access: Stairs to enter Entergy CorporationEntrance Stairs-Number of Steps: 2 Bathroom Shower/Tub: Psychologist, counsellingWalk-in shower, Nurse, adultTub/shower unit Bathroom Toilet: Handicapped height Additional Comments: sister Baird LyonsCasey provided home information.   Discharge Living Setting Plans for Discharge Living Setting: House Type of Home at Discharge: Mobile home Discharge Home Layout: One level Discharge Home Access: Stairs to enter Entrance Stairs-Rails: None Entrance Stairs-Number of Steps: 2 Discharge Bathroom Shower/Tub: Walk-in shower, Tub/shower unit Discharge Bathroom Toilet: Handicapped height Discharge Bathroom Accessibility: Yes How Accessible: Accessible via walker Does the patient have any problems obtaining your medications?: No   Social/Family/Support Systems Patient Roles: Parent Contact Information: [phone] Anticipated Caregiver: Nyoka CowdenCarroll Burkes (mother) Anticipated Caregiver's Contact Information: [phone] Ability/Limitations of Caregiver: Can provide Min/mod A, familhy to rotate to provide care Caregiver Availability: 24/7 Discharge Plan Discussed with Primary Caregiver: Yes Is Caregiver In  Agreement with Plan?: Yes Does Caregiver/Family have Issues with Lodging/Transportation while Pt is in Rehab?: Yes     Goals Patient/Family Goal for Rehab: PT/OT/SLP Mod A Expected length of stay: 28-30 days Pt/Family Agrees to Admission and willing to participate: Yes Program Orientation Provided & Reviewed with Pt/Caregiver Including Roles  & Responsibilities: Yes     Decrease burden of Care through IP rehab admission: Specialzed equipment needs, Decrease number of caregivers, Bowel and bladder program and Patient/family education     Possible need for SNF placement upon discharge:not anticipated     Patient Condition: This patient's medical and functional status has changed since the consult dated: 11/26/2020 in which the Rehabilitation Physician determined  and documented that the patient's condition is appropriate for intensive rehabilitative care in an inpatient rehabilitation facility. See "History of Present Illness" (above) for medical update. Functional changes are: Pt. Transferring and ambulating at Min A level. Patient's medical and functional status update has been discussed with the Rehabilitation physician and patient remains appropriate for inpatient rehabilitation. Will admit to inpatient rehab today.   Preadmission Screen Completed By:  Jeronimo Greaves, CCC-SLP, 12/07/2020 10:26 AM ______________________________________________________________________   Discussed status with Dr. Riley Kill on 12/07/2020 at 945 and received approval for admission today.   Admission Coordinator:  Jeronimo Greaves, time 1000 Dorna Bloom 12/07/2020           Cosigned by: Ranelle Oyster, MD at 12/07/2020 10:38 AM

## 2020-12-07 NOTE — PMR Pre-admission (Signed)
PMR Admission Coordinator Pre-Admission Assessment   Patient: Nathaniel Lynn is an 30 y.o., male MRN: [id number] DOB: 01/13/1991 Height:   Weight: 75.3 kg                                                                                                                                                  Insurance Information HMO:    PPO:      PCP:      IPA:      80/20:      OTHER: PRIMARY: Uninsure SECONDARY:       Policy#:       Phone#:   Artist:       Phone   The Engineer, materials Information Summary" for patients in Inpatient Rehabilitation Facilities with attached "Privacy Act Statement-Health Care Records" was provided and verbally reviewed with: N/A   Emergency Contact Information Contact Information       Name Relation Home Work Mobile    Kamryn, Gauthier Mother [phone]        Asar, Evilsizer Father     [phone]    Jamen, Loiseau Sister [phone]        Brelan Hannen, Tomma Lightning Brother     [phone]         Current Medical History  Patient Admitting Diagnosis:Anoxic BI History of Present Illness: Nathaniel Lynn is a 30 y.o. male with a history of narcotic abuse recently release from 2 year prison sentence who was found down reportedly "getting high" at girlfriend's house on 5/14, taken to [facility] for evaluation. Labs notable for +UDS (amphetamines, opiates, cannabinoids), Cr 2.33 (baseline 0.8-1.2), CK level > 50K, LA 3.0. CT Head was negative for acute pathology. While in the ED, the patient was given Ativan x 2 for tonic-clonic seizure-like activity and was intubated shortly after seizure-like activity was noted. Concern for "posturing". Transferred to [facility] ICU for further evaluation and continuous EEG monitoring. Empiric antibiotics were given and withdrawn after unremarkable LP performed 5/19 with acyclovir stopped after negative HSV. AKI improved with fluids. Tracheal aspirate grew stenotrophomonas and flavobacterium for which 10-day course of levaquin  was started on 5/21. No purposeful movements despite weaning sedation initially, though he eventually began following commands intermittently. EEG demonstrated nonspecific moderate diffuse encephalopathy without epileptiform discharges. MRI brain suggested global hypoxic ischemic injury. Precedex was given for agitation and patient extubated 5/25. The patient was transferred to hospitalist service 5/28 still requiring prn sedation for agitation and having post-ATN diuresis and hypernatremia which has improved   Glasgow Coma Scale Score: (!) 17   Past Medical History      Past Medical History:  Diagnosis Date   IVDU (intravenous drug user)        Family History  family history is not on file.   Prior Rehab/Hospitalizations:  Has the patient had prior rehab or hospitalizations prior  to admission? Yes   Has the patient had major surgery during 100 days prior to admission? No   Current Medications   Current Facility-Administered Medications:   (feeding supplement) PROSource Plus liquid 30 mL, 30 mL, Oral, BID BM, Hazeline JunkerGrunz, Ryan B, MD, 30 mL at 12/06/20 1033   0.9 %  sodium chloride infusion, , Intravenous, PRN, Cheri Fowlerhand, Sudham, MD, Last Rate: 10 mL/hr at 11/27/20 0224, Restarted at 11/27/20 0224   acetaminophen (TYLENOL) tablet 650 mg, 650 mg, Oral, Q6H PRN, Pokhrel, Laxman, MD, 650 mg at 12/07/20 0408   docusate sodium (COLACE) capsule 100 mg, 100 mg, Oral, BID, Gaynell FaceMarshall, Jessica, DO, 100 mg at 12/06/20 2210   enoxaparin (LOVENOX) injection 40 mg, 40 mg, Subcutaneous, Q24H, Hazeline JunkerGrunz, Ryan B, MD, 40 mg at 12/06/20 1033   feeding supplement (BOOST / RESOURCE BREEZE) liquid 1 Container, 1 Container, Oral, TID BM, Tyrone NineGrunz, Ryan B, MD, 1 Container at 12/06/20 2038   folic acid (FOLVITE) tablet 1 mg, 1 mg, Oral, Daily, Briant SitesMarshall, Jessica, DO, 1 mg at 12/06/20 1033   haloperidol lactate (HALDOL) injection 2 mg, 2 mg, Intravenous, Q6H PRN, Pokhrel, Laxman, MD, 2 mg at 12/02/20 0831   hydrALAZINE (APRESOLINE)  injection 10-20 mg, 10-20 mg, Intravenous, Q4H PRN, Ogan, Okoronkwo U, MD, 10 mg at 11/23/20 0138   insulin aspart (novoLOG) injection 0-6 Units, 0-6 Units, Subcutaneous, TID AC & HS, Shalhoub, Deno LungerGeorge J, MD   ipratropium-albuterol (DUONEB) 0.5-2.5 (3) MG/3ML nebulizer solution 3 mL, 3 mL, Nebulization, Q6H PRN, Annett Fabianichards, Karol A, MD, 3 mL at 11/21/20 1353   labetalol (NORMODYNE) injection 10 mg, 10 mg, Intravenous, Q2H PRN, Pecola Leisureeese, Stephanie M, PA-C, 10 mg at 11/23/20 0423   MEDLINE mouth rinse, 15 mL, Mouth Rinse, BID, Donalee Citrinram, Gary, MD, 15 mL at 12/03/20 1104   multivitamin with minerals tablet 1 tablet, 1 tablet, Oral, Daily, Briant SitesMarshall, Jessica, DO, 1 tablet at 12/06/20 1033   mupirocin cream (BACTROBAN) 2 %, , Topical, Daily, Tyrone NineGrunz, Ryan B, MD, Given at 12/06/20 1034   oxyCODONE (Oxy IR/ROXICODONE) immediate release tablet 5 mg, 5 mg, Oral, BID, Hazeline JunkerGrunz, Ryan B, MD, 5 mg at 12/06/20 2211   polyethylene glycol (MIRALAX / GLYCOLAX) packet 17 g, 17 g, Oral, Daily, Gaynell FaceMarshall, Jessica, DO, 17 g at 12/06/20 1033   polyvinyl alcohol (LIQUIFILM TEARS) 1.4 % ophthalmic solution 1 drop, 1 drop, Both Eyes, TID, Annett Fabianichards, Karol A, MD, 1 drop at 12/06/20 1039   QUEtiapine (SEROQUEL) tablet 100 mg, 100 mg, Oral, QHS, Hazeline JunkerGrunz, Ryan B, MD, 100 mg at 12/06/20 2211   QUEtiapine (SEROQUEL) tablet 50 mg, 50 mg, Oral, q AM, Hazeline JunkerGrunz, Ryan B, MD, 50 mg at 12/07/20 0738   sodium chloride flush (NS) 0.9 % injection 10-40 mL, 10-40 mL, Intracatheter, Q12H, Chand, Sudham, MD, 10 mL at 12/05/20 1051   sodium chloride flush (NS) 0.9 % injection 10-40 mL, 10-40 mL, Intracatheter, PRN, Cheri Fowlerhand, Sudham, MD, 10 mL at 12/03/20 1103   thiamine tablet 100 mg, 100 mg, Oral, Daily, Briant SitesMarshall, Jessica, DO, 100 mg at 12/06/20 1033   Patients Current Diet:  Diet Order                  DIET SOFT Room service appropriate? Yes; Fluid consistency: Thin  Diet effective now                         Precautions /  Restrictions Precautions Precautions: Fall Precaution Comments: R-side weakness and lower leg pain Restrictions Weight Bearing  Restrictions: No   Has the patient had 2 or more falls or a fall with injury in the past year?No   Prior Activity Level Community (5-7x/wk): Pt. went out dialy   Prior Functional Level Prior Function Level of Independence: Independent   Self Care: Did the patient need help bathing, dressing, using the toilet or eating?  Independent   Indoor Mobility: Did the patient need assistance with walking from room to room (with or without device)? Independent   Stairs: Did the patient need assistance with internal or external stairs (with or without device)? Independent   Functional Cognition: Did the patient need help planning regular tasks such as shopping or remembering to take medications? Independent   Home Assistive Devices / Equipment Home Equipment: None   Prior Device Use: Indicate devices/aids used by the patient prior to current illness, exacerbation or injury? None of the above   Current Functional Level Cognition   Arousal/Alertness: Awake/alert Overall Cognitive Status: Impaired/Different from baseline Current Attention Level: Selective Orientation Level: Oriented to person, Oriented to place, Disoriented to time, Disoriented to situation Following Commands: Follows one step commands consistently, Follows multi-step commands with increased time, Follows one step commands with increased time, Follows multi-step commands inconsistently Safety/Judgement: Decreased awareness of safety, Decreased awareness of deficits General Comments: Poor awareness of deficits and safety requiring cues and assist for safety.Difficulty problem solving navigating around obstacles with RW Attention: Sustained Sustained Attention: Impaired Sustained Attention Impairment: Functional basic, Functional complex Memory: Impaired Memory Impairment: Storage deficit, Retrieval  deficit, Decreased recall of new information Awareness: Impaired Awareness Impairment: Intellectual impairment Problem Solving: Impaired Problem Solving Impairment: Verbal basic, Functional basic Executive Function: Reasoning, Decision Making, Self Monitoring Reasoning: Impaired Reasoning Impairment: Verbal basic, Functional basic Decision Making: Impaired Decision Making Impairment: Verbal basic, Functional basic Self Monitoring: Impaired Self Monitoring Impairment: Verbal basic, Functional basic Safety/Judgment: Impaired Comments: poor safety awareness    Extremity Assessment (includes Sensation/Coordination)   Upper Extremity Assessment: RUE deficits/detail, LUE deficits/detail RUE Deficits / Details: shoulder flexion 100 degrees, undershooting with objects, able to grasp with pincher grasp RUE Coordination: decreased fine motor LUE Deficits / Details: grasp 4/5, able to raise UE or keep elevated when placed LUE Coordination: decreased gross motor, decreased fine motor Lower Extremity Assessment: Defer to PT evaluation RLE Deficits / Details: pt with grossly 2-/5 strength with significant edema RLE. Pt with consistent buckling in standing    ADLs   Overall ADL's : Needs assistance/impaired Eating/Feeding: Minimal assistance, Sitting Eating/Feeding Details (indicate cue type and reason): Min hand over hand to maintain grasp on cup while R hand pull tab to open sweet tea. Grooming: Min guard, Cueing for safety, Standing Grooming Details (indicate cue type and reason): Min Guard A for safety in standing due to decreased balance and awareness. Pt maintaining standing at sink to compelte oral care. Lower Body Dressing: Minimal assistance, Sit to/from stand Lower Body Dressing Details (indicate cue type and reason): Min A for initating donning sock on R foot. Toilet Transfer: Min guard, Ambulation, RW (simulated to at [employer]) Statistician Details (indicate cue type and reason): Min  guard A for safety Toileting - Clothing Manipulation Details (indicate cue type and reason): incontinent bowel and bladder in bed, total assist Functional mobility during ADLs: Minimal assistance, Rolling walker General ADL Comments: Pt requiring increased engaouragement this session due pain and pt with decreased future thinking and executive functioning. Pt agreeable to oral care at sink    Mobility   Overal bed mobility: Needs Assistance  Bed Mobility: Supine to Sit, Sit to Supine Rolling: Supervision Supine to sit: Supervision Sit to supine: Supervision General bed mobility comments: supervision for safety    Transfers   Overall transfer level: Needs assistance Equipment used: Rolling walker (2 wheeled) Transfers: Sit to/from Stand Sit to Stand: Min assist Squat pivot transfers: Max assist, +2 physical assistance  Lateral/Scoot Transfers: Max assist, +2 physical assistance, +2 safety/equipment General transfer comment: minA to rise and steady into standing. Cues for hand placement with RW. Sit to stand x 5 during session    Ambulation / Gait / Stairs / Wheelchair Mobility   Ambulation/Gait Ambulation/Gait assistance: Editor, commissioning (Feet): 120 Feet Assistive device: Rolling walker (2 wheeled) Gait Pattern/deviations: Narrow base of support, Step-to pattern, Step-through pattern, Decreased stride length, Decreased step length - right, Decreased weight shift to right, Decreased dorsiflexion - right General Gait Details: Cues for heel strike on R with poor follow through as patient fixated on R lower leg pain with ambulation and unwililng to attempt. MinA for balance and RW management at times. Difficulty problem solving navigation around obstacles in a uncontrolled environment Gait velocity: decreased Gait velocity interpretation: <1.31 ft/sec, indicative of household ambulator    Posture / Balance Dynamic Sitting Balance Sitting balance - Comments: requires supervision  for safety Balance Overall balance assessment: Needs assistance Sitting-balance support: Feet supported, No upper extremity supported Sitting balance-Leahy Scale: Good Sitting balance - Comments: requires supervision for safety Postural control: Left lateral lean Standing balance support: Bilateral upper extremity supported, During functional activity Standing balance-Leahy Scale: Poor Standing balance comment: reliant on external assist    Special needs/care consideration Abrasion to the L ear, Contact dermatitis to the right anterior thigh, ecchymosis to the R kneee        Previous Home Environment (from acute therapy documentation) Living Arrangements: Parent  Lives With: Family Available Help at Discharge: Family, Available 24 hours/day Type of Home: Mobile home Home Layout: One level Home Access: Stairs to enter [employer] of Steps: 2 Bathroom Shower/Tub: Psychologist, counselling, Nurse, adult Toilet: Handicapped height Additional Comments: sister Baird Lyons provided home information.   Discharge Living Setting Plans for Discharge Living Setting: House Type of Home at Discharge: Mobile home Discharge Home Layout: One level Discharge Home Access: Stairs to enter Entrance Stairs-Rails: None Entrance Stairs-Number of Steps: 2 Discharge Bathroom Shower/Tub: Walk-in shower, Tub/shower unit Discharge Bathroom Toilet: Handicapped height Discharge Bathroom Accessibility: Yes How Accessible: Accessible via walker Does the patient have any problems obtaining your medications?: No   Social/Family/Support Systems Patient Roles: Parent Contact Information: [phone] Anticipated Caregiver: Glenroy Crossen (mother) Anticipated Caregiver's Contact Information: [phone] Ability/Limitations of Caregiver: Can provide Min/mod A, familhy to rotate to provide care Caregiver Availability: 24/7 Discharge Plan Discussed with Primary Caregiver: Yes Is Caregiver In  Agreement with Plan?: Yes Does Caregiver/Family have Issues with Lodging/Transportation while Pt is in Rehab?: Yes     Goals Patient/Family Goal for Rehab: PT/OT/SLP Mod A Expected length of stay: 28-30 days Pt/Family Agrees to Admission and willing to participate: Yes Program Orientation Provided & Reviewed with Pt/Caregiver Including Roles  & Responsibilities: Yes     Decrease burden of Care through IP rehab admission: Specialzed equipment needs, Decrease number of caregivers, Bowel and bladder program and Patient/family education     Possible need for SNF placement upon discharge:not anticipated    Patient Condition: This patient's medical and functional status has changed since the consult dated: 11/26/2020 in which the Rehabilitation Physician determined and documented that the  patient's condition is appropriate for intensive rehabilitative care in an inpatient rehabilitation facility. See "History of Present Illness" (above) for medical update. Functional changes are: Pt. Transferring and ambulating at Min A level. Patient's medical and functional status update has been discussed with the Rehabilitation physician and patient remains appropriate for inpatient rehabilitation. Will admit to inpatient rehab today.  Preadmission Screen Completed By:  Jeronimo Greaves, CCC-SLP, 12/07/2020 10:26 AM ______________________________________________________________________   Discussed status with Dr. Riley Kill on 12/07/2020 at 945 and received approval for admission today.   Admission Coordinator:  Jeronimo Greaves, time 1000 Dorna Bloom 12/07/2020

## 2020-12-07 NOTE — Progress Notes (Signed)
Inpatient Rehab Admissions Coordinator:   I have a bed for this pt. On CIR and will plan to admit him today. RN may call report to [phone].  Megan Salon, MS, CCC-SLP Rehab Admissions Coordinator  [phone] (celll) [phone] (office)

## 2020-12-07 NOTE — Discharge Summary (Signed)
Physician Discharge Summary  Nathaniel Lynn Teale WJX:[id number] DOB: 06/15/1991 DOA: 11/10/2020  PCP: Pcp, No  Admit date: 11/10/2020 Discharge date: 12/07/2020  Admitted From: Home  Discharge disposition: CIR   Recommendations for Outpatient Follow-Up:   Check CBC, BMP, magnesium in the next few days. Please encourage oral intake.   Discharge Diagnosis:   Active Problems:   Status epilepticus (HCC)   Acute respiratory failure with hypoxemia (HCC)   Encephalopathy   Pressure injury of skin   AKI (acute kidney injury) (HCC)   Aspiration pneumonia (HCC)   Hypernatremia    Discharge Condition: Improved.  Diet recommendation: .  Wound care: None.  Code status: Full.   History of Present Illness:   Nathaniel Lynn Grindstaff is a 30 y.o. male with a history of narcotic abuse recently released from 2 year prison sentence was found in the reportedly getting high at his girlfriend's house on 5/14, [facility] to [facility] for evaluation. Labs notable for +UDS (amphetamines, opiates, cannabinoids), Cr 2.33 (baseline 0.8-1.2), CK level > 50K, with lactic acid of 3.0. CT Head was negative for acute pathology. In the ED, the patient was given Ativan x 2 for tonic-clonic seizure-like activity and was intubated shortly after seizure-like activity was noted. Concern for "posturing". Transferred to [facility] ICU for further evaluation and continuous EEG monitoring. Empiric antibiotics were given and withdrawn after unremarkable. LP performed 5/19 with acyclovir stopped after negative HSV. AKI improved with fluids. Tracheal aspirate grew stenotrophomonas and flavobacterium for which 10-day course of levaquin was started on 11/17/20. EEG demonstrated nonspecific moderate diffuse encephalopathy without epileptiform discharges. MRI brain suggested global hypoxic ischemic injury. Precedex was given for agitation and patient extubated 5/25. The patient was transferred to hospitalist service 10/29/20.  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  On chronic oxycodone.  Continue thiamine, folic acid, multivitamin.  Completed clonidine taper.  Overall improved.   Acute hypoxic respiratory failure: Resolved.  On room air.  Completed 10-day course of Levaquin on 11/27/2020   Acute kidney injury secondary to ATN: Resolved.  Latest creatinine of 0.8.  Patient refusing repeat blood work   Hypokalemia.  Refused blood work but noted to be 3.2 after discharge.  Will give 1 time dose of 40 mEq of potassium.  Hypernatremia: Resolved.  Latest sodium of 135.    Rhabdomyolysis: Improved.     Demand myocardial ischemia: Mild troponin elevation, 2D echocardiogram with no regional wall motion abnormalities.  No chest pain or significant EKG changes.   Essential hypertension Finished clonidine taper.  Latest blood pressure of 135/94   IVDU, Polysubstance abuse: TTE without any vegetations.     Lactic acidosis: Resolved.   Moderate protein calorie malnutrition: Present on admission.  Seen by nutrition service.  Continue nutritional supplementation. Patient's mom states that he likes the food from home.  Encouraged him to eat more.   LUE superficial thrombosis, RUE/ RLE edema: Ultrasound of the upper extremities without DVT.  Received prophylactic Lovenox.   Thrombocytosis: Likely reactive in nature.     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  Continue wound care.  Disposition.  At this time, patient is stable for disposition to CIR.  Unable to reach the patient's mom on the phone  Medical Consultants:   PCCM  Procedures:    Patient and mechanical ventilation Subjective:   Today, patient  was seen and examined at bedside.  Denies any nausea vomiting headache fever or chills.  Sleeping in bed   Discharge Exam:   Vitals:    12/07/20 0742 12/07/20 0747  BP: (!) 142/108 (!) (P) 140/101  Pulse: (!) 103   Resp: 18   Temp: 97.7 F (36.5 C)   SpO2: 100%    Vitals:   12/06/20 1710 12/06/20 1956 12/07/20 0742 12/07/20 0747  BP:  (!) 129/93 (!) 142/108 (!) (P) 140/101  Pulse: 100 (!) 108 (!) 103   Resp:  20 18   Temp:  98.3 F (36.8 C) 97.7 F (36.5 C)   TempSrc:  Oral Oral   SpO2:  100% 100%   Weight:        General: Alert awake, not in obvious distress HENT: pupils equally reacting to light,  No scleral pallor or icterus noted. Oral mucosa is moist.  Chest:  Clear breath sounds.  Diminished breath sounds bilaterally. No crackles or wheezes.  CVS: S1 &S2 heard. No murmur.  Regular rate and rhythm. Abdomen: Soft, nontender, nondistended.  Bowel sounds are heard.   Extremities: No cyanosis, clubbing or edema.  Peripheral pulses are palpable. Psych: Alert, awake and communicative, mildly sleepy CNS:  No cranial nerve deficits.  Moves all extremities. Skin: Warm and dry.    The results of significant diagnostics from this hospitalization (including imaging, microbiology, ancillary and laboratory) are listed below for reference.     Diagnostic Studies:   CT Head Wo Contrast  Result Date: 11/10/2020 CLINICAL DATA:  Altered mental status with non responsiveness EXAM: CT HEAD WITHOUT CONTRAST TECHNIQUE: Contiguous axial images were obtained from the base of the skull through the vertex without intravenous contrast. COMPARISON:  March 10, 2005 FINDINGS: Brain: Ventricles and sulci are normal in size and configuration. There is no intracranial mass, hemorrhage, extra-axial fluid collection, or midline shift. The brain parenchyma appears unremarkable. No appreciable acute infarct. Vascular: No hyperdense vessel. No appreciable vascular calcification. Skull: The bony calvarium appears intact. There is a small right frontal scalp hematoma. Sinuses/Orbits: There is mild mucosal thickening in the right maxillary  antrum. There is mucosal thickening in several ethmoid air cells. There is opacification in portions of the right sphenoid sinus. Orbits appear symmetric bilaterally. Other: Mastoid air cells are clear. IMPRESSION: Normal appearing brain parenchyma. No mass, hemorrhage, or extra-axial fluid collection. Small right frontal scalp hematoma with underlying bony calvarium intact. Paranasal sinus disease at several sites noted. Electronically Signed   By: Bretta Bang III M.D.   On: 11/10/2020 17:41   DG Chest Port 1 View  Result Date: 11/10/2020 CLINICAL DATA:  Hypoxia EXAM: PORTABLE CHEST 1 VIEW COMPARISON:  Nov 10, 2020 FINDINGS: Endotracheal tube tip is 3.4 cm above the carina. Nasogastric tube tip and side port are below the diaphragm. No pneumothorax. The lungs are clear. The heart size and pulmonary vascularity are normal. No adenopathy. No pneumothorax. No bone lesions. IMPRESSION: Tube positions as described without pneumothorax. Lungs clear. Heart size normal. Electronically Signed   By: Bretta Bang III M.D.   On: 11/10/2020 23:51   DG Chest Portable 1 View  Result Date: 11/10/2020 CLINICAL DATA:  Post intubation.  Patient found unresponsive. EXAM: PORTABLE CHEST 1 VIEW COMPARISON:  Chest radiograph 03/23/2016 FINDINGS: Cardiomediastinal contours are within normal limits. The endotracheal tube tip is located approximately 3 cm above the carina. The nasogastric tube side port projects over the stomach. There is a tiny opacity projecting over the right lung base.  The left lung is clear. No pneumothorax or large pleural effusion. No acute finding in the visualized skeleton. IMPRESSION: 1.  Appropriate placement of the endotracheal and nasogastric tubes. 2. Tiny opacity projecting over the right lung base, recommend attention on follow-up. Otherwise no acute finding. Electronically Signed   By: Emmaline Kluver M.D.   On: 11/10/2020 16:40     Labs:   Basic Metabolic Panel: Recent Labs   Lab 12/01/20 0950 12/07/20 0424  NA 135 135  K 3.6 3.2*  CL 100 97*  CO2 26 27  GLUCOSE 114* 106*  BUN 15 10  CREATININE 0.88 0.87  CALCIUM 9.5 9.4  MG 2.0 1.9  PHOS 3.5  --    GFR Estimated Creatinine Clearance: 130.5 mL/min (by C-G formula based on SCr of 0.87 mg/dL). Liver Function Tests: No results for input(s): AST, ALT, ALKPHOS, BILITOT, PROT, ALBUMIN in the last 168 hours. No results for input(s): LIPASE, AMYLASE in the last 168 hours. No results for input(s): AMMONIA in the last 168 hours. Coagulation profile No results for input(s): INR, PROTIME in the last 168 hours.  CBC: Recent Labs  Lab 12/01/20 0950 12/07/20 0424  WBC 10.6* 6.6  HGB 12.3* 11.8*  HCT 35.4* 34.0*  MCV 87.6 87.6  PLT 596* 438*   Cardiac Enzymes: No results for input(s): CKTOTAL, CKMB, CKMBINDEX, TROPONINI in the last 168 hours. BNP: Invalid input(s): POCBNP CBG: Recent Labs  Lab 12/06/20 0602 12/06/20 1142 12/06/20 1707 12/06/20 2207 12/07/20 0640  GLUCAP 120* 131* 153* 115* 118*   D-Dimer No results for input(s): DDIMER in the last 72 hours. Hgb A1c No results for input(s): HGBA1C in the last 72 hours. Lipid Profile No results for input(s): CHOL, HDL, LDLCALC, TRIG, CHOLHDL, LDLDIRECT in the last 72 hours. Thyroid function studies No results for input(s): TSH, T4TOTAL, T3FREE, THYROIDAB in the last 72 hours.  Invalid input(s): FREET3 Anemia work up No results for input(s): VITAMINB12, FOLATE, FERRITIN, TIBC, IRON, RETICCTPCT in the last 72 hours. Microbiology No results found for this or any previous visit (from the past 240 hour(s)).   Discharge Instructions:   Discharge Instructions     Diet general   Complete by: As directed    Soft diet. patient prefers home food.   Discharge instructions   Complete by: As directed    Continue rehabilitation.  Take medications as prescribed   Discharge wound care:   Complete by: As directed    Apply Bactroban to left ear Q  day, then cover with foam dressing.  Change foam dressing Q 3 days or PRN soiling   Increase activity slowly   Complete by: As directed       Allergies as of 12/07/2020       Reactions   Penicillins    Pt states he is allergic to penicillin. He said he was told that but he does not know what the reaction is.        Medication List     TAKE these medications    (feeding supplement) PROSource Plus liquid Take 30 mLs by mouth 2 (two) times daily between meals.   acetaminophen 325 MG tablet Commonly known as: TYLENOL Take 2 tablets (650 mg total) by mouth every 6 (six) hours as needed for fever, headache or mild pain.   docusate sodium 100 MG capsule Commonly known as: COLACE Take 1 capsule (100 mg total) by mouth 2 (two) times daily.   folic acid 1 MG tablet Commonly known as: FOLVITE Take  1 tablet (1 mg total) by mouth daily.   ipratropium-albuterol 0.5-2.5 (3) MG/3ML Soln Commonly known as: DUONEB Take 3 mLs by nebulization every 6 (six) hours as needed.   mouth rinse Liqd solution 15 mLs by Mouth Rinse route 2 (two) times daily.   multivitamin with minerals Tabs tablet Take 1 tablet by mouth daily.   mupirocin cream 2 % Commonly known as: BACTROBAN Apply topically daily.   oxyCODONE 5 MG immediate release tablet Commonly known as: Oxy IR/ROXICODONE Take 1 tablet (5 mg total) by mouth 2 (two) times daily.   polyethylene glycol 17 g packet Commonly known as: MIRALAX / GLYCOLAX Take 17 g by mouth daily.   polyvinyl alcohol 1.4 % ophthalmic solution Commonly known as: LIQUIFILM TEARS Place 1 drop into both eyes 3 (three) times daily.   QUEtiapine 100 MG tablet Commonly known as: SEROQUEL Take 1 tablet (100 mg total) by mouth at bedtime.   QUEtiapine 50 MG tablet Commonly known as: SEROQUEL Take 1 tablet (50 mg total) by mouth in the morning. Start taking on: December 08, 2020   thiamine 100 MG tablet Take 1 tablet (100 mg total) by mouth daily.                Discharge Care Instructions  (From admission, onward)           Start     Ordered   12/07/20 0000  Discharge wound care:       Comments: Apply Bactroban to left ear Q day, then cover with foam dressing.  Change foam dressing Q 3 days or PRN soiling   12/07/20 1011              Time coordinating discharge: 39 minutes  Signed:  Joseandres Mazer  [facility] 12/07/2020, 10:13 AM

## 2020-12-07 NOTE — H&P (Signed)
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    HPI: Nathaniel Lynn is a 30 year old male with history of polysubstance abuse who was admitted to [facility] on 11/10/20 after being found unresponsive.  His girlfriend reported that they were getting high and he became unresponsive but did not respond to Narcan.  He was found to have acute kidney injury with rhabdomyolysis, tachycardia with dilated pupils and posturing with seizure type activity.  CT head negative for acute changes.  CK> 50,000 and UDS positive for amphetamine, opiates.  He was started on IV Keppra as well as IV fluids for hydration.  Elevated troponins noted and felt to be due to demand anemia.  He was intubated in ED and transferred to [facility] for LT-EEG monitoring.  He was started on fentanyl, thiamine and propofol and monitored for withdrawal.  2D echo done showing EF 50 to 55% with no wall abnormality and trivial MVR/TVR.  EEG was negative for seizures and AEDs not needed per neurology input.  He was initially covered with vancomycin, ceftriaxone and acyclovir due to concerns of meningitis/encephalitis.  MRI brain done revealing restricted diffusion bilateral perirolandic area question due to hypoxic ischemic injury, uremia toxic metabolic encephalopathy.  LP by IR was not indicated of infection and antibiotics were DC'd on 05/20.    He had issues with severe agitation with weaning of sedation-propofol was resumed.  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  No concurrent EEG changes were noted during these episodes and but not felt to be epileptic in nature per Dr. Melynda Ripple.  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  He tolerated extubation his mentation was  improving neurology and off.  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  Acute renal failure has resolved and hypokalemia being supplemented intermittently.  Acute Blood loss anemia is improving and abnormal LFTs have resolved.  He continues to have issues with higher level cognitive deficits, issues with problem solving as well as right sided weakness with  worsening of RLE pain in the past 2 days. CIR recommended due to functional decline.      Review of Systems  Constitutional:  Negative for chills and fever.  HENT:  Negative for hearing loss and tinnitus.   Eyes:  Negative for blurred vision and double vision.  Respiratory:  Negative for cough and wheezing.   Cardiovascular:  Negative for chest pain and palpitations.  Gastrointestinal:  Negative for constipation, heartburn and nausea.  Genitourinary:  Negative for dysuria and urgency.  Musculoskeletal:  Negative for myalgias.  Skin:  Negative for rash.  Neurological:  Positive for sensory change (RLE with stinging pain) and focal weakness (right foot drop). Negative for dizziness and headaches.  Psychiatric/Behavioral:  Positive for memory loss. The patient is nervous/anxious.     Past Medical History:  Diagnosis Date   IVDU (intravenous drug user)     History reviewed. No pertinent surgical history.   History reviewed. No pertinent family history.   Social History:  has no history on file for tobacco use, alcohol use, and drug use.   Allergies  Allergen Reactions   Penicillins  Pt states he is allergic to penicillin. He said he was told that but he does not know what the reaction is.    No medications prior to admission.    Drug Regimen Review  Drug regimen was reviewed and remains appropriate with no significant issues identified  Home: Home Living Family/patient expects to be discharged to:: Private  residence Living Arrangements: Parent Available Help at Discharge: Family, Available 24 hours/day Type of Home: Mobile home Home Access: Stairs to enter [employer] of Steps: 2 Home Layout: One level Bathroom Shower/Tub: Psychologist, counselling, Nurse, adult Toilet: Handicapped height Home Equipment: None Additional Comments: sister Baird Lyons provided home information.  Lives With: Family   Functional History: Prior Function Level of Independence: Independent  Functional Status:  Mobility: Bed Mobility Overal bed mobility: Needs Assistance Bed Mobility: Supine to Sit, Sit to Supine Rolling: Supervision Supine to sit: Supervision Sit to supine: Supervision General bed mobility comments: supervision for safety Transfers Overall transfer level: Needs assistance Equipment used: Rolling walker (2 wheeled) Transfers: Sit to/from Stand Sit to Stand: Min assist Squat pivot transfers: Max assist, +2 physical assistance  Lateral/Scoot Transfers: Max assist, +2 physical assistance, +2 safety/equipment General transfer comment: minA to rise and steady into standing. Cues for hand placement with RW. Sit to stand x 5 during session Ambulation/Gait Ambulation/Gait assistance: Min assist Gait Distance (Feet): 120 Feet Assistive device: Rolling walker (2 wheeled) Gait Pattern/deviations: Narrow base of support, Step-to pattern, Step-through pattern, Decreased stride length, Decreased step length - right, Decreased weight shift to right, Decreased dorsiflexion - right General Gait Details: Cues for heel strike on R with poor follow through as patient fixated on R lower leg pain with ambulation and unwililng to attempt. MinA for balance and RW management at times. Difficulty problem solving navigation around obstacles in a uncontrolled environment Gait velocity: decreased Gait velocity interpretation: <1.31 ft/sec, indicative of household ambulator    ADL: ADL Overall ADL's :  Needs assistance/impaired Eating/Feeding: Minimal assistance, Sitting Eating/Feeding Details (indicate cue type and reason): Min hand over hand to maintain grasp on cup while R hand pull tab to open sweet tea. Grooming: Min guard, Cueing for safety, Standing Grooming Details (indicate cue type and reason): Min Guard A for safety in standing due to decreased balance and awareness. Pt maintaining standing at sink to compelte oral care. Lower Body Dressing: Minimal assistance, Sit to/from stand Lower Body Dressing Details (indicate cue type and reason): Min A for initating donning sock on R foot. Toilet Transfer: Min guard, Ambulation, RW (simulated to at [employer]) Statistician Details (indicate cue type and reason): Min guard A for safety Toileting - Clothing Manipulation Details (indicate cue type and reason): incontinent bowel and bladder in bed, total assist Functional mobility during ADLs: Minimal assistance, Rolling walker General ADL Comments: Pt requiring increased engaouragement this session due pain and pt with decreased future thinking and executive functioning. Pt agreeable to oral care at sink  Cognition: Cognition Overall Cognitive Status: Impaired/Different from baseline Arousal/Alertness: Awake/alert Orientation Level: Oriented to person, Oriented to place, Disoriented to time, Disoriented to situation Attention: Sustained Sustained Attention: Impaired Sustained Attention Impairment: Functional basic, Functional complex Memory: Impaired Memory Impairment: Storage deficit, Retrieval deficit, Decreased recall of new information Awareness: Impaired Awareness Impairment: Intellectual impairment Problem Solving: Impaired Problem Solving Impairment: Verbal basic, Functional basic Executive Function: Reasoning, Decision Making, Self Monitoring Reasoning: Impaired Reasoning Impairment: Verbal basic, Functional basic Decision Making: Impaired Decision Making Impairment: Verbal basic,  Functional basic Self Monitoring: Impaired Self Monitoring  Impairment: Verbal basic, Functional basic Safety/Judgment: Impaired Comments: poor safety awareness Cognition Arousal/Alertness: Awake/alert Behavior During Therapy: WFL for tasks assessed/performed Overall Cognitive Status: Impaired/Different from baseline Area of Impairment: Following commands, Safety/judgement, Problem solving, Attention, Awareness, Memory, Orientation Orientation Level: Disoriented to, Time Current Attention Level: Selective Memory: Decreased short-term memory, Decreased recall of precautions Following Commands: Follows one step commands consistently, Follows multi-step commands with increased time, Follows one step commands with increased time, Follows multi-step commands inconsistently Safety/Judgement: Decreased awareness of safety, Decreased awareness of deficits Awareness: Intellectual Problem Solving: Slow processing, Decreased initiation, Difficulty sequencing, Requires verbal cues, Requires tactile cues General Comments: Poor awareness of deficits and safety requiring cues and assist for safety.Difficulty problem solving navigating around obstacles with RW   Blood pressure (!) (P) 140/101, pulse (!) 103, temperature 97.7 F (36.5 C), temperature source Oral, resp. rate 18, weight 75.3 kg, SpO2 100 %. Physical Exam Vitals and nursing note reviewed.  Constitutional:      General: He is in acute distress.     Appearance: He is ill-appearing.  HENT:     Head: Normocephalic and atraumatic.     Right Ear: External ear normal.     Left Ear: External ear normal.     Nose: Nose normal.  Eyes:     Extraocular Movements: Extraocular movements intact.     Pupils: Pupils are equal, round, and reactive to light.  Cardiovascular:     Rate and Rhythm: Regular rhythm. Tachycardia present.     Heart sounds: No murmur heard.   No gallop.  Pulmonary:     Effort: No respiratory distress.     Breath sounds: No  stridor. No wheezing.  Abdominal:     General: Bowel sounds are normal. There is no distension.     Palpations: Abdomen is soft.  Musculoskeletal:        General: Tenderness (RLE) present.     Cervical back: Normal range of motion.  Skin:    General: Skin is warm.     Comments: Multiple healed scars from old dog bites on RLE.   Neurological:     Comments: Speech clear but impulsive with poor safety awareness. Distracted. No focal CN findings. UE motor 5/5. LLE 4-5/5. RLE limited distally d/t pain. Pt with sensory loss below knee predominantly in the anterior leg and foot although he was diminished posteriorly in the calf, sole of foot as well. Additional sensory loss in the posterior thigh.  Significant RLE weakness with right foot drop and dysesthesias right calf/shin. KE appears to at least 3/5 but difficult to test d/t pain with resistance against lower leg. KF 2-3/5 but difficult to test d/t pain. Pt has 5/5 HAD and HAB. 0/5 ADF and 2/5 APF. DTR's 1+ to absent    Results for orders placed or performed during the hospital encounter of 11/10/20 (from the past 48 hour(s))  Glucose, capillary     Status: Abnormal   Collection Time: 12/05/20 10:58 AM  Result Value Ref Range   Glucose-Capillary 106 (H) 70 - 99 mg/dL    Comment: Glucose reference range applies only to samples taken after fasting for at least 8 hours.  Glucose, capillary     Status: Abnormal   Collection Time: 12/05/20  4:07 PM  Result Value Ref Range   Glucose-Capillary 113 (H) 70 - 99 mg/dL    Comment: Glucose reference range applies only to samples taken after fasting for at least 8 hours.  Glucose, capillary     Status: Abnormal  Collection Time: 12/05/20  9:12 PM  Result Value Ref Range   Glucose-Capillary 175 (H) 70 - 99 mg/dL    Comment: Glucose reference range applies only to samples taken after fasting for at least 8 hours.   Comment 1 Notify RN    Comment 2 Document in Chart   Glucose, capillary     Status:  Abnormal   Collection Time: 12/06/20  6:02 AM  Result Value Ref Range   Glucose-Capillary 120 (H) 70 - 99 mg/dL    Comment: Glucose reference range applies only to samples taken after fasting for at least 8 hours.   Comment 1 Notify RN    Comment 2 Document in Chart   Glucose, capillary     Status: Abnormal   Collection Time: 12/06/20 11:42 AM  Result Value Ref Range   Glucose-Capillary 131 (H) 70 - 99 mg/dL    Comment: Glucose reference range applies only to samples taken after fasting for at least 8 hours.  Glucose, capillary     Status: Abnormal   Collection Time: 12/06/20  5:07 PM  Result Value Ref Range   Glucose-Capillary 153 (H) 70 - 99 mg/dL    Comment: Glucose reference range applies only to samples taken after fasting for at least 8 hours.   Comment 1 Notify RN    Comment 2 Document in Chart   Glucose, capillary     Status: Abnormal   Collection Time: 12/06/20 10:07 PM  Result Value Ref Range   Glucose-Capillary 115 (H) 70 - 99 mg/dL    Comment: Glucose reference range applies only to samples taken after fasting for at least 8 hours.  Basic metabolic panel     Status: Abnormal   Collection Time: 12/07/20  4:24 AM  Result Value Ref Range   Sodium 135 135 - 145 mmol/L   Potassium 3.2 (L) 3.5 - 5.1 mmol/L   Chloride 97 (L) 98 - 111 mmol/L   CO2 27 22 - 32 mmol/L   Glucose, Bld 106 (H) 70 - 99 mg/dL    Comment: Glucose reference range applies only to samples taken after fasting for at least 8 hours.   BUN 10 6 - 20 mg/dL   Creatinine, Ser 1.610.87 0.61 - 1.24 mg/dL   Calcium 9.4 8.9 - 09.610.3 mg/dL   GFR, Estimated >04>60 >54>60 mL/min    Comment: (NOTE) Calculated using the CKD-EPI Creatinine Equation (2021)    Anion gap 11 5 - 15    Comment: Performed at [facility], [address] [place], [place] [address]  CBC     Status: Abnormal   Collection Time: 12/07/20  4:24 AM  Result Value Ref Range   WBC 6.6 4.0 - 10.5 K/uL   RBC 3.88 (L) 4.22 - 5.81 MIL/uL   Hemoglobin  11.8 (L) 13.0 - 17.0 g/dL   HCT 91.434.0 (L) 78.239.0 - 95.652.0 %   MCV 87.6 80.0 - 100.0 fL   MCH 30.4 26.0 - 34.0 pg   MCHC 34.7 30.0 - 36.0 g/dL   RDW 21.312.2 08.611.5 - 57.815.5 %   Platelets 438 (H) 150 - 400 K/uL   nRBC 0.0 0.0 - 0.2 %    Comment: Performed at [facility], [address] [place], [place] [address]  Magnesium     Status: None   Collection Time: 12/07/20  4:24 AM  Result Value Ref Range   Magnesium 1.9 1.7 - 2.4 mg/dL    Comment: Performed at [facility], [address], [place], [place] [address]  Glucose, capillary     Status: Abnormal   Collection Time: 12/07/20  6:40 AM  Result Value Ref Range   Glucose-Capillary 118 (H) 70 - 99 mg/dL    Comment: Glucose reference range applies only to samples taken after fasting for at least 8 hours.   No results found.     Medical Problem List and Plan: 1.   Functional deficits secondary to anoxic brain injury and rhabdomyolysis. 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  -patient may shower  -ELOS/Goals: 28-30 days, mod assist goals with PT, OT, SLP 2.  Antithrombotics: -DVT/anticoagulation:  Pharmaceutical: Lovenox  -antiplatelet therapy:  N/a 3. Right lower extremity dysesthetic pain:   -pt denied any back pain today and there was none one exam either. Exam most consistent with sciatic nerve injury in the proximal right thigh which is entirely possible given his rhabdomyolysis/down-time. I don't think it's related to his ABI either. Continue Oxycodone BID with tylenol prn.   -begin lyrica for RLE  .  4. Mood:  LCSW to follow for evaluation and support.   -antipsychotic agents: N/A 5. Neuropsych: This patient is not fully capable of making decisions on his own behalf. 6. Skin/Wound Care: Pressure relief measures. Will add juven for supplement (tastes better than prosource)  --Will add vitamin C and Zinc.  7. Fluids/Electrolytes/Nutrition: Monitor I/O. Check CMET in am.  8. Polysubstance  abuse/Hypoxic encephalopathy: Has completed clonidine taper to help with withdrawal  --Oxycodone has been weaned down to 5 mg bid as off on 05/30.    --Seroquel 100 mg hs/50 mg daily. Monitor sleep wake cycle.  --Continue thiamine and folic acid for supplement.   --posturing due to ABI/no seizures documented.   9. Aspiration PNA: Completed IV Vanc on 05/20. Respiratory status stable 10. Acute blood loss anemia: Will continue to monitor for signs of bleeding. --is recovering  11. Acute renal failure: Due to rhabdomyolysis has resolved with improvement in K-159 12. Blood pressure: Intermittent elevation noted.  --continue to monitor for now 13. Hypokalemia: Was supplemented. Po intake reported to be poor and refusing supplements.  --recheck potassium level in am.  14. Thrombocytosis: Slowly trending down 15. Superficial thrombosis LUE: Local measures with elevation for edema management/ heat prn.       Jacquelynn Cree, PA-C 12/07/2020

## 2020-12-07 NOTE — H&P (Signed)
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      HPI: Nathaniel Lynn is a 30 year old male with history of polysubstance abuse who was [facility] 11/10/20 after being found unresponsive.  His girlfriend reported that they were getting high and he became unresponsive but did not respond to Narcan.  He was found to have acute kidney injury with rhabdomyolysis, tachycardia with dilated pupils and posturing with seizure type activity.  CT head negative for acute changes.  CK> 50,000 and UDS positive for amphetamine, opiates.  He was started on IV Keppra as well as IV fluids for hydration.  Elevated troponins noted and felt to be due to demand anemia.  He was intubated in ED and [facility]-EEG monitoring.  He was started on fentanyl, thiamine and propofol and monitored for withdrawal.  2D echo done showing EF 50 to 55% with no wall abnormality and trivial MVR/TVR.  EEG was negative for seizures and AEDs not needed per neurology input.  He was initially covered with vancomycin, ceftriaxone and acyclovir due to concerns of meningitis/encephalitis.  MRI brain done revealing restricted diffusion bilateral perirolandic area question due to hypoxic ischemic injury, uremia toxic metabolic encephalopathy.  LP by IR was not indicated of infection and antibiotics were DC'd on 05/20.     He had issues with severe agitation with weaning of sedation-propofol was resumed.  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  No concurrent EEG changes were noted during these episodes and but not felt to be epileptic in nature per Dr. Melynda RippleYadav.  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  He tolerated extubation his mentation was  improving neurology and off.  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  Acute renal failure has resolved and hypokalemia being supplemented intermittently.  Acute Blood loss anemia is improving and abnormal LFTs have resolved.  He continues to have issues with higher level cognitive deficits, issues with problem solving as well as right sided weakness with  worsening of RLE pain in the past 2 days. CIR recommended due to functional decline.         Review of Systems Constitutional:  Negative for chills and fever. HENT:  Negative for hearing loss and tinnitus.   Eyes:  Negative for blurred vision and double vision. Respiratory:  Negative for cough and wheezing.   Cardiovascular:  Negative for chest pain and palpitations. Gastrointestinal:  Negative for constipation, heartburn and nausea. Genitourinary:  Negative for dysuria and urgency. Musculoskeletal:  Negative for myalgias. Skin:  Negative for rash. Neurological:  Positive for sensory change (RLE with stinging pain) and focal weakness (right foot drop). Negative for dizziness and headaches. Psychiatric/Behavioral:  Positive for memory loss. The patient is nervous/anxious.           Past Medical History:  Diagnosis Date   IVDU (intravenous drug user)        History reviewed. No pertinent surgical history.     History reviewed. No pertinent family history.     Social History:  has no history on file for tobacco use, alcohol use, and drug use.  Allergies  Allergen Reactions   Penicillins        Pt states he is allergic to penicillin. He said he was told that but he does not know what the reaction is.      No medications prior to admission.      Drug Regimen Review  Drug regimen was reviewed and remains appropriate with no significant issues identified   Home: Home Living Family/patient expects to be  discharged to:: Private residence Living Arrangements: Parent Available Help at Discharge: Family, Available 24 hours/day Type of Home: Mobile home Home Access: Stairs to enter [employer] of Steps: 2 Home Layout: One level Bathroom Shower/Tub: Psychologist, counselling, Nurse, adult Toilet: Handicapped height Home Equipment: None Additional Comments: sister Baird Lyons provided home information.  Lives With: Family   Functional History: Prior Function Level of Independence: Independent   Functional Status:  Mobility: Bed Mobility Overal bed mobility: Needs Assistance Bed Mobility: Supine to Sit, Sit to Supine Rolling: Supervision Supine to sit: Supervision Sit to supine: Supervision General bed mobility comments: supervision for safety Transfers Overall transfer level: Needs assistance Equipment used: Rolling walker (2 wheeled) Transfers: Sit to/from Stand Sit to Stand: Min assist Squat pivot transfers: Max assist, +2 physical assistance  Lateral/Scoot Transfers: Max assist, +2 physical assistance, +2 safety/equipment General transfer comment: minA to rise and steady into standing. Cues for hand placement with RW. Sit to stand x 5 during session Ambulation/Gait Ambulation/Gait assistance: Min assist Gait Distance (Feet): 120 Feet Assistive device: Rolling walker (2 wheeled) Gait Pattern/deviations: Narrow base of support, Step-to pattern, Step-through pattern, Decreased stride length, Decreased step length - right, Decreased weight shift to right, Decreased dorsiflexion - right General Gait Details: Cues for heel strike on R with poor follow through as patient fixated on R lower leg pain with ambulation and unwililng to attempt. MinA for balance and RW management at times. Difficulty problem solving navigation around obstacles in a uncontrolled environment Gait velocity: decreased Gait velocity interpretation: <1.31 ft/sec, indicative of household ambulator    ADL: ADL Overall ADL's : Needs assistance/impaired Eating/Feeding: Minimal assistance, Sitting Eating/Feeding Details (indicate cue type and reason): Min hand over hand to maintain grasp on cup while R hand pull tab to open sweet tea. Grooming: Min guard, Cueing for safety, Standing Grooming Details (indicate cue type and reason): Min Guard A for safety in standing due to decreased balance and awareness. Pt maintaining standing at sink to compelte oral care. Lower Body Dressing: Minimal assistance, Sit to/from stand Lower Body Dressing Details (indicate cue type and reason): Min A for initating donning sock on R foot. Toilet Transfer: Min guard, Ambulation, RW (simulated to at [employer]) Statistician Details (indicate cue type and reason): Min guard A for safety Toileting - Clothing Manipulation Details (indicate cue type and reason): incontinent bowel and bladder in bed, total assist Functional mobility during ADLs: Minimal assistance, Rolling walker General ADL Comments: Pt requiring increased engaouragement this session due pain and pt with decreased future thinking and executive functioning. Pt agreeable to oral care at sink   Cognition: Cognition Overall Cognitive Status: Impaired/Different from baseline Arousal/Alertness: Awake/alert Orientation Level: Oriented to person, Oriented to place, Disoriented to time, Disoriented to situation Attention: Sustained Sustained Attention: Impaired Sustained Attention Impairment: Functional basic, Functional complex Memory: Impaired Memory Impairment: Storage deficit, Retrieval deficit, Decreased recall of new information Awareness: Impaired Awareness Impairment: Intellectual impairment Problem Solving: Impaired Problem Solving Impairment: Verbal basic, Functional basic Executive Function: Reasoning, Decision Making, Self Monitoring Reasoning: Impaired Reasoning Impairment:  Verbal basic, Functional basic Decision Making: Impaired Decision  Making Impairment: Verbal basic, Functional basic Self Monitoring: Impaired Self Monitoring Impairment: Verbal basic, Functional basic Safety/Judgment: Impaired Comments: poor safety awareness Cognition Arousal/Alertness: Awake/alert Behavior During Therapy: WFL for tasks assessed/performed Overall Cognitive Status: Impaired/Different from baseline Area of Impairment: Following commands, Safety/judgement, Problem solving, Attention, Awareness, Memory, Orientation Orientation Level: Disoriented to, Time Current Attention Level: Selective Memory: Decreased short-term memory, Decreased recall of precautions Following Commands: Follows one step commands consistently, Follows multi-step commands with increased time, Follows one step commands with increased time, Follows multi-step commands inconsistently Safety/Judgement: Decreased awareness of safety, Decreased awareness of deficits Awareness: Intellectual Problem Solving: Slow processing, Decreased initiation, Difficulty sequencing, Requires verbal cues, Requires tactile cues General Comments: Poor awareness of deficits and safety requiring cues and assist for safety.Difficulty problem solving navigating around obstacles with RW     Blood pressure (!) (P) 140/101, pulse (!) 103, temperature 97.7 F (36.5 C), temperature source Oral, resp. rate 18, weight 75.3 kg, SpO2 100 %. Physical Exam Vitals and nursing note reviewed. Constitutional:      General: He is in acute distress.    Appearance: He is ill-appearing. HENT:    Head: Normocephalic and atraumatic.    Right Ear: External ear normal.    Left Ear: External ear normal.    Nose: Nose normal. Eyes:    Extraocular Movements: Extraocular movements intact.    Pupils: Pupils are equal, round, and reactive to light. Cardiovascular:    Rate and Rhythm: Regular rhythm. Tachycardia present.    Heart sounds: No murmur heard.   No gallop. Pulmonary:    Effort: No respiratory distress.     Breath sounds: No stridor. No wheezing. Abdominal:    General: Bowel sounds are normal. There is no distension.    Palpations: Abdomen is soft. Musculoskeletal:        General: Tenderness (RLE) present.    Cervical back: Normal range of motion. Skin:    General: Skin is warm.    Comments: Multiple healed scars from old dog bites on RLE.   Neurological:    Comments: Speech clear but impulsive with poor safety awareness. Distracted. No focal CN findings. UE motor 5/5. LLE 4-5/5. RLE limited distally d/t pain. Pt with sensory loss below knee predominantly in the anterior leg and foot although he was diminished posteriorly in the calf, sole of foot as well. Additional sensory loss in the posterior thigh.  Significant RLE weakness with right foot drop and dysesthesias right calf/shin. KE appears to at least 3/5 but difficult to test d/t pain with resistance against lower leg. KF 2-3/5 but difficult to test d/t pain. Pt has 5/5 HAD and HAB. 0/5 ADF and 2/5 APF. DTR's 1+ to absent      Lab Results Last 48 Hours        Results for orders placed or performed during the hospital encounter of 11/10/20 (from the past 48 hour(s))  Glucose, capillary     Status: Abnormal    Collection Time: 12/05/20 10:58 AM  Result Value Ref Range    Glucose-Capillary 106 (H) 70 - 99 mg/dL      Comment: Glucose reference range applies only to samples taken after fasting for at least 8 hours.  Glucose, capillary     Status: Abnormal    Collection Time: 12/05/20  4:07 PM  Result Value Ref Range    Glucose-Capillary 113 (H) 70 - 99 mg/dL      Comment: Glucose reference range applies  only to samples taken after fasting for at least 8 hours.  Glucose, capillary     Status: Abnormal    Collection Time: 12/05/20  9:12 PM  Result Value Ref Range    Glucose-Capillary 175 (H) 70 - 99 mg/dL      Comment: Glucose reference range applies only to samples taken after fasting for at least 8 hours.    Comment 1 Notify RN       Comment 2 Document in Chart    Glucose, capillary     Status: Abnormal    Collection Time: 12/06/20  6:02 AM  Result Value Ref Range    Glucose-Capillary 120 (H) 70 - 99 mg/dL      Comment: Glucose reference range applies only to samples taken after fasting for at least 8 hours.    Comment 1 Notify RN      Comment 2 Document in Chart    Glucose, capillary     Status: Abnormal    Collection Time: 12/06/20 11:42 AM  Result Value Ref Range    Glucose-Capillary 131 (H) 70 - 99 mg/dL      Comment: Glucose reference range applies only to samples taken after fasting for at least 8 hours.  Glucose, capillary     Status: Abnormal    Collection Time: 12/06/20  5:07 PM  Result Value Ref Range    Glucose-Capillary 153 (H) 70 - 99 mg/dL      Comment: Glucose reference range applies only to samples taken after fasting for at least 8 hours.    Comment 1 Notify RN      Comment 2 Document in Chart    Glucose, capillary     Status: Abnormal    Collection Time: 12/06/20 10:07 PM  Result Value Ref Range    Glucose-Capillary 115 (H) 70 - 99 mg/dL      Comment: Glucose reference range applies only to samples taken after fasting for at least 8 hours.  Basic metabolic panel     Status: Abnormal    Collection Time: 12/07/20  4:24 AM  Result Value Ref Range    Sodium 135 135 - 145 mmol/L    Potassium 3.2 (L) 3.5 - 5.1 mmol/L    Chloride 97 (L) 98 - 111 mmol/L    CO2 27 22 - 32 mmol/L    Glucose, Bld 106 (H) 70 - 99 mg/dL      Comment: Glucose reference range applies only to samples taken after fasting for at least 8 hours.    BUN 10 6 - 20 mg/dL    Creatinine, Ser 1.61 0.61 - 1.24 mg/dL    Calcium 9.4 8.9 - 09.6 mg/dL    GFR, Estimated >04 >54 mL/min      Comment: (NOTE) Calculated using the CKD-EPI Creatinine Equation (2021)      Anion gap 11 5 - 15      Comment: Performed at [facility], [address], [place], [place] [address]  CBC     Status: Abnormal    Collection Time: 12/07/20  4:24  AM  Result Value Ref Range    WBC 6.6 4.0 - 10.5 K/uL    RBC 3.88 (L) 4.22 - 5.81 MIL/uL    Hemoglobin 11.8 (L) 13.0 - 17.0 g/dL    HCT 91.4 (L) 78.2 - 52.0 %    MCV 87.6 80.0 - 100.0 fL    MCH 30.4 26.0 - 34.0 pg    MCHC 34.7 30.0 - 36.0 g/dL  RDW 12.2 11.5 - 15.5 %    Platelets 438 (H) 150 - 400 K/uL    nRBC 0.0 0.0 - 0.2 %      Comment: Performed at [facility], [address], [place], [place] [address]  Magnesium     Status: None    Collection Time: 12/07/20  4:24 AM  Result Value Ref Range    Magnesium 1.9 1.7 - 2.4 mg/dL      Comment: Performed at [facility], [address] [place], [place] [address]  Glucose, capillary     Status: Abnormal    Collection Time: 12/07/20  6:40 AM  Result Value Ref Range    Glucose-Capillary 118 (H) 70 - 99 mg/dL      Comment: Glucose reference range applies only to samples taken after fasting for at least 8 hours.      Imaging Results (Last 48 hours)  No results found.           Medical Problem List and Plan: 1.   Functional deficits secondary to anoxic brain injury and rhabdomyolysis. 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             -patient may shower             -ELOS/Goals: 28-30 days, mod assist goals with PT, OT, SLP 2.  Antithrombotics: -DVT/anticoagulation:  Pharmaceutical: Lovenox             -antiplatelet therapy:  N/a 3. Right lower extremity dysesthetic pain:              -pt denied any back pain today and there was none one exam either. Exam most consistent with sciatic nerve injury in the proximal right thigh which is entirely possible given his rhabdomyolysis/down-time. I don't think it's related to his ABI either. Continue Oxycodone BID with tylenol prn.              -begin lyrica for RLE  . 4. Mood:  LCSW to follow for evaluation and support.              -antipsychotic agents: N/A 5. Neuropsych: This patient is not fully capable of making decisions on his own behalf. 6.  Skin/Wound Care: Pressure relief measures. Will add juven for supplement (tastes better than prosource)             --Will add vitamin C and Zinc. 7. Fluids/Electrolytes/Nutrition: Monitor I/O. Check CMET in am. 8. Polysubstance abuse/Hypoxic encephalopathy: Has completed clonidine taper to help with withdrawal             --Oxycodone has been weaned down to 5 mg bid as off on 05/30.               --Seroquel 100 mg hs/50 mg daily. Monitor sleep wake cycle.             --Continue thiamine and folic acid for supplement.             --posturing due to ABI/no seizures documented.   9. Aspiration PNA: Completed IV Vanc on 05/20. Respiratory status stable 10. Acute blood loss anemia: Will continue to monitor for signs of bleeding. --is recovering 11. Acute renal failure: Due to rhabdomyolysis has resolved with improvement in K-159 12. Blood pressure: Intermittent elevation noted.             --continue to monitor for now 13. Hypokalemia: Was supplemented. Po intake reported to be poor and  refusing supplements.             --recheck potassium level in am. 14. Thrombocytosis: Slowly trending down 15. Superficial thrombosis LUE: Local measures with elevation for edema management/ heat prn.          Jacquelynn Cree, PA-C 12/07/2020   I have personally performed a face to face diagnostic evaluation of this patient and formulated the key components of the plan.  Additionally, I have personally reviewed laboratory data, imaging studies, as well as relevant notes and concur with the physician assistant's documentation above.  The patient's status has not changed from the original H&P.  Any changes in documentation from the acute care chart have been noted above.  Ranelle Oyster, MD, [place]

## 2020-12-07 NOTE — Progress Notes (Signed)
Physical Medicine and Rehabilitation Consult     Reason for Consult: Anoxic brain injury.  Referring Physician: Dr. Tonia Brooms.      HPI: Nathaniel Lynn is a 30 y.o. male with history of polysubstance abuse who was admitted on 11/10/20 after found unresponsive with AKI with rhabdomyolysis, had two episodes of seizure type activity with posturing while in ED at [facility] therefore transferred to [facility] for management/LT-EEG. Per reports, patient was getting high with girlfriend and UDS positive for amphetamines, opiates and cannabinoids. He was started on broad spectrum antibiotics and loaded with Keppra. CT head negative. . 2 D echo showed EF 50-55% with no wall abnormality and trivial MVR/TVR. He continued to be unresponsive and developed fevers but attempts at LP not successful.  EEG negative for seizures and AEDs not needed per neurology. He was covered with ceftriaxone, acyclovir and Vanc due to concerns of meningitis/encephalitis.   MRI brain done revealing restricted diffusion in bilateral perirolandic area question due to hypoxic ischemic injury, uremia or toxic metabolic encephalopathy. He has had issues with severe agitation once off sedation and propofol resumed. LP by IR not indicative of infection and antibiotics d/c 05/20.   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 Melynda Ripple. 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 As mentation improving, neurology signed off and respiratory status stable since extubation. He continues to be limited by weakness, poor safety awareness requiring sitters and cognitive deficits. CIR recommended due to functional decline.      Review of Systems Constitutional:  Negative for chills and fever. HENT: Negative for congestion.   Eyes: Negative for discharge and redness. Respiratory: Negative for sputum production and shortness of breath.   Cardiovascular: Negative for chest pain. Gastrointestinal: Negative for heartburn, nausea and vomiting. Genitourinary:       Urinary incontinence with condom catheter  Musculoskeletal: Positive for myalgias. Skin: Negative for itching. Neurological: Positive for tremors and weakness. Endo/Heme/Allergies: Negative.   Psychiatric/Behavioral: Positive for memory loss and substance abuse.            Past Medical History:  Diagnosis Date   IVDU (intravenous drug user)        History reviewed. No pertinent surgical history.      History reviewed. No pertinent family history.      Social History:  has no history on file for tobacco use, alcohol use, and drug use.           Allergies  Allergen Reactions   Penicillins        Pt states he is allergic to penicillin. He said he was told that but he does not know what the reaction is.      No medications prior to admission.      Home: Home Living Family/patient expects to be discharged to:: Private residence Living Arrangements: Parent Available Help at Discharge: Family,Available 24 hours/day Type of Home: Mobile home Home Access: Stairs to enter Entrance Stairs-Number of Steps: 2 Home Layout: One level Bathroom Shower/Tub: Walk-in shower,Tub/shower unit Bathroom Toilet: Handicapped height Home Equipment: None Additional Comments: sister Baird Lyons provided home information.  Functional History: Prior Function Level of Independence: Independent Functional Status:  Mobility: Bed Mobility Overal bed mobility:  Needs Assistance Bed Mobility: Rolling,Supine to Sit Rolling: Supervision Supine to sit: Mod assist,+2 for physical assistance,+2 for safety/equipment General bed mobility comments: Pt able to progress to EOB with supervision but immediately  with LOB at EOB. pt requires mod A to correct LOB. pt using BIL UE to stabilize at EOB Transfers Overall transfer level: Needs assistance Equipment used: Rolling walker (2 wheeled) Transfers: Sit to/from Stand Sit to Stand: Mod assist,+2 safety/equipment,+2 physical assistance Squat pivot transfers: Max assist,+2 physical assistance  Lateral/Scoot Transfers: Max assist,+2 physical assistance,+2 safety/equipment General transfer comment: pt requires cues for R LE positioning posteriorly. Verbal/tactile cue to push knee backward, pt with tactile input at head for neck extension. Pt progressing with session with increased ability to engage muscles for increased postural alignment Ambulation/Gait Ambulation/Gait assistance: Mod assist,+2 physical assistance Gait Distance (Feet): 3 Feet Assistive device: Rolling walker (2 wheeled) Gait Pattern/deviations: Step-to pattern,Decreased dorsiflexion - right,Decreased weight shift to right,Decreased stance time - right General Gait Details: Pt taking pivotal steps from bed to chair, max multimodal cues for sequencing/direction, limited weightbearing through RLE Gait velocity: decreased Gait velocity interpretation: <1.31 ft/sec, indicative of household ambulator   ADL: ADL Overall ADL's : Needs assistance/impaired Eating/Feeding: Minimal assistance,Sitting Grooming: Sitting,Min guard Grooming Details (indicate cue type and reason): oral secretions without awarenes Toileting - Clothing Manipulation Details (indicate cue type and reason): incontinent bowel and bladder in bed, total assist Functional mobility during ADLs: Moderate assistance,+2 for physical assistance,+2 for safety/equipment,Rolling walker General ADL Comments: pt decr awareness of deficits and decr proprioception of R side. pt static standing total +2 min (A)   Cognition: Cognition Overall Cognitive Status: Impaired/Different from baseline Orientation Level: Oriented to  person,Oriented to place,Disoriented to time,Disoriented to situation Cognition Arousal/Alertness: Awake/alert Behavior During Therapy: Flat affect Overall Cognitive Status: Impaired/Different from baseline Area of Impairment: Orientation,Attention,Memory,Following commands,Safety/judgement,Awareness,Problem solving Orientation Level: Disoriented to,Time,Situation Current Attention Level: Sustained Memory: Decreased recall of precautions,Decreased short-term memory Following Commands: Follows one step commands consistently,Follows multi-step commands with increased time Safety/Judgement: Decreased awareness of deficits Awareness: Intellectual Problem Solving: Slow processing,Decreased initiation,Difficulty sequencing,Requires verbal cues,Requires tactile cues General Comments: Pt impulsive at times with movement and needs cues to decrease speed or correct posture. Pt emotionally labile mulitple times during session states "y'all dont understand"   Blood pressure 127/77, pulse 86, temperature 98 F (36.7 C), temperature source Oral, resp. rate 16, weight 84.1 kg, SpO2 93 %. Physical Exam Constitutional:      Appearance: He is normal weight.  HENT:    Head: Normocephalic and atraumatic.     Mouth/Throat:     Mouth: Mucous membranes are moist.  Eyes:    Extraocular Movements: Extraocular movements intact.     Conjunctiva/sclera: Conjunctivae normal.     Pupils: Pupils are equal, round, and reactive to light.  Cardiovascular:    Rate and Rhythm: Normal rate and regular rhythm.     Heart sounds: Normal heart sounds.  Pulmonary:    Effort: No respiratory distress.     Breath sounds: Normal breath sounds. No stridor.  Abdominal:     General: Abdomen is flat. Bowel sounds are normal.     Palpations: Abdomen is soft. Musculoskeletal:        General: No tenderness.     Right lower leg: No edema.     Left lower leg: No edema. Skin:    General: Skin is warm and dry.  Neurological:     Mental Status: He is alert. He is disoriented.     Motor:  Tremor and abnormal muscle tone present.     Coordination: Coordination abnormal.     Gait: Gait abnormal.     Comments: Motor strength is 4 - bilateral deltoid bicep tricep grip, does not participate with lower extremity manual muscle testing.  He does briefly extend his left lower extremity  Psychiatric:        Mood and Affect: Mood normal.       Lab Results Last 24 Hours       Results for orders placed or performed during the hospital encounter of 11/10/20 (from the past 24 hour(s))  Glucose, capillary     Status: None    Collection Time: 11/25/20 11:49 AM  Result Value Ref Range    Glucose-Capillary 94 70 - 99 mg/dL  Glucose, capillary     Status: None    Collection Time: 11/25/20  3:43 PM  Result Value Ref Range    Glucose-Capillary 96 70 - 99 mg/dL    Comment 1 Notify RN      Comment 2 Document in Chart    Glucose, capillary     Status: Abnormal    Collection Time: 11/25/20  8:03 PM  Result Value Ref Range    Glucose-Capillary 156 (H) 70 - 99 mg/dL    Comment 1 Notify RN      Comment 2 Document in Chart    Glucose, capillary     Status: Abnormal    Collection Time: 11/25/20 11:36 PM  Result Value Ref Range    Glucose-Capillary 126 (H) 70 - 99 mg/dL    Comment 1 Notify RN      Comment 2 Document in Chart    Glucose, capillary     Status: Abnormal    Collection Time: 11/26/20  3:55 AM  Result Value Ref Range    Glucose-Capillary 127 (H) 70 - 99 mg/dL    Comment 1 Notify RN      Comment 2 Document in Chart    Basic metabolic panel     Status: Abnormal    Collection Time: 11/26/20  4:44 AM  Result Value Ref Range    Sodium 137 135 - 145 mmol/L    Potassium 3.7 3.5 - 5.1 mmol/L    Chloride 104 98 - 111 mmol/L    CO2 25 22 - 32 mmol/L    Glucose, Bld 138 (H) 70 - 99 mg/dL    BUN 30 (H) 6 - 20 mg/dL    Creatinine, Ser 3.87 (H) 0.61 - 1.24 mg/dL    Calcium 9.2 8.9 - 56.4 mg/dL    GFR, Estimated >33 >29 mL/min     Anion gap 8 5 - 15  CBC     Status: Abnormal    Collection Time: 11/26/20  4:44 AM  Result Value Ref Range    WBC 6.7 4.0 - 10.5 K/uL    RBC 3.19 (L) 4.22 - 5.81 MIL/uL    Hemoglobin 9.7 (L) 13.0 - 17.0 g/dL    HCT 51.8 (L) 84.1 - 52.0 %    MCV 91.8 80.0 - 100.0 fL    MCH 30.4 26.0 - 34.0 pg    MCHC 33.1 30.0 - 36.0 g/dL    RDW 66.0 63.0 - 16.0 %    Platelets 499 (H) 150 - 400 K/uL    nRBC 0.0 0.0 - 0.2 %      Imaging Results (Last 48 hours)  No results found.     Assessment/Plan: Diagnosis: Anoxic encephalopathy with cognitive deficits mobility and  self-care impairments Does the need for close, 24 hr/day medical supervision in concert with the patient's rehab needs make it unreasonable for this patient to be served in a less intensive setting? Yes Co-Morbidities requiring supervision/potential complications: Ataxia, history of polysubstance abuse, acute kidney injury with rhabdomyolysis Due to bladder management, bowel management, safety, skin/wound care, disease management, medication administration, pain management and patient education, does the patient require 24 hr/day rehab nursing? Yes Does the patient require coordinated care of a physician, rehab nurse, therapy disciplines of PT, OT, speech to address physical and functional deficits in the context of the above medical diagnosis(es)? Yes Addressing deficits in the following areas: balance, endurance, locomotion, strength, transferring, bowel/bladder control, bathing, dressing, feeding, grooming, toileting and psychosocial support Can the patient actively participate in an intensive therapy program of at least 3 hrs of therapy per day at least 5 days per week? Yes The potential for patient to make measurable gains while on inpatient rehab is good Anticipated functional outcomes upon discharge from inpatient rehab are supervision  with PT, supervision with OT, supervision with SLP. Estimated rehab length of stay to reach the  above functional goals is: 18 to 21 days Anticipated discharge destination: Home Overall Rehab/Functional Prognosis: good   RECOMMENDATIONS: This patient's condition is appropriate for continued rehabilitative care in the following setting: CIR Patient has agreed to participate in recommended program. Yes Note that insurance prior authorization may be required for reimbursement for recommended care.   Comment:      Jacquelynn Creeamela S Love, PA-C 11/26/2020    "I have personally performed a face to face diagnostic evaluation of this patient.  Additionally, I have reviewed and concur with the physician assistant's documentation above." Erick ColaceAndrew E. Kirsteins M.D. [facility] Fellow Am Acad of Phys Med and Rehab Diplomate Am Board of Electrodiagnostic Med Fellow Am Board of Interventional Pain

## 2020-12-07 NOTE — Progress Notes (Signed)
Inpatient Rehabilitation Care Coordinator Assessment and Plan Patient Details  Name: Nathaniel Lynn MRN: [id number] Date of Birth: Dec 05, 1990  Today's Date: 12/07/2020  Hospital Problems: Principal Problem:   Anoxic brain injury [facility])  Past Medical History:  Past Medical History:  Diagnosis Date   IVDU (intravenous drug user)    Past Surgical History: History reviewed. No pertinent surgical history. Social History:  reports that he has an unknown smoking status. He has never used smokeless tobacco. He reports current alcohol use. He reports previous drug use.  Family / Support Systems Marital Status: Single Patient Roles: Parent Spouse/Significant Other: Single Children: 3 children and they live with their mother Other Supports: father Anticipated Caregiver: mother/father Ability/Limitations of Caregiver: none reported Caregiver Availability: 24/7 Family Dynamics: Pt lives with his parents.  Social History Preferred language: English Religion: None Cultural Background: Pt works as a Theme park manager. Education: 8th grade Read: Yes Write: Yes Employment Status: Employed Name of Employer: Administrator, sports of Employment:  (11 years) Return to Work Plans: TBD Public relations account executive Issues: Denies Guardian/Conservator: N/A   Abuse/Neglect Abuse/Neglect Assessment Can Be Completed: Yes Physical Abuse: Denies Verbal Abuse: Denies Sexual Abuse: Denies Exploitation of patient/patient's resources: Denies Self-Neglect: Denies  Emotional Status Pt's affect, behavior and adjustment status: Pt appeared to be slightly agitated as he was asking for his Lyrica. Despite his pain, pt was pleasant. Recent Psychosocial Issues: Pt denies Psychiatric History: Denies Substance Abuse History: pt admits to smoking cigarettes daily- 1.5 pk. PT denies [facility] and rec drug use. Per EMR, pt UDS postiive for ampthetamines, opiates, and cannibus.  Patient / Family Perceptions,  Expectations & Goals Pt/Family understanding of illness & functional limitations: Pt and family ahve a general understanding of care needs Premorbid pt/family roles/activities: Independent Anticipated changes in roles/activities/participation: Some assistance with ADLs/IADLs Pt/family expectations/goals: Pt goal: "getting my health back together and getting outside"  [employer]: None Premorbid Home Care/DME Agencies: None Transportation available at discharge: Mother Resource referrals recommended: Neuropsychology  Discharge Planning Living Arrangements: Parent Support Systems: Parent Type of Residence: Private residence Insurance Resources: Teacher, adult education Resources: Employment, Family Support Financial Screen Referred: No Living Expenses: Mortgage Money Management: Family, Patient Does the patient have any problems obtaining your medications?: No Home Management: All family members help manage home care needs Patient/Family Preliminary Plans: No changes Care Coordinator Barriers to Discharge: Decreased caregiver support, Lack of/limited family support Care Coordinator Anticipated Follow Up Needs: HH/OP  Clinical Impression SW met with pt in room at bedside to introduce self, explain role, and discuss discharge process. Pt is not a English as a second language teacher. No HCPOA. No DME. SW to provide pt with resources for PCP. Pt aware SW to follow-up with his mother.   [place] spoke with pt mother Nathaniel Lynn [phone]) to introduce self, explain role, and discuss discharge process. Confirms that pt will be able to return to her home. She states she has completed and submitted Medicaid application on his behalf.   SW provided packet of information for PCP resources.   Rana Snare 12/07/2020, 4:51 PM

## 2020-12-07 NOTE — Progress Notes (Signed)
INPATIENT REHABILITATION ADMISSION NOTE   Arrival Method: wheelchair      Mental Orientation: alert and orientedx  3   Assessment: done    Skin: intact    IV'S:    Pain: right foot pain 10/10    Tubes and Drains: none    Safety Measures: telesitter, floormats   Vital Signs: done    Height and Weight: 6'0" 73.9 kg    Rehab Orientation: oriented to unit and rehab schedule    Family: sister at bedside     Notes:

## 2020-12-07 NOTE — Progress Notes (Signed)
Inpatient Rehabilitation Medication Review by a Pharmacist  A complete drug regimen review was completed for this patient to identify any potential clinically significant medication issues.  Clinically significant medication issues were identified:  yes   Type of Medication Issue Identified Description of Issue Urgent (address now) Non-Urgent (address on AM team rounds) Plan Plan Accepted by Provider? (Yes / No / Pending AM Rounds)  Drug Interaction(s) (clinically significant)       Duplicate Therapy       Allergy       No Medication Administration End Date       Incorrect Dose       Additional Drug Therapy Needed  Dr Riley Kill note mentions "Will add juven for supplement (tastes better than prosource)             --Will add vitamin C and Zinc." But dont see these currently ordered  Not urgent  Follow up at CIR rounds  Pending AM rounds  Other         Name of provider notified for urgent issues identified:   Provider Method of Notification:    For non-urgent medication issues to be resolved on team rounds tomorrow morning a [facility] Secure Chat Handoff was sent to:    Pharmacist comments:   Time spent performing this drug regimen review (minutes):  15   Philmore Lepore 12/07/2020 5:53 PM

## 2020-12-08 ENCOUNTER — Inpatient Hospital Stay (HOSPITAL_COMMUNITY): Payer: Self-pay

## 2020-12-08 LAB — COMPREHENSIVE METABOLIC PANEL
ALT: 15 U/L (ref 0–44)
AST: 19 U/L (ref 15–41)
Albumin: 3.9 g/dL (ref 3.5–5.0)
Alkaline Phosphatase: 53 U/L (ref 38–126)
Anion gap: 10 (ref 5–15)
BUN: 7 mg/dL (ref 6–20)
CO2: 28 mmol/L (ref 22–32)
Calcium: 9.4 mg/dL (ref 8.9–10.3)
Chloride: 97 mmol/L — ABNORMAL LOW (ref 98–111)
Creatinine, Ser: 0.91 mg/dL (ref 0.61–1.24)
GFR, Estimated: >60 mL/min (ref >60)
Glucose, Bld: 120 mg/dL — ABNORMAL HIGH (ref 70–99)
Potassium: 3.5 mmol/L (ref 3.5–5.1)
Sodium: 135 mmol/L (ref 135–145)
Total Bilirubin: 0.3 mg/dL (ref 0.3–1.2)
Total Protein: 7.1 g/dL (ref 6.5–8.1)

## 2020-12-08 LAB — CBC WITH DIFFERENTIAL/PLATELET
Abs Immature Granulocytes: 0.02 10*3/uL (ref 0.00–0.07)
Basophils Absolute: 0 10*3/uL (ref 0.0–0.1)
Basophils Relative: 0 %
Eosinophils Absolute: 0.2 10*3/uL (ref 0.0–0.5)
Eosinophils Relative: 3 %
HCT: 34.3 % — ABNORMAL LOW (ref 39.0–52.0)
Hemoglobin: 12 g/dL — ABNORMAL LOW (ref 13.0–17.0)
Immature Granulocytes: 0 %
Lymphocytes Relative: 33 %
Lymphs Abs: 2.1 10*3/uL (ref 0.7–4.0)
MCH: 30.5 pg (ref 26.0–34.0)
MCHC: 35 g/dL (ref 30.0–36.0)
MCV: 87.1 fL (ref 80.0–100.0)
Monocytes Absolute: 0.7 10*3/uL (ref 0.1–1.0)
Monocytes Relative: 12 %
Neutro Abs: 3.3 10*3/uL (ref 1.7–7.7)
Neutrophils Relative %: 52 %
Platelets: 419 10*3/uL — ABNORMAL HIGH (ref 150–400)
RBC: 3.94 MIL/uL — ABNORMAL LOW (ref 4.22–5.81)
RDW: 12.1 % (ref 11.5–15.5)
WBC: 6.3 10*3/uL (ref 4.0–10.5)
nRBC: 0 % (ref 0.0–0.2)

## 2020-12-08 NOTE — Evaluation (Signed)
Occupational Therapy Assessment and Plan  Patient Details  Name: Nathaniel Lynn MRN: [id number] Date of Birth: 1990-10-05  OT Diagnosis: cognitive deficits and muscle weakness (generalized) Rehab Potential: Rehab Potential (ACUTE ONLY): Excellent ELOS: 10-12 days   Today's Date: 12/08/2020 OT Individual Time: 1655-3748 OT Individual Time Calculation (min): 60 min     Hospital Problem: Principal Problem:   Anoxic brain injury [facility])  Past Medical History:  Past Medical History:  Diagnosis Date   IVDU (intravenous drug user)    Past Surgical History: History reviewed. No pertinent surgical history.  Assessment & Plan Clinical Impression: Patient is a 30 y.o. year old male with recent admission to the hospital on 11/10/20 after being found down reportedly "getting high" at girlfriend's house; (+) UDS (amphetamines, opiates, cannabinoids). Head CT negative for acute pathology. Pt with tonic-clonic seizure-like activity in ED, intubated with transfer to [facility] ICU. Unremarkable LP 5/19. EEG demonstrated nonspecific moderate diffuse encephalopathy without epileptiform discharges. 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 ETT 5/14-5/25. Course complicated by intermittent agitation. Other workup for AKI, rhabdomyolysis, HTN. PMH includes substance abuse.  Patient transferred to CIR on 12/07/2020 .    Patient currently requires min/ mod with basic self-care skills secondary to muscle weakness, decreased awareness, decreased problem solving, decreased safety awareness, and decreased memory, and decreased sitting balance, decreased standing balance, decreased postural control, and decreased balance strategies.  Prior to hospitalization, patient could complete BADL with independent .  Patient will benefit from skilled intervention to increase independence with basic self-care skills prior to discharge home with care partner.  Anticipate patient  will require 24 hour supervision and follow up outpatient.  OT - End of Session Endurance Deficit: Yes Endurance Deficit Description: Occasional rest breaks within BADL tasks OT Assessment Rehab Potential (ACUTE ONLY): Excellent OT Patient demonstrates impairments in the following area(s): Balance;Behavior;Cognition;Endurance;Motor;Pain;Safety;Sensory OT Basic ADL's Functional Problem(s): Grooming;Bathing;Dressing;Toileting;Eating OT Transfers Functional Problem(s): Toilet;Tub/Shower OT Additional Impairment(s): Fuctional Use of Upper Extremity OT Plan OT Intensity: Minimum of 1-2 x/day, 45 to 90 minutes OT Frequency: 5 out of 7 days OT Duration/Estimated Length of Stay: 10-12 days OT Treatment/Interventions: Balance/vestibular training;Cognitive remediation/compensation;Community reintegration;Discharge planning;Disease mangement/prevention;DME/adaptive equipment instruction;Functional mobility training;Neuromuscular re-education;Pain management;Patient/family education;Functional electrical stimulation;Psychosocial support;Self Care/advanced ADL retraining;Splinting/orthotics;Skin care/wound managment;Therapeutic Activities;Therapeutic Exercise;UE/LE Strength taining/ROM;Visual/perceptual remediation/compensation;Wheelchair propulsion/positioning;UE/LE Coordination activities OT Self Feeding Anticipated Outcome(s): Independent OT Basic Self-Care Anticipated Outcome(s): Mod I/supervision OT Toileting Anticipated Outcome(s): Supervision OT Bathroom Transfers Anticipated Outcome(s): Supervision OT Recommendation Recommendations for Other Services: Neuropsych consult;Therapeutic Recreation consult Therapeutic Recreation Interventions: Stress management;Outing/community reintergration Patient destination: Home Follow Up Recommendations: Outpatient OT;Home health OT (vs) Equipment Recommended: Tub/shower seat;Rolling walker with 5" wheels   OT Evaluation Precautions/Restrictions   Precautions Precautions: Fall Restrictions Weight Bearing Restrictions: No Pain Pain Assessment Pain Scale:8/10 pain in R LE when ambulating, Burning, pins and needles sensation. Rest and repositioned for comfort.  Home Living/Prior Functioning Home Living Family/patient expects to be discharged to:: Private residence Living Arrangements: Parent Available Help at Discharge: Family, Available 24 hours/day Type of Home: Mobile home Home Access: Stairs to enter [employer] of Steps: 2 Home Layout: One level Bathroom Shower/Tub: Gaffer, Chiropodist: Handicapped height Additional Comments: Pt reports his family already got him a Therapist, nutritional, need to confirm  Lives With: Family IADL History Current License: Yes (Says he does not have a license, but does drive) Type of Occupation: Roofer Prior Function Level of Independence: Independent with homemaking with ambulation, Independent with  basic ADLs Vision Vision Assessment?: No apparent visual deficits Perception  Perception: Within Functional Limits Praxis Praxis: Intact Cognition Overall Cognitive Status: Impaired/Different from baseline Arousal/Alertness: Awake/alert Orientation Level: Person;Place;Situation Person: Oriented Place: Oriented Situation: Oriented Year: Other (Comment) ("1990 something") Month: May Day of Week: Incorrect Memory: Impaired Memory Impairment: Storage deficit;Retrieval deficit;Decreased recall of new information Immediate Memory Recall: Blue;Sock;Bed Memory Recall Sock: With Cue Memory Recall Blue: Without Cue Memory Recall Bed: Not able to recall Attention: Sustained Awareness: Impaired Problem Solving: Impaired Safety/Judgment: Impaired Comments: poor safety awareness Sensation Sensation Light Touch: Impaired Detail Light Touch Impaired Details: Impaired RLE (hypersensitive) Coordination Gross Motor Movements are Fluid and Coordinated: No Fine  Motor Movements are Fluid and Coordinated: No Coordination and Movement Description: mild strength an dcoordination deficits on R UE, R LE with more motor deficits than UE Motor  Motor Motor - Skilled Clinical Observations: Painful weight bearing and hypersensitive R LE, foot drop on R LE as well  Balance Balance Balance Assessed: Yes Static Sitting Balance Static Sitting - Balance Support: Feet supported Static Sitting - Level of Assistance: 5: Stand by assistance Dynamic Sitting Balance Dynamic Sitting - Balance Support: Feet supported;During functional activity Dynamic Sitting - Level of Assistance: 5: Stand by assistance Static Standing Balance Static Standing - Balance Support: During functional activity Static Standing - Level of Assistance: 4: Min assist Dynamic Standing Balance Dynamic Standing - Balance Support: During functional activity Dynamic Standing - Level of Assistance: 4: Min assist Extremity/Trunk Assessment RUE Assessment RUE Assessment: Exceptions to [facility] General Strength Comments: mild weakness 4/5 in R shoulder, generally functional LUE Assessment LUE Assessment: Within Functional Limits  Care Tool Care Tool Self Care Eating   Eating Assist Level: Minimal Assistance - Patient > 75%    Oral Care    Oral Care Assist Level: Minimal Assistance - Patient > 75%    Bathing   Body parts bathed by patient: Right arm;Left arm;Chest;Abdomen;Front perineal area;Buttocks;Right upper leg;Left upper leg;Face Body parts bathed by helper: Right lower leg   Assist Level: Minimal Assistance - Patient > 75%    Upper Body Dressing(including orthotics)   What is the patient wearing?: Pull over shirt   Assist Level: Minimal Assistance - Patient > 75%    Lower Body Dressing (excluding footwear)   What is the patient wearing?: Pants Assist for lower body dressing: Moderate Assistance - Patient 50 - 74%    Putting on/Taking off footwear   What is the patient wearing?:  Non-skid slipper socks Assist for footwear: Maximal Assistance - Patient 25 - 49%       Care Tool Toileting Toileting activity   Assist for toileting: Moderate Assistance - Patient 50 - 74%     Care Tool Bed Mobility Roll left and right activity   Roll left and right assist level: Supervision/Verbal cueing    Sit to lying activity   Sit to lying assist level: Supervision/Verbal cueing    Lying to sitting edge of bed activity         Care Tool Transfers Sit to stand transfer   Sit to stand assist level: Minimal Assistance - Patient > 75%    Chair/bed transfer   Chair/bed transfer assist level: Minimal Assistance - Patient > 75%     Toilet transfer   Assist Level: Minimal Assistance - Patient > 75%     Care Tool Cognition Expression of Ideas and Wants Expression of Ideas and Wants: Some difficulty - exhibits some difficulty with expressing needs and ideas (e.g,  some words or finishing thoughts) or speech is not clear   Understanding Verbal and Non-Verbal Content Understanding Verbal and Non-Verbal Content: Usually understands - understands most conversations, but misses some part/intent of message. Requires cues at times to understand   Memory/Recall Ability *first 3 days only Memory/Recall Ability *first 3 days only: Current season;That he or she is in a hospital/hospital unit    Refer to Care Plan for [place] 1 OT Short Term Goal 1 (Week 1): Patient will complete toilet transfer with CGA. OT Short Term Goal 2 (Week 1): Pt will don socks with min A OT Short Term Goal 3 (Week 1): Pt tolerate standing at the sink with B LEs evenly on the floor for 1 minute within grooming task  Recommendations for other services: Neuropsych and Therapeutic Recreation  Stress management and Outing/community reintegration   Skilled Therapeutic Intervention Pt greeted semi-reclined in bed and agreeable to OT eval and treatment focused on self-care retraining.  OT eval completed addressing rehab process, OT purpose, POC, ELOS, and goals. Pt bed mobility with supervision. Attempted sitL<>stand at EOB with RW and min A, but pt had difficulty tolerating weight bearing through R LE. OT brought over RW and pt able to ambulate into bathroom w/ RW and min A and limited weight bearing through R LE. Showering completed from shower chair with min A to wash lower extremities. Pt did tolerate standing to shower to wash buttocks and peri-area without as much pain in R LE. Pt ambulated out of bathroom in similar fashion and completed dressing from EOB. Pt with difficulty donning socks and had increased pain in R LE 2/2 hypersensitivity when clothing touches foot. Pt pleasant, following commands well, and did well sequencing and initiating within familiar functional BADL task. Pt did demonstrate memory deficits at times with decreased recall of new information and poor overall safety awareness. Pt returned to bed at end of session and left semi-reclined in bed with bed alarm on, call bell in reach, and needs met. See below for further details regarding BADL performance.  ADL ADL Eating: Set up Grooming: Minimal assistance Upper Body Bathing: Minimal assistance Lower Body Bathing: Moderate assistance Upper Body Dressing: Minimal assistance Lower Body Dressing: Moderate assistance Toileting: Moderate assistance Toilet Transfer: Minimal assistance Social research officer, government: Minimal assistance Mobility  Bed Mobility Bed Mobility: Supine to Sit;Sit to Supine Supine to Sit: Supervision/Verbal cueing Sit to Supine: Supervision/Verbal cueing Transfers Sit to Stand: Minimal Assistance - Patient > 75% Stand to Sit: Minimal Assistance - Patient > 75%   Discharge Criteria: Patient will be discharged from OT if patient refuses treatment 3 consecutive times without medical reason, if treatment goals not met, if there is a change in medical status, if patient makes no progress  towards goals or if patient is discharged from hospital.  The above assessment, treatment plan, treatment alternatives and goals were discussed and mutually agreed upon: by patient  Valma Cava 12/08/2020, 12:05 PM

## 2020-12-08 NOTE — Plan of Care (Signed)
Problem: RH Balance Goal: LTG: Patient will maintain dynamic sitting balance (OT) Description: LTG:  Patient will maintain dynamic sitting balance with assistance during activities of daily living (OT) Flowsheets (Taken 12/08/2020 1154) LTG: Pt will maintain dynamic sitting balance during ADLs with: Independent Goal: LTG Patient will maintain dynamic standing with ADLs (OT) Description: LTG:  Patient will maintain dynamic standing balance with assist during activities of daily living (OT)  Flowsheets (Taken 12/08/2020 1154) LTG: Pt will maintain dynamic standing balance during ADLs with: Supervision/Verbal cueing   Problem: Sit to Stand Goal: LTG:  Patient will perform sit to stand in prep for activites of daily living with assistance level (OT) Description: LTG:  Patient will perform sit to stand in prep for activites of daily living with assistance level (OT) Flowsheets (Taken 12/08/2020 1154) LTG: PT will perform sit to stand in prep for activites of daily living with assistance level: Supervision/Verbal cueing   Problem: RH Eating Goal: LTG Patient will perform eating w/assist, cues/equip (OT) Description: LTG: Patient will perform eating with assist, with/without cues using equipment (OT) Flowsheets (Taken 12/08/2020 1154) LTG: Pt will perform eating with assistance level of: Independent   Problem: RH Grooming Goal: LTG Patient will perform grooming w/assist,cues/equip (OT) Description: LTG: Patient will perform grooming with assist, with/without cues using equipment (OT) Flowsheets (Taken 12/08/2020 1154) LTG: Pt will perform grooming with assistance level of: Independent   Problem: RH Bathing Goal: LTG Patient will bathe all body parts with assist levels (OT) Description: LTG: Patient will bathe all body parts with assist levels (OT) Flowsheets (Taken 12/08/2020 1154) LTG: Pt will perform bathing with assistance level/cueing: Supervision/Verbal cueing   Problem: RH  Dressing Goal: LTG Patient will perform upper body dressing (OT) Description: LTG Patient will perform upper body dressing with assist, with/without cues (OT). Flowsheets (Taken 12/08/2020 1154) LTG: Pt will perform upper body dressing with assistance level of: Independent Goal: LTG Patient will perform lower body dressing w/assist (OT) Description: LTG: Patient will perform lower body dressing with assist, with/without cues in positioning using equipment (OT) Flowsheets (Taken 12/08/2020 1154) LTG: Pt will perform lower body dressing with assistance level of: Supervision/Verbal cueing   Problem: RH Toileting Goal: LTG Patient will perform toileting task (3/3 steps) with assistance level (OT) Description: LTG: Patient will perform toileting task (3/3 steps) with assistance level (OT)  Flowsheets (Taken 12/08/2020 1154) LTG: Pt will perform toileting task (3/3 steps) with assistance level: Supervision/Verbal cueing   Problem: RH Functional Use of Upper Extremity Goal: LTG Patient will use RT/LT upper extremity as a (OT) Description: LTG: Patient will use right/left upper extremity as a stabilizer/gross assist/diminished/nondominant/dominant level with assist, with/without cues during functional activity (OT) Flowsheets (Taken 12/08/2020 1154) LTG: Use of upper extremity in functional activities: RUE as dominant level   Problem: RH Toilet Transfers Goal: LTG Patient will perform toilet transfers w/assist (OT) Description: LTG: Patient will perform toilet transfers with assist, with/without cues using equipment (OT) Flowsheets (Taken 12/08/2020 1154) LTG: Pt will perform toilet transfers with assistance level of: Supervision/Verbal cueing   Problem: RH Tub/Shower Transfers Goal: LTG Patient will perform tub/shower transfers w/assist (OT) Description: LTG: Patient will perform tub/shower transfers with assist, with/without cues using equipment (OT) Flowsheets (Taken 12/08/2020 1154) LTG: Pt  will perform tub/shower stall transfers with assistance level of: Supervision/Verbal cueing   Problem: RH Awareness Goal: LTG: Patient will demonstrate awareness during functional activites type of (OT) Description: LTG: Patient will demonstrate awareness during functional activites type of (OT) Flowsheets (Taken 12/08/2020 1154)  LTG: Patient will demonstrate awareness during functional activites type of (OT): Supervision

## 2020-12-08 NOTE — Evaluation (Signed)
Physical Therapy Assessment and Plan  Patient Details  Name: Nathaniel Lynn MRN: [id number] Date of Birth: June 22, 1991  PT Diagnosis: Abnormality of gait, Difficulty walking, Hemiparesis dominant, Muscle weakness, and Pain in R foot Rehab Potential: Good ELOS: 10-12 days   Today's Date: 12/08/2020 PT Individual Time: 1300-1400 PT Individual Time Calculation (min): 60 min    Hospital Problem: Principal Problem:   Anoxic brain injury [facility])   Past Medical History:  Past Medical History:  Diagnosis Date   IVDU (intravenous drug user)    Past Surgical History: History reviewed. No pertinent surgical history.  Assessment & Plan Clinical Impression:  Nathaniel Lynn is a 30 year old male with history of polysubstance abuse who was admitted to [facility] on 11/10/20 after being found unresponsive.  His girlfriend reported that they were getting high and he became unresponsive but did not respond to Narcan.  He was found to have acute kidney injury with rhabdomyolysis, tachycardia with dilated pupils and posturing with seizure type activity.  CT head negative for acute changes.  CK> 50,000 and UDS positive for amphetamine, opiates.  He was started on IV Keppra as well as IV fluids for hydration.  Elevated troponins noted and felt to be due to demand anemia.  He was intubated in ED and transferred to [facility] for LT-EEG monitoring.  He was started on fentanyl, thiamine and propofol and monitored for withdrawal.  2D echo done showing EF 50 to 55% with no wall abnormality and trivial MVR/TVR.  EEG was negative for seizures and AEDs not needed per neurology input.  He was initially covered with vancomycin, ceftriaxone and acyclovir due to concerns of meningitis/encephalitis.  MRI brain done revealing restricted diffusion bilateral perirolandic area question due to hypoxic ischemic injury, uremia toxic metabolic encephalopathy.  LP by IR was not indicated of infection and antibiotics were DC'd on 05/20.     He had  issues with severe agitation with weaning of sedation-propofol was resumed.  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  No concurrent EEG changes were noted during these episodes and but not felt to be epileptic in nature per Dr. Hortense Ramal.  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  He tolerated extubation his mentation was improving neurology and off.  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  Acute renal failure has resolved and hypokalemia being supplemented intermittently.  Acute Blood loss anemia is improving and abnormal LFTs have resolved.  He continues to have issues with higher level cognitive deficits, issues with problem solving as well as right sided weakness with  worsening of RLE pain in the past 2 days. CIR recommended due to functional decline.  Patient currently requires min with mobility secondary to muscle weakness and muscle paralysis and decreased coordination.  Prior to hospitalization, patient was independent  with mobility and lived with Family in a Mobile home home.  Home access is 2Stairs to enter.  Patient will benefit from skilled PT intervention to maximize safe functional mobility, minimize fall risk, and decrease caregiver burden for planned discharge home with 24 hour supervision.  Anticipate patient will benefit from follow up [place] at discharge.  PT - End of Session Activity Tolerance: Tolerates 30+ min activity  with multiple rests Endurance Deficit: Yes Endurance Deficit Description: mostly limited by RLE pain. PT Assessment Rehab Potential (ACUTE/IP ONLY): Good PT Barriers to Discharge: Inaccessible home environment PT Barriers to Discharge Comments: 2  STE, possible R foot pathology PT Patient demonstrates impairments in the following area(s): Balance;Pain;Safety;Endurance;Motor PT Transfers Functional Problem(s): Bed Mobility;Bed to Chair;Car;Furniture PT Locomotion Functional Problem(s): Ambulation;Wheelchair Mobility;Other (comment) (stairs) PT Plan PT Intensity: Minimum of 1-2 x/day ,45 to 90 minutes PT Frequency: 5 out of 7 days PT Duration Estimated Length of Stay: 10-12 days PT Treatment/Interventions: Ambulation/gait training;Discharge planning;Functional mobility training;Therapeutic Activities;UE/LE Strength taining/ROM;Balance/vestibular training;Community reintegration;Neuromuscular re-education;Patient/family education;Stair training;Therapeutic Exercise;UE/LE Coordination activities;Wheelchair propulsion/positioning PT Transfers Anticipated Outcome(s): Mod I PT Locomotion Anticipated Outcome(s): supervision w/ LRAD PT Recommendation Follow Up Recommendations: Home health PT Patient destination: Home Equipment Recommended: To be determined Equipment Details: Pt may have RW at home.   PT Evaluation Precautions/Restrictions Precautions Precautions: Fall Precaution Comments: R-side weakness and lower leg pain Restrictions Weight Bearing Restrictions: No Other Position/Activity Restrictions: Pt scheduled for CT scan of R foot to r/o pathology, attempted to maintain NWB but pt unable to maintain, but appears TDWB only. General Chart Reviewed: Yes Family/Caregiver Present: No Vital SignsTherapy Vitals Temp: 98.4 F (36.9 C) Temp Source: Oral Pulse Rate: 89 Resp: 18 BP: (!) 146/94 Patient Position (if appropriate): Lying Oxygen Therapy SpO2: 100 % O2 Device: Room Air Pain Pain Assessment Pain Scale: 0-10 Pain Score: 8  Pain Type: Acute pain Pain Location: Foot Pain Orientation: Right Pain Descriptors / Indicators: Aching Pain Frequency: Intermittent Pain Onset: On-going Patients Stated Pain Goal: 2 Pain  Intervention(s): Medication (See eMAR) Home Living/Prior Functioning Home Living Available Help at Discharge: Family;Available 24 hours/day Type of Home: Mobile home Home Access: Stairs to enter Entrance Stairs-Number of Steps: 2 Entrance Stairs-Rails: Right;Left Home Layout: One level Bathroom Shower/Tub: Tub/shower unit  Lives With: Family Prior Function Level of Independence: Independent with transfers;Independent with gait  Able to Take Stairs?: Yes Driving: Yes Vocation: Full time employment 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 Vision/Perception     Cognition Overall Cognitive Status: Impaired/Different from baseline Arousal/Alertness: Awake/alert Orientation Level: Oriented to person;Disoriented to place Attention: Sustained Sustained Attention: Impaired Memory: Impaired Memory Impairment: Storage deficit;Retrieval deficit;Decreased recall of new information Awareness: Impaired Awareness Impairment: Intellectual impairment Problem Solving: Impaired Problem Solving Impairment: Verbal basic;Functional basic Behaviors: Lability Safety/Judgment: Impaired Sensation Sensation Light Touch: Impaired Detail Light Touch Impaired Details: Impaired RLE Coordination Gross Motor Movements are Fluid and Coordinated: No Fine Motor Movements are Fluid and Coordinated: No Coordination and Movement Description: mild strength an dcoordination deficits on R UE, R LE with more motor deficits than UE Heel Shin Test: Pt refused performance of RLE, only able to bring LLE towards R and will not move on shin. Motor  Motor Motor - Skilled Clinical Observations: Painful weight bearing and hypersensitive R LE, foot drop on R LE as well   Trunk/Postural Assessment  Cervical Assessment Cervical Assessment: Within Functional Limits Thoracic Assessment Thoracic Assessment: Within Functional Limits Lumbar Assessment Lumbar Assessment: Within Functional Limits  Balance Balance Balance  Assessed: Yes Static Sitting Balance Static Sitting - Balance Support: Feet supported Static Sitting - Level of Assistance: 5: Stand by assistance Dynamic Sitting Balance Dynamic Sitting - Balance Support: Feet supported;During functional activity Dynamic Sitting - Level of Assistance: 5: Stand by assistance Static Standing Balance Static Standing - Balance Support: During functional activity Static Standing - Level of Assistance: 4: Min assist Dynamic Standing Balance Dynamic Standing - Balance Support: During  functional activity Dynamic Standing - Level of Assistance: 4: Min assist Extremity Assessment      RLE Assessment RLE Assessment: Exceptions to [facility] Passive Range of Motion (PROM) Comments: would not allow General Strength Comments: grossly 2+/5, except foot drop? LLE Assessment LLE Assessment: Within Functional Limits  Care Tool Care Tool Bed Mobility Roll left and right activity   Roll left and right assist level: Supervision/Verbal cueing    Sit to lying activity   Sit to lying assist level: Supervision/Verbal cueing    Lying to sitting edge of bed activity   Lying to sitting edge of bed assist level: Supervision/Verbal cueing     Care Tool Transfers Sit to stand transfer   Sit to stand assist level: Minimal Assistance - Patient > 75%    Chair/bed transfer   Chair/bed transfer assist level: Minimal Assistance - Patient > 75%     Physiological scientist transfer assist level: Minimal Assistance - Patient > 75%      Care Tool Locomotion Ambulation Ambulation activity did not occur: Safety/medical concerns (pt scheduled for CT scan R foot r/o pathology)        Walk 10 feet activity         Walk 50 feet with 2 turns activity        Walk 150 feet activity        Walk 10 feet on uneven surfaces activity Walk 10 feet on uneven surfaces activity did not occur: Safety/medical concerns      Stairs Stair activity did not occur:  Safety/medical concerns        Walk up/down 1 step activity          Walk up/down 4 steps activity      Walk up/down 12 steps activity        Pick up small objects from floor Pick up small object from the floor (from standing position) activity did not occur: Safety/medical concerns      Wheelchair Will patient use wheelchair at discharge?: No Type of Wheelchair: Manual   Wheelchair assist level: Moderate Assistance - Patient 50 - 74% Max wheelchair distance: 150  Wheel 50 feet with 2 turns activity   Assist Level: Moderate Assistance - Patient 50 - 74%  Wheel 150 feet activity   Assist Level: Moderate Assistance - Patient 50 - 74%    Refer to Care Plan for Long Term Goals  SHORT TERM GOAL WEEK 1 PT Short Term Goal 1 (Week 1): Pt will transfer sit to stand w/ CGA w/ LRAD. PT Short Term Goal 2 (Week 1): Pt will transfer bed <> w/c w/ CGA. PT Short Term Goal 3 (Week 1): Pt will amb 40' w/ LRAD and CGA.  Recommendations for other services: None   Skilled Therapeutic Intervention Evaluation completed (see details above and below) with education on PT POC and goals and individual treatment initiated with focus on transfers, strengthening, gait and safety.  Pt presents supine in bed and c/o pain to R foot.  Pt requested pain meds and given by RN.  Pt transfers sup to sit w/ supervision and requires verbal cueing for safety aspeed (transfers to EOB wrapped in sheet still).  Pt transfers from all surfaces w/ min A, and as pt is scheduled for CT R foot, attempted to maintain NWB R foot, but more likely TDWB.  Pt wheeled self in w/c w/ mod A to maintain straight line in hallways up  to 150'.  Pt tends to veer to left.  Pt transferred into car w/ min A. Pt familiarized w/ therapy gyms and returned to room and amb 2-3" BTB w/ min A and RW.  Pt then returned to w/c for transport to CT.     Mobility Bed Mobility Bed Mobility: Supine to Sit;Sit to Supine Supine to Sit: Supervision/Verbal  cueing Sit to Supine: Supervision/Verbal cueing Transfers Transfers: Sit to Stand;Stand to Sit;Stand Pivot Transfers Sit to Stand: Minimal Assistance - Patient > 75% Stand to Sit: Minimal Assistance - Patient > 75% Stand Pivot Transfers: Minimal Assistance - Patient > 75% Stand Pivot Transfer Details: Verbal cues for sequencing;Verbal cues for precautions/safety;Verbal cues for safe use of DME/AE Stand Pivot Transfer Details (indicate cue type and reason): Pt very impulsive, poor safety awareness, attempts to sit before completing turn. Transfer (Assistive device): Rolling walker Locomotion  Gait Ambulation: No (pt scheduled for CT R foot, no gait attempted > 2-3' w/c to second surface.) Gait Gait: No Stairs / Additional Locomotion Stairs: No Wheelchair Mobility Wheelchair Mobility: Yes Wheelchair Assistance: Moderate Assistance - Patient 50 - 74% Wheelchair Propulsion: Both upper extremities (Pt veers to left, requiring manual assist to avoid obstacles.) Wheelchair Parts Management: Needs assistance Distance: 150   Discharge Criteria: Patient will be discharged from PT if patient refuses treatment 3 consecutive times without medical reason, if treatment goals not met, if there is a change in medical status, if patient makes no progress towards goals or if patient is discharged from hospital.  The above assessment, treatment plan, treatment alternatives and goals were discussed and mutually agreed upon: by patient  Ladoris Gene 12/08/2020, 4:18 PM

## 2020-12-08 NOTE — Progress Notes (Signed)
PROGRESS NOTE   Subjective/Complaints:   Pt c/o pain LUQ- under ribs- aching, no cramping.  LBM- doesn't remember when,  Hurting.  Also per nurse, c/p 10/10 pain when pushes on R dorsum of foot just to get a pulse.  Severe pain-   ROS:  Pt denies SOB, abd pain, CP, N/V/C/D, and vision changes    Objective:   DG Abd 1 View  Result Date: 12/08/2020 CLINICAL DATA:  Left upper quadrant abdominal pain. EXAM: ABDOMEN - 1 VIEW COMPARISON:  None. FINDINGS: The abdominal bowel gas pattern is unremarkable. No findings for small bowel obstruction or free air. The soft tissue shadows are maintained. No worrisome calcifications. The bony structures are intact. IMPRESSION: Unremarkable abdominal radiograph. Electronically Signed   By: Rudie Meyer M.D.   On: 12/08/2020 12:46   CT FOOT RIGHT WO CONTRAST  Result Date: 12/08/2020 CLINICAL DATA:  Diffuse right foot pain for the past month. Currently admitted for drug overdose. EXAM: CT OF THE RIGHT FOOT WITHOUT CONTRAST TECHNIQUE: Multidetector CT imaging of the right foot was performed according to the standard protocol. Multiplanar CT image reconstructions were also generated. COMPARISON:  Right foot x-rays dated November 30, 2020. FINDINGS: Bones/Joint/Cartilage No fracture or dislocation. Joint spaces are preserved. No joint effusion. Ligaments Ligaments are suboptimally evaluated by CT. Muscles and Tendons Grossly intact. Soft tissue No fluid collection or hematoma.  No soft tissue mass. IMPRESSION: 1. No acute osseous abnormality. Electronically Signed   By: Obie Dredge M.D.   On: 12/08/2020 14:24   Recent Labs    12/07/20 0424 12/08/20 0511  WBC 6.6 6.3  HGB 11.8* 12.0*  HCT 34.0* 34.3*  PLT 438* 419*   Recent Labs    12/07/20 0424 12/08/20 0511  NA 135 135  K 3.2* 3.5  CL 97* 97*  CO2 27 28  GLUCOSE 106* 120*  BUN 10 7  CREATININE 0.87 0.91  CALCIUM 9.4 9.4     Intake/Output Summary (Last 24 hours) at 12/08/2020 2033 Last data filed at 12/08/2020 1829 Gross per 24 hour  Intake 840 ml  Output --  Net 840 ml        Physical Exam: Vital Signs Blood pressure (!) 146/94, pulse 89, temperature 98.7 F (37.1 C), temperature source Oral, resp. rate 18, height 5\' 10"  (1.778 m), weight 75.3 kg, SpO2 99 %.   General: awake, alert, appropriate, poor memory; NAD HENT: conjugate gaze; oropharynx moist CV: regular rate; no JVD Pulmonary: CTA B/L; no W/R/R- good air movement GI: soft; TTP LUQ and lower chest; ND; normoactive Psychiatric: appropriate Neurological: alert- tangential  Musculoskeletal:        General: Tenderness (RLE) present.    Cervical back: Normal range of motion. Skin:    General: Skin is warm.    Comments: Multiple healed scars from old dog bites on RLE.   Neurological:    Comments: Speech clear but impulsive with poor safety awareness. Distracted. No focal CN findings. UE motor 5/5. LLE 4-5/5. RLE limited distally d/t pain. Pt with sensory loss below knee predominantly in the anterior leg and foot although he was diminished posteriorly in the calf, sole of foot as well. Additional sensory  loss in the posterior thigh.  Significant RLE weakness with right foot drop and dysesthesias right calf/shin. KE appears to at least 3/5 but difficult to test d/t pain with resistance against lower leg. KF 2-3/5 but difficult to test d/t pain. Pt has 5/5 HAD and HAB. 0/5 ADF and 2/5 APF. DTR's 1+ to absent      Assessment/Plan: 1. Functional deficits which require 3+ hours per day of interdisciplinary therapy in a comprehensive inpatient rehab setting. Physiatrist is providing close team supervision and 24 hour management of active medical problems listed below. Physiatrist and rehab team continue to assess barriers to discharge/monitor patient progress toward functional and medical goals  Care Tool:  Bathing    Body parts bathed by  patient: Right arm, Left arm, Chest, Abdomen, Front perineal area, Buttocks, Right upper leg, Left upper leg, Face   Body parts bathed by helper: Right lower leg     Bathing assist Assist Level: Minimal Assistance - Patient > 75%     Upper Body Dressing/Undressing Upper body dressing   What is the patient wearing?: Pull over shirt    Upper body assist Assist Level: Minimal Assistance - Patient > 75%    Lower Body Dressing/Undressing Lower body dressing      What is the patient wearing?: Pants     Lower body assist Assist for lower body dressing: Moderate Assistance - Patient 50 - 74%     Toileting Toileting    Toileting assist Assist for toileting: Moderate Assistance - Patient 50 - 74%     Transfers Chair/bed transfer  Transfers assist     Chair/bed transfer assist level: Minimal Assistance - Patient > 75%     Locomotion Ambulation   Ambulation assist   Ambulation activity did not occur: Safety/medical concerns (pt scheduled for CT scan R foot r/o pathology)          Walk 10 feet activity   Assist           Walk 50 feet activity   Assist           Walk 150 feet activity   Assist           Walk 10 feet on uneven surface  activity   Assist Walk 10 feet on uneven surfaces activity did not occur: Safety/medical concerns         Wheelchair     Assist Will patient use wheelchair at discharge?: No Type of Wheelchair: Manual    Wheelchair assist level: Moderate Assistance - Patient 50 - 74% Max wheelchair distance: 150    Wheelchair 50 feet with 2 turns activity    Assist        Assist Level: Moderate Assistance - Patient 50 - 74%   Wheelchair 150 feet activity     Assist      Assist Level: Moderate Assistance - Patient 50 - 74%   Blood pressure (!) 146/94, pulse 89, temperature 98.7 F (37.1 C), temperature source Oral, resp. rate 18, height 5\' 10"  (1.778 m), weight 75.3 kg, SpO2 99 %.  Medical  Problem List and Plan: 1.   Functional deficits secondary to anoxic brain injury and rhabdomyolysis. 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             -patient may shower             -ELOS/Goals: 28-30 days, mod assist goals with PT, OT, SLP  -first day of evaluations- PT, OT and SLP  2.  Antithrombotics: -DVT/anticoagulation:  Pharmaceutical: Lovenox             -antiplatelet therapy:  N/a 3. Right lower extremity dysesthetic pain:              -pt denied any back pain today and there was none one exam either. Exam most consistent with sciatic nerve injury in the proximal right thigh which is entirely possible given his rhabdomyolysis/down-time. I don't think it's related to his ABI either. Continue Oxycodone BID with tylenol prn.              -begin lyrica for RLE   6/11- pt c/o LUQ pain and R foot pain- con't regimen- R foot CT and KUB OK. . 4. Mood:  LCSW to follow for evaluation and support.              -antipsychotic agents: N/A 5. Neuropsych: This patient is not fully capable of making decisions on his own behalf. 6. Skin/Wound Care: Pressure relief measures. Will add juven for supplement (tastes better than prosource)             --Will add vitamin C and Zinc. 7. Fluids/Electrolytes/Nutrition: Monitor I/O. Check CMET in am. 8. Polysubstance abuse/Hypoxic encephalopathy: Has completed clonidine taper to help with withdrawal             --Oxycodone has been weaned down to 5 mg bid as off on 05/30.               --Seroquel 100 mg hs/50 mg daily. Monitor sleep wake cycle.             --Continue thiamine and folic acid for supplement.             --posturing due to ABI/no seizures documented.   9. Aspiration PNA: Completed IV Vanc on 05/20. Respiratory status stable 10. Acute blood loss anemia: Will continue to monitor for signs of bleeding. --is recovering 11. Acute renal failure: Due to rhabdomyolysis has resolved with improvement in K-[id number] 12. Blood  pressure: Intermittent elevation noted.             --continue to monitor for now 13. Hypokalemia: Was supplemented. Po intake reported to be poor and refusing supplements.             --recheck potassium level in am. 14. Thrombocytosis: Slowly trending down 15. Superficial thrombosis LUE: Local measures with elevation for edema management/ heat prn.   16. R foot pain  6/11- con't pain meds- CT of R foot is OK.  17. LUQ pain-   6/11- KUB looks OK- con't to monitor    LOS: 1 days A FACE TO FACE EVALUATION WAS PERFORMED  Nathaniel Lynn 12/08/2020, 8:33 PM

## 2020-12-08 NOTE — Evaluation (Signed)
Speech Language Pathology Assessment and Plan  Patient Details  Name: DEVARIO BUCKLEW MRN: [id number] Date of Birth: 12-17-1990  SLP Diagnosis: Cognitive Impairments  Rehab Potential: Excellent ELOS: 10-12 days    Today's Date: 12/08/2020 SLP Individual Time: 9935-7017 SLP Individual Time Calculation (min): 55 min   Hospital Problem: Principal Problem:   Anoxic brain injury [facility])  Past Medical History:  Past Medical History:  Diagnosis Date   IVDU (intravenous drug user)    Past Surgical History: History reviewed. No pertinent surgical history.  Assessment / Plan / Recommendation Clinical Impression  Patient is a 30 y.o. male with a history of narcotic abuse recently release from 2 year prison sentence who was found down reportedly "getting high" at girlfriend's house on 5/14, taken to [facility] for evaluation. Labs notable for +UDS (amphetamines, opiates, cannabinoids), Cr 2.33 (baseline 0.8-1.2), CK level > 50K, LA 3.0. CT Head was negative for acute pathology. While in the ED, the patient was given Ativan x 2 for tonic-clonic seizure-like activity and was intubated shortly after seizure-like activity was noted. Concern for "posturing". Transferred to [facility] ICU for further evaluation and continuous EEG monitoring. Empiric antibiotics were given and withdrawn after unremarkable LP performed 5/19 with acyclovir stopped after negative HSV. AKI improved with fluids. Tracheal aspirate grew stenotrophomonas and flavobacterium for which 10-day course of levaquin was started on 5/21. No purposeful movements despite weaning sedation initially, though he eventually began following commands intermittently. EEG demonstrated nonspecific moderate diffuse encephalopathy without epileptiform discharges. MRI brain suggested global hypoxic ischemic injury. Precedex was given for agitation and patient extubated 5/25. The patient was transferred to hospitalist service 5/28 still requiring prn sedation for agitation  and having post-ATN diuresis and hypernatremia which has improved. He continues to have issues with higher level cognitive deficits, issues with problem solving as well as right sided weakness with  worsening of RLE pain in the past 2 days. CIR recommended due to functional decline. Patient admitted 12/07/20.   Upon arrival, patient was asleep in bed and required extra time and Min verbal cues to rouse. Patient was initially labile when answering questions regarding orientation to situation and intellectual awareness of impairments. However, as session, continued, patient appeared brighter and more comfortable resulting in increased verbal expression and response to questions. SLP administered the [facility] Mental Status Examination (SLUMS). Patient scored  12/30 points with a score of 25 or above considered normal. However, patient appeared to improve cognitively within functional situations. Overall, patient demonstrates moderate impairments in emergent awareness, functional problem solving, recall of daily information and selective attention. Patient also reports feeling as if he is in a "brain fog" with delayed processing. Expressive and receptive language appeared [facility] for all tasks assessed. Patient would benefit from skilled SLP intervention to maximize his cognitive functioning and overall functional independence prior to discharge.     Skilled Therapeutic Interventions          Administered a cognitive-linguistic evaluation, please see above for details.   SLP Assessment  Patient will need skilled [facility] during CIR admission    Recommendations  Oral Care Recommendations: Oral care BID Recommendations for Other Services: Neuropsych consult Patient destination: Home Follow up Recommendations: 24 hour supervision/assistance;Outpatient SLP;Home Health SLP Equipment Recommended: None recommended by SLP    SLP Frequency 3 to 5 out of 7 days   SLP  Duration  SLP Intensity  SLP Treatment/Interventions 10-12 days  Minumum of 1-2 x/day, 30 to 90 minutes  Cognitive remediation/compensation;Internal/external aids;Cueing  hierarchy;Environmental controls;Therapeutic Activities;Functional tasks;Patient/family education    Pain Pain Assessment Pain Scale: 0-10 Pain Score: 0-No pain  Prior Functioning Type of Home: Mobile home  Lives With: Family Available Help at Discharge: Family;Available 24 hours/day  SLP Evaluation Cognition Overall Cognitive Status: Impaired/Different from baseline Arousal/Alertness: Awake/alert Orientation Level: Oriented to person;Oriented to place;Disoriented to time;Disoriented to situation Attention: Sustained Sustained Attention: Impaired Memory: Impaired Memory Impairment: Storage deficit;Retrieval deficit;Decreased recall of new information Immediate Memory Recall: Blue;Sock;Bed Memory Recall Sock: With Cue Memory Recall Blue: Without Cue Memory Recall Bed: Not able to recall Awareness: Impaired Awareness Impairment: Intellectual impairment Problem Solving: Impaired Problem Solving Impairment: Verbal basic;Functional basic Behaviors: Lability Safety/Judgment: Impaired Comments: poor safety awareness  Comprehension Auditory Comprehension Overall Auditory Comprehension: Appears within functional limits for tasks assessed Expression Expression Primary Mode of Expression: Verbal Verbal Expression Overall Verbal Expression: Appears within functional limits for tasks assessed Written Expression Dominant Hand: Right Written Expression: Not tested Oral Motor Oral Motor/Sensory Function Overall Oral Motor/Sensory Function: Within functional limits Motor Speech Overall Motor Speech: Appears within functional limits for tasks assessed  Care Tool Care Tool Cognition Expression of Ideas and Wants Expression of Ideas and Wants: Some difficulty - exhibits some difficulty with expressing needs and  ideas (e.g, some words or finishing thoughts) or speech is not clear   Understanding Verbal and Non-Verbal Content Understanding Verbal and Non-Verbal Content: Usually understands - understands most conversations, but misses some part/intent of message. Requires cues at times to understand   Memory/Recall Ability *first 3 days only Memory/Recall Ability *first 3 days only: Current season;That he or she is in a hospital/hospital unit     Short Term Goals: Week 1: SLP Short Term Goal 1 (Week 1): Patient will demonstrate functional problem solving for mildly complex tasks with Min verbal cues for. SLP Short Term Goal 2 (Week 1): Patient will recall new, daily information with Min verbal and visual cues. SLP Short Term Goal 3 (Week 1): Patient will demonstrate selective attention to functional tasks in a mildly distracting enviornment for 45 minutes with Min verbal cues for redirection. SLP Short Term Goal 4 (Week 1): Patient will self-monitor and correct errors during functional tasks with Min verbal cues.  Refer to Care Plan for Long Term Goals  Recommendations for other services: Neuropsych  Discharge Criteria: Patient will be discharged from SLP if patient refuses treatment 3 consecutive times without medical reason, if treatment goals not met, if there is a change in medical status, if patient makes no progress towards goals or if patient is discharged from hospital.  The above assessment, treatment plan, treatment alternatives and goals were discussed and mutually agreed upon: by patient  Abigael Mogle 12/08/2020, 12:32 PM

## 2020-12-08 NOTE — Progress Notes (Signed)
Patient did not sleep well despite trazadone and seroquel due to pain in the right foot. Patient is on tylenol and robaxin prn with minimal relief.

## 2020-12-09 MED ORDER — ASCORBIC ACID 500 MG PO TABS
500.0000 mg | ORAL_TABLET | Freq: Every day | ORAL | Status: DC
  Administered 2020-12-09 – 2020-12-21 (×13): 500 mg via ORAL
  Filled 2020-12-09 (×13): qty 1

## 2020-12-09 MED ORDER — TRAZODONE HCL 50 MG PO TABS
100.0000 mg | ORAL_TABLET | Freq: Every day | ORAL | Status: DC
  Administered 2020-12-10 – 2020-12-20 (×12): 100 mg via ORAL
  Filled 2020-12-09 (×12): qty 2

## 2020-12-09 MED ORDER — ZINC SULFATE 220 (50 ZN) MG PO CAPS
220.0000 mg | ORAL_CAPSULE | Freq: Every day | ORAL | Status: DC
  Administered 2020-12-09 – 2020-12-21 (×13): 220 mg via ORAL
  Filled 2020-12-09 (×13): qty 1

## 2020-12-09 NOTE — Progress Notes (Signed)
PROGRESS NOTE   Subjective/Complaints:   Pt asleep- per nurse ate breakfast, was awake, but didn't sleep well, so went back to sleep.    ROS: Limited due to sedation.   Objective:   DG Abd 1 View  Result Date: 12/08/2020 CLINICAL DATA:  Left upper quadrant abdominal pain. EXAM: ABDOMEN - 1 VIEW COMPARISON:  None. FINDINGS: The abdominal bowel gas pattern is unremarkable. No findings for small bowel obstruction or free air. The soft tissue shadows are maintained. No worrisome calcifications. The bony structures are intact. IMPRESSION: Unremarkable abdominal radiograph. Electronically Signed   By: Rudie Meyer M.D.   On: 12/08/2020 12:46   CT FOOT RIGHT WO CONTRAST  Result Date: 12/08/2020 CLINICAL DATA:  Diffuse right foot pain for the past month. Currently admitted for drug overdose. EXAM: CT OF THE RIGHT FOOT WITHOUT CONTRAST TECHNIQUE: Multidetector CT imaging of the right foot was performed according to the standard protocol. Multiplanar CT image reconstructions were also generated. COMPARISON:  Right foot x-rays dated November 30, 2020. FINDINGS: Bones/Joint/Cartilage No fracture or dislocation. Joint spaces are preserved. No joint effusion. Ligaments Ligaments are suboptimally evaluated by CT. Muscles and Tendons Grossly intact. Soft tissue No fluid collection or hematoma.  No soft tissue mass. IMPRESSION: 1. No acute osseous abnormality. Electronically Signed   By: Obie Dredge M.D.   On: 12/08/2020 14:24   Recent Labs    12/07/20 0424 12/08/20 0511  WBC 6.6 6.3  HGB 11.8* 12.0*  HCT 34.0* 34.3*  PLT 438* 419*   Recent Labs    12/07/20 0424 12/08/20 0511  NA 135 135  K 3.2* 3.5  CL 97* 97*  CO2 27 28  GLUCOSE 106* 120*  BUN 10 7  CREATININE 0.87 0.91  CALCIUM 9.4 9.4    Intake/Output Summary (Last 24 hours) at 12/09/2020 1221 Last data filed at 12/09/2020 0800 Gross per 24 hour  Intake 840 ml  Output 1 ml   Net 839 ml        Physical Exam: Vital Signs Blood pressure (!) 153/99, pulse 83, temperature 98.4 F (36.9 C), temperature source Oral, resp. rate 18, height 5\' 10"  (1.778 m), weight 75.3 kg, SpO2 99 %.    General: asleep- supine in bed; woke to stimuli, but back to sleep;  NAD HENT: conjugate gaze; oropharynx moist CV: regular rate; no JVD Pulmonary: CTA B/L; no W/R/R- good air movement GI: soft, NT, ND, (+)BS Psychiatric: sleepy Neurological: sleepy- cannot assess orientation  Musculoskeletal:        General: Tenderness (RLE) present. Esp over dorsum of R foot.     Cervical back: Normal range of motion. Skin:    General: Skin is warm.    Comments: Multiple healed scars from old dog bites on RLE.   Neurological:    Comments: Speech clear but impulsive with poor safety awareness. Distracted. No focal CN findings. UE motor 5/5. LLE 4-5/5. RLE limited distally d/t pain. Pt with sensory loss below knee predominantly in the anterior leg and foot although he was diminished posteriorly in the calf, sole of foot as well. Additional sensory loss in the posterior thigh.  Significant RLE weakness with right foot drop and  dysesthesias right calf/shin. KE appears to at least 3/5 but difficult to test d/t pain with resistance against lower leg. KF 2-3/5 but difficult to test d/t pain. Pt has 5/5 HAD and HAB. 0/5 ADF and 2/5 APF. DTR's 1+ to absent      Assessment/Plan: 1. Functional deficits which require 3+ hours per day of interdisciplinary therapy in a comprehensive inpatient rehab setting. Physiatrist is providing close team supervision and 24 hour management of active medical problems listed below. Physiatrist and rehab team continue to assess barriers to discharge/monitor patient progress toward functional and medical goals  Care Tool:  Bathing    Body parts bathed by patient: Right arm, Left arm, Chest, Abdomen, Front perineal area, Buttocks, Right upper leg, Left upper leg, Face    Body parts bathed by helper: Right lower leg     Bathing assist Assist Level: Minimal Assistance - Patient > 75%     Upper Body Dressing/Undressing Upper body dressing   What is the patient wearing?: Pull over shirt    Upper body assist Assist Level: Minimal Assistance - Patient > 75%    Lower Body Dressing/Undressing Lower body dressing      What is the patient wearing?: Pants     Lower body assist Assist for lower body dressing: Moderate Assistance - Patient 50 - 74%     Toileting Toileting    Toileting assist Assist for toileting: Moderate Assistance - Patient 50 - 74%     Transfers Chair/bed transfer  Transfers assist     Chair/bed transfer assist level: Minimal Assistance - Patient > 75%     Locomotion Ambulation   Ambulation assist   Ambulation activity did not occur: Safety/medical concerns (pt scheduled for CT scan R foot r/o pathology)          Walk 10 feet activity   Assist           Walk 50 feet activity   Assist           Walk 150 feet activity   Assist           Walk 10 feet on uneven surface  activity   Assist Walk 10 feet on uneven surfaces activity did not occur: Safety/medical concerns         Wheelchair     Assist Will patient use wheelchair at discharge?: No Type of Wheelchair: Manual    Wheelchair assist level: Moderate Assistance - Patient 50 - 74% Max wheelchair distance: 150    Wheelchair 50 feet with 2 turns activity    Assist        Assist Level: Moderate Assistance - Patient 50 - 74%   Wheelchair 150 feet activity     Assist      Assist Level: Moderate Assistance - Patient 50 - 74%   Blood pressure (!) 153/99, pulse 83, temperature 98.4 F (36.9 C), temperature source Oral, resp. rate 18, height 5\' 10"  (1.778 m), weight 75.3 kg, SpO2 99 %.  Medical Problem List and Plan: 1.   Functional deficits secondary to anoxic brain injury and rhabdomyolysis. Also appears to  have right lower extremity sciatic nerve injury.             -patient may shower             -ELOS/Goals: 28-30 days, mod assist goals with PT, OT, SLP  -fcon't PT, OT and SLP 2.  Antithrombotics: -DVT/anticoagulation:  Pharmaceutical: Lovenox             -  antiplatelet therapy:  N/a 3. Right lower extremity dysesthetic pain:              -pt denied any back pain today and there was none one exam either. Exam most consistent with sciatic nerve injury in the proximal right thigh which is entirely possible given his rhabdomyolysis/down-time. I don't think it's related to his ABI either. Continue Oxycodone BID with tylenol prn.              -begin lyrica for RLE   6/11- pt c/o LUQ pain and R foot pain- con't regimen- R foot CT and KUB OK. . 6/12- no complaints due ot sedation- con't regimen 4. Mood:  LCSW to follow for evaluation and support.              -antipsychotic agents: N/A 5. Neuropsych: This patient is not fully capable of making decisions on his own behalf. 6. Skin/Wound Care: Pressure relief measures. Will add juven for supplement (tastes better than prosource)             --Will add vitamin C and Zinc. 7. Fluids/Electrolytes/Nutrition: Monitor I/O. Check CMET in am. 8. Polysubstance abuse/Hypoxic encephalopathy: Has completed clonidine taper to help with withdrawal             --Oxycodone has been weaned down to 5 mg bid as off on 05/30.               --Seroquel 100 mg hs/50 mg daily. Monitor sleep wake cycle.             --Continue thiamine and folic acid for supplement.             --posturing due to ABI/no seizures documented.   9. Aspiration PNA: Completed IV Vanc on 05/20. Respiratory status stable 10. Acute blood loss anemia: Will continue to monitor for signs of bleeding. --is recovering 11. Acute renal failure: Due to rhabdomyolysis has resolved with improvement in K-159 12. Blood pressure: Intermittent elevation noted.             --continue to monitor for now 13.  Hypokalemia: Was supplemented. Po intake reported to be poor and refusing supplements.             --recheck potassium level in am.  6/12- K+ borderline low at 3.5- will recheck Monday 14. Thrombocytosis: Slowly trending down 15. Superficial thrombosis LUE: Local measures with elevation for edema management/ heat prn.   16. R foot pain  6/11- con't pain meds- CT of R foot is OK.  17. LUQ pain-   6/11- KUB looks OK- con't to monitor 18. Insomnia  6/12- will increase trazodone to 100 mg QHS.     LOS: 2 days A FACE TO FACE EVALUATION WAS PERFORMED  Nathaniel Lynn 12/09/2020, 12:21 PM

## 2020-12-09 NOTE — Progress Notes (Signed)
Occupational Therapy Session Note  Patient Details  Name: Nathaniel Lynn MRN: [id number] Date of Birth: 1990/09/26  Today's Date: 12/09/2020 OT Individual Time: 1035-1105 OT Individual Time Calculation (min): 30 min   Session 2: OT Individual Time: 9574-7340 OT Individual Time Calculation (min): 28 min    Short Term Goals: Week 1:  OT Short Term Goal 1 (Week 1): Patient will complete toilet transfer with CGA. OT Short Term Goal 2 (Week 1): Pt will don socks with min A OT Short Term Goal 3 (Week 1): Pt tolerate standing at the sink with B LEs evenly on the floor for 1 minute within grooming task  Skilled Therapeutic Interventions/Progress Updates:   Session 1: Pt supine, c/o 7/10 pain in his R LE. He reports this is constant and requests shower as pain intervention. Pt completed bed mobility with supervision. He used RW to complete functional mobility into the bathroom, offloading RLE with more like PWB-TDWB d/t pain but no more than CGA required. Pt required min cueing for sequencing transfer to TTB in walk in shower. Min A to doff pants d/t RLE pain. Pt reports the year is 2020 and month is May, gentle reorientation provided. Pt able to sequence bathing appropriately, remaining seated except for peri care with close supervision when standing. He required min A to don underwear and shorts, to thread over R LE. Shirt donned with mod I. Pt returned to EOB with the RW. Pt was left supine with all needs met, bed alarm set.   Session 2:  Pt received supine, sleeping but easily awoken and eager for OT session. Pt reporting 6/10 pain, requesting rest breaks for pain relief and these were provided hroughout session. Pt completed 120 ft of functional mobility with the RW to the therapy gym with CGA. He required cueing for RW management and sequencing. He completed standing level RLE stepping activity to strengthen hip flexors and promote improved RLE proprioception. He required min cueing and CGA. RW  removed to increase balance demands. Pt then completed standing level BUE strengthening circuit without UE support to challenge BUE weightbearing and standing balance. 2 trials done of each circuit, holding a 7 lb dumbbell. Pt returned to his room and was left supine with all needs met, bed alarm set.    Therapy Documentation Precautions:  Precautions Precautions: Fall Precaution Comments: R-side weakness and lower leg pain Restrictions Weight Bearing Restrictions: No Other Position/Activity Restrictions: Pt scheduled for CT scan of R foot to r/o pathology, attempted to maintain NWB but pt unable to maintain, but appears TDWB only.  Therapy/Group: Individual Therapy  Curtis Sites 12/09/2020, 6:46 AM

## 2020-12-10 LAB — CBC
HCT: 33.4 % — ABNORMAL LOW (ref 39.0–52.0)
Hemoglobin: 11.6 g/dL — ABNORMAL LOW (ref 13.0–17.0)
MCH: 30.7 pg (ref 26.0–34.0)
MCHC: 34.7 g/dL (ref 30.0–36.0)
MCV: 88.4 fL (ref 80.0–100.0)
Platelets: 370 10*3/uL (ref 150–400)
RBC: 3.78 MIL/uL — ABNORMAL LOW (ref 4.22–5.81)
RDW: 12.1 % (ref 11.5–15.5)
WBC: 6.4 10*3/uL (ref 4.0–10.5)
nRBC: 0 % (ref 0.0–0.2)

## 2020-12-10 LAB — BASIC METABOLIC PANEL
Anion gap: 9 (ref 5–15)
BUN: 7 mg/dL (ref 6–20)
CO2: 28 mmol/L (ref 22–32)
Calcium: 9.2 mg/dL (ref 8.9–10.3)
Chloride: 98 mmol/L (ref 98–111)
Creatinine, Ser: 0.86 mg/dL (ref 0.61–1.24)
GFR, Estimated: >60 mL/min (ref >60)
Glucose, Bld: 111 mg/dL — ABNORMAL HIGH (ref 70–99)
Potassium: 3.5 mmol/L (ref 3.5–5.1)
Sodium: 135 mmol/L (ref 135–145)

## 2020-12-10 MED ORDER — PREGABALIN 50 MG PO CAPS
50.0000 mg | ORAL_CAPSULE | Freq: Three times a day (TID) | ORAL | Status: DC
  Administered 2020-12-10 – 2020-12-13 (×8): 50 mg via ORAL
  Filled 2020-12-10 (×8): qty 1

## 2020-12-10 NOTE — Progress Notes (Signed)
Physical Therapy Session Note  Patient Details  Name: Nathaniel Lynn MRN: [id number] Date of Birth: January 06, 1991  Today's Date: 12/10/2020 PT Individual Time: 1400-1430 and 800-915 PT Individual Time Calculation (min): 30 min and  Short Term Goals: Week 1:  PT Short Term Goal 1 (Week 1): Pt will transfer sit to stand w/ CGA w/ LRAD. PT Short Term Goal 2 (Week 1): Pt will transfer bed <> w/c w/ CGA. PT Short Term Goal 3 (Week 1): Pt will amb 65' w/ LRAD and CGA.  Skilled Therapeutic Interventions/Progress Updates:    Am session:  Pain:  Pt reports RLE 10/10 pain.  Treatment to tolerance.  Rest breaks and repositioning as needed.  Pt initially sleeping, easily awakened and agreeable to treatment session w/focus on   Supine to sit w/supervision. Pt dons L sock w/supervision, R sock w/total assist per pt request/due to pain. stand pivot transfer to wc using RW, pt chooses NWB RLE due to pain. Propels wc w/ min assist 135ft w/bilat Ues RLE examination: Palpation reveals diffuse tenderness throughout entire foot and distal lower leg, pt describes as burning/tingling/pins and needles Strength: PF 2/5 DF 0/5 Eversion 0/5 Inversion 1/5 Toe flexion and extension including great toe - 0/5 Pt w/notable scarring throughout RLE which pt explains is due to dog attack approx 2 years ago. Pt denies any issues w/his limb up until approx 1 mos ago during this hospital admission ? when he experienced a fall. Pt hx is somewhat questionable.   Therapist performed light soft tissue mobilization to dorsal and plantar aspects of foot.  Sit to stand onto airex foam to work on gradual wbing to RLE/desensitization using RW to offload due to limited tolerance.  Stood approx 3 min perfoming intermittent wbing. Therapist applied DF assist acewrap + mild compression to distal RLE for purpose of addressing footdrop as well as desensitization.    Gait 157ft x 2 w/RW, step to gait, R footdrop, limited  wbearing tolerated to RLE but can achieve footflat w/step to at times. Pt exhibits poor safety awareness w/transfers/Sit to stand does not turn safely/thoroughly to seating surface and repeats behavior mult times. Pt instructed w/ascending/descending stairs, step to technique.   Ascends descends 4 stairs w/heavy reliance on UE support on 2 rails for offloading due to pain, cga and cues for safety.  Wc propulsion gym to room > 119ft w/cues, min assist  Wc to bed w/RW, cga, max cues for safety.  Sit to supine w/supervision.  Pt left supine w/rails up x 3, alarm set, bed in lowest position, and needs in reach.      PM SESSION:  Pain:  Pt reports 3-7/10 R foot. pain.  Ice applied to R foot/ankle during sessionTreatment to tolerance.  Rest breaks and repositioning as needed.  Pt initially supine w/ST, completing session and agreeable to treatment session w/focus on RLE pain control, UE coordination, functional mobility, cognition.  Pt squat pivot transfer to wc w/max cues for safety, min assist. Propels wc x 127ft w/min assist to steer, veers L. Sit to stand in parallel bars w/cga.  Stood and performed RLE AROM marching in place but c/o increasing pain after 9 reps.  Repeats "I can't do this!" Citing pain.    Pt participated in seated bolt box activity matching nuts/bolts and corresponding holes in board, some difficulty manipulating bolts/coordinating dual task, becomes easily frustrated.  Repires mult changes in position of RLE due to intermittenty pain.  Completed approx 75% of task before becoming mildly agitated  w/task.    Pt then performs peg puzzle, provided w/10 pieces needed to complete puzzle, stacked straight line puzzle, pt places incorrect stacks but in straight line.  When reinstructed, pt completes approx 60% accuracy.  Requires direct cues to recognize and fix errors.  Pt donned gloves and assisted w/cleaning peg puzzle and parts.   Wc propulsion x 166ft w/min assist to room.   Squat pivot wc to bed w/cga and  cues for safety.  Sit to sidelying w/supervision. Pt left supine w/rails up x 3, alarm set, bed in lowest position, and needs in reach.  Therapy Documentation Precautions:  Precautions Precautions: Fall Precaution Comments: R-side weakness and lower leg pain Restrictions Weight Bearing Restrictions: No Other Position/Activity Restrictions: Pt scheduled for CT scan of R foot to r/o pathology, attempted to maintain NWB but pt unable to maintain, but appears TDWB only.    Therapy/Group: Individual Therapy Rada Hay, PT   Shearon Balo 12/10/2020, 4:14 PM

## 2020-12-10 NOTE — Progress Notes (Signed)
Speech Language Pathology Daily Session Note  Patient Details  Name: Nathaniel Lynn MRN: [id number] Date of Birth: 1991-01-26  Today's Date: 12/10/2020 SLP Individual Time: 1300-1345 SLP Individual Time Calculation (min): 45 min  Short Term Goals: Week 1: SLP Short Term Goal 1 (Week 1): Patient will demonstrate functional problem solving for mildly complex tasks with Min verbal cues for. SLP Short Term Goal 2 (Week 1): Patient will recall new, daily information with Min verbal and visual cues. SLP Short Term Goal 3 (Week 1): Patient will demonstrate selective attention to functional tasks in a mildly distracting enviornment for 45 minutes with Min verbal cues for redirection. SLP Short Term Goal 4 (Week 1): Patient will self-monitor and correct errors during functional tasks with Min verbal cues.  Skilled Therapeutic Interventions:   Skilled SLP treatment performed with focus on cognitive goals. Mr. Kunin was very restless secondary right foot pain resulting in continuous repositioning which interfered with attention to today's tasks. Nurse is aware of discomfort and patient was given pain medication shortly prior to therapy session. Also notified PT. SLP facilitated medication management task with Mod A verbal and visual cues. Patient identified medication organization errors in weekly pillbox with 50% accuracy. Required additional processing time and verbal explanation for comprehension of medication instructions (e.g., take 2 pills once daily). Considering performance was likely impacted by pain, recommend continuation of similar task during following session. Patient was left in bed with alarm activated and all needs within reach. Continue current plan of care.   Pain Pain Assessment Pain Score: 8  Faces Pain Scale: Hurts whole lot Pain Location: Foot Pain Orientation: Right Pain Descriptors / Indicators: Grimacing;Restless Pain Onset: On-going Patients Stated Pain Goal: 5 Pain  Intervention(s): RN made aware;Repositioned (Medication given prior to therapy session) Multiple Pain Sites: No  Therapy/Group: Individual Therapy  Tamala Ser 12/10/2020, 3:07 PM

## 2020-12-10 NOTE — Progress Notes (Signed)
PROGRESS NOTE   Subjective/Complaints: C/o neuropathic pain in right foot, sole, 1st and 2nd digits, severe. Will increase Lyrica to 50mg  TID No other complaints Alert   ROS: +right foot pain  Objective:   No results found. Recent Labs    12/08/20 0511 12/10/20 0548  WBC 6.3 6.4  HGB 12.0* 11.6*  HCT 34.3* 33.4*  PLT 419* 370   Recent Labs    12/08/20 0511 12/10/20 0548  NA 135 135  K 3.5 3.5  CL 97* 98  CO2 28 28  GLUCOSE 120* 111*  BUN 7 7  CREATININE 0.91 0.86  CALCIUM 9.4 9.2    Intake/Output Summary (Last 24 hours) at 12/10/2020 1440 Last data filed at 12/10/2020 1300 Gross per 24 hour  Intake 360 ml  Output --  Net 360 ml        Physical Exam: Vital Signs Blood pressure (!) 146/103, pulse (!) 107, temperature 99.4 F (37.4 C), temperature source Oral, resp. rate 18, height 5\' 10"  (1.778 m), weight 75.3 kg, SpO2 100 %.  Gen: no distress, normal appearing HEENT: oral mucosa pink and moist, NCAT Cardio: Tachycardia Chest: normal effort, normal rate of breathing Abd: soft, non-distended Ext: no edema Psych: pleasant, normal affect  Musculoskeletal:        General: Tenderness (RLE) present. Esp over dorsum of R foot.     Cervical back: Normal range of motion. Skin:    General: Skin is warm.    Comments: Multiple healed scars from old dog bites on RLE.   Neurological:    Comments: Speech clear but impulsive with poor safety awareness. Distracted. No focal CN findings. UE motor 5/5. LLE 4-5/5. RLE limited distally d/t pain. Pt with sensory loss below knee predominantly in the anterior leg and foot although he was diminished posteriorly in the calf, sole of foot as well. Additional sensory loss in the posterior thigh.  Significant RLE weakness with right foot drop and dysesthesias right calf/shin. KE appears to at least 3/5 but difficult to test d/t pain with resistance against lower leg. KF 2-3/5  but difficult to test d/t pain. Pt has 5/5 HAD and HAB. 0/5 ADF and 2/5 APF. DTR's 1+ to absent      Assessment/Plan: 1. Functional deficits which require 3+ hours per day of interdisciplinary therapy in a comprehensive inpatient rehab setting. Physiatrist is providing close team supervision and 24 hour management of active medical problems listed below. Physiatrist and rehab team continue to assess barriers to discharge/monitor patient progress toward functional and medical goals  Care Tool:  Bathing    Body parts bathed by patient: Right arm, Left arm, Chest, Abdomen, Front perineal area, Buttocks, Right upper leg, Left upper leg, Face, Left lower leg   Body parts bathed by helper: Right lower leg     Bathing assist Assist Level: Minimal Assistance - Patient > 75%     Upper Body Dressing/Undressing Upper body dressing   What is the patient wearing?: Pull over shirt    Upper body assist Assist Level: Supervision/Verbal cueing    Lower Body Dressing/Undressing Lower body dressing      What is the patient wearing?: Pants, Underwear/pull up  Lower body assist Assist for lower body dressing: Contact Guard/Touching assist     Toileting Toileting    Toileting assist Assist for toileting: Contact Guard/Touching assist     Transfers Chair/bed transfer  Transfers assist     Chair/bed transfer assist level: Minimal Assistance - Patient > 75%     Locomotion Ambulation   Ambulation assist   Ambulation activity did not occur: Safety/medical concerns (pt scheduled for CT scan R foot r/o pathology)          Walk 10 feet activity   Assist           Walk 50 feet activity   Assist           Walk 150 feet activity   Assist           Walk 10 feet on uneven surface  activity   Assist Walk 10 feet on uneven surfaces activity did not occur: Safety/medical concerns         Wheelchair     Assist Will patient use wheelchair at  discharge?: No Type of Wheelchair: Manual    Wheelchair assist level: Moderate Assistance - Patient 50 - 74% Max wheelchair distance: 150    Wheelchair 50 feet with 2 turns activity    Assist        Assist Level: Moderate Assistance - Patient 50 - 74%   Wheelchair 150 feet activity     Assist      Assist Level: Moderate Assistance - Patient 50 - 74%   Blood pressure (!) 146/103, pulse (!) 107, temperature 99.4 F (37.4 C), temperature source Oral, resp. rate 18, height 5\' 10"  (1.778 m), weight 75.3 kg, SpO2 100 %.  Medical Problem List and Plan: 1.   Functional deficits secondary to anoxic brain injury and rhabdomyolysis. 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             -patient may shower             -ELOS/Goals: 28-30 days, mod assist goals with PT, OT, SLP  -Continue PT, OT and SLP 2.  Antithrombotics: -DVT/anticoagulation:  Pharmaceutical: Lovenox             -antiplatelet therapy:  N/a 3. Right lower extremity dysesthetic pain:              -pt denied any back pain today and there was none one exam either. Exam most consistent with sciatic nerve injury in the proximal right thigh which is entirely possible given his rhabdomyolysis/down-time. I don't think it's related to his ABI either. Continue Oxycodone BID with tylenol prn.              -begin lyrica for RLE   6/11- pt c/o LUQ pain and R foot pain- con't regimen- R foot CT and KUB OK. . 6/13- increase Lyrica to 50mg  given right foot pain still severe 4. Mood:  LCSW to follow for evaluation and support.              -antipsychotic agents: N/A 5. Neuropsych: This patient is not fully capable of making decisions on his own behalf. 6. Skin/Wound Care: Pressure relief measures. Will add juven for supplement (tastes better than prosource)             --Will add vitamin C and Zinc. 7. Fluids/Electrolytes/Nutrition: Monitor I/O. Check CMET in am. 8. Polysubstance abuse/Hypoxic  encephalopathy: Has completed clonidine taper to help with withdrawal             --  Oxycodone has been weaned down to 5 mg bid as off on 05/30.               --Seroquel 100 mg hs/50 mg daily. Monitor sleep wake cycle.             --Continue thiamine and folic acid for supplement.             --posturing due to ABI/no seizures documented.   9. Aspiration PNA: Completed IV Vanc on 05/20. Respiratory status stable 10. Acute blood loss anemia: Will continue to monitor for signs of bleeding. --is recovering, stable, repeat Hgb weekly 11. Acute renal failure: Due to rhabdomyolysis has resolved with improvement in K-159 12. Blood pressure: Intermittent elevation noted.             --continue to monitor for now 13. Hypokalemia: Was supplemented. Po intake reported to be poor and refusing supplements.             --recheck potassium level in am.  6/12- K+ borderline low at 3.5- will recheck Monday 14. Thrombocytosis: Slowly trending down 15. Superficial thrombosis LUE: Local measures with elevation for edema management/ heat prn.   16. R foot pain  6/11- con't pain meds- CT of R foot is OK.  17. LUQ pain-   6/11- KUB looks OK- con't to monitor 18. Insomnia  6/12- will increase trazodone to 100 mg QHS.     LOS: 3 days A FACE TO FACE EVALUATION WAS PERFORMED  Drema Pry Oleg Oleson 12/10/2020, 2:40 PM

## 2020-12-10 NOTE — Progress Notes (Signed)
Inpatient Rehabilitation  Patient information reviewed and entered into eRehab system by Comfort Iversen Sy Saintjean, OTR/L.   Information including medical coding, functional ability and quality indicators will be reviewed and updated through discharge.    

## 2020-12-10 NOTE — Progress Notes (Signed)
Occupational Therapy Session Note  Patient Details  Name: AMAURIE WANDEL MRN: [id number] Date of Birth: February 28, 1991  Today's Date: 12/10/2020 OT Individual Time: 0814-4818 OT Individual Time Calculation (min): 40 min  20 minutes missed  Short Term Goals: Week 1:  OT Short Term Goal 1 (Week 1): Patient will complete toilet transfer with CGA. OT Short Term Goal 2 (Week 1): Pt will don socks with min A OT Short Term Goal 3 (Week 1): Pt tolerate standing at the sink with B LEs evenly on the floor for 1 minute within grooming task  Skilled Therapeutic Interventions/Progress Updates:    Pt greeted in bed, reporting a lot of pain in his Rt foot, asking therapist to remove ACE wrapping as this was making pain worse. OT unwrapped ACE wraps. He was agreeable to shower. Noted a great deal of sensitivity in the Rt foot when OT assisted him with donning footwear. He ambulated with CGA using the RW into the bathroom, declining toileting and transferred to the TTB with Mod A and vcs for fully turning RW before sitting. He bathed at sit<stand level with min cues for sequencing. Provided him with shampoo and reminded him x2 which container it was in vs soap. He used the shampoo to wash UB. Pt able to wash half of the Rt foot, able to pull limb up towards torso. CGA during dynamic standing to complete pericare. Afterwards he dressed from TTB at sit<stand level using the grab bar. Pt able to thread both LEs into his pants and pull them up with just CGA! He did need assistance for bilateral footwear. CGA for ambulation out of the bathroom using device, pt still exhibiting little to no weightbearing on the Rt foot, when cued to increase weight pt refusing due to pain. He wanted to wait until later to complete oral care. Requesting to rest for now. Sat a few minutes EOB and reviewed basic orientation, pt oriented to situation but disoriented to time. Needed cues to use his calendar to assist him. He asked to return to bed  due to pain, deferring therapy until his next appointment. Left him with all needs within reach and bed alarm set.Time missed due to pt refusal/pain.   Therapy Documentation Precautions:  Precautions Precautions: Fall Precaution Comments: R-side weakness and lower leg pain Restrictions Weight Bearing Restrictions: No Other Position/Activity Restrictions: Pt scheduled for CT scan of R foot to r/o pathology, attempted to maintain NWB but pt unable to maintain, but appears TDWB only.   Pain: Per RN, pt not yet due for pain medicine. He declined ice pack at end of tx Pain Assessment Pain Scale: 0-10 Pain Score: 10-Worst pain ever Pain Type: Acute pain Pain Location: Foot Pain Orientation: Right Pain Descriptors / Indicators: Aching Pain Frequency: Constant Pain Onset: On-going Patients Stated Pain Goal: 5 Pain Intervention(s): Medication (See eMAR) ADL: ADL Eating: Set up Grooming: Minimal assistance Upper Body Bathing: Minimal assistance Lower Body Bathing: Moderate assistance Upper Body Dressing: Minimal assistance Lower Body Dressing: Moderate assistance Toileting: Moderate assistance Toilet Transfer: Minimal assistance Psychologist, counselling Transfer: Minimal assistance     Therapy/Group: Individual Therapy  Javoris Star A Tobechukwu Emmick 12/10/2020, 12:27 PM

## 2020-12-10 NOTE — IPOC Note (Signed)
Overall Plan of Care [facility]) Patient Details Name: ZACCAI CHAVARIN MRN: [id number] DOB: [birth year]  Admitting Diagnosis: Anoxic brain injury [facility])  Hospital Problems: Principal Problem:   Anoxic brain injury [facility])     Functional Problem List: Nursing Behavior, Endurance, Medication Management, Nutrition, Pain, Safety, Skin Integrity  PT Balance, Pain, Safety, Endurance, Motor  OT Balance, Behavior, Cognition, Endurance, Motor, Pain, Safety, Sensory  SLP Cognition  TR         Basic ADL's: OT Grooming, Bathing, Dressing, Toileting, Eating     Advanced  ADL's: OT       Transfers: PT Bed Mobility, Bed to Chair, Car, Occupational psychologist, Research scientist (life sciences): PT Ambulation, Psychologist, prison and probation services, Other (comment) (stairs)     Additional Impairments: OT Fuctional Use of Upper Extremity  SLP Social Cognition   Social Interaction, Problem Solving, Memory, Attention, Awareness  TR      Anticipated Outcomes Item Anticipated Outcome  Self Feeding Independent  Swallowing      Basic self-care  Mod I/supervision  Engineer, technical sales Transfers Supervision  Bowel/Bladder  N/A  Transfers  Mod I  Locomotion  supervision w/ LRAD  Communication     Cognition  Supervision  Pain  < 3  Safety/Judgment  Mod assist and no falls   Therapy Plan: PT Intensity: Minimum of 1-2 x/day ,45 to 90 minutes PT Frequency: 5 out of 7 days PT Duration Estimated Length of Stay: 10-12 days OT Intensity: Minimum of 1-2 x/day, 45 to 90 minutes OT Frequency: 5 out of 7 days OT Duration/Estimated Length of Stay: 10-12 days SLP Intensity: Minumum of 1-2 x/day, 30 to 90 minutes SLP Frequency: 3 to 5 out of 7 days SLP Duration/Estimated Length of Stay: 10-12 days   Due to the current state of emergency, patients may not be receiving their 3-hours of Medicare-mandated therapy.   Team Interventions: Nursing Interventions Patient/Family Education, Disease  Management/Prevention, Pain Management, Medication Management, Skin Care/Wound Management, Discharge Planning, Psychosocial Support  PT interventions Ambulation/gait training, Discharge planning, Functional mobility training, Therapeutic Activities, UE/LE Strength taining/ROM, Warden/ranger, Community reintegration, Neuromuscular re-education, Patient/family education, Museum/gallery curator, Therapeutic Exercise, UE/LE Coordination activities, Wheelchair propulsion/positioning  OT Interventions Warden/ranger, Cognitive remediation/compensation, Firefighter, Discharge planning, Disease mangement/prevention, Fish farm manager, Functional mobility training, Neuromuscular re-education, Pain management, Patient/family education, Functional electrical stimulation, Psychosocial support, Self Care/advanced ADL retraining, Splinting/orthotics, Skin care/wound managment, Therapeutic Activities, Therapeutic Exercise, UE/LE Strength taining/ROM, Visual/perceptual remediation/compensation, Wheelchair propulsion/positioning, UE/LE Coordination activities  SLP Interventions Cognitive remediation/compensation, Internal/external aids, Cueing hierarchy, Environmental controls, Therapeutic Activities, Functional tasks, Patient/family education  TR Interventions    SW/CM Interventions Discharge Planning, Psychosocial Support, Patient/Family Education   Barriers to Discharge MD  Medical stability  Nursing Decreased caregiver support, Home environment access/layout, Wound Care, Lack of/limited family support, Medication compliance, Behavior, Nutrition means Mobile home with 2 steps to enter.  PT Inaccessible home environment 2 STE, possible R foot pathology  OT      SLP      SW Decreased caregiver support, Lack of/limited family support     Team Discharge Planning: Destination: PT-Home ,OT- Home , SLP-Home Projected Follow-up: PT-Home health PT, OT-  Outpatient OT,  Home health OT (vs), SLP-24 hour supervision/assistance, Outpatient SLP, Home Health SLP Projected Equipment Needs: PT-To be determined, OT- Tub/shower seat, Rolling walker with 5" wheels, SLP-None recommended by SLP Equipment Details: PT-Pt may have RW at home., OT-  Patient/family involved in discharge planning: PT- Patient,  OT-Patient,  SLP-Patient  MD ELOS: 28-30 days modA Medical Rehab Prognosis:  Excellent Assessment: Mr. Searfoss is a 30 year old man admitted to CIR with functional deficits secondary to anoxic brain injury and rhabdomyolysis. 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 Active medical issues include insomnia, LUQ pain, R foot pain, superficial thrombosis of the LUE, hypokalemia, acute renal failure, aspiration PNA, hepatic encephalopathy requiring regular medical management.   See Team Conference Notes for weekly updates to the plan of care

## 2020-12-11 MED ORDER — TRAZODONE HCL 50 MG PO TABS: 50.0000 mg | ORAL_TABLET | Freq: Every evening | ORAL | Status: DC | PRN

## 2020-12-11 NOTE — Progress Notes (Signed)
Patient ID: Nathaniel Lynn, male   DOB: 1990-11-24, 30 y.o.   MRN: [id number] Team Conference Report to Patient/Family  Team Conference discussion was reviewed with the patient and caregiver, including goals, any changes in plan of care and target discharge date.  Patient and caregiver express understanding and are in agreement.  The patient has a target discharge date of 12/21/20.  Covering for primary SW, Auria   SW met with patient, introduced self, provided patient with updates. Patient complains of pain and still not sleeping. SW will continue to follow up  Dyanne Iha 12/11/2020, 1:57 PM

## 2020-12-11 NOTE — Patient Care Conference (Signed)
Inpatient RehabilitationTeam Conference and Plan of Care Update Date: 12/11/2020   Time: 10:43 AM    Patient Name: Nathaniel Lynn      Medical Record Number: [id number]  Date of Birth: [birth year] Sex: Male         Room/Bed: 4W12C/4W12C-02 Payor Info: Payor: /    Admit Date/Time:  12/07/2020  2:38 PM  Primary Diagnosis:  Anoxic brain injury [facility])  Hospital Problems: Principal Problem:   Anoxic brain injury [facility])    Expected Discharge Date: Expected Discharge Date: 12/21/20  Team Members Present: Physician leading conference: Dr. Sula Soda Care Coodinator Present: Lavera Guise, BSW;Julen Rubert Marlyne Beards, RN, BSN, CRRN Nurse Present: Kennyth Arnold, RN PT Present: Malachi Pro, PT OT Present: Kearney Hard, OT SLP Present: Feliberto Gottron, SLP PPS Coordinator present : Edson Snowball, PT     Current Status/Progress Goal Weekly Team Focus  Bowel/Bladder   Patient is continent of bowel/bladder  Patient will remain continent of bowel/bladder  Will assess qshift and PRN   Swallow/Nutrition/ Hydration             ADL's   Min A  Supervision  self-care retaining, pain management, general strengthening,   Mobility             Communication             Safety/Cognition/ Behavioral Observations  Mod A  Supervision  functional problem solving, sustained attention, recall and emergent awareness   Pain   Patient reports pain 10 out of 0-10  Patient's pain will be no more than 3 out of 0-10  Will assess qshift and PRN   Skin   Patient's skin is intact  Patient's skin will remain intact  Will assess qshift and PRN     Discharge Planning:  Pt is uninsured. Pt will d/c to home back with his parents.   Team Discussion: KUB stable. Continent B/B, 8/10 left foot pain. Slept all day, stayed up all night. Has scarring to the skin. Patient on target to meet rehab goals: Foot pain is limiting, min/mod assist for ADL transfers, min assist for ADL's. Foot pain limiting progress  with  PT. Cognition issues and poor memory.  *See Care Plan and progress notes for long and short-term goals.   Revisions to Treatment Plan:  Increased dose of Lyrica.  Teaching Needs: Family education, medication management, pain management, skin/wound care, transfer training, gait training, balance training, endurance training, safety awareness.  Current Barriers to Discharge: Decreased caregiver support, Medical stability, Home enviroment access/layout, Wound care, Lack of/limited family support, Weight bearing restrictions, Medication compliance, and Behavior  Possible Resolutions to Barriers: Continue current medications, provide emotional support.     Medical Summary Current Status: R foot pain, insomnia, LUQ pain, superficial thrombosis of LUE, impaired memory  Barriers to Discharge: Medical stability;Behavior  Barriers to Discharge Comments: R foot pain, insomnia, LUQ pain, superficial thrombosis of LUE, polysubstance abuse Possible Resolutions to [employer]: continue increased dose of Lyrica, CT of right foot stable, KUB of LUQ stable, apply heat and elevate upper extremity, substance abuse counseling   Continued Need for Acute Rehabilitation Level of Care: The patient requires daily medical management by a physician with specialized training in physical medicine and rehabilitation for the following reasons: Direction of a multidisciplinary physical rehabilitation program to maximize functional independence : Yes Medical management of patient stability for increased activity during participation in an intensive rehabilitation regime.: Yes Analysis of laboratory values and/or radiology reports with any subsequent need for medication adjustment  and/or medical intervention. : Yes   I attest that I was present, lead the team conference, and concur with the assessment and plan of the team.   Tennis Must 12/11/2020, 5:17 PM

## 2020-12-11 NOTE — Progress Notes (Signed)
Occupational Therapy Session Note  Patient Details  Name: Nathaniel Lynn MRN: [id number] Date of Birth: [birth year]  Today's Date: 12/11/2020 OT Individual Time: 1100-1200 OT Individual Time Calculation (min): 60 min  and Today's Date: 12/11/2020 OT Missed Time: 30 Minutes Missed Time Reason: Pain;Patient fatigue  Short Term Goals: Week 1:  OT Short Term Goal 1 (Week 1): Patient will complete toilet transfer with CGA. OT Short Term Goal 2 (Week 1): Pt will don socks with min A OT Short Term Goal 3 (Week 1): Pt tolerate standing at the sink with B LEs evenly on the floor for 1 minute within grooming task  Skilled Therapeutic Interventions/Progress Updates:  OT attempted to see patient for earlier session. Pt asleep in bed with covers overhead. Pt able to wake briefly, but requested to sleep more due to R LE pain and fatigue. Pt agreeable to get up at next session. Pt greeted semi-reclined in bed asleep and agreeable to OT treatment session. PT completed bed mobility with increased time 2/2 R LE pain. 9/10 pain initiatlly. Pt unable to tolerate any weight through R LE when ambulating into bathroom w/ RW and min A. Educated on energy conservation techniques and modified strategies for doffing clothes. Shower completed with min A to wash R LE and lateral leans to wash buttocks. Min A to ambulate out of bathroom with verbal cues for safety awareness and proximity to bed prior to sitting. Min A for LB dressing with hypersensitivity to R LE. Pt with LOB standing to pull up pants requiring min A to correct. Standing balance/endurance with standing grooming tasks. Educated on RW placement and balance strategies with min A. Pt propelled wc to therapy gym and worked on UB there ex- 3 sets of 10 chest press, bicep curl, straight arm raise and seated row-4lb dowel rod. Pt returned to room and transferred back to bed CGA squat-pivot. Bed alarm on, call bell in reach, and needs met.   Therapy  Documentation Precautions:  Precautions Precautions: Fall Precaution Comments: R-side weakness and lower leg pain Restrictions Weight Bearing Restrictions: No Other Position/Activity Restrictions: Pt scheduled for CT scan of R foot to r/o pathology, attempted to maintain NWB but pt unable to maintain, but appears TDWB only. Pain: Pain Assessment Pain Scale: 0-10 Pain Score: 9/10 Pain to R LE, difficulty weightbearing, rest and repositioned for comfort   Therapy/Group: Individual Therapy  Valma Cava 12/11/2020, 11:42 AM

## 2020-12-11 NOTE — Progress Notes (Signed)
Physical Therapy Session Note  Patient Details  Name: Nathaniel Lynn MRN: [id number] Date of Birth: 1991/01/26  Today's Date: 12/11/2020 PT Individual Time: 1517-6160 PT Individual Time Calculation (min): 30 min   Short Term Goals: Week 1:  PT Short Term Goal 1 (Week 1): Pt will transfer sit to stand w/ CGA w/ LRAD. PT Short Term Goal 2 (Week 1): Pt will transfer bed <> w/c w/ CGA. PT Short Term Goal 3 (Week 1): Pt will amb 10' w/ LRAD and CGA.  Skilled Therapeutic Interventions/Progress Updates:    Pain:  Pt reports R "foot"  pain.  Treatment to tolerance.  Rest breaks and repositioning as needed.  Pt initially sleeping, awakens fairly easily, appears agitated c/o pain and thirst.  Agreed to assit pt w/needs.  Supine to sit w/supervision.   Squat pivot to wc w/max cues for safety.  R Wc legrest failing to lock into place, mult efforts to adjust/rposition not successful.  Pt very agitated due to R pain.  Pt redirected by offer to obtain coffee and new wc, current wc seating/positioning not ideal due to pt height. Pt propels wc x 81ft but pt again agitated by wc veering to R.  Therapist transports to nurses station and coffee provided.  Pt agitation decreased following this.  Pt then provided w/replacement wc w/functioning parts and much improved seating achieved.  Pt then propels wc 267ft w/bilat Ues, continues to veer L and instructed w/overcompensating w/increased LUE effort, difficulty attending to task but less agitation. Pt wc to bed w/max cues for safety. Pt left supine w/alarm set and needs in reach.     Therapy Documentation Precautions:  Precautions Precautions: Fall Precaution Comments: R-side weakness and lower leg pain Restrictions Weight Bearing Restrictions: No Other Position/Activity Restrictions: Pt scheduled for CT scan of R foot to r/o pathology, attempted to maintain NWB but pt unable to maintain, but appears TDWB only.    Therapy/Group: Individual  Therapy  Shearon Balo 12/11/2020, 1:49 PM

## 2020-12-11 NOTE — Progress Notes (Signed)
PROGRESS NOTE   Subjective/Complaints: Right foot pain improved with increase Lyrica but he continues to ask for stronger pain medication. No other complaints.    ROS: +right foot pain  Objective:   No results found. Recent Labs    12/10/20 0548  WBC 6.4  HGB 11.6*  HCT 33.4*  PLT 370   Recent Labs    12/10/20 0548  NA 135  K 3.5  CL 98  CO2 28  GLUCOSE 111*  BUN 7  CREATININE 0.86  CALCIUM 9.2    Intake/Output Summary (Last 24 hours) at 12/11/2020 1310 Last data filed at 12/10/2020 1812 Gross per 24 hour  Intake 120 ml  Output --  Net 120 ml        Physical Exam: Vital Signs Blood pressure (!) 153/105, pulse (!) 107, temperature 98.2 F (36.8 C), temperature source Oral, resp. rate 18, height 5\' 10"  (1.778 m), weight 75.3 kg, SpO2 99 %.  Gen: no distress, normal appearing HEENT: oral mucosa pink and moist, NCAT Cardio: Tachycardia Chest: normal effort, normal rate of breathing Abd: soft, non-distended Ext: no edema Psych: pleasant, normal affect  Musculoskeletal:        General: Tenderness (RLE) present. Esp over dorsum of R foot.     Cervical back: Normal range of motion. Skin:    General: Skin is warm.    Comments: Multiple healed scars from old dog bites on RLE.   Neurological:    Comments: Speech clear but impulsive with poor safety awareness. Distracted. No focal CN findings. UE motor 5/5. LLE 4-5/5. RLE limited distally d/t pain. Pt with sensory loss below knee predominantly in the anterior leg and foot although he was diminished posteriorly in the calf, sole of foot as well. Additional sensory loss in the posterior thigh.  Significant RLE weakness with right foot drop and dysesthesias right calf/shin. KE appears to at least 3/5 but difficult to test d/t pain with resistance against lower leg. KF 2-3/5 but difficult to test d/t pain. Pt has 5/5 HAD and HAB. 0/5 ADF and 2/5 APF. DTR's 1+ to  absent      Assessment/Plan: 1. Functional deficits which require 3+ hours per day of interdisciplinary therapy in a comprehensive inpatient rehab setting. Physiatrist is providing close team supervision and 24 hour management of active medical problems listed below. Physiatrist and rehab team continue to assess barriers to discharge/monitor patient progress toward functional and medical goals  Care Tool:  Bathing    Body parts bathed by patient: Right arm, Left arm, Chest, Abdomen, Front perineal area, Buttocks, Right upper leg, Left upper leg, Face, Left lower leg   Body parts bathed by helper: Right lower leg     Bathing assist Assist Level: Minimal Assistance - Patient > 75%     Upper Body Dressing/Undressing Upper body dressing   What is the patient wearing?: Pull over shirt    Upper body assist Assist Level: Supervision/Verbal cueing    Lower Body Dressing/Undressing Lower body dressing      What is the patient wearing?: Pants, Underwear/pull up     Lower body assist Assist for lower body dressing: Contact Guard/Touching assist  Toileting Toileting    Toileting assist Assist for toileting: Contact Guard/Touching assist     Transfers Chair/bed transfer  Transfers assist     Chair/bed transfer assist level: Minimal Assistance - Patient > 75%     Locomotion Ambulation   Ambulation assist   Ambulation activity did not occur: Safety/medical concerns (pt scheduled for CT scan R foot r/o pathology)          Walk 10 feet activity   Assist           Walk 50 feet activity   Assist           Walk 150 feet activity   Assist           Walk 10 feet on uneven surface  activity   Assist Walk 10 feet on uneven surfaces activity did not occur: Safety/medical concerns         Wheelchair     Assist Will patient use wheelchair at discharge?: No Type of Wheelchair: Manual    Wheelchair assist level: Moderate Assistance -  Patient 50 - 74% Max wheelchair distance: 150    Wheelchair 50 feet with 2 turns activity    Assist        Assist Level: Moderate Assistance - Patient 50 - 74%   Wheelchair 150 feet activity     Assist      Assist Level: Moderate Assistance - Patient 50 - 74%   Blood pressure (!) 153/105, pulse (!) 107, temperature 98.2 F (36.8 C), temperature source Oral, resp. rate 18, height 5\' 10"  (1.778 m), weight 75.3 kg, SpO2 99 %.  Medical Problem List and Plan: 1.   Functional deficits secondary to anoxic brain injury and rhabdomyolysis. 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             -patient may shower             -ELOS/Goals: 28-30 days, mod assist goals with PT, OT, SLP  -Continue PT, OT and SLP  -Interdisciplinary Team Conference today   2.  Impaired mobility: -DVT/anticoagulation:  Pharmaceutical: d/c Lovenox             -antiplatelet therapy:  N/a 3. Right lower extremity dysesthetic pain:              -pt denied any back pain today and there was none one exam either. Exam most consistent with sciatic nerve injury in the proximal right thigh which is entirely possible given his rhabdomyolysis/down-time. I don't think it's related to his ABI either. Continue Oxycodone BID with tylenol prn.              -begin lyrica for RLE   6/11- pt c/o LUQ pain and R foot pain- con't regimen- R foot CT and KUB OK. . 6/13- increase Lyrica to 50mg  given right foot pain still severe 6/14: pain improved with increase in Lyrica, advised patient it may take a few days to fully get into his system 4. Mood:  LCSW to follow for evaluation and support.              -antipsychotic agents: N/A 5. Neuropsych: This patient is not fully capable of making decisions on his own behalf. 6. Skin/Wound Care: Pressure relief measures. Will add juven for supplement (tastes better than prosource)             --Will add vitamin C and Zinc. 7. Fluids/Electrolytes/Nutrition:  Monitor I/O. Stable 6/13, monitor weekly 8. Polysubstance  abuse/Hypoxic encephalopathy: Has completed clonidine taper to help with withdrawal             --Oxycodone has been weaned down to 5 mg bid as off on 05/30.               --Seroquel 100 mg hs/50 mg daily. Monitor sleep wake cycle.             --Continue thiamine and folic acid for supplement.             --posturing due to ABI/no seizures documented.   9. Aspiration PNA: Completed IV Vanc on 05/20. Respiratory status stable 10. Acute blood loss anemia: Will continue to monitor for signs of bleeding. --is recovering, stable, repeat Hgb weekly 11. Acute renal failure: Due to rhabdomyolysis has resolved with improvement in K-159 12. Blood pressure: Intermittent elevation noted.             --continue to monitor for now 13. Hypokalemia: Was supplemented. Po intake reported to be poor and refusing supplements.             --recheck potassium level in am.  6/12- K+ borderline low at 3.5, stable on 6/13 14. Thrombocytosis: Slowly trending down 15. Superficial thrombosis LUE: Local measures with elevation for edema management/ heat prn.   16. R foot pain  6/11- con't pain meds- CT of R foot is OK.  17. LUQ pain-   6/11- KUB looks OK- con't to monitor 18. Insomnia  6/12- will increase trazodone to 100 mg QHS.     LOS: 4 days A FACE TO FACE EVALUATION WAS PERFORMED  Stephens Shreve P Gregori Abril 12/11/2020, 1:10 PM

## 2020-12-11 NOTE — Progress Notes (Signed)
Speech Language Pathology Daily Session Note  Patient Details  Name: Nathaniel Lynn MRN: [id number] Date of Birth: [birth year]  Today's Date: 12/11/2020 SLP Individual Time: 1430-1500 SLP Individual Time Calculation (min): 30 min  Short Term Goals: Week 1: SLP Short Term Goal 1 (Week 1): Patient will demonstrate functional problem solving for mildly complex tasks with Min verbal cues for. SLP Short Term Goal 2 (Week 1): Patient will recall new, daily information with Min verbal and visual cues. SLP Short Term Goal 3 (Week 1): Patient will demonstrate selective attention to functional tasks in a mildly distracting enviornment for 45 minutes with Min verbal cues for redirection. SLP Short Term Goal 4 (Week 1): Patient will self-monitor and correct errors during functional tasks with Min verbal cues.  Skilled Therapeutic Interventions:   SLP treatment performed with focus on cognitive goals. Patient was more attentive during today's session as result of less R foot discomfort. Demonstrated sustained attention during entirety of 30 minute session a quiet environment with minimal distractions. SLP facilitated medication management task. Patient accurately organized 11/13 medications (84% accuracy) in am/noon/pm pillbox with Min A verbal cues. He was able to correctly identify and resolve errors during both occasions following initial prompt from therapist to re-read prescription instructions and double check pillbox for accuracy. Required reminders to recall which medications he had already placed in pillbox. Patient responded to medication problem solving scenarios with Min verbal cues. SLP educated on organization and compensatory memory strategies to assist with successful medication management. Patient was able to teach back to therapist by conclusion of session. Patient was left in bed with alarm activated and all needs within reach. Continue current plan of care.  Pain Pain Assessment Pain Scale:  0-10 Pain Score: 0-No pain  Therapy/Group: Individual Therapy  Tamala Ser 12/11/2020, 3:29 PM

## 2020-12-11 NOTE — Progress Notes (Signed)
Physical Therapy Session Note  Patient Details  Name: Nathaniel Lynn MRN: [id number] Date of Birth: 10/03/1990  Today's Date: 12/11/2020 PT Individual Time: 9798-9211 PT Individual Time Calculation (min): 54 min   Short Term Goals: Week 1:  PT Short Term Goal 1 (Week 1): Pt will transfer sit to stand w/ CGA w/ LRAD. PT Short Term Goal 2 (Week 1): Pt will transfer bed <> w/c w/ CGA. PT Short Term Goal 3 (Week 1): Pt will amb 44' w/ LRAD and CGA.  Skilled Therapeutic Interventions/Progress Updates:     Pt received supine in bed and agrees to therapy. Initially no report of pain. Supine to sit with bed features. PT assists pt to thread pants over each leg and don socks. Sit to stand with minA HHA for pt to pull up pants. Pt is unable to bear nearly any weight through R foot due to pain, burning and tingling in nature. Number not provided. PT provides rest breaks, education, repositioning to address pain. Stand step transfer from bed to [facility] with RW and minA. WC transport to gym for time management. Pt transfers to mat table with CGA. Pt ambulates x20' with RW and minA, but is unable to bear weight through R foot due to pain, and arms fatigue quickly. Pt transitions to parallel bars for balance and strengthening. Sit to stand multiple times in parallel bars with CGA. Pt performs triceps extensions to clear L foot in parallel bars, 2x20. PT cues pt to attempt allowing elbows to flex more to increase range of strengthening but pt says he feels that he is too weak to do this. Pt also acclimates R foot to WB, allowing foot to come to full foot-flat position on ground and attempting minimal WB. PT educates on stretch in posterior calf and potential benefits for pain symptoms. WC transport back to room. Stand step to bed with CGA. Left supine with alarm intact and all needs within reach.  Therapy Documentation Precautions:  Precautions Precautions: Fall Precaution Comments: R-side weakness and lower leg  pain Restrictions Weight Bearing Restrictions: No Other Position/Activity Restrictions: Pt scheduled for CT scan of R foot to r/o pathology, attempted to maintain NWB but pt unable to maintain, but appears TDWB only.   Therapy/Group: Individual Therapy  Beau Fanny, PT, DPT 12/11/2020, 12:53 PM

## 2020-12-12 DIAGNOSIS — S7401XS Injury of sciatic nerve at hip and thigh level, right leg, sequela: Secondary | ICD-10-CM

## 2020-12-12 NOTE — Progress Notes (Signed)
Physical Therapy Session Note  Patient Details  Name: Nathaniel Lynn MRN: [id number] Date of Birth: December 04, 1990  Today's Date: 12/12/2020 PT Individual Time: 1100-1115 PT Individual Time Calculation (min): 15 min  and Today's Date: 12/12/2020 PT Missed Time: 45 Minutes Missed Time Reason: Patient unwilling to participate  Short Term Goals: Week 1:  PT Short Term Goal 1 (Week 1): Pt will transfer sit to stand w/ CGA w/ LRAD. PT Short Term Goal 2 (Week 1): Pt will transfer bed <> w/c w/ CGA. PT Short Term Goal 3 (Week 1): Pt will amb 1' w/ LRAD and CGA.  Skilled Therapeutic Interventions/Progress Updates:    Patient received supine in bed, covers over face, turned away from therapist. When PT introduced herself, patient initially acknowledged therapist, then turned back over and covered himself with blankets again. PT attempting to encourage patient to participate in therapy. Discussed pain management of R LE and offered options to patient, however he remained not responsive to PT and with covers over his head. PT asked if he had pain now and offered to alert RN if rx was due, but patient did not respond. PT unable to redirect patient toward therapeutic tasks. Bed alarm on, call light within reach.   Therapy Documentation Precautions:  Precautions Precautions: Fall Precaution Comments: R-side weakness and lower leg pain Restrictions Weight Bearing Restrictions: No Other Position/Activity Restrictions: Pt scheduled for CT scan of R foot to r/o pathology, attempted to maintain NWB but pt unable to maintain, but appears TDWB only.   Therapy/Group: Individual Therapy  Elizebeth Koller, PT, DPT, CBIS  12/12/2020, 7:42 AM

## 2020-12-12 NOTE — Progress Notes (Signed)
Occupational Therapy Session Note  Patient Details  Name: Nathaniel Lynn MRN: [id number] Date of Birth: March 28, 1991  Today's Date: 12/12/2020 OT Individual Time: 4098-1191 OT Individual Time Calculation (min): 55 min    Short Term Goals: Week 1:  OT Short Term Goal 1 (Week 1): Patient will complete toilet transfer with CGA. OT Short Term Goal 2 (Week 1): Pt will don socks with min A OT Short Term Goal 3 (Week 1): Pt tolerate standing at the sink with B LEs evenly on the floor for 1 minute within grooming task  Skilled Therapeutic Interventions/Progress Updates:    Pt received supine with high amounts of nerve pain in his R foot, even at rest, but most aggravated with weightbearing and any tactile input. Pt requested shower for pain management. Pt completed bed mobility with mod I. He used RW for ambulatory transfer into the bathroom with CGA, he had RUE in full NWB position during transfer. Pt required min cueing for sequencing transfer into shower and doffing clothing seated. He reported high increase in pain with doffing socks and pants over RLE. He reported that the warm water of the shower was a great pain relief. He was able to complete all bathing at supervision level. Dressing at [facility] for standing level pulling up of pants. Pt returned to EOB. Trialed warm and cold foot soak for pain relief. Both were successful at reducing nerve pain and pt was able to tolerate deep and light touch with a report of pain no higher than 3/10. Discussed need to continue desensitization techniques. Provided gentle PROM to R ankle. Pt transferred to w/c and was taken to the therapy gym. Completed BUE strengthening circuit with 10 lb dumbbells- shoulder press, bicep curls, and lateral raises with min cueing for technique and demo provided throughout. Pt returned to his room and was left supine with all needs met, bed alarm set.   Therapy Documentation Precautions:  Precautions Precautions: Fall Precaution  Comments: R-side weakness and lower leg pain Restrictions Weight Bearing Restrictions: No Other Position/Activity Restrictions: Pt scheduled for CT scan of R foot to r/o pathology, attempted to maintain NWB but pt unable to maintain, but appears TDWB only.   Therapy/Group: Individual Therapy  Curtis Sites 12/12/2020, 7:25 AM

## 2020-12-12 NOTE — Progress Notes (Signed)
Speech Language Pathology Daily Session Note  Patient Details  Name: Nathaniel Lynn MRN: [id number] Date of Birth: 04-Dec-1990  Today's Date: 12/12/2020 SLP Individual Time: 1345-1430 SLP Individual Time Calculation (min): 45 min  Short Term Goals: Week 1: SLP Short Term Goal 1 (Week 1): Patient will demonstrate functional problem solving for mildly complex tasks with Min verbal cues for. SLP Short Term Goal 2 (Week 1): Patient will recall new, daily information with Min verbal and visual cues. SLP Short Term Goal 3 (Week 1): Patient will demonstrate selective attention to functional tasks in a mildly distracting enviornment for 45 minutes with Min verbal cues for redirection. SLP Short Term Goal 4 (Week 1): Patient will self-monitor and correct errors during functional tasks with Min verbal cues.  Skilled Therapeutic Interventions: Skilled treatment session focused on cognitive goals. SLP facilitated session by providing total A for recall of events from previous therapy sessions. Patient was addiment that he did not do anything this morning and all of the events from his therapy notes happened yesterday. Patient also demanded to see the clinician to confirm events. Therefore, SLP initiated the use of a memory notebook for therapists to record events to maximize recall and carryover of information. Patient also participated in a basic money management task and required Min verbal cues for problem solving and to reduce impulsivity throughout task. Patient was distracted by pain throughout session and required Mod verbal cues for redirection. Patient left upright in bed with alarm on and all needs within reach. Continue with current plan of care.      Pain Pain Assessment Pain Scale: 0-10 Pain Score: 5  Pain Type: Acute pain Pain Location: Foot Pain Orientation: Right Pain Descriptors / Indicators: Pins and needles Pain Onset: On-going Pain Intervention(s): Medication (See  eMAR)  Therapy/Group: Individual Therapy  Kerryann Allaire 12/12/2020, 2:51 PM

## 2020-12-12 NOTE — Progress Notes (Signed)
Physical Therapy Session Note  Patient Details  Name: Nathaniel Lynn MRN: [id number] Date of Birth: 28-Jan-1991  Today's Date: 12/12/2020 Treatment time  8:00-8:30 Total treatement 30 min Once in wc, pt propels to gym for continued session.  WC PROpulsion x 134ft w/bilat Ues and LLE w/supervision, minimal cueing for use of foot to steer due to tendency to veer to L.  Attempted standing w/R foot on foam wedge for stretching and desensitization purposes but not tolerated.  In sitting placed foot on foam wedge, initially only tolerated in approx 30degrees plantarflexion and footmildly inverted, heel not contacting surface.   Very gradually backed wedge increasing DF and inversion by small increments to full plantar contact neutral inv/eversion of forefoot, and approx 5 degress of PF.  Discussed use of footdrop splint for desensitization/positioning/prevention of contracture.  Pt agreed to try.  Delle Reining, PA agreed to order for pt.   Wc propulsion x 166ft to room.  stand pivot transfer wc to bed w/cga, cues for safe set up, improved safety w/performance today.  Sit to supine w/supervision. Pt left supine w/rails up x 3, alarm set, bed in lowest position, and needs in reach.     Therapy Documentation Precautions:  Precautions Precautions: Fall Precaution Comments: R-side weakness and lower leg pain Restrictions Weight Bearing Restrictions: No Other Position/Activity Restrictions: Pt scheduled for CT scan of R foot to r/o pathology, attempted to maintain NWB but pt unable to maintain, but appears TDWB only.    Therapy/Group: Individual Therapy Rada Hay, PT   Shearon Balo 12/12/2020, 4:32 PM

## 2020-12-12 NOTE — Progress Notes (Signed)
PROGRESS NOTE   Subjective/Complaints: Still having right foot pain and weakness which affects him functionally.   ROS: Patient denies fever, rash, sore throat, blurred vision, nausea, vomiting, diarrhea, cough, shortness of breath or chest pain,   headache, or mood change.   Objective:   No results found. Recent Labs    12/10/20 0548  WBC 6.4  HGB 11.6*  HCT 33.4*  PLT 370   Recent Labs    12/10/20 0548  NA 135  K 3.5  CL 98  CO2 28  GLUCOSE 111*  BUN 7  CREATININE 0.86  CALCIUM 9.2    Intake/Output Summary (Last 24 hours) at 12/12/2020 0903 Last data filed at 12/11/2020 1725 Gross per 24 hour  Intake 480 ml  Output --  Net 480 ml        Physical Exam: Vital Signs Blood pressure (!) 158/96, pulse (!) 110, temperature 98.2 F (36.8 C), temperature source Oral, resp. rate 18, height 5\' 10"  (1.778 m), weight 75.3 kg, SpO2 99 %.  Constitutional: No distress . Vital signs reviewed. HEENT: EOMI, oral membranes moist Neck: supple Cardiovascular: RRR without murmur. No JVD    Respiratory/Chest: CTA Bilaterally without wheezes or rales. Normal effort    GI/Abdomen: BS +, non-tender, non-distended Ext: no clubbing, cyanosis, or edema Psych: pleasant a little restless Musculoskeletal:        General: Tenderness (RLE) present. Esp over dorsum of R foot.     Cervical back: Normal range of motion. Skin:    General: Skin is warm.    Comments: Multiple healed scars from old dog bites on RLE.   Neurological:    Comments: Speech clear but impulsive with poor safety awareness. Distracted. No focal CN findings. UE motor 5/5. LLE 4-5/5. RLE limited distally d/t pain. Pt with sensory loss in rgiht leg and foot. Has weakness in sciatic muscles, esp peroneal div. Pt has 5/5 HAD and HAB. 0/5 ADF and 2/5 APF. DTR's 1+ to absent      Assessment/Plan: 1. Functional deficits which require 3+ hours per day of  interdisciplinary therapy in a comprehensive inpatient rehab setting. Physiatrist is providing close team supervision and 24 hour management of active medical problems listed below. Physiatrist and rehab team continue to assess barriers to discharge/monitor patient progress toward functional and medical goals  Care Tool:  Bathing    Body parts bathed by patient: Right arm, Left arm, Chest, Abdomen, Front perineal area, Buttocks, Right upper leg, Left upper leg, Face, Left lower leg   Body parts bathed by helper: Right lower leg     Bathing assist Assist Level: Minimal Assistance - Patient > 75%     Upper Body Dressing/Undressing Upper body dressing   What is the patient wearing?: Pull over shirt    Upper body assist Assist Level: Supervision/Verbal cueing    Lower Body Dressing/Undressing Lower body dressing      What is the patient wearing?: Pants, Underwear/pull up     Lower body assist Assist for lower body dressing: Contact Guard/Touching assist     Toileting Toileting    Toileting assist Assist for toileting: Contact Guard/Touching assist     Transfers Chair/bed transfer  Transfers assist     Chair/bed transfer assist level: Minimal Assistance - Patient > 75%     Locomotion Ambulation   Ambulation assist   Ambulation activity did not occur: Safety/medical concerns (pt scheduled for CT scan R foot r/o pathology)          Walk 10 feet activity   Assist           Walk 50 feet activity   Assist           Walk 150 feet activity   Assist           Walk 10 feet on uneven surface  activity   Assist Walk 10 feet on uneven surfaces activity did not occur: Safety/medical concerns         Wheelchair     Assist Will patient use wheelchair at discharge?: No Type of Wheelchair: Manual    Wheelchair assist level: Moderate Assistance - Patient 50 - 74% Max wheelchair distance: 150    Wheelchair 50 feet with 2 turns  activity    Assist        Assist Level: Moderate Assistance - Patient 50 - 74%   Wheelchair 150 feet activity     Assist      Assist Level: Moderate Assistance - Patient 50 - 74%   Blood pressure (!) 158/96, pulse (!) 110, temperature 98.2 F (36.8 C), temperature source Oral, resp. rate 18, height 5\' 10"  (1.778 m), weight 75.3 kg, SpO2 99 %.  Medical Problem List and Plan: 1.   Functional deficits secondary to anoxic brain injury and rhabdomyolysis. 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             -patient may shower             -ELOS/Goals: 28-30 days, mod assist goals with PT, OT, SLP  -Continue CIR therapies including PT, OT, and SLP   -will need AFO potentially for RLE 2.  Impaired mobility: -DVT/anticoagulation:  Pharmaceutical: d/c Lovenox             -antiplatelet therapy:  N/a 3. Right lower extremity dysesthetic pain:              -pt denied any back pain today and there was none one exam either. Exam most consistent with sciatic nerve injury in the proximal right thigh which is entirely possible given his rhabdomyolysis/down-time. I don't think it's related to his ABI either. Continue Oxycodone BID with tylenol prn.              -begin lyrica for RLE   6/11- pt c/o LUQ pain and R foot pain- con't regimen- R foot CT and KUB OK. . 6/13- increase Lyrica to 50mg  given right foot pain still severe 6/15: pain improved with increase in Lyrica, adjust as necessary 4. Mood:  LCSW to follow for evaluation and support.              -antipsychotic agents: N/A 5. Neuropsych: This patient is not fully capable of making decisions on his own behalf. 6. Skin/Wound Care: Pressure relief measures. Will add juven for supplement (tastes better than prosource)             --added vitamin C and Zinc. 7. Fluids/Electrolytes/Nutrition: Monitor I/O. Stable 6/13, monitor weekly 8. Polysubstance abuse/Hypoxic encephalopathy: Has completed clonidine taper to  help with withdrawal             --Oxycodone has been weaned down to 5  mg bid as off on 05/30.               --Seroquel 100 mg hs/50 mg daily. Monitor sleep wake cycle.             --Continue thiamine and folic acid for supplement.             --posturing due to ABI/no seizures documented.   9. Aspiration PNA: Completed IV Vanc on 05/20. Respiratory status stable 10. Acute blood loss anemia: Will continue to monitor for signs of bleeding. --is recovering, stable, repeat Hgb weekly 11. Acute renal failure: Due to rhabdomyolysis has resolved with improvement in K-159 12. Blood pressure: Intermittent elevation noted.             --continue to monitor for now 13. Hypokalemia: Was supplemented. Po intake reported to be poor and refusing supplements.             --continue supp 14. Thrombocytosis: Slowly trending down 15. Superficial thrombosis LUE: Local measures with elevation for edema management/ heat prn.   16. R foot pain  6/11- con't pain meds- CT of R foot is OK.  17. LUQ pain-   6/11- KUB looks OK- con't to monitor 18. Insomnia  6/12-   trazodone 100 mg QHS. With improvement    LOS: 5 days A FACE TO FACE EVALUATION WAS PERFORMED  Ranelle Oyster 12/12/2020, 9:03 AM

## 2020-12-13 MED ORDER — PREGABALIN 75 MG PO CAPS
75.0000 mg | ORAL_CAPSULE | Freq: Three times a day (TID) | ORAL | Status: DC
  Administered 2020-12-13 – 2020-12-15 (×7): 75 mg via ORAL
  Filled 2020-12-13 (×7): qty 1

## 2020-12-13 NOTE — Plan of Care (Signed)
  Problem: RH Problem Solving Goal: LTG Patient will demonstrate problem solving for (SLP) Description: LTG:  Patient will demonstrate problem solving for basic/complex daily situations with cues  (SLP) 12/13/2020 0630 by Huston Foley, CCC-SLP Flowsheets (Taken 12/13/2020 0630) LTG Patient will demonstrate problem solving for: Minimal Assistance - Patient > 75% Note: Downgraded due to slow progress 12/13/2020 0629 by Huston Foley, CCC-SLP Flowsheets (Taken 12/13/2020 [phone]) LTG Patient will demonstrate problem solving for: Minimal Assistance - Patient > 75% Note: Downgraded due to slow progress   Problem: RH Memory Goal: LTG Patient will demonstrate ability for day to day (SLP) Description: LTG:   Patient will demonstrate ability for day to day recall/carryover during cognitive/linguistic activities with assist  (SLP) 12/13/2020 0630 by Huston Foley, CCC-SLP Flowsheets (Taken 12/13/2020 0630) LTG: Patient will demonstrate ability for day to day recall/carryover during cognitive/linguistic activities with assist (SLP): Minimal Assistance - Patient > 75% Note: Downgraded due to slow progress 12/13/2020 0629 by Huston Foley, CCC-SLP Flowsheets (Taken 12/13/2020 [phone]) LTG: Patient will demonstrate ability for day to day recall/carryover during cognitive/linguistic activities with assist (SLP): Minimal Assistance - Patient > 75% Note: Downgraded due to slow progress Goal: LTG Patient will use memory compensatory aids to (SLP) Description: LTG:  Patient will use memory compensatory aids to recall biographical/new, daily complex information with cues (SLP) 12/13/2020 0630 by Huston Foley, CCC-SLP Flowsheets (Taken 12/13/2020 0630) LTG: Patient will use memory compensatory aids to (SLP): Minimal Assistance - Patient > 75% Note: Downgraded due to slow progress 12/13/2020 0629 by Huston Foley, CCC-SLP Flowsheets (Taken 12/13/2020 [phone]) LTG: Patient will use memory compensatory  aids to (SLP): Minimal Assistance - Patient > 75% Note: Downgraded due to slow progress   Problem: RH Attention Goal: LTG Patient will demonstrate this level of attention during functional activites (SLP) Description: LTG:  Patient will will demonstrate this level of attention during functional activites (SLP) 12/13/2020 0630 by Huston Foley, CCC-SLP Flowsheets (Taken 12/13/2020 0630) Patient will demonstrate during cognitive/linguistic activities the attention type of: Sustained LTG: Patient will demonstrate this level of attention during cognitive/linguistic activities with assistance of (SLP): Minimal Assistance - Patient > 75% Number of minutes patient will demonstrate attention during cognitive/linguistic activities: 30 Note: Downgraded due to slow progress 12/13/2020 0629 by Huston Foley, CCC-SLP Flowsheets (Taken 12/13/2020 [phone]) Patient will demonstrate during cognitive/linguistic activities the attention type of: Sustained LTG: Patient will demonstrate this level of attention during cognitive/linguistic activities with assistance of (SLP): Minimal Assistance - Patient > 75% Number of minutes patient will demonstrate attention during cognitive/linguistic activities: 30 Note: Downgraded due to slow progress   Problem: RH Awareness Goal: LTG: Patient will demonstrate awareness during functional activites type of (SLP) Description: LTG: Patient will demonstrate awareness during functional activites type of (SLP) 12/13/2020 0630 by Huston Foley, CCC-SLP Flowsheets (Taken 12/13/2020 0630) LTG: Patient will demonstrate awareness during cognitive/linguistic activities with assistance of (SLP): Minimal Assistance - Patient > 75% Note: Downgraded due to slow progress 12/13/2020 0629 by Huston Foley, CCC-SLP Flowsheets (Taken 12/13/2020 [phone]) LTG: Patient will demonstrate awareness during cognitive/linguistic activities with assistance of (SLP): Minimal Assistance - Patient >  75% Note: Downgraded due to slow progress

## 2020-12-13 NOTE — Progress Notes (Signed)
Speech Language Pathology Weekly Progress and Session Note  Patient Details  Name: Nathaniel Lynn MRN: [id number] Date of Birth: [birth year]  Beginning of progress report period: December 07, 2020 End of progress report period: December 13, 2020  Today's Date: 12/13/2020 SLP Missed Time: 60 Minutes Missed Time Reason: Pain;Patient fatigue  Short Term Goals: Week 1: SLP Short Term Goal 1 (Week 1): Patient will demonstrate functional problem solving for mildly complex tasks with Min verbal cues for. SLP Short Term Goal 1 - Progress (Week 1): Not met SLP Short Term Goal 2 (Week 1): Patient will recall new, daily information with Min verbal and visual cues. SLP Short Term Goal 2 - Progress (Week 1): Not met SLP Short Term Goal 3 (Week 1): Patient will demonstrate selective attention to functional tasks in a mildly distracting enviornment for 45 minutes with Min verbal cues for redirection. SLP Short Term Goal 3 - Progress (Week 1): Not met SLP Short Term Goal 4 (Week 1): Patient will self-monitor and correct errors during functional tasks with Min verbal cues. SLP Short Term Goal 4 - Progress (Week 1): Not met    New Short Term Goals: Week 2: SLP Short Term Goal 1 (Week 2): STGs=LTGs due to ELOS  Weekly Progress Updates: Patient has made minimal and inconsistent gains this reporting period and has not met any STGs. Currently, patient requires overall Mod A multimodal cues to complete functional tasks safely in regard to attention, problem solving, awareness and recall with use of strategies. Patient's participation and overall function has been greatly impacted by pain in his RLE resulting in restlessness with fluctuating attention and overall participation. Pain also results in poor frustration tolerance. Patient and family education ongoing. Patient would benefit from continued skilled SLP intervention to maximize his cognitive functioning and overall functional independence prior to discharge.       Intensity: Minumum of 1-2 x/day, 30 to 90 minutes Frequency: 3 to 5 out of 7 days Duration/Length of Stay: 6/24 Treatment/Interventions: Cognitive remediation/compensation;Internal/external aids;Cueing hierarchy;Environmental controls;Therapeutic Activities;Functional tasks;Patient/family education   Daily Session  Skilled Therapeutic Interventions:  Attempted to engage patient in therapy X 2 attempts. On the first attempt, patient was asleep with a blanket over his head and was unable to rouse. On the second attempt, patient reported foot pain and requested I come back later. However, ~10 minutes later, patient was seen working with PT. Suspect some avoidance of SLP sessions could potentially be due to severity of current cognitive deficits with low frustration tolerance to work through cognitive tasks. Patient missed 60 minutes of skilled SLP intervention, will re-attempt as able.        Darel Ricketts 12/13/2020, 6:32 AM

## 2020-12-13 NOTE — Progress Notes (Signed)
Physical Therapy Session Note  Patient Details  Name: Nathaniel Lynn MRN: [id number] Date of Birth: February 08, 1991  Today's Date: 12/13/2020 PT Individual Time: 0805-0859 PT Individual Time Calculation (min): 54 min   Short Term Goals: Week 1:  PT Short Term Goal 1 (Week 1): Pt will transfer sit to stand w/ CGA w/ LRAD. PT Short Term Goal 2 (Week 1): Pt will transfer bed <> w/c w/ CGA. PT Short Term Goal 3 (Week 1): Pt will amb 19' w/ LRAD and CGA.  Skilled Therapeutic Interventions/Progress Updates:     Pt received supine in bed asleep. Easily roused and agrees to therapy. Reports significant pain in R foot and calf, tingling in nature. Number not provided. PT provides repositioning, rest breaks, and education to manage pain. Pt performs bed mobility independently. Stand pivot transfer to [facility] with CGA. Pt self propels WC to gym with cues for propulsion technique. Stand pivot to mat table with CGA. Pt performs seated triceps press-ups with yoga blocks. Pt performs sets of x10 starting with blocks on lowest height and broadest BOS and progressing to tallest heigh and smallest BOS for increased difficulty and also stabilization requirement. X6 sets total. PT then educates pt on correct use of axillary crutches and sizes crutches for pt. Pt performs 2x75' with crutches with CGA and cues on optimal use and safety during turns and when transitioning from standing to sitting. Pt also encouraged to attempt putting R foot flat during ambulation to acclimate to WB, through pt has difficulty completing due to pain. Pt then completes x8:00 on Nustep at workload of 6 with average steps per minute ~40. Performed for strength and endurance training as well as desensitization of R foot and lower leg. Stand pivot from nustep>WC>bed with CGA. Left with alarm intact and all needs within reach.  Therapy Documentation Precautions:  Precautions Precautions: Fall Precaution Comments: R-side weakness and lower leg  pain Restrictions Weight Bearing Restrictions: No Other Position/Activity Restrictions: Pt scheduled for CT scan of R foot to r/o pathology, attempted to maintain NWB but pt unable to maintain, but appears TDWB only.   Therapy/Group: Individual Therapy  Beau Fanny 12/13/2020, 4:36 PM

## 2020-12-13 NOTE — Progress Notes (Signed)
PROGRESS NOTE   Subjective/Complaints: Right foot/leg still giving him problems. Lyrica has helped. Pain worst at night  ROS: Patient denies fever, rash, sore throat, blurred vision, nausea, vomiting, diarrhea, cough, shortness of breath or chest pain,  headache, or mood change.   Objective:   No results found. No results for input(s): WBC, HGB, HCT, PLT in the last 72 hours.  No results for input(s): NA, K, CL, CO2, GLUCOSE, BUN, CREATININE, CALCIUM in the last 72 hours.   Intake/Output Summary (Last 24 hours) at 12/13/2020 1054 Last data filed at 12/13/2020 0801 Gross per 24 hour  Intake 1557 ml  Output --  Net 1557 ml        Physical Exam: Vital Signs Blood pressure (!) 145/94, pulse 97, temperature 98.6 F (37 C), temperature source Oral, resp. rate 18, height 5\' 10"  (1.778 m), weight 75.3 kg, SpO2 99 %.  Constitutional: No distress . Vital signs reviewed. HEENT: EOMI, oral membranes moist Neck: supple Cardiovascular: RRR without murmur. No JVD    Respiratory/Chest: CTA Bilaterally without wheezes or rales. Normal effort    GI/Abdomen: BS +, non-tender, non-distended Ext: no clubbing, cyanosis, or edema Psych: pleasant and cooperative  Musculoskeletal:        General: right foot dysesthetic    Cervical back: Normal range of motion. Skin:    General: Skin is warm.    Comments: Multiple healed scars  RLE.   Neurological:    Comments: Speech clear but impulsive with poor safety awareness. Distracted. No focal CN findings. UE motor 5/5. LLE 4-5/5. RLE limited distally d/t pain. Pt with sensory loss in rgiht leg and foot. Has weakness in sciatic muscles, esp peroneal div. Pt has 5/5 HAD and HAB. 0/5 ADF and 1-2/5 APF. DTR's 1+ to absent      Assessment/Plan: 1. Functional deficits which require 3+ hours per day of interdisciplinary therapy in a comprehensive inpatient rehab setting. Physiatrist is providing  close team supervision and 24 hour management of active medical problems listed below. Physiatrist and rehab team continue to assess barriers to discharge/monitor patient progress toward functional and medical goals  Care Tool:  Bathing    Body parts bathed by patient: Right arm, Left arm, Chest, Abdomen, Front perineal area, Buttocks, Right upper leg, Left upper leg, Face, Left lower leg   Body parts bathed by helper: Right lower leg     Bathing assist Assist Level: Minimal Assistance - Patient > 75%     Upper Body Dressing/Undressing Upper body dressing   What is the patient wearing?: Pull over shirt    Upper body assist Assist Level: Supervision/Verbal cueing    Lower Body Dressing/Undressing Lower body dressing      What is the patient wearing?: Pants, Underwear/pull up     Lower body assist Assist for lower body dressing: Contact Guard/Touching assist     Toileting Toileting    Toileting assist Assist for toileting: Contact Guard/Touching assist     Transfers Chair/bed transfer  Transfers assist     Chair/bed transfer assist level: Minimal Assistance - Patient > 75%     Locomotion Ambulation   Ambulation assist   Ambulation activity did not occur: Safety/medical  concerns (pt scheduled for CT scan R foot r/o pathology)          Walk 10 feet activity   Assist           Walk 50 feet activity   Assist           Walk 150 feet activity   Assist           Walk 10 feet on uneven surface  activity   Assist Walk 10 feet on uneven surfaces activity did not occur: Safety/medical concerns         Wheelchair     Assist Will patient use wheelchair at discharge?: No Type of Wheelchair: Manual    Wheelchair assist level: Moderate Assistance - Patient 50 - 74% Max wheelchair distance: 150    Wheelchair 50 feet with 2 turns activity    Assist        Assist Level: Moderate Assistance - Patient 50 - 74%    Wheelchair 150 feet activity     Assist      Assist Level: Moderate Assistance - Patient 50 - 74%   Blood pressure (!) 145/94, pulse 97, temperature 98.6 F (37 C), temperature source Oral, resp. rate 18, height 5\' 10"  (1.778 m), weight 75.3 kg, SpO2 99 %.  Medical Problem List and Plan: 1.   Functional deficits secondary to anoxic brain injury and rhabdomyolysis. 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             -patient may shower             -ELOS/Goals: 28-30 days, mod assist goals with PT, OT, SLP  -Continue CIR therapies including PT, OT, and SLP   -will need AFO potentially for RLE 2.  Impaired mobility: -DVT/anticoagulation:  Pharmaceutical: d/c Lovenox             -antiplatelet therapy:  N/a 3. Right lower extremity dysesthetic pain:              -pt denied any back pain today and there was none one exam either. Exam most consistent with sciatic nerve injury in the proximal right thigh which is entirely possible given his rhabdomyolysis/down-time. I don't think it's related to his ABI either. Continue Oxycodone BID with tylenol prn.              -6/16 increase lyrica to 75mg  tid   -discussed massage, desensitization also 4. Mood:  LCSW to follow for evaluation and support.              -antipsychotic agents: N/A 5. Neuropsych: This patient is not fully capable of making decisions on his own behalf. 6. Skin/Wound Care: Pressure relief measures. Will add juven for supplement (tastes better than prosource)             --added vitamin C and Zinc. 7. Fluids/Electrolytes/Nutrition: Monitor I/O. Stable 6/13, monitor weekly 8. Polysubstance abuse/Hypoxic encephalopathy: Has completed clonidine taper to help with withdrawal             --Oxycodone has been weaned down to 5 mg bid as off on 05/30.               --Seroquel 100 mg hs/50 mg daily. Monitor sleep wake cycle.             --has been generally calm.  9. Aspiration PNA: Completed IV Vanc on  05/20. Respiratory status stable 10. Acute blood loss anemia: Will continue to monitor for  signs of bleeding. --is recovering, stable, repeat Hgb weekly 11. Acute renal failure: Due to rhabdomyolysis has resolved with improvement in K-159 12. Blood pressure: Intermittent elevation noted.             --continue to monitor for now 13. Hypokalemia: Was supplemented. Po intake reported to be poor and refusing supplements.             --continue supp 14. Thrombocytosis: Slowly trending down 15. Superficial thrombosis LUE: Local measures with elevation for edema management/ heat prn.   17. LUQ pain-   6/11- KUB looks OK- con't to monitor 18. Insomnia  6/12-   trazodone 100 mg QHS. With improvement    LOS: 6 days A FACE TO FACE EVALUATION WAS PERFORMED  Ranelle Oyster 12/13/2020, 10:54 AM

## 2020-12-13 NOTE — Progress Notes (Signed)
Occupational Therapy Session Note  Patient Details  Name: Nathaniel Lynn MRN: [id number] Date of Birth: 1991-05-27  Today's Date: 12/13/2020 OT Individual Time: 1501-1530 OT Individual Time Calculation (min): 29 min    Short Term Goals: Week 1:  OT Short Term Goal 1 (Week 1): Patient will complete toilet transfer with CGA. OT Short Term Goal 2 (Week 1): Pt will don socks with min A OT Short Term Goal 3 (Week 1): Pt tolerate standing at the sink with B LEs evenly on the floor for 1 minute within grooming task  Skilled Therapeutic Interventions/Progress Updates:    Pt received in bed and consented to OT tx. Pt transferred from EOB to w/c with squat pivot with SUP and put no weight through LLE 2/2 pain. Pt wheeled down to day room for time mgmt. Session focused on cognitive/problem solving color puzzles and activities with pt able to complete with improved time from 1st puzzle to the third. Pt then instructed in larger jigsaw puzzle, was only able to put 3 pieces together before asking to be done. Pt encouraged to spend a little more time on puzzle, put together one more piece before saying that it was too much. Pt then wheeled self back to room, instructed in leg rest mgmt to prep for transfer, and transferred back to bed with all needs met. Pt left supine in bed with alarm on and all needs met.   Therapy Documentation Precautions:  Precautions Precautions: Fall Precaution Comments: R-side weakness and lower leg pain Restrictions Weight Bearing Restrictions: No Other Position/Activity Restrictions: Pt scheduled for CT scan of R foot to r/o pathology, attempted to maintain NWB but pt unable to maintain, but appears TDWB only.  Vital Signs: Therapy Vitals Temp: 98.2 F (36.8 C) Temp Source: Oral Pulse Rate: 87 Resp: 18 BP: 134/86 Patient Position (if appropriate): Lying Oxygen Therapy SpO2: 100 % O2 Device: Room Air Pain: 7/10      Therapy/Group: Individual Therapy  Dimitriy Carreras 12/13/2020, 3:11 PM

## 2020-12-13 NOTE — Progress Notes (Signed)
Occupational Therapy Session Note  Patient Details  Name: Nathaniel Lynn MRN: [id number] Date of Birth: 12-14-1990  Today's Date: 12/13/2020 OT Individual Time: 7241-9542 OT Individual Time Calculation (min): 58 min    Short Term Goals: Week 1:  OT Short Term Goal 1 (Week 1): Patient will complete toilet transfer with CGA. OT Short Term Goal 2 (Week 1): Pt will don socks with min A OT Short Term Goal 3 (Week 1): Pt tolerate standing at the sink with B LEs evenly on the floor for 1 minute within grooming task  Skilled Therapeutic Interventions/Progress Updates:    Pt greeted semi-reclined in bed asleep, easy to wake and agreeable to OT treatment session. Pt completed bed mobility with supervision. Pt declined any BADL tasks this afternoon. Pt reported R LE nerve pain at 7/10. Pt needed OT assist to don R sock 2/2 hypersensitivity. Educated on rubbing and massaging foot to help desensitize. Pt propelled wc to therapy gym and completed UB there-ex using [employer] arm bike on level 5, 5 minutes forward and 5 minutes backwards. Worked on standing balance/endurance with Wii bowling activity. Pt tolerated standing for 5 frames with RW and able to tolerate touch down weight bearing through R LE. Pt reported his goals are to be able to walk out of the hospital. Worked on R LE foot drop with assisted ankle flexion. OT could feel toes activating, but no active dorsiflexion. Pt returned to room and transferred squat-pivot w/ supervision. Pt left semi-reclined in bed with bed alarm on, call bell in reach and needs met.   Therapy Documentation Precautions:  Precautions Precautions: Fall Precaution Comments: R-side weakness and lower leg pain Restrictions Weight Bearing Restrictions: No Other Position/Activity Restrictions: Pt scheduled for CT scan of R foot to r/o pathology, attempted to maintain NWB but pt unable to maintain, but appears TDWB only.  Pain:  7/10 pain to R LE, rest and repositioned for  comfot   Therapy/Group: Individual Therapy  Valma Cava 12/13/2020, 1:39 PM

## 2020-12-13 NOTE — Progress Notes (Signed)
Physical Therapy Session Note Pt seen as makeup visit for missed time  Patient Details  Name: Nathaniel Lynn MRN: [id number] Date of Birth: [birth year]  Today's Date: 12/13/2020 PT Individual Time: 1110-1150 PT Individual Time Calculation (min): 40 min   Short Term Goals: Week 1:  PT Short Term Goal 1 (Week 1): Pt will transfer sit to stand w/ CGA w/ LRAD. PT Short Term Goal 2 (Week 1): Pt will transfer bed <> w/c w/ CGA. PT Short Term Goal 3 (Week 1): Pt will amb 61' w/ LRAD and CGA. Week 2:    Week 3:     Skilled Therapeutic Interventions/Progress Updates:  Pain:  Pt reports R foot  pain initially when transferring to wc and for approx following.  Distraction of below rx session - pt w/no complaints for remainder of session.  Treatment to tolerance.  Pt initially resting in bed and agreeable to treatment session w/focus on community reintegration, community mobility. Pt supine to sit w/supervision.  Bed to wc w/cues and supervision.  Pt c/o pain in R foot w/transition and repositioning but subsides and no further complaints thru remainder of session. Pt propelled wc >845ft including outdoor uneven sidewalk surface w/cues to increase LUE use to prevent wc from veering L, verbal cues for deep breathing and centering when pt becomes agitated by difficulty w/task. Pt engaged in functional coginitive task in [employer] including scanning menu for items, identifying prices of items w/additional time and some cueing required, determining if given sum of money would be adquate for given combination of 2 items -pt became very frustrated w/this and had much difficulty w/mental math required for this, returned to scanning for single items.  Was able to determine  if given sum would cover single item and to determine more expensive item given two items from menu.  Pt was able to recall rehab floor for return path but did need assist w/pathfinding from [employer] to elevator and elevator to beginning  of unit.  Can recall pathway from there to room. stand pivot transfer wc to bed w/superivsion. Pt left supine w/rails up x 3, alarm set, bed in lowest position, and needs in reach. .    Therapy Documentation Precautions:  Precautions Precautions: Fall Precaution Comments: R-side weakness and lower leg pain Restrictions Weight Bearing Restrictions: No Other Position/Activity Restrictions: Pt scheduled for CT scan of R foot to r/o pathology, attempted to maintain NWB but pt unable to maintain, but appears TDWB only.     Therapy/Group: Individual Therapy  Shearon Balo 12/13/2020, 12:19 PM

## 2020-12-14 NOTE — Progress Notes (Signed)
Physical Therapy Session Note  Patient Details  Name: Nathaniel Lynn MRN: [id number] Date of Birth: [birth year]  Today's Date: 12/14/2020 PT Individual Time: 9983-3825 PT Individual Time Calculation (min): 57 min   Short Term Goals: Week 1:  PT Short Term Goal 1 (Week 1): Pt will transfer sit to stand w/ CGA w/ LRAD. PT Short Term Goal 2 (Week 1): Pt will transfer bed <> w/c w/ CGA. PT Short Term Goal 3 (Week 1): Pt will amb 77' w/ LRAD and CGA.  Skilled Therapeutic Interventions/Progress Updates:     Pt received supine in bed and agrees to therapy. Reports pain in R lower leg and foot. Number not provided. PT provides rest breaks, education, and mobility to manage pain. Pt performs bed mobility independently. Stand pivot transfer to [facility] with close supervision and cues on sequencing. Pt self propels WC x150' independently with bilateral upper extremities. Pt ambulates forward and backward x10' with crutches to practices dynamic balance and functional ambulation. Pt requires minA when ambulating backward due to several LOBS. PT cues for upright gaze to improve posture and balance. Pt completes x5 bouts with seated rest breaks. Pt then ambulates x100' with minA. Utilizing swing through gait pattern. PT cues to increase L heel strike at initial contact for more functional gait pattern and improved BOS and balance. Pt does not put any weight through R leg due to pain. Pt transitions to supine independently. In supine pt performs therex for desensitization of R lower leg as well as strengthening. Pt allows leg to lay passively in extension for 2 minutes for soft tissue stretch. Pt then completes x15 heel slides, 3x15 supine bridges with ball placed between knees to facilitate adductor activation. Pt reports that bridging does not provoke much pain in R leg. Pt performs supine to sit independently. Stand pivot from mat>WC>nustep with cues on positioning. Pt completes x10:00 on nustep at workload of 6 for  strength and endurance training as well as desensitization of L lower leg with AAROM. Average steps per minute >40. Pt self propels WC back to room. Left supine with alarm intact and all needs within reach.  Therapy Documentation Precautions:  Precautions Precautions: Fall Precaution Comments: R-side weakness and lower leg pain Restrictions Weight Bearing Restrictions: No Other Position/Activity Restrictions: Pt scheduled for CT scan of R foot to r/o pathology, attempted to maintain NWB but pt unable to maintain, but appears TDWB only.   Therapy/Group: Individual Therapy  Beau Fanny, PT, DPT 12/14/2020, 5:03 PM

## 2020-12-14 NOTE — Progress Notes (Signed)
Speech Language Pathology Daily Session Note  Patient Details  Name: Nathaniel Lynn MRN: [id number] Date of Birth: 1990-12-21  Today's Date: 12/14/2020 SLP Individual Time: 1015-1100 SLP Individual Time Calculation (min): 45 min  Short Term Goals: Week 2: SLP Short Term Goal 1 (Week 2): STGs=LTGs due to ELOS  Skilled Therapeutic Interventions: Skilled SLP treatment performed with focus on cognitive goals. SLP facilitated problem solving and mental manipulation task with Mod A verbal cues and additional processing time. Patient was able to sustain attention for approximately 15 minutes within quiet, low stimulus environment requiring min cues to re-direct. Patient distractible to ongoing R foot discomfort which limited sustained attention. Patient was left in bed with alarm activated and needs within reach. Continue with plan of care.     Pain Pain Assessment Pain Scale: 0-10 Pain Score: 0-No pain  Therapy/Group: Individual Therapy  Tamala Ser 12/14/2020, 2:29 PM

## 2020-12-14 NOTE — Progress Notes (Signed)
PROGRESS NOTE   Subjective/Complaints: Right foot feeling better today. Thinks it might be healing. Reminded him we also increased lyrica yesterday which he did recall also  ROS: Patient denies fever, rash, sore throat, blurred vision, nausea, vomiting, diarrhea, cough, shortness of breath or chest pain,  headache, or mood change. .   Objective:   No results found. No results for input(s): WBC, HGB, HCT, PLT in the last 72 hours.  No results for input(s): NA, K, CL, CO2, GLUCOSE, BUN, CREATININE, CALCIUM in the last 72 hours.   Intake/Output Summary (Last 24 hours) at 12/14/2020 1052 Last data filed at 12/14/2020 0838 Gross per 24 hour  Intake 720 ml  Output --  Net 720 ml        Physical Exam: Vital Signs Blood pressure (!) 143/98, pulse (!) 103, temperature 98.4 F (36.9 C), temperature source Oral, resp. rate 16, height 5\' 10"  (1.778 m), weight 75.3 kg, SpO2 99 %.  Constitutional: No distress . Vital signs reviewed. HEENT: EOMI, oral membranes moist Neck: supple Cardiovascular: RRR without murmur. No JVD    Respiratory/Chest: CTA Bilaterally without wheezes or rales. Normal effort    GI/Abdomen: BS +, non-tender, non-distended Ext: no clubbing, cyanosis, or edema Psych: pleasant, a little impulsive Musculoskeletal:        General: right foot/lower leg less sensitive to touch/ROM today    Cervical back: Normal range of motion. Skin:    General: Skin is warm.    Comments: Multiple healed scars  RLE.   Neurological:    Comments: improving awareness. Still somewhat distracted. No focal CN findings. UE motor 5/5. LLE 4-5/5. RLE limited distally d/t pain. Pt with sensory loss in rgiht leg and foot. Has weakness in sciatic muscles, esp peroneal div. Pt has 5/5 HAD and HAB. 0/5 ADF and 2/5 APF. DTR's 1+ to absent      Assessment/Plan: 1. Functional deficits which require 3+ hours per day of interdisciplinary therapy  in a comprehensive inpatient rehab setting. Physiatrist is providing close team supervision and 24 hour management of active medical problems listed below. Physiatrist and rehab team continue to assess barriers to discharge/monitor patient progress toward functional and medical goals  Care Tool:  Bathing    Body parts bathed by patient: Right arm, Left arm, Chest, Abdomen, Front perineal area, Buttocks, Right upper leg, Left upper leg, Face, Left lower leg, Right lower leg   Body parts bathed by helper: Right lower leg     Bathing assist Assist Level: Contact Guard/Touching assist     Upper Body Dressing/Undressing Upper body dressing   What is the patient wearing?: Pull over shirt    Upper body assist Assist Level: Set up assist    Lower Body Dressing/Undressing Lower body dressing      What is the patient wearing?: Underwear/pull up, Pants     Lower body assist Assist for lower body dressing: Contact Guard/Touching assist     Toileting Toileting    Toileting assist Assist for toileting: Contact Guard/Touching assist     Transfers Chair/bed transfer  Transfers assist     Chair/bed transfer assist level: Supervision/Verbal cueing     Locomotion Ambulation   Ambulation  assist   Ambulation activity did not occur: Safety/medical concerns (pt scheduled for CT scan R foot r/o pathology)          Walk 10 feet activity   Assist           Walk 50 feet activity   Assist           Walk 150 feet activity   Assist           Walk 10 feet on uneven surface  activity   Assist Walk 10 feet on uneven surfaces activity did not occur: Safety/medical concerns         Wheelchair     Assist Will patient use wheelchair at discharge?: No Type of Wheelchair: Manual    Wheelchair assist level: Moderate Assistance - Patient 50 - 74% Max wheelchair distance: 150    Wheelchair 50 feet with 2 turns activity    Assist         Assist Level: Moderate Assistance - Patient 50 - 74%   Wheelchair 150 feet activity     Assist      Assist Level: Moderate Assistance - Patient 50 - 74%   Blood pressure (!) 143/98, pulse (!) 103, temperature 98.4 F (36.9 C), temperature source Oral, resp. rate 16, height 5\' 10"  (1.778 m), weight 75.3 kg, SpO2 99 %.  Medical Problem List and Plan: 1.   Functional deficits secondary to anoxic brain injury and rhabdomyolysis. 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             -patient may shower             -ELOS/Goals: 28-30 days, mod assist goals with PT, OT, SLP  --Continue CIR therapies including PT, OT, and SLP    -will need AFO potentially for RLE 2.  Impaired mobility: -DVT/anticoagulation:  Pharmaceutical: d/c Lovenox             -antiplatelet therapy:  N/a 3. Right lower extremity dysesthetic pain:              -pt denied any back pain today and there was none one exam either. Exam most consistent with sciatic nerve injury in the proximal right thigh which is entirely possible given his rhabdomyolysis/down-time. I don't think it's related to his ABI either. Continue Oxycodone BID with tylenol prn.              -6/16 increased lyrica to 75mg  tid with improvement   -have reviewed massage, desensitization also 4. Mood/sleep:  team providing ego support -continue trazodone 100mg  qhs for sleep              -antipsychotic agents: N/A 5. Neuropsych: This patient is not fully capable of making decisions on his own behalf. 6. Skin/Wound Care: Pressure relief measures. Will add juven for supplement (tastes better than prosource)             --continue vitamin C and Zinc. 7. Fluids/Electrolytes/Nutrition: Monitor I/O. Stable 6/13, monitor weekly 8. Polysubstance abuse/Hypoxic encephalopathy: Has completed clonidine taper to help with withdrawal             --Oxycodone has been weaned down to 5 mg bid as off on 05/30.               --Seroquel 100 mg  hs/50 mg daily. Monitor sleep wake cycle.             --a little impulsive but otherwise quite pleasant demeanor.  9. Aspiration PNA: Completed IV Vanc on 05/20. Respiratory status stable 10. Acute blood loss anemia: Will continue to monitor for signs of bleeding. --is recovering, stable, repeat Hgb weekly 11. Acute renal failure: Due to rhabdomyolysis has resolved with improvement in K-159 12. Blood pressure: Intermittent elevation noted.             --pain component  -if persistent may need to treat 13. Hypokalemia: Was supplemented. Po intake reported to be poor and refusing supplements.             --recheck Monday 14. Thrombocytosis: Slowly trending down 15. Superficial thrombosis LUE: Local measures with elevation for edema management/ heat prn.   17. LUQ pain-   improving     LOS: 7 days A FACE TO FACE EVALUATION WAS PERFORMED  Ranelle Oyster 12/14/2020, 10:52 AM

## 2020-12-14 NOTE — Progress Notes (Signed)
Occupational Therapy Weekly Progress Note  Patient Details  Name: Nathaniel Lynn MRN: [id number] Date of Birth: [birth year]  Beginning of progress report period: December 08, 2020 End of progress report period: December 14, 2020  Today's Date: 12/14/2020 OT Individual Time: 2633-3545 OT Individual Time Calculation (min): 54 min    Patient has met 2 of 3 short term goals.  Patient is making steady progress towards OT goals. Pt currently requires CGA for functional transfers and sit<>stands within BADL tasks. Pts biggest limitation right now is R LE nerve pain that limits his tolerance to weight bear on R, affecting balance within functional tasks. Pt also demonstrates poor safety awareness within BADL Tasks, although improved some from eval. Continue current POC.   Patient continues to demonstrate the following deficits: muscle weakness, decreased safety awareness, and decreased standing balance, decreased postural control, and decreased balance strategies and therefore will continue to benefit from skilled OT intervention to enhance overall performance with BADL.  Patient progressing toward long term goals..  Continue plan of care.  OT Short Term Goals Week 1:  OT Short Term Goal 1 (Week 1): Patient will complete toilet transfer with CGA. OT Short Term Goal 1 - Progress (Week 1): Met OT Short Term Goal 2 (Week 1): Pt will don socks with min A OT Short Term Goal 2 - Progress (Week 1): Met OT Short Term Goal 3 (Week 1): Pt tolerate standing at the sink with B LEs evenly on the floor for 1 minute within grooming task OT Short Term Goal 3 - Progress (Week 1): Progressing toward goal Week 2:  OT Short Term Goal 1 (Week 2): LTG=STG 2/2 ELOS  Skilled Therapeutic Interventions/Progress Updates:    Pt greeted asleep in bed, easy to wake, and agreeable to OT treatment session. Bed mobility performed independently. Pt reporting bad pain in his R LE this morning but wanted to take a shower. Pt ambulated w/  RW and CGA and did not tolerate any weight bearing through R LE. Pt needed OT verbal cues for safety to sit down to take off shirt as pt lost balance attempting to do this in standing. Educated again on safety awareness and sitting down to doff clothes. Bathing completed with overall supervision/set-up A, but CGA when standing to wash buttocks holding onto grab bar. Pt ambulated out of bathroom in similar fashion and sat EOB to get dressing. CGA for balance when standing to pull up pants. R LE continues to be very sensitive even just when clothing touches leg form threading pants. Pt brought down to laundry room and pt tolerated standing at washer to load top loader. Educated on safety strategies within laundry task. Pt returned to room left sitting up in wc with alarm belt on, call bell in reach and needs met.   Therapy Documentation Precautions:  Precautions Precautions: Fall Precaution Comments: R-side weakness and lower leg pain Restrictions Weight Bearing Restrictions: No Other Position/Activity Restrictions: Pt scheduled for CT scan of R foot to r/o pathology, attempted to maintain NWB but pt unable to maintain, but appears TDWB only.  Pain:  Pt reports 10/10 burning nerve pain to R LE, repositioned for improved pain control   Therapy/Group: Individual Therapy  Valma Cava 12/14/2020, 8:06 AM

## 2020-12-14 NOTE — Progress Notes (Signed)
Occupational Therapy Session Note  Patient Details  Name: Nathaniel Lynn MRN: [id number] Date of Birth: 18-Jun-1991  Today's Date: 12/14/2020 OT Individual Time: 3557-3220 OT Individual Time Calculation (min): 40 min   Short Term Goals: Week 1:  OT Short Term Goal 1 (Week 1): Patient will complete toilet transfer with CGA. OT Short Term Goal 1 - Progress (Week 1): Met OT Short Term Goal 2 (Week 1): Pt will don socks with min A OT Short Term Goal 2 - Progress (Week 1): Met OT Short Term Goal 3 (Week 1): Pt tolerate standing at the sink with B LEs evenly on the floor for 1 minute within grooming task OT Short Term Goal 3 - Progress (Week 1): Progressing toward goal  Skilled Therapeutic Interventions/Progress Updates:    Pt greeted in bed, reported being premedicated for pain. He asked if he could retrieve his clothes from the unit dryer. Used the RW for transfer with pt initiating maintaining Rt LE NWB due to pain. Advised pt during all transfers to bear some weight through his Rt LE and also better position the RW before sitting down onto transfer surfaces. Pt self propelled the w/c to the laundry room, able to remember the location without cues from therapist. With the w/c parked, worked on dynamic sitting balance while pt took out his clothes and placed them into a bag. He was able to pathfind his way back to his room and then asked to fold his clothes. RW used for transfer to EOB. Dual task demands while pt folded clothing and provided OT with orientation information, pt initiating using the calendar as needed for correct answers. Pt verbalized he was really looking forward to his d/c date and was able to tell me that it was about 1 week away! Pt then transferred to the w/c and put his clothing away in the closet w/c level, used the RW to ambulate into the bathroom where he changed into his personal clothing at sit<stand level from 3:1 near toilet. Vcs for increasing safety during LB dressing as  pt tried to "hop" out of his paper pants, reach across the toilet for the grab bar and attempt to stand without device. He wanted to transfer back to bed at end of session, used the RW to do this. Pt remained in bed at close of session, all needs within reach and bed alarm set.   We also recorded his AM therapy events in his memory journal today  Therapy Documentation Precautions:  Precautions Precautions: Fall Precaution Comments: R-side weakness and lower leg pain Restrictions Weight Bearing Restrictions: No Other Position/Activity Restrictions: Pt scheduled for CT scan of R foot to r/o pathology, attempted to maintain NWB but pt unable to maintain, but appears TDWB only. ADL: ADL Eating: Set up Grooming: Minimal assistance Upper Body Bathing: Minimal assistance Lower Body Bathing: Moderate assistance Upper Body Dressing: Minimal assistance Lower Body Dressing: Moderate assistance Toileting: Moderate assistance Toilet Transfer: Minimal assistance Gaffer Transfer: Minimal assistance     Therapy/Group: Individual Therapy  Annaleese Guier A Sami Froh 12/14/2020, 12:12 PM

## 2020-12-15 MED ORDER — PREGABALIN 50 MG PO CAPS
100.0000 mg | ORAL_CAPSULE | Freq: Three times a day (TID) | ORAL | Status: DC
  Administered 2020-12-15 – 2020-12-21 (×17): 100 mg via ORAL
  Filled 2020-12-15 (×5): qty 2
  Filled 2020-12-15: qty 4
  Filled 2020-12-15 (×11): qty 2

## 2020-12-15 NOTE — Progress Notes (Signed)
PROGRESS NOTE   Subjective/Complaints: The patient states that his foot felt better yesterday but is hurting again today.  He is wondering whether his Lyrica dose can be increased  ROS: Patient denies f chest pain, shortness of breath, nausea vomiting diarrhea Objective:   No results found. No results for input(s): WBC, HGB, HCT, PLT in the last 72 hours.  No results for input(s): NA, K, CL, CO2, GLUCOSE, BUN, CREATININE, CALCIUM in the last 72 hours.   Intake/Output Summary (Last 24 hours) at 12/15/2020 1445 Last data filed at 12/15/2020 0727 Gross per 24 hour  Intake 840 ml  Output --  Net 840 ml         Physical Exam: Vital Signs Blood pressure (!) 139/95, pulse (!) 109, temperature 98.5 F (36.9 C), temperature source Oral, resp. rate 18, height 5\' 10"  (1.778 m), weight 75.3 kg, SpO2 99 %.   General: No acute distress Mood and affect are appropriate Heart: Regular rate and rhythm no rubs murmurs or extra sounds Lungs: Clear to auscultation, breathing unlabored, no rales or wheezes Abdomen: Positive bowel sounds, soft nontender to palpation, nondistended Extremities: No clubbing, cyanosis, or edema Skin: No evidence of breakdown, no evidence of rash Neurologic: Cranial nerves II through XII intact, motor strength is 5/5 in bilateral deltoid, bicep, tricep, grip, hip flexor, knee extensors, ankle dorsiflexor and plantar flexor Sensory exam normal sensation to light touch and proprioception in bilateral upper and lower extremities Cerebellar exam normal finger to nose to finger as well as heel to shin in bilateral upper and lower extremities Musculoskeletal: Full range of motion in all 4 extremities. No joint swelling  Musculoskeletal:        General: right foot/lower leg less sensitive to touch/ROM today    Cervical back: Normal range of motion. Skin:    General: Skin is warm.    Comments: Multiple healed scars   RLE.   Neurological:    Comments: improving awareness. Still somewhat distracted. No focal CN findings. UE motor 5/5. LLE 4-5/5. RLE limited distally d/t pain. Pt with sensory loss in rgiht leg and foot. Has weakness in sciatic muscles, esp peroneal div. Pt has 5/5 HAD and HAB. 0/5 ADF and 2/5 APF. DTR's 1+ to absent  Patient has hypersensitivity on the top and bottom of the foot.   Assessment/Plan: 1. Functional deficits which require 3+ hours per day of interdisciplinary therapy in a comprehensive inpatient rehab setting. Physiatrist is providing close team supervision and 24 hour management of active medical problems listed below. Physiatrist and rehab team continue to assess barriers to discharge/monitor patient progress toward functional and medical goals  Care Tool:  Bathing    Body parts bathed by patient: Right arm, Left arm, Chest, Abdomen, Front perineal area, Buttocks, Right upper leg, Left upper leg, Face, Left lower leg, Right lower leg   Body parts bathed by helper: Right lower leg     Bathing assist Assist Level: Contact Guard/Touching assist     Upper Body Dressing/Undressing Upper body dressing   What is the patient wearing?: Pull over shirt    Upper body assist Assist Level: Set up assist    Lower Body Dressing/Undressing Lower  body dressing      What is the patient wearing?: Underwear/pull up, Pants     Lower body assist Assist for lower body dressing: Contact Guard/Touching assist     Toileting Toileting    Toileting assist Assist for toileting: Contact Guard/Touching assist     Transfers Chair/bed transfer  Transfers assist     Chair/bed transfer assist level: Supervision/Verbal cueing     Locomotion Ambulation   Ambulation assist   Ambulation activity did not occur: Safety/medical concerns (pt scheduled for CT scan R foot r/o pathology)          Walk 10 feet activity   Assist           Walk 50 feet activity   Assist            Walk 150 feet activity   Assist           Walk 10 feet on uneven surface  activity   Assist Walk 10 feet on uneven surfaces activity did not occur: Safety/medical concerns         Wheelchair     Assist Will patient use wheelchair at discharge?: No Type of Wheelchair: Manual    Wheelchair assist level: Moderate Assistance - Patient 50 - 74% Max wheelchair distance: 150    Wheelchair 50 feet with 2 turns activity    Assist        Assist Level: Moderate Assistance - Patient 50 - 74%   Wheelchair 150 feet activity     Assist      Assist Level: Moderate Assistance - Patient 50 - 74%   Blood pressure (!) 139/95, pulse (!) 109, temperature 98.5 F (36.9 C), temperature source Oral, resp. rate 18, height 5\' 10"  (1.778 m), weight 75.3 kg, SpO2 99 %.  Medical Problem List and Plan: 1.   Functional deficits secondary to anoxic brain injury and rhabdomyolysis. 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             -patient may shower             -ELOS/Goals: 28-30 days, mod assist goals with PT, OT, SLP  --Continue CIR therapies including PT, OT, and SLP    -will need AFO potentially for RLE 2.  Impaired mobility: -DVT/anticoagulation:  Pharmaceutical: d/c Lovenox             -antiplatelet therapy:  N/a 3. Right lower extremity dysesthetic pain:              -pt denied any back pain today and there was none one exam either. Exam most consistent with sciatic nerve injury in the proximal right thigh which is entirely possible given his rhabdomyolysis/down-time. I don't think it's related to his ABI either. Continue Oxycodone BID with tylenol prn.              -6/16 increased lyrica to 75mg  tid with improvement, will increase again to 100 mg tomorrow   -have reviewed massage, desensitization also 4. Mood/sleep:  team providing ego support -continue trazodone 100mg  qhs for sleep              -antipsychotic agents: N/A 5.  Neuropsych: This patient is not fully capable of making decisions on his own behalf. 6. Skin/Wound Care: Pressure relief measures. Will add juven for supplement (tastes better than prosource)             --continue vitamin C and Zinc. 7. Fluids/Electrolytes/Nutrition: Monitor I/O. Stable 6/13, monitor  weekly 8. Polysubstance abuse/Hypoxic encephalopathy: Has completed clonidine taper to help with withdrawal             --Oxycodone has been weaned down to 5 mg bid as off on 05/30.               --Seroquel 100 mg hs/50 mg daily. Monitor sleep wake cycle.             --a little impulsive but otherwise quite pleasant demeanor.  9. Aspiration PNA: Completed IV Vanc on 05/20. Respiratory status stable 10. Acute blood loss anemia: Will continue to monitor for signs of bleeding. --is recovering, stable, repeat Hgb weekly 11. Acute renal failure: Due to rhabdomyolysis has resolved with improvement in K-159 12. Blood pressure: Intermittent elevation noted.             --pain component  -if persistent may need to treat 13. Hypokalemia: Was supplemented. Po intake reported to be poor and refusing supplements.             --recheck Monday 14. Thrombocytosis: Slowly trending down 15. Superficial thrombosis LUE: Local measures with elevation for edema management/ heat prn.   17. LUQ pain-   improving     LOS: 8 days A FACE TO FACE EVALUATION WAS PERFORMED  Erick Colace 12/15/2020, 2:45 PM

## 2020-12-15 NOTE — Plan of Care (Signed)
  Problem: Consults Goal: RH BRAIN INJURY PATIENT EDUCATION Description: Description: See Patient Education module for eduction specifics Outcome: Progressing Goal: Skin Care Protocol Initiated - if Braden Score 18 or less Description: If consults are not indicated, leave blank or document N/A Outcome: Progressing Goal: Nutrition Consult-if indicated Outcome: Progressing   Problem: RH SKIN INTEGRITY Goal: RH STG ABLE TO PERFORM INCISION/WOUND CARE W/ASSISTANCE Description: STG Able To Perform Incision/Wound Care With Mod Assistance. Outcome: Progressing   Problem: RH SAFETY Goal: RH STG ADHERE TO SAFETY PRECAUTIONS W/ASSISTANCE/DEVICE Description: STG Adhere to Safety Precautions With Mod Assistance/Device. Outcome: Progressing Goal: RH STG DECREASED RISK OF FALL WITH ASSISTANCE Description: STG Decreased Risk of Fall With Cues and Reminders. Outcome: Progressing   Problem: RH COGNITION-NURSING Goal: RH STG USES MEMORY AIDS/STRATEGIES W/ASSIST TO PROBLEM SOLVE Description: STG Uses Memory Aids/Strategies With Cues and Reminders to Problem Solve. Outcome: Progressing Goal: RH STG ANTICIPATES NEEDS/CALLS FOR ASSIST W/ASSIST/CUES Description: STG Anticipates Needs/Calls for Assist With Mod Assistance/Cues and Reminders. Outcome: Progressing   Problem: RH KNOWLEDGE DEFICIT BRAIN INJURY Goal: RH STG INCREASE KNOWLEDGE OF SELF CARE AFTER BRAIN INJURY Description: Patient will be able to demonstrate knowledge of medication management, pain management, skin/wound care with educational materials and handouts provided by staff independently at discharge. Outcome: Progressing   Problem: Safety: Goal: Non-violent Restraint(s) Outcome: Progressing   Problem: RH PAIN MANAGEMENT Goal: RH STG PAIN MANAGED AT OR BELOW PT'S PAIN GOAL Description: < 3 on a 0-10 pain scale. Outcome: Not Progressing

## 2020-12-15 NOTE — Progress Notes (Signed)
Educated Pt. On last bowel movent on 12/10/20. Administered miralax at HS.

## 2020-12-16 NOTE — Progress Notes (Signed)
Physical Therapy Weekly Progress Note  Patient Details  Name: Nathaniel Lynn MRN: [id number] Date of Birth: [birth year]  Beginning of progress report period: December 08, 2020 End of progress report period: December 16, 2020  Today's Date: 12/16/2020 PT Individual Time: 1005-1100 PT Individual Time Calculation (min): 55 min   Patient has met 3 of 3 short term goals.  Pt is progressing very well toward mobility goals, improving independence with bed mobility, transfers, balance, and ambulation. Pt continues to have pain in R lower leg and foot, limiting ability to bear weight through R lower extremity. Pt has been training on axillary crutches due to pain and lack of WB through foot. PT has encouraged pt to place as much weight as possible through R foot to desensitize extremity and provide stretch for posterior calf muscles. Pt will benefit from continued crutch training as well as work on stair training and community reintegration prior to discharge.  Patient continues to demonstrate the following deficits muscle weakness, decreased cardiorespiratoy endurance, decreased safety awareness and decreased memory, and decreased standing balance and decreased balance strategies and therefore will continue to benefit from skilled PT intervention to increase functional independence with mobility.  Patient progressing toward long term goals..  Continue plan of care.  PT Short Term Goals Week 1:  PT Short Term Goal 1 (Week 1): Pt will transfer sit to stand w/ CGA w/ LRAD. PT Short Term Goal 1 - Progress (Week 1): Met PT Short Term Goal 2 (Week 1): Pt will transfer bed <> w/c w/ CGA. PT Short Term Goal 2 - Progress (Week 1): Met PT Short Term Goal 3 (Week 1): Pt will amb 39' w/ LRAD and CGA. PT Short Term Goal 3 - Progress (Week 1): Met Week 2:  PT Short Term Goal 1 (Week 2): STGs = LTGs  Skilled Therapeutic Interventions/Progress Updates:  Ambulation/gait training;Discharge planning;Functional mobility  training;Therapeutic Activities;UE/LE Strength taining/ROM;Balance/vestibular training;Community reintegration;Neuromuscular re-education;Patient/family education;Stair training;Therapeutic Exercise;UE/LE Coordination activities;Wheelchair propulsion/positioning   Pt received supine in bed asleep. Easily roused and agreeable to therapy, but reports significant pain in R lower leg and foot. Number not provided. PT provides rest breaks, mobility, and repositioning to manage pain. Bed mobility independent. Pt performs stand pivot transfer to [facility] with supervision and cues for initiation and sequencing. Pt performs Nustep for 10:00 at workload of 5 with steps per minute ~50. Performed for strength and endurance training as well as desensitization technique for R lower extremity with AAROM. Pt transfers back to [facility] with supervision. Pt self propels WC >500', primarily independently, with only verbal cues given to sequencing backing into elevator. Pt propels outdoors and over unlevel ground. Pt performs ambulation outdoors for community reintegration and gait training over varying surfaces and grades. Pt ambulates 150', and 230' with axillary crutches and CGA, with PT providing multimodal cueing for upright gaze to improve posture and balance, and sequencing for safe transition from ambulating to sitting back in WC. Pt propels WC back to room independently. Left supine in bed with alarm intact and all needs within reach.  Therapy Documentation Precautions:  Precautions Precautions: Fall Precaution Comments: R-side weakness and lower leg pain Restrictions Weight Bearing Restrictions: No Other Position/Activity Restrictions: Pt scheduled for CT scan of R foot to r/o pathology, attempted to maintain NWB but pt unable to maintain, but appears TDWB only.   Therapy/Group: Individual Therapy  Breck Coons, PT, DPT 12/16/2020, 3:37 PM

## 2020-12-16 NOTE — Plan of Care (Signed)
  Problem: Consults Goal: RH BRAIN INJURY PATIENT EDUCATION Description: Description: See Patient Education module for eduction specifics Outcome: Progressing Goal: Skin Care Protocol Initiated - if Braden Score 18 or less Description: If consults are not indicated, leave blank or document N/A Outcome: Progressing Goal: Nutrition Consult-if indicated Outcome: Progressing   Problem: RH SKIN INTEGRITY Goal: RH STG ABLE TO PERFORM INCISION/WOUND CARE W/ASSISTANCE Description: STG Able To Perform Incision/Wound Care With Mod Assistance. Outcome: Progressing   Problem: RH SAFETY Goal: RH STG ADHERE TO SAFETY PRECAUTIONS W/ASSISTANCE/DEVICE Description: STG Adhere to Safety Precautions With Mod Assistance/Device. Outcome: Progressing Goal: RH STG DECREASED RISK OF FALL WITH ASSISTANCE Description: STG Decreased Risk of Fall With Cues and Reminders. Outcome: Progressing   Problem: RH COGNITION-NURSING Goal: RH STG USES MEMORY AIDS/STRATEGIES W/ASSIST TO PROBLEM SOLVE Description: STG Uses Memory Aids/Strategies With Cues and Reminders to Problem Solve. Outcome: Progressing Goal: RH STG ANTICIPATES NEEDS/CALLS FOR ASSIST W/ASSIST/CUES Description: STG Anticipates Needs/Calls for Assist With Mod Assistance/Cues and Reminders. Outcome: Progressing   Problem: RH PAIN MANAGEMENT Goal: RH STG PAIN MANAGED AT OR BELOW PT'S PAIN GOAL Description: < 3 on a 0-10 pain scale. Outcome: Progressing   Problem: RH KNOWLEDGE DEFICIT BRAIN INJURY Goal: RH STG INCREASE KNOWLEDGE OF SELF CARE AFTER BRAIN INJURY Description: Patient will be able to demonstrate knowledge of medication management, pain management, skin/wound care with educational materials and handouts provided by staff independently at discharge. Outcome: Progressing   Problem: Safety: Goal: Non-violent Restraint(s) Outcome: Progressing

## 2020-12-17 NOTE — Progress Notes (Signed)
Occupational Therapy Session Note  Patient Details  Name: Nathaniel Lynn MRN: [id number] Date of Birth: [birth year]  Today's Date: 12/17/2020 OT Individual Time: 1130-1200 OT Individual Time Calculation (min): 30 min    Short Term Goals: Week 2:  OT Short Term Goal 1 (Week 2): LTG=STG 2/2 ELOS  Skilled Therapeutic Interventions/Progress Updates:    Patient in bed alert and cooperative.  He requests a shower this session.  Supine to sitting edge of bed with CS.  Sit to stand and ambulation with RW to/from bed, shower bench with CS/CGA.  Undressing seated on shower bench with CS and min cues.  Shower completed with CS.  Dressing completed seated on shower bench with set up - with exception of right sock mod A.  He returned to bed at close of session with CGA.  Completed bed level positioning and AROM activities with encouragement and use of towel for guidance with good results.  Bed alarm set, call bell in reach, telesitter in place.    Therapy Documentation Precautions:  Precautions Precautions: Fall Precaution Comments: R-side weakness and lower leg pain Restrictions Weight Bearing Restrictions: No Other Position/Activity Restrictions: Pt scheduled for CT scan of R foot to r/o pathology, attempted to maintain NWB but pt unable to maintain, but appears TDWB only.   Therapy/Group: Individual Therapy  Barrie Lyme 12/17/2020, 7:40 AM

## 2020-12-17 NOTE — Progress Notes (Signed)
Physical Therapy Session Note  Patient Details  Name: Nathaniel Lynn MRN: [id number] Date of Birth: 1990-07-26  Today's Date: 12/17/2020 PT Individual Time: 0900-1000 PT Individual Time Calculation (min): 60 min   Short Term Goals: Week 2:  PT Short Term Goal 1 (Week 2): STGs = LTGs  Skilled Therapeutic Interventions/Progress Updates:     Patient in bed upon PT arrival. Patient alert and agreeable to PT session. Patient reported 5-8/10 R posterior lower extremity shooting/tinging pain during session, RN made aware. PT provided repositioning, rest breaks, and distraction as pain interventions throughout session.   Therapeutic Activity: Bed Mobility: Patient performed supine to/from sit independently in a flat bed without use of bed rails.  Transfers: Patient performed squat pivot bed<>w/c with supervision for safety. Patient with L foot high off the ground during transfers. He performed sit to/from stand x4 with CGA for safety using B crutches. Patient required mod cues to teach back technique for standing with crutches safety, required cues for pushing up and reaching back to sit and backing up to the chair prior to sitting for safety. Patient able to teach back with cues only to slow down for improved recall and demonstrated good technique on last trial.  Gait Training:  Patient ambulated 50 feet x3 using axillary crutches with CGA-close supervision. Ambulated with hop-to gait pattern on L with R foot off the floor 100% of the time x2 trials and on the floor with partial weight bearing x7-8 steps on last trial following NMR, see below. Provided verbal cues for safety awareness, reduced step length for improved balance, and safe turning technique. Encouraged increased R weight bearing for improved DF ROM and desensitization to pain response during ambulation.   Wheelchair Mobility:  Patient propelled wheelchair ~150 feet x2 with mod I. Provided verbal cues for management of w/c parts  throughout session with demonstration of donning/doffing leg rests. Patient progressed from max A to supervision with min cues with this task during session.   Neuromuscular Re-ed: Patient performed the following pain science neuroreeducation for desensitization of R lower extremity neuropathic pain: -educated on pain threshold elevation following traumatic events and psychosocial factors contributing to pain exastubation -educated on strategies to lower his pain threshold with use of "fire alarm" metaphor for overactive CNS response -utilized mirror therapy techniques for desensitization with touch and movement of L leg while looking at the reflection in the mirror Pain decreased to 0-2/10 after and patient performed standing with weight bearing on R foot x1 without increase in pain and ambulated, as described above, without increase in pain  Patient in bed at end of session with breaks locked, bed alarm set, and all needs within reach. Patient reviewed the process for calling for assistance and able to teach back not to get up without assistance. Encouraged patient to comply with this process to reduce the need for the Telesitter and improve independence with self-regulation. Patient continued to report 0-2/10 pain at end of session and very appreciative of education provided during session.   Therapy Documentation Precautions:  Precautions Precautions: Fall Precaution Comments: R-side weakness and lower leg pain Restrictions Weight Bearing Restrictions: No Other Position/Activity Restrictions: Pt scheduled for CT scan of R foot to r/o pathology, attempted to maintain NWB but pt unable to maintain, but appears TDWB only.    Therapy/Group: Individual Therapy  Starlina Lapre L Jazyiah Yiu PT, DPT  12/17/2020, 3:57 PM

## 2020-12-17 NOTE — Progress Notes (Signed)
Writer notified by phlebotomist that patient refused to allow blood draw this morning.

## 2020-12-17 NOTE — Progress Notes (Signed)
Pt declines prevalon boot tonight.

## 2020-12-17 NOTE — Progress Notes (Signed)
PROGRESS NOTE   Subjective/Complaints: Sleepy today  ROS: Patient denies chest pain, shortness of breath, nausea vomiting diarrhea Objective:   No results found. No results for input(s): WBC, HGB, HCT, PLT in the last 72 hours.  No results for input(s): NA, K, CL, CO2, GLUCOSE, BUN, CREATININE, CALCIUM in the last 72 hours.   Intake/Output Summary (Last 24 hours) at 12/17/2020 1545 Last data filed at 12/17/2020 1342 Gross per 24 hour  Intake 720 ml  Output --  Net 720 ml        Physical Exam: Vital Signs Blood pressure 113/89, pulse (!) 102, temperature 98 F (36.7 C), temperature source Oral, resp. rate 18, height 5\' 10"  (1.778 m), weight 75.3 kg, SpO2 100 %.  Gen: no distress, normal appearing HEENT: oral mucosa pink and moist, NCAT Cardio: Tachycardic Chest: normal effort, normal rate of breathing Abd: soft, non-distended Ext: no edema Psych: pleasant, normal affect Skin: No evidence of breakdown, no evidence of rash Neurologic: Cranial nerves II through XII intact, motor strength is 5/5 in bilateral deltoid, bicep, tricep, grip, hip flexor, knee extensors, ankle dorsiflexor and plantar flexor Sensory exam normal sensation to light touch and proprioception in bilateral upper and lower extremities Cerebellar exam normal finger to nose to finger as well as heel to shin in bilateral upper and lower extremities Musculoskeletal: Full range of motion in all 4 extremities. No joint swelling  Musculoskeletal:        General: right foot/lower leg less sensitive to touch/ROM today    Cervical back: Normal range of motion. Skin:    General: Skin is warm.    Comments: Multiple healed scars  RLE.   Neurological:    Comments: improving awareness. Still somewhat distracted. No focal CN findings. UE motor 5/5. LLE 4-5/5. RLE limited distally d/t pain. Pt with sensory loss in rgiht leg and foot. Has weakness in sciatic  muscles, esp peroneal div. Pt has 5/5 HAD and HAB. 0/5 ADF and 2/5 APF. DTR's 1+ to absent  Patient has hypersensitivity on the top and bottom of the foot.   Assessment/Plan: 1. Functional deficits which require 3+ hours per day of interdisciplinary therapy in a comprehensive inpatient rehab setting. Physiatrist is providing close team supervision and 24 hour management of active medical problems listed below. Physiatrist and rehab team continue to assess barriers to discharge/monitor patient progress toward functional and medical goals  Care Tool:  Bathing    Body parts bathed by patient: Right arm, Left arm, Chest, Abdomen, Front perineal area, Buttocks, Right upper leg, Left upper leg, Face, Left lower leg, Right lower leg   Body parts bathed by helper: Right lower leg     Bathing assist Assist Level: Contact Guard/Touching assist     Upper Body Dressing/Undressing Upper body dressing   What is the patient wearing?: Pull over shirt    Upper body assist Assist Level: Set up assist    Lower Body Dressing/Undressing Lower body dressing      What is the patient wearing?: Underwear/pull up, Pants     Lower body assist Assist for lower body dressing: Contact Guard/Touching assist     Toileting Toileting    Toileting assist  Assist for toileting: Contact Guard/Touching assist     Transfers Chair/bed transfer  Transfers assist     Chair/bed transfer assist level: Supervision/Verbal cueing     Locomotion Ambulation   Ambulation assist   Ambulation activity did not occur: Safety/medical concerns (pt scheduled for CT scan R foot r/o pathology)          Walk 10 feet activity   Assist           Walk 50 feet activity   Assist           Walk 150 feet activity   Assist           Walk 10 feet on uneven surface  activity   Assist Walk 10 feet on uneven surfaces activity did not occur: Safety/medical concerns          Wheelchair     Assist Will patient use wheelchair at discharge?: No Type of Wheelchair: Manual    Wheelchair assist level: Moderate Assistance - Patient 50 - 74% Max wheelchair distance: 150    Wheelchair 50 feet with 2 turns activity    Assist        Assist Level: Moderate Assistance - Patient 50 - 74%   Wheelchair 150 feet activity     Assist      Assist Level: Moderate Assistance - Patient 50 - 74%   Blood pressure 113/89, pulse (!) 102, temperature 98 F (36.7 C), temperature source Oral, resp. rate 18, height 5\' 10"  (1.778 m), weight 75.3 kg, SpO2 100 %.  Medical Problem List and Plan: 1.   Functional deficits secondary to anoxic brain injury and rhabdomyolysis. 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             -patient may shower             -ELOS/Goals: 28-30 days, mod assist goals with PT, OT, SLP  -Continue CIR therapies including PT, OT, and SLP    -will need AFO potentially for RLE 2.  Impaired mobility: -DVT/anticoagulation:  Pharmaceutical: d/c Lovenox             -antiplatelet therapy:  N/a 3. Right lower extremity dysesthetic pain:              -pt denied any back pain today and there was none one exam either. Exam most consistent with sciatic nerve injury in the proximal right thigh which is entirely possible given his rhabdomyolysis/down-time. I don't think it's related to his ABI either. Continue Oxycodone BID with tylenol prn.              -6/20: increase lyrica to 100mg  BID    -have reviewed massage, desensitization also 4. Mood/sleep:  team providing ego support -continue trazodone 100mg  qhs for sleep              -antipsychotic agents: N/A 5. Neuropsych: This patient is not fully capable of making decisions on his own behalf. 6. Skin/Wound Care: Pressure relief measures. Will add juven for supplement (tastes better than prosource)             --continue vitamin C and Zinc. 7. Fluids/Electrolytes/Nutrition:  Monitor I/O. Stable 6/13, monitor weekly 8. Polysubstance abuse/Hypoxic encephalopathy: Has completed clonidine taper to help with withdrawal             --Oxycodone has been weaned down to 5 mg bid as off on 05/30.               --  Continue Seroquel 100 mg hs/50 mg daily. Monitor sleep wake cycle.             --a little impulsive but otherwise quite pleasant demeanor.  9. Aspiration PNA: Completed IV Vanc on 05/20. Respiratory status stable 10. Acute blood loss anemia: Will continue to monitor for signs of bleeding. --is recovering, stable, repeat Hgb weekly 11. Acute renal failure: Due to rhabdomyolysis has resolved with improvement in K-159 12. Blood pressure: Intermittent elevation noted.             --pain component  -if persistent may need to treat 13. Hypokalemia: Was supplemented. Po intake reported to be poor and refusing supplements.             --recheck Monday 14. Thrombocytosis: Slowly trending down 15. Superficial thrombosis LUE: Local measures with elevation for edema management/ heat prn.   17. LUQ pain-   Improving, continue to monitor     LOS: 10 days A FACE TO FACE EVALUATION WAS PERFORMED  Drema Pry Wilmarie Sparlin 12/17/2020, 3:45 PM

## 2020-12-17 NOTE — Progress Notes (Signed)
Speech Language Pathology Daily Session Note  Patient Details  Name: Nathaniel Lynn MRN: [id number] Date of Birth: 1990/08/22  Today's Date: 12/17/2020 SLP Individual Time: 4098-1191 SLP Individual Time Calculation (min): 45 min  Short Term Goals: Week 2: SLP Short Term Goal 1 (Week 2): STGs=LTGs due to ELOS  Skilled Therapeutic Interventions: Pt seen for skilled ST with focus on cognitive goals, pt pleasant and cooperative with therapeutic tasks. Pt completing divided attention exercise with focus on finding/writing down important phone numbers, initially requiring mod verbal cues fading to min cues as task progressed. Pt was easily distracted throughout task, states this is his baseline, benefits from low stimulation environment. Pt also completing mildly complex problem solving/safety task related to discharge environment with 90% accuracy min A verbal cues. Pt left in bed with alarm set and all needs within reach. Cont ST POC.   Pain Pain Assessment Pain Scale: 0-10 Pain Score: 5  Pain Location: Leg  Therapy/Group: Individual Therapy  Tacey Ruiz 12/17/2020, 12:10 PM

## 2020-12-17 NOTE — Progress Notes (Signed)
Occupational Therapy Session Note  Patient Details  Name: Nathaniel Lynn MRN: [id number] Date of Birth: 25-Dec-1990  Today's Date: 12/17/2020 OT Individual Time: 1300-1415 OT Individual Time Calculation (min): 75 min    Short Term Goals: Week 2:  OT Short Term Goal 1 (Week 2): LTG=STG 2/2 ELOS  Skilled Therapeutic Interventions/Progress Updates:    Pt greeted semi-reclined in bed asleep, easy to wake and agreeable to OT treatment session. Pt declined bathing/dressing today stating he had already done that today. Pt completed bed mobility supervision, then completed squat-pivot while not weightbearing through R LE and supervision. UB strength/endurance with wc propulsion to main entrance and outside. OT educated on how to access elevator from wc. Practiced rolling wc on uneven surfaces outside. UB there-ex using level 3 green theraband. Straight arm raise, side arm raise, chest pull, triceps extension, bicep curl, and overhead pull down. LB there-ex with seated hip extension, push down on theraband to help desensitize R LE, and hip abduction with theraband. Attempted standing and weight bearing through R LE but pt unable to tolerate any weight through R LE and returned to sitting. Played GoFish in sitting with focus on cognition and explaining rules to OT student. While seated outside at picnic tables, pt stung by a bee on L knee. Pt returned to inside and nursing notified of event. Pt stated he was not allergic to bees. No difficulty breathing, but swelling noted at site of the sting.  BITS memory task with education provided on memory strategies. Pt able to get up to six words in a row. Pt returned to room and transferred back to bed with verbal cues for safety and close supervision. Bed alarm on, call bell in reach, and needs met.   Therapy Documentation Precautions:  Precautions Precautions: Fall Precaution Comments: R-side weakness and lower leg pain Restrictions Weight Bearing Restrictions:  No Other Position/Activity Restrictions: Pt scheduled for CT scan of R foot to r/o pathology, attempted to maintain NWB but pt unable to maintain, but appears TDWB only.  Pain: Pain Assessment Pain Scale: 0-10 Pain Score: 5  Pain Location: Leg    Therapy/Group: Individual Therapy  Valma Cava 12/17/2020, 2:03 PM

## 2020-12-18 NOTE — Progress Notes (Signed)
Physical Therapy Session Note  Patient Details  Name: Nathaniel Lynn MRN: [id number] Date of Birth: June 03, 1991  Today's Date: 12/18/2020 PT Individual Time: 5809-9833 PT Individual Time Calculation (min): 55 min  and Today's Date: 12/18/2020 PT Missed Time: 30 Minutes Missed Time Reason: Patient unwilling to participate  Short Term Goals: Week 2:  PT Short Term Goal 1 (Week 2): STGs = LTGs  Skilled Therapeutic Interventions/Progress Updates:     Pt received supine in bed asleep. Easily roused and agrees to therapy but reports 10/10 shooting pain in R lower leg. PT provides repositioning, rest breaks, and mobility to manage pain. Pt has shoes today and PT provides minA to help don shoes. Stand pivot transfer to [facility] with cues on positioning. PT attempts wrapping R ankle and lower leg for comfort. Initially pt reports improved pain levels but shortly after reporting increased pain and requesting removal of ace wrap. Pt practices stair training with bilateral axillary crutches. PT provides demo initially. Pt then performs repeated reps of x1 6" step going forward up and backward down step. PT provides minA due to slight LOBs backward and difficulty consistently sequencing steps navigation. Following seated rest break, pt performs repetitions of x2 steps up and back with similar assistance and cues.   Pt transfers to mat table with cues on positioning. Sit to prone independently. Pt positioned for soft tissue stretch of R hip flexor and quad due to pt report of increasing pain in related area and pt's tendency to stand with R hip flexed to nearly 90 degrees. PT places wedge under R thigh to increase hip extension and hip flexor lengthening. Manual pressure provided at PSIS for optimal mechanics. Pt begins to verbalize increase in discomfort in R leg so pt is encouraged to transition to supine. In supine pt perform 2x5 bridges with cues on correct performance.   Stand pivot to WC. Pt self propels WC  x150' back to room. Left supine in bed with alarm intact and all needs within reach.  2nd Session: Pt requests to rest due to recently having "fall" and "feeling like crap". PT will follow up as appropriate.  Therapy Documentation Precautions:  Precautions Precautions: Fall Precaution Comments: R-side weakness and lower leg pain Restrictions Weight Bearing Restrictions: No Other Position/Activity Restrictions: Pt scheduled for CT scan of R foot to r/o pathology, attempted to maintain NWB but pt unable to maintain, but appears TDWB only.    Therapy/Group: Individual Therapy  Beau Fanny, PT, DPT 12/18/2020, 3:05 PM

## 2020-12-18 NOTE — Progress Notes (Signed)
Patient ID: Nathaniel Lynn, male   DOB: [birth year], 30 y.o.   MRN: [id number]  SW met with pt in room to provide updates from team conference on d/c date remaining 6/24, and d/c recs: 3in1 BSC, and large crutches, and Outpatient therapies PT/OT/SLP. Prefers [place] since closer towards their home. Pt made efforts to call his mother several times while in the room but no answer. Pt aware SW to follow-up with his mother.   31- SW made contact with pt mother [name] [phone]) to discuss above. She confirms pt will have 24/7 care from his sister since she will be at home while she/husband are at work. The sister works 3rd shift. States pt will not need a 3in1 BSC as they have access to one. Would like crutches to be ordered to hospital (SW informed these items will be billed to him). When discussing family edu, reports she can only be here on Friday since this is the day she is off. SW will discuss with therapy team if this is needed. SW will set up outpatient at [facility].   Loralee Pacas, MSW, [place] Office: [phone] Cell: [phone] Fax: [phone]

## 2020-12-18 NOTE — Progress Notes (Signed)
PROGRESS NOTE   Subjective/Complaints: Sleepy- said "one more minute" and then kept falling asleep.    ROS:  Pt denies SOB, abd pain, CP, N/V/C/D, and vision changes     No results found. No results for input(s): WBC, HGB, HCT, PLT in the last 72 hours.  No results for input(s): NA, K, CL, CO2, GLUCOSE, BUN, CREATININE, CALCIUM in the last 72 hours.   Intake/Output Summary (Last 24 hours) at 12/18/2020 1025 Last data filed at 12/18/2020 0750 Gross per 24 hour  Intake 840 ml  Output --  Net 840 ml        Physical Exam: Vital Signs Blood pressure (!) 142/86, pulse 99, temperature 98.8 F (37.1 C), temperature source Oral, resp. rate 16, height 5\' 10"  (1.778 m), weight 75.3 kg, SpO2 99 %.   General: sleepy; woke briefly-  NAD HENT: conjugate gaze; oropharynx moist CV: borderline tachycardia; rate; no JVD Pulmonary: CTA B/L; no W/R/R- good air movement GI: soft, NT, ND, (+)BS Psychiatric: appropriate but sleepy;  Neurological: overall alert, but sleepy  Skin: No evidence of breakdown, no evidence of rash Neurologic: Cranial nerves II through XII intact, motor strength is 5/5 in bilateral deltoid, bicep, tricep, grip, hip flexor, knee extensors, ankle dorsiflexor and plantar flexor Sensory exam normal sensation to light touch and proprioception in bilateral upper and lower extremities Cerebellar exam normal finger to nose to finger as well as heel to shin in bilateral upper and lower extremities Musculoskeletal: Full range of motion in all 4 extremities. No joint swelling  Musculoskeletal:        General: right foot/lower leg less sensitive to touch/ROM today    Cervical back: Normal range of motion. Skin:    General: Skin is warm.    Comments: Multiple healed scars  RLE.   Neurological:    Comments: improving awareness. Still somewhat distracted. No focal CN findings. UE motor 5/5. LLE 4-5/5. RLE limited  distally d/t pain. Pt with sensory loss in rgiht leg and foot. Has weakness in sciatic muscles, esp peroneal div. Pt has 5/5 HAD and HAB. 0/5 ADF and 2/5 APF. DTR's 1+ to absent  Patient has hypersensitivity on the top and bottom of the foot.   Assessment/Plan: 1. Functional deficits which require 3+ hours per day of interdisciplinary therapy in a comprehensive inpatient rehab setting. Physiatrist is providing close team supervision and 24 hour management of active medical problems listed below. Physiatrist and rehab team continue to assess barriers to discharge/monitor patient progress toward functional and medical goals  Care Tool:  Bathing    Body parts bathed by patient: Right arm, Left arm, Chest, Abdomen, Front perineal area, Buttocks, Right upper leg, Left upper leg, Face, Left lower leg, Right lower leg   Body parts bathed by helper: Right lower leg     Bathing assist Assist Level: Contact Guard/Touching assist     Upper Body Dressing/Undressing Upper body dressing   What is the patient wearing?: Pull over shirt    Upper body assist Assist Level: Set up assist    Lower Body Dressing/Undressing Lower body dressing      What is the patient wearing?: Underwear/pull up, Pants  Lower body assist Assist for lower body dressing: Contact Guard/Touching assist     Toileting Toileting    Toileting assist Assist for toileting: Contact Guard/Touching assist     Transfers Chair/bed transfer  Transfers assist     Chair/bed transfer assist level: Supervision/Verbal cueing     Locomotion Ambulation   Ambulation assist   Ambulation activity did not occur: Safety/medical concerns (pt scheduled for CT scan R foot r/o pathology)          Walk 10 feet activity   Assist           Walk 50 feet activity   Assist           Walk 150 feet activity   Assist           Walk 10 feet on uneven surface  activity   Assist Walk 10 feet on uneven  surfaces activity did not occur: Safety/medical concerns         Wheelchair     Assist Will patient use wheelchair at discharge?: No Type of Wheelchair: Manual    Wheelchair assist level: Moderate Assistance - Patient 50 - 74% Max wheelchair distance: 150    Wheelchair 50 feet with 2 turns activity    Assist        Assist Level: Moderate Assistance - Patient 50 - 74%   Wheelchair 150 feet activity     Assist      Assist Level: Moderate Assistance - Patient 50 - 74%   Blood pressure (!) 142/86, pulse 99, temperature 98.8 F (37.1 C), temperature source Oral, resp. rate 16, height 5\' 10"  (1.778 m), weight 75.3 kg, SpO2 99 %.  Medical Problem List and Plan: 1.   Functional deficits secondary to anoxic brain injury and rhabdomyolysis. 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             -patient may shower             -ELOS/Goals: 28-30 days, mod assist goals with PT, OT, SLP  -Continue CIR- PT, OT and SLP   -will need AFO potentially for RLE 2.  Impaired mobility: -DVT/anticoagulation:  Pharmaceutical: d/c Lovenox             -antiplatelet therapy:  N/a 3. Right lower extremity dysesthetic pain:              -pt denied any back pain today and there was none one exam either. Exam most consistent with sciatic nerve injury in the proximal right thigh which is entirely possible given his rhabdomyolysis/down-time. I don't think it's related to his ABI either. Continue Oxycodone BID with tylenol prn.              -6/20: increase lyrica to 100mg  BID    -have reviewed massage, desensitization also 4. Mood/sleep:  team providing ego support -continue trazodone 100mg  qhs for sleep              -antipsychotic agents: N/A 5. Neuropsych: This patient is not fully capable of making decisions on his own behalf. 6. Skin/Wound Care: Pressure relief measures. Will add juven for supplement (tastes better than prosource)             --continue vitamin C  and Zinc. 7. Fluids/Electrolytes/Nutrition: Monitor I/O. Stable 6/13, monitor weekly 8. Polysubstance abuse/Hypoxic encephalopathy: Has completed clonidine taper to help with withdrawal             --Oxycodone has been weaned down to  5 mg bid as off on 05/30.               --Continue Seroquel 100 mg hs/50 mg daily. Monitor sleep wake cycle.             --a little impulsive but otherwise quite pleasant demeanor.  9. Aspiration PNA: Completed IV Vanc on 05/20. Respiratory status stable 10. Acute blood loss anemia: Will continue to monitor for signs of bleeding. --is recovering, stable, repeat Hgb weekly 11. Acute renal failure: Due to rhabdomyolysis has resolved with improvement in K-159  6/21- Last checked 6/13- but had resolved.  12. Blood pressure: Intermittent elevation noted.             --pain component  -if persistent may need to treat  6/21- BP very slightly elevated- will monitor for trend.  13. Hypokalemia: Was supplemented. Po intake reported to be poor and refusing supplements.             --recheck Monday 14. Thrombocytosis: Slowly trending down 15. Superficial thrombosis LUE: Local measures with elevation for edema management/ heat prn.   17. LUQ pain-   Improving, continue to monitor  6/21- improving- no complaints today- con't regimen     LOS: 11 days A FACE TO FACE EVALUATION WAS PERFORMED  Nathaniel Lynn 12/18/2020, 10:25 AM

## 2020-12-18 NOTE — Progress Notes (Signed)
Occupational Therapy Session Note  Patient Details  Name: Nathaniel Lynn MRN: [id number] Date of Birth: [birth year]  Today's Date: 12/18/2020 Session 1 OT Individual Time: 1003-1030 OT Individual Time Calculation (min): 27 min   Session 2 OT Individual Time: 1322-1405 OT Individual Time Calculation (min): 43 min   Short Term Goals: Week 2:  OT Short Term Goal 1 (Week 2): LTG=STG 2/2 ELOS  Skilled Therapeutic Interventions/Progress Updates:  Session 1: Met pt lying supine in bed asleep, bed alarm on, pt agreed to session. Pt complained of 7/10 pain in R leg, nurse brought pain meds. Treatment session was focused on maintaining weight bearing during activities to increase activity tolerance. Pt was independent for lying > sitting EOB transfer. Pt donned shoes with supervision. Pt ambulated by hopping with RW to sink to complete grooming. Pt was CGA for standing at sink to complete grooming tasks due to loss of balance. While standing at sink, pt tolerated weight bearing only through R toes. Pt self-propelled to dayroom to complete weight bearing activity. Pt was not able to stand throughout activity due to extreme pain in R leg. Pt used problem-solving skills to play connect 4 with OTS while seated. Pt self-propelled back to room. OTS provided PROM (flexion and extension) to R ankle and foot, pt would grimace when pain became uncomfortable. Pt will continue to benefit from education on breathing techniques while in pain. Left pt sitting in bed, bed alarm on, all needs met.   Session 2: Met pt asleep in prone, bed alarm on, pt agreed to session. Treatment session was focused on self-care retraining and weight bearing on RLE. Pt was Ind for bed mobility to EOB. Pt ambulated to bathroom using RW with CGA. Pt doffed all clothing while seated on shower chair. Pt completed UB and LB bathing while seated with distant supervision. Pt began donning clothing and stood up in shower. Pt was not able to correct  balance due to painful RLE and fell in shower. OTS protected pt's head and prevented further injury. OT provided education on safety during standing during bathing and dressing tasks. Pt donned UB dressing with set-up and CGA for LB dressing. Pt self-propelled to ortho gym to complete weight bearing activity. OTS instructed pt to place R foot on selected number mats. Pt tolerated weight bearing throughout entire activity and followed pattern. Pt self-propelled back to bed, CGA for squat pivot from w/c > bed. Left pt lying in bed, bed alarm on, all needs met.   Therapy Documentation Precautions:  Precautions Precautions: Fall Precaution Comments: R-side weakness and lower leg pain Restrictions Weight Bearing Restrictions: No Other Position/Activity Restrictions: Pt scheduled for CT scan of R foot to r/o pathology, attempted to maintain NWB but pt unable to maintain, but appears TDWB only. Pain: Pain Assessment Pain Scale: 0-10 Pain Score: 7  Pain Type: Neuropathic pain Pain Location: Foot Pain Orientation: Right Pain Descriptors / Indicators: Aching Pain Onset: On-going Patients Stated Pain Goal: 0 Pain Intervention(s): Repositioned;Medication (See eMAR)  Therapy/Group: Individual Therapy  Bayard Males 12/18/2020, 2:31 PM

## 2020-12-18 NOTE — Progress Notes (Signed)
   12/18/20 1429  What Happened  Was fall witnessed? Yes  Who witnessed fall? Physical Therapy  Patients activity before fall ambulating-assisted  Point of contact buttocks  Was patient injured? No  Adult Fall Risk Assessment  Risk Factor Category (scoring not indicated) Fall has occurred during this admission (document High fall risk)  Age 30  Fall History: Fall within 6 months prior to admission 5  Elimination; Bowel and/or Urine Incontinence 0  Elimination; Bowel and/or Urine Urgency/Frequency 0  Medications: includes PCA/Opiates, Anti-convulsants, Anti-hypertensives, Diuretics, Hypnotics, Laxatives, Sedatives, and Psychotropics 5  Patient Care Equipment 2  Mobility-Assistance 2  Mobility-Gait 2  Mobility-Sensory Deficit 0  Altered awareness of immediate physical environment 0  Impulsiveness 0  Lack of understanding of one's physical/cognitive limitations 0  Total Score 16  Patient Fall Risk Level High fall risk  Adult Fall Risk Interventions  Required Bundle Interventions *See Row Information* High fall risk - low, moderate, and high requirements implemented  Additional Interventions Use of appropriate toileting equipment (bedpan, BSC, etc.)  Vitals  Temp (!) 97.4 F (36.3 C)  Temp Source Oral  BP 136/79  MAP (mmHg) 95  BP Location Right Arm  BP Method Automatic  Patient Position (if appropriate) Lying  Pulse Rate 93  Resp 17  Oxygen Therapy  SpO2 100 %  O2 Device Room Air  Pain Assessment  Pain Scale 0-10  Pain Score 0  PCA/Epidural/Spinal Assessment  Respiratory Pattern Regular;Unlabored  Neurological  Neuro (WDL) X  Level of Consciousness Alert  Cognition Appropriate at baseline;Follows commands  Speech Clear  Pupil Assessment  No  Glasgow Coma Scale  Eye Opening 4  Best Verbal Response (NON-intubated) 5  Best Motor Response 6  Glasgow Coma Scale Score 15  Musculoskeletal  Musculoskeletal (WDL) X  Assistive Device Front wheel walker  Generalized  Weakness Yes  Weight Bearing Restrictions No  Integumentary  Integumentary (WDL) X  Skin Color Appropriate for ethnicity  Skin Condition Dry  Skin Integrity Intact;Abrasion  Abrasion Location Ear  Abrasion Location Orientation Left

## 2020-12-18 NOTE — Progress Notes (Signed)
Speech Language Pathology Daily Session Note  Patient Details  Name: Nathaniel Lynn MRN: [id number] Date of Birth: February 27, 1991  Today's Date: 12/18/2020 SLP Individual Time: 1100-1130 SLP Individual Time Calculation (min): 30 min  Short Term Goals: Week 2: SLP Short Term Goal 1 (Week 2): STGs=LTGs due to ELOS  Skilled Therapeutic Interventions: Skilled ST treatment performed with focus on cognitive goals. Patient initially complained of significant pain in R lower extremity which caused intermittent distractibility during session. Patient stated that cognitive activities helped direct his focus away from the discomfort for short periods of time. SLP facilitated selective attention, short-term recall, and and processing task using novel card game with mod A verbal/visual cues initially progressing to min A verbal cues for recall and processing of task instructions. Patient was left in bed with alarm activated and needs within reach. Continue with current ST POC.     Pain Pain Assessment Pain Scale: 0-10 Pain Score: 7  Pain Type: Neuropathic pain Pain Location: Foot Pain Orientation: Right Pain Descriptors / Indicators: Aching Pain Onset: On-going Patients Stated Pain Goal: 0 Pain Intervention(s): Repositioned;Medication (See eMAR)  Therapy/Group: Individual Therapy  Tamala Ser 12/18/2020, 12:48 PM

## 2020-12-18 NOTE — Patient Care Conference (Signed)
Inpatient RehabilitationTeam Conference and Plan of Care Update Date: 12/18/2020   Time: 10:50 AM    Patient Name: Nathaniel Lynn      Medical Record Number: [id number]  Date of Birth: 1991/03/02 Sex: Male         Room/Bed: 4W12C/4W12C-02 Payor Info: Payor: /    Admit Date/Time:  12/07/2020  2:38 PM  Primary Diagnosis:  Anoxic brain injury [facility])  Hospital Problems: Principal Problem:   Anoxic brain injury [facility])    Expected Discharge Date: Expected Discharge Date: 12/21/20  Team Members Present: Physician leading conference: Dr. Genice Rouge Care Coodinator Present: Cecile Sheerer, LCSWA;Maurie Olesen Marlyne Beards, RN, BSN, CRRN Nurse Present: Kennyth Arnold, RN PT Present: Malachi Pro, PT OT Present: Kearney Hard, OT SLP Present: Other (comment) Sonny Masters, SLP) PPS Coordinator present : Fae Pippin, SLP     Current Status/Progress Goal Weekly Team Focus  Bowel/Bladder   Continent of B/B. LBM 12/17/2020  Regular BMs every shift and PRN  Assess B/B every shift and PRN   Swallow/Nutrition/ Hydration             ADL's   Supervision/CGA  supervision  self-care retraining, R LE sensitivity, balance, safety, dc planning   Mobility   supervision transfers, CGA ambulation >200' with axillary crutches, minA x2 steps with axillary crutches  supervision  DC prep, stairs, ambulation   Communication             Safety/Cognition/ Behavioral Observations  Min-to-mod A  Min A  functional problem solving, sustained attention, recall, emergent awareness   Pain   Pain to right leg 10/10 before scheduled oxycodone  Pain scale <3/10  Assess pain every shift and PRN   Skin   Abrasian to left ear  No new skin breakdown  Assess skin every shift and PRN     Discharge Planning:  Pt is uninsured. Pt will d/c to home back with his parents. Will confirm there are no barriers to d/c.   Team Discussion: BP borderline high, possible AFO? No pain reported to MD, very sleepy, would not  awaken for doctor. Continent B/B, denies pain, abrasion to left ear.   Patient on target to meet rehab goals: yes, supervision/contact guard assist due to pain.  Tolerating fabric better and did put shoe on. RLE hurts bad, crutches work well for him. Can do stairs with min assist. SLP reports min assist to supervision. Supervision goals, pain is limiting due to distraction.  *See Care Plan and progress notes for long and short-term goals.   Revisions to Treatment Plan:  Not at this time.  Teaching Needs: Family education, medication management, pain management, skin/wound care, transfer training, gait training, balance training, endurance training, weight bearing precautions, safety awareness.  Current Barriers to Discharge: Decreased caregiver support, Medical stability, Home enviroment access/layout, Wound care, Lack of/limited family support, Weight bearing restrictions, Medication compliance, Behavior, and Nutritional means  Possible Resolutions to Barriers: Continue current medications, provide emotional support.     Medical Summary Current Status: continent B/B; denies pain at rest- but has a lot of pain with movement; ; L ear abrasion; veyr sleepy in the morning/ all day- sleeps a LOT- wakes up for therapy  Barriers to Discharge: Behavior;Decreased family/caregiver support;Home enviroment access/layout;Weight bearing restrictions;Nutrition means;Other (comments)  Barriers to Discharge Comments: d/c to parents home; uninsured- nerve pain RLE; using crutches for RLE - PT doesn't think would use AFO! used ACE wrap for 5 minutes Possible Resolutions to [employer] Focus: focus- sedation; won't put RLE down  due to pain; sensitization training with therapy; perseverates RLE pain; d/c-Friday 6/24   Continued Need for Acute Rehabilitation Level of Care: The patient requires daily medical management by a physician with specialized training in physical medicine and rehabilitation for the  following reasons: Direction of a multidisciplinary physical rehabilitation program to maximize functional independence : Yes Medical management of patient stability for increased activity during participation in an intensive rehabilitation regime.: Yes Analysis of laboratory values and/or radiology reports with any subsequent need for medication adjustment and/or medical intervention. : Yes   I attest that I was present, lead the team conference, and concur with the assessment and plan of the team.   Tennis Must 12/18/2020, 5:03 PM

## 2020-12-19 NOTE — Progress Notes (Signed)
PROGRESS NOTE   Subjective/Complaints:  RLE pain improving    ROS:  Pt denies SOB, abd pain, CP, N/V/C/D, and vision changes     No results found. No results for input(s): WBC, HGB, HCT, PLT in the last 72 hours.  No results for input(s): NA, K, CL, CO2, GLUCOSE, BUN, CREATININE, CALCIUM in the last 72 hours.   Intake/Output Summary (Last 24 hours) at 12/19/2020 0948 Last data filed at 12/18/2020 1821 Gross per 24 hour  Intake 480 ml  Output --  Net 480 ml         Physical Exam: Vital Signs Blood pressure (!) 137/91, pulse (!) 103, temperature 98.6 F (37 C), resp. rate 18, height 5\' 10"  (1.778 m), weight 75.3 kg, SpO2 99 %.  General: No acute distress Mood and affect are appropriate Heart: Regular rate and rhythm no rubs murmurs or extra sounds Lungs: Clear to auscultation, breathing unlabored, no rales or wheezes Abdomen: Positive bowel sounds, soft nontender to palpation, nondistended Extremities: No clubbing, cyanosis, or edema  Skin: No evidence of breakdown, no evidence of rash Neurologic: Cranial nerves II through XII intact, motor strength is 5/5 in bilateral deltoid, bicep, tricep, grip, hip flexor, knee extensors, ankle dorsiflexor and plantar flexor Sensory exam normal sensation to light touch and proprioception in bilateral upper and lower extremities Cerebellar exam normal finger to nose to finger as well as heel to shin in bilateral upper and lower extremities Musculoskeletal: Full range of motion in all 4 extremities. No joint swelling  Musculoskeletal:        General: right foot/lower leg less sensitive to touch/ROM today    Cervical back: Normal range of motion. Skin:    General: Skin is warm.    Comments: Multiple healed scars  RLE.   Neurological:    Comments: improving awareness. Still somewhat distracted. No focal CN findings. UE motor 5/5. LLE 4-5/5. RLE limited distally d/t pain. Pt  with sensory loss in rgiht leg and foot. Has weakness in sciatic muscles, esp peroneal div. Pt has 5/5 HAD and HAB. 0/5 ADF and 2/5 APF. DTR's 1+ to absent  Patient has hypersensitivity on the top and bottom of the foot.   Assessment/Plan: 1. Functional deficits which require 3+ hours per day of interdisciplinary therapy in a comprehensive inpatient rehab setting. Physiatrist is providing close team supervision and 24 hour management of active medical problems listed below. Physiatrist and rehab team continue to assess barriers to discharge/monitor patient progress toward functional and medical goals  Care Tool:  Bathing    Body parts bathed by patient: Right arm, Left arm, Chest, Abdomen, Front perineal area, Buttocks, Right upper leg, Left upper leg, Face, Left lower leg, Right lower leg   Body parts bathed by helper: Right lower leg     Bathing assist Assist Level: Contact Guard/Touching assist     Upper Body Dressing/Undressing Upper body dressing   What is the patient wearing?: Pull over shirt    Upper body assist Assist Level: Set up assist    Lower Body Dressing/Undressing Lower body dressing      What is the patient wearing?: Underwear/pull up, Pants     Lower body  assist Assist for lower body dressing: Contact Guard/Touching assist     Toileting Toileting    Toileting assist Assist for toileting: Contact Guard/Touching assist     Transfers Chair/bed transfer  Transfers assist     Chair/bed transfer assist level: Supervision/Verbal cueing     Locomotion Ambulation   Ambulation assist   Ambulation activity did not occur: Safety/medical concerns (pt scheduled for CT scan R foot r/o pathology)          Walk 10 feet activity   Assist           Walk 50 feet activity   Assist           Walk 150 feet activity   Assist           Walk 10 feet on uneven surface  activity   Assist Walk 10 feet on uneven surfaces activity did  not occur: Safety/medical concerns         Wheelchair     Assist Will patient use wheelchair at discharge?: No Type of Wheelchair: Manual    Wheelchair assist level: Contact Guard/Touching assist Max wheelchair distance: 150    Wheelchair 50 feet with 2 turns activity    Assist        Assist Level: Moderate Assistance - Patient 50 - 74%   Wheelchair 150 feet activity     Assist      Assist Level: Moderate Assistance - Patient 50 - 74%   Blood pressure (!) 137/91, pulse (!) 103, temperature 98.6 F (37 C), resp. rate 18, height 5\' 10"  (1.778 m), weight 75.3 kg, SpO2 99 %.  Medical Problem List and Plan: 1.   Functional deficits secondary to anoxic brain injury and rhabdomyolysis. 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             -patient may shower             -ELOS/Goals: 28-30 days, mod assist goals with PT, OT, SLP  -Continue CIR- PT, OT and SLP   -will need AFO potentially for RLE 2.  Impaired mobility: -DVT/anticoagulation:  Pharmaceutical: d/c Lovenox             -antiplatelet therapy:  N/a 3. Right lower extremity dysesthetic pain:              -pt denied any back pain today and there was none one exam either. Exam most consistent with sciatic nerve injury in the proximal right thigh which is entirely possible given his rhabdomyolysis/down-time. I don't think it's related to his ABI either. Continue Oxycodone BID with tylenol prn.              -improved on lyrica 100mg  TID   -have reviewed massage, desensitization also 4. Mood/sleep:  team providing ego support -continue trazodone 100mg  qhs for sleep              -antipsychotic agents: N/A 5. Neuropsych: This patient is not fully capable of making decisions on his own behalf. 6. Skin/Wound Care: Pressure relief measures. Will add juven for supplement (tastes better than prosource)             --continue vitamin C and Zinc. 7. Fluids/Electrolytes/Nutrition: Monitor I/O.  Stable 6/13, monitor weekly 8. Polysubstance abuse/Hypoxic encephalopathy: Has completed clonidine taper to help with withdrawal             --Oxycodone has been weaned down to 5 mg bid as off on 05/30.               --  Continue Seroquel 100 mg hs/50 mg daily. Monitor sleep wake cycle.             --a little impulsive but otherwise quite pleasant demeanor.  9. Aspiration PNA: Completed IV Vanc on 05/20. Respiratory status stable 10. Acute blood loss anemia: Will continue to monitor for signs of bleeding. --is recovering, stable, repeat Hgb weekly 11. Acute renal failure: Due to rhabdomyolysis has resolved with improvement in K-159  6/21- Last checked 6/13- but had resolved.  12. Blood pressure: Intermittent elevation noted.        Vitals:   12/18/20 2042 12/19/20 0542  BP: 133/78 (!) 137/91  Pulse: (!) 105 (!) 103  Resp: 18 18  Temp: 98.2 F (36.8 C) 98.6 F (37 C)  SpO2: 97% 99%  Mild diastolic elevation 13. Hypokalemia: resolved with supplementation  14. Thrombocytosis: Slowly trending down 15. Superficial thrombosis LUE: Local measures with elevation for edema management/ heat prn.   17. LUQ pain-   Resolved      LOS: 12 days A FACE TO FACE EVALUATION WAS PERFORMED  Erick Colace 12/19/2020, 9:48 AM

## 2020-12-19 NOTE — Progress Notes (Signed)
Orthopedic Tech Progress Note Patient Details:  Nathaniel Lynn Jun 27, 1991 [id number]  Was called this morning to bring patient a pair of AXILLARY CRUTCHES  Ortho Devices Type of Ortho Device: Crutches Ortho Device/Splint Interventions: Adjustment   Post Interventions Patient Tolerated: Other (comment) Instructions Provided: Care of device  [name] 12/19/2020, 10:37 AM

## 2020-12-19 NOTE — Progress Notes (Signed)
Occupational Therapy Session Note  Patient Details  Name: Nathaniel Lynn MRN: [id number] Date of Birth: [birth year]  Today's Date: 12/19/2020 OT Individual Time: 6160-7371 OT Individual Time Calculation (min): 56 min    Short Term Goals: Week 1:  OT Short Term Goal 1 (Week 1): Patient will complete toilet transfer with CGA. OT Short Term Goal 1 - Progress (Week 1): Met OT Short Term Goal 2 (Week 1): Pt will don socks with min A OT Short Term Goal 2 - Progress (Week 1): Met OT Short Term Goal 3 (Week 1): Pt tolerate standing at the sink with B LEs evenly on the floor for 1 minute within grooming task OT Short Term Goal 3 - Progress (Week 1): Progressing toward goal  Skilled Therapeutic Interventions/Progress Updates:     Pt received in bed with 7 out of 10 pain in R foot. Minor pain in R hip- more soreness. Repositioning and rest provided for pain relief. RN alerted to pain to deliver medications when appropriate. Pt declines ice or heat for pain relief- makes it feel "worse"  ADL:  Pt completes bathing with set up for UB; CGA for LB at sit to stand level Pt completes UB dressing with set up Pt completes LB dressing with CGA at sit to stand level with VC for 1UE support on grab bar/walker/sink during clothing amangemnet  Pt completes footwear with set up  Pt completes shower/Tub transfer with MIN A for using RW to hop over shower ledge with multiple unsuccessful attempts however no LOB. Pt continues to state, "it will be easier with crutches." Edu re walker being better for this transfer d/t stabiliaty/BOS  Therapeutic exercise UB stretching HEP provided with brief review and handout for memory. Pt verbalized understanding.  Access Code: [email or web address] URL: [email or web address] Date: 12/19/2020 Prepared by: Mariane Masters  Exercises Sleeper Stretch - 1 x daily - 7 x weekly - 3 sets - 10 reps Standing Shoulder Posterior Capsule Stretch - 1 x daily - 7 x weekly - 3  sets - 10 reps Supine Chest Stretch with Elbows Bent - 1 x daily - 7 x weekly - 3 sets - 10 reps Standing Lower Cervical and Upper Thoracic Stretch - 1 x daily - 7 x weekly - 3 sets - 10 reps Chest and Bicep Stretch - Arms Behind Back - 1 x daily - 7 x weekly - 3 sets - 10 reps Standing Overhead Triceps Stretch - 1 x daily - 7 x weekly - 3 sets - 10 reps    Therapeutic activity W/c propulsion to outside courtyard, up/down long ramp, and functional mobility with RW up/down ramp for simulated community reentry and CGA-S overall. VC for anterior weight shift when pushing up ramp.  Pt left at end of session in bed with exit alarm on, call light in reach and all needs met   Therapy Documentation Precautions:  Precautions Precautions: Fall Precaution Comments: R-side weakness and lower leg pain Restrictions Weight Bearing Restrictions: No Other Position/Activity Restrictions: Pt scheduled for CT scan of R foot to r/o pathology, attempted to maintain NWB but pt unable to maintain, but appears TDWB only. General:   Vital Signs: Therapy Vitals Temp: 98.6 F (37 C) Pulse Rate: (!) 103 Resp: 18 BP: (!) 137/91 Patient Position (if appropriate): Lying Oxygen Therapy SpO2: 99 % O2 Device: Room Air Pain:   ADL: ADL Eating: Set up Grooming: Minimal assistance Upper Body Bathing: Minimal assistance Lower Body Bathing: Moderate assistance Upper Body Dressing: Minimal assistance Lower Body  Dressing: Moderate assistance Toileting: Moderate assistance Toilet Transfer: Minimal assistance Social research officer, government: Minimal assistance Vision   Perception    Praxis   Exercises:   Other Treatments:     Therapy/Group: Individual Therapy  Tonny Branch 12/19/2020, 6:51 AM

## 2020-12-19 NOTE — Progress Notes (Signed)
Speech Language Pathology Daily Session Note  Patient Details  Name: LEANTHONY RHETT MRN: [id number] Date of Birth: 11-05-1990  Today's Date: 12/19/2020 SLP Individual Time: 1001-1046 SLP Individual Time Calculation (min): 45 min  Short Term Goals: Week 2: SLP Short Term Goal 1 (Week 2): STGs=LTGs due to ELOS  Skilled Therapeutic Interventions: Pt seen for skilled ST with focus on cognitive goals and family education. Pt sister present throughout tx session as pt mother is working, very receptive to education. Discussed pt current cognitive deficits including attention, safety and insight. Pt participating in safety awareness activity for home environment benefiting from min A verbal cues. Discussed potential hazard recognition, fall prevention, keeping dogs away from patient during any mobility, keeping floor clear of toys and recommendation for no alcohol, drugs, driving or firearms. Pt and sister verbalize understanding. Pt procedural memory appears to be functional for daily living tasks and pt/sister endorse close to baseline cognition at this time. Pt observed operating smart phone with no difficulties. SLP recommending 24/7 supervision which sister says will be provided. Pt left in bed with alarm set and sister present, cont ST POC.   Pain Pain Assessment Pain Scale: 0-10 Pain Score: 8  Pain Type: Acute pain Pain Location: Leg Pain Orientation: Right Pain Frequency: Constant Pain Intervention(s): Medication (See eMAR)  Therapy/Group: Individual Therapy  Tacey Ruiz 12/19/2020, 12:44 PM

## 2020-12-19 NOTE — Progress Notes (Signed)
Patient ID: Nathaniel Lynn, male   DOB: [birth year], 30 y.o.   MRN: [id number]  SW returned phone call/left message for pt mother [name] to inform that in-service family education is not needed, and if her daughter was available to come in on Thursday to call before 12pm so this can be placed on the schedule for 10am-12pm.  SW ordered large axillary crutches with ortho. SW faxed outpatient PT/OT/SLP order to  Outpatient ([phone]).  *SW spoke with pt mother [name] who reported that pt sister was here today. SW met with pt and sister in room during SLP session and asked pt sister to stay for as many sessions as possible today.   Loralee Pacas, MSW, [place] Office: [phone] Cell: [phone] Fax: [phone]

## 2020-12-19 NOTE — Progress Notes (Signed)
Physical Therapy Session Note  Patient Details  Name: Nathaniel Lynn MRN: [id number] Date of Birth: Jul 28, 1990  Today's Date: 12/19/2020 PT Individual Time: 1402-1527 PT Individual Time Calculation (min): 85 min   Short Term Goals: Week 2:  PT Short Term Goal 1 (Week 2): STGs = LTGs  Skilled Therapeutic Interventions/Progress Updates:     Pt received supine in bed and agrees to therapy. Sister present for family education. Pt reports pain in R lower leg. Number not provided but pt reports pain improved since "putting weight on it" yesterday during loss of balance. Stand pivot transfer to [facility] with set-up assistance. PT educates sister on safe guarding of pt during mobility including guarding while performing stairs and during ambulation. WC transport to gym for time management. PT educates pt's sister on cueing pt to utilize axillary crutches with proper body mechanics to prevent brachial plexus injury. Pt performs several bouts of stair training with max number of stairs x4 with axillary crutches and CGA, with PT providing verbal cues on crutch sequencing and posture for safety.   Pt performs floor transfer with PT providing education on fall safety and optimal sequencing. Pt completes with supervision.  Pt completes soft tissue stretches for bilateral upper extremities and trapezius muscles due to heavy use with axillary crutches. Student occupational therapist cues pt for exercises with PT providing education on correct performance, including mechanics, frequency, and duration of each exercise.  Pt performs gait training with RW and then axillary crutches, with focus on increasing WB through R lower extremity. Pt is very hesistant to put any weight through R foot but with ongoing education and distraction pt appear to put majority of body weight through R foot. Pt does not demonstrate any active dorsiflexion, however, and is unable to complete heel strike at initial contact with R foot. 3x100'  with RW and crutches.  Pt performs NMR on mat in supine with bilateral lower extremity bridging progressing to alternating single leg bridges. Pt is able to clear hips with both legs individually. Pt also educated on importance of maintaining R leg in extension at rest to prevent contracture, as pt reports pain in R hamstring region when trying to straight knee. PT provides soft tissue stretch for R hamstring with towel roll under pt's R ankle. Pt reports improved pain following stretch.  Pt performs multiple reps of sit to stand with focus equal WB between feet. PT uses various strategies including propping L foot on stool and cueing pt to keep R shoulder and hip in contact with wall when performing transfer. Pt is able to complete and appears to be putting equal weight through both legs.   Pt propels WC back to room independently. Left supine with alarm intact and all needs within reach.  Therapy Documentation Precautions:  Precautions Precautions: Fall Precaution Comments: R-side weakness and lower leg pain Restrictions Weight Bearing Restrictions: No Other Position/Activity Restrictions: Pt scheduled for CT scan of R foot to r/o pathology, attempted to maintain NWB but pt unable to maintain, but appears TDWB only.    Therapy/Group: Individual Therapy  Beau Fanny, PT, DPT 12/19/2020, 3:42 PM

## 2020-12-20 ENCOUNTER — Other Ambulatory Visit (HOSPITAL_COMMUNITY): Payer: Self-pay

## 2020-12-20 MED ORDER — CERTAVITE/ANTIOXIDANTS PO TABS
1.0000 | ORAL_TABLET | Freq: Every day | ORAL | 0 refills | Status: AC
  Filled 2020-12-20: qty 30, 30d supply, fill #0

## 2020-12-20 MED ORDER — ACETAMINOPHEN 325 MG PO TABS
325.0000 mg | ORAL_TABLET | ORAL | 0 refills | Status: AC | PRN
  Filled 2020-12-20: qty 60, 5d supply, fill #0

## 2020-12-20 MED ORDER — POLYVINYL ALCOHOL 1.4 % OP SOLN
1.0000 [drp] | Freq: Three times a day (TID) | OPHTHALMIC | 0 refills | Status: AC
  Filled 2020-12-20: qty 15, 100d supply, fill #0

## 2020-12-20 MED ORDER — METHOCARBAMOL 500 MG PO TABS
500.0000 mg | ORAL_TABLET | Freq: Four times a day (QID) | ORAL | 0 refills | Status: AC | PRN
  Filled 2020-12-20: qty 90, 23d supply, fill #0

## 2020-12-20 MED ORDER — ZINC SULFATE 220 (50 ZN) MG PO TABS
220.0000 mg | ORAL_TABLET | Freq: Every day | ORAL | 0 refills | Status: AC
  Filled 2020-12-20: qty 30, 30d supply, fill #0

## 2020-12-20 MED ORDER — PREGABALIN 100 MG PO CAPS
100.0000 mg | ORAL_CAPSULE | Freq: Three times a day (TID) | ORAL | 0 refills | Status: AC
  Filled 2020-12-20: qty 90, 30d supply, fill #0

## 2020-12-20 MED ORDER — OXYCODONE HCL 5 MG PO TABS
5.0000 mg | ORAL_TABLET | Freq: Two times a day (BID) | ORAL | 0 refills | Status: DC
  Filled 2020-12-20: qty 10, 5d supply, fill #0

## 2020-12-20 MED ORDER — POLYETHYLENE GLYCOL 3350 17 GM/SCOOP PO POWD
17.0000 g | Freq: Every day | ORAL | 0 refills | Status: AC
  Filled 2020-12-20: qty 30, 30d supply, fill #0

## 2020-12-20 MED ORDER — ASCORBIC ACID 500 MG PO TABS
500.0000 mg | ORAL_TABLET | Freq: Every day | ORAL | 0 refills | Status: AC
  Filled 2020-12-20: qty 30, 30d supply, fill #0

## 2020-12-20 MED ORDER — QUETIAPINE FUMARATE 50 MG PO TABS: ORAL_TABLET | ORAL | 0 refills | Status: DC

## 2020-12-20 MED ORDER — THIAMINE HCL 100 MG PO TABS
100.0000 mg | ORAL_TABLET | Freq: Every day | ORAL | 0 refills | Status: AC
  Filled 2020-12-20: qty 30, 30d supply, fill #0

## 2020-12-20 MED ORDER — FOLIC ACID 1 MG PO TABS
1.0000 mg | ORAL_TABLET | Freq: Every day | ORAL | 0 refills | Status: AC
  Filled 2020-12-20: qty 30, 30d supply, fill #0

## 2020-12-20 MED ORDER — TRAZODONE HCL 100 MG PO TABS
100.0000 mg | ORAL_TABLET | Freq: Every day | ORAL | 0 refills | Status: AC
  Filled 2020-12-20: qty 30, 30d supply, fill #0

## 2020-12-20 NOTE — Progress Notes (Signed)
Speech Language Pathology Discharge Summary  Patient Details  Name: Nathaniel Lynn MRN: [id number] Date of Birth: 12/01/1990  Today's Date: 12/20/2020 SLP Individual Time: 0915-1015 SLP Individual Time Calculation (min): 60 min   Skilled Therapeutic Interventions: Skilled ST treatment performed with focus on cognitive goals. SLP re-administered the [employer] Mental Status Examination (SLUMS). Patient scored  20/30 points with a score of 25 or above considered normal. Difficulty most notable with short-term recall, processing complex information, and selective attention. Improvements with self monitoring, emergent awareness, and recall of functional information. Called patient's mother for education by phone, educated on results of re-assessment, progress, current areas of difficulty, and recommendations for 24/7 supervision upon discharge, and considerations to support cognitive functioning and safety. All questions were addressed. Patient was left in bed with alarm activated and needs within reach.   Patient has met 4  of  4 long term goals. Patient to discharge at overall supervision-to-min assist.   Reasons goals not met: NA    Clinical Impression/Discharge Summary: Patient exhibits improvement with cognitive functions as evidenced by score of 20/30 on SLUMS suggestive of mild impairments as compared to 12/30 at initial evaluation, which were suggestive of moderate impairment. Patient with continued difficulty in short-term recall, functional problem solving, and selective attention. Patient is discharging at supervision level-to-min assist with support from family. Recommend continued SLP services to maximize cognitive functions and independence.   Care Partner: Sister, Mother   Recommendation: Supervision-to-min assist for memory, medication management, problem solving/safety awareness.   Equipment:  NA Reasons for discharge: Treatment goals met         T   12/20/2020, 12:52 PM  

## 2020-12-20 NOTE — Progress Notes (Signed)
Patient ID: Nathaniel Lynn, male   DOB: 1991-01-30, 30 y.o.   MRN: [id number]  Pt set up for [employer] medication assistance program.  Large axillary crutches ordered with secretary and waiting for item to be delivered by ortho to the room.  *SW confirms crutches were delivered to pt room.  Cecile Sheerer, MSW, LCSWA Office: [phone] Cell: [phone] Fax: [phone]

## 2020-12-20 NOTE — Plan of Care (Signed)
  Problem: RH Problem Solving Goal: LTG Patient will demonstrate problem solving for (SLP) Description: LTG:  Patient will demonstrate problem solving for basic/complex daily situations with cues  (SLP) Outcome: Completed/Met   Problem: RH Memory Goal: LTG Patient will demonstrate ability for day to day (SLP) Description: LTG:   Patient will demonstrate ability for day to day recall/carryover during cognitive/linguistic activities with assist  (SLP) Outcome: Completed/Met Goal: LTG Patient will use memory compensatory aids to (SLP) Description: LTG:  Patient will use memory compensatory aids to recall biographical/new, daily complex information with cues (SLP) Outcome: Completed/Met   Problem: RH Attention Goal: LTG Patient will demonstrate this level of attention during functional activites (SLP) Description: LTG:  Patient will will demonstrate this level of attention during functional activites (SLP) Outcome: Completed/Met   Problem: RH Awareness Goal: LTG: Patient will demonstrate awareness during functional activites type of (SLP) Description: LTG: Patient will demonstrate awareness during functional activites type of (SLP) Outcome: Completed/Met

## 2020-12-20 NOTE — Plan of Care (Signed)
  Problem: RH Balance Goal: LTG: Patient will maintain dynamic sitting balance (OT) Description: LTG:  Patient will maintain dynamic sitting balance with assistance during activities of daily living (OT) Outcome: Completed/Met Goal: LTG Patient will maintain dynamic standing with ADLs (OT) Description: LTG:  Patient will maintain dynamic standing balance with assist during activities of daily living (OT)  Outcome: Completed/Met   Problem: Sit to Stand Goal: LTG:  Patient will perform sit to stand in prep for activites of daily living with assistance level (OT) Description: LTG:  Patient will perform sit to stand in prep for activites of daily living with assistance level (OT) Outcome: Completed/Met   Problem: RH Eating Goal: LTG Patient will perform eating w/assist, cues/equip (OT) Description: LTG: Patient will perform eating with assist, with/without cues using equipment (OT) Outcome: Completed/Met   Problem: RH Grooming Goal: LTG Patient will perform grooming w/assist,cues/equip (OT) Description: LTG: Patient will perform grooming with assist, with/without cues using equipment (OT) Outcome: Completed/Met   Problem: RH Bathing Goal: LTG Patient will bathe all body parts with assist levels (OT) Description: LTG: Patient will bathe all body parts with assist levels (OT) Outcome: Completed/Met   Problem: RH Dressing Goal: LTG Patient will perform upper body dressing (OT) Description: LTG Patient will perform upper body dressing with assist, with/without cues (OT). Outcome: Completed/Met Goal: LTG Patient will perform lower body dressing w/assist (OT) Description: LTG: Patient will perform lower body dressing with assist, with/without cues in positioning using equipment (OT) Outcome: Completed/Met   Problem: RH Toileting Goal: LTG Patient will perform toileting task (3/3 steps) with assistance level (OT) Description: LTG: Patient will perform toileting task (3/3 steps) with  assistance level (OT)  Outcome: Completed/Met   Problem: RH Functional Use of Upper Extremity Goal: LTG Patient will use RT/LT upper extremity as a (OT) Description: LTG: Patient will use right/left upper extremity as a stabilizer/gross assist/diminished/nondominant/dominant level with assist, with/without cues during functional activity (OT) Outcome: Completed/Met   Problem: RH Toilet Transfers Goal: LTG Patient will perform toilet transfers w/assist (OT) Description: LTG: Patient will perform toilet transfers with assist, with/without cues using equipment (OT) Outcome: Completed/Met   Problem: RH Tub/Shower Transfers Goal: LTG Patient will perform tub/shower transfers w/assist (OT) Description: LTG: Patient will perform tub/shower transfers with assist, with/without cues using equipment (OT) Outcome: Completed/Met   Problem: RH Awareness Goal: LTG: Patient will demonstrate awareness during functional activites type of (OT) Description: LTG: Patient will demonstrate awareness during functional activites type of (OT) Outcome: Completed/Met

## 2020-12-20 NOTE — Progress Notes (Signed)
PROGRESS NOTE   Subjective/Complaints: Pt denies issues.    ROS:   Pt denies SOB, abd pain, CP, N/V/C/D, and vision changes     No results found. No results for input(s): WBC, HGB, HCT, PLT in the last 72 hours.  No results for input(s): NA, K, CL, CO2, GLUCOSE, BUN, CREATININE, CALCIUM in the last 72 hours.   Intake/Output Summary (Last 24 hours) at 12/20/2020 0910 Last data filed at 12/20/2020 0700 Gross per 24 hour  Intake 600 ml  Output --  Net 600 ml        Physical Exam: Vital Signs Blood pressure 138/84, pulse 98, temperature 98.5 F (36.9 C), resp. rate 19, height 5\' 10"  (1.778 m), weight 75.3 kg, SpO2 100 %.    General: asleep -woke briefly- but said no issues;  NAD HENT: conjugate gaze; oropharynx moist CV: regular rate; no JVD Pulmonary: CTA B/L; no W/R/R- good air movement GI: soft, NT, ND, (+)BS Psychiatric: appropriate;sleepy Neurological: sleepy- but overall alert  Skin: No evidence of breakdown, no evidence of rash Neurologic: Cranial nerves II through XII intact, motor strength is 5/5 in bilateral deltoid, bicep, tricep, grip, hip flexor, knee extensors, ankle dorsiflexor and plantar flexor Sensory exam normal sensation to light touch and proprioception in bilateral upper and lower extremities Cerebellar exam normal finger to nose to finger as well as heel to shin in bilateral upper and lower extremities Musculoskeletal: Full range of motion in all 4 extremities. No joint swelling  Musculoskeletal:        General: right foot/lower leg less sensitive to touch/ROM today    Cervical back: Normal range of motion. Skin:    General: Skin is warm.    Comments: Multiple healed scars  RLE.   Neurological:    Comments: improving awareness. Still somewhat distracted. No focal CN findings. UE motor 5/5. LLE 4-5/5. RLE limited distally d/t pain. Pt with sensory loss in rgiht leg and foot. Has weakness  in sciatic muscles, esp peroneal div. Pt has 5/5 HAD and HAB. 0/5 ADF and 2/5 APF. DTR's 1+ to absent  Patient has hypersensitivity on the top and bottom of the foot.   Assessment/Plan: 1. Functional deficits which require 3+ hours per day of interdisciplinary therapy in a comprehensive inpatient rehab setting. Physiatrist is providing close team supervision and 24 hour management of active medical problems listed below. Physiatrist and rehab team continue to assess barriers to discharge/monitor patient progress toward functional and medical goals  Care Tool:  Bathing    Body parts bathed by patient: Right arm, Left arm, Chest, Abdomen, Front perineal area, Buttocks, Right upper leg, Left upper leg, Face, Left lower leg, Right lower leg   Body parts bathed by helper: Right lower leg     Bathing assist Assist Level: Contact Guard/Touching assist     Upper Body Dressing/Undressing Upper body dressing   What is the patient wearing?: Pull over shirt    Upper body assist Assist Level: Set up assist    Lower Body Dressing/Undressing Lower body dressing      What is the patient wearing?: Underwear/pull up, Pants     Lower body assist Assist for lower body dressing:  Contact Guard/Touching assist     Toileting Toileting    Toileting assist Assist for toileting: Contact Guard/Touching assist     Transfers Chair/bed transfer  Transfers assist     Chair/bed transfer assist level: Supervision/Verbal cueing     Locomotion Ambulation   Ambulation assist   Ambulation activity did not occur: Safety/medical concerns (pt scheduled for CT scan R foot r/o pathology)          Walk 10 feet activity   Assist           Walk 50 feet activity   Assist           Walk 150 feet activity   Assist           Walk 10 feet on uneven surface  activity   Assist Walk 10 feet on uneven surfaces activity did not occur: Safety/medical concerns          Wheelchair     Assist Will patient use wheelchair at discharge?: No Type of Wheelchair: Manual    Wheelchair assist level: Contact Guard/Touching assist Max wheelchair distance: 150    Wheelchair 50 feet with 2 turns activity    Assist        Assist Level: Moderate Assistance - Patient 50 - 74%   Wheelchair 150 feet activity     Assist      Assist Level: Moderate Assistance - Patient 50 - 74%   Blood pressure 138/84, pulse 98, temperature 98.5 F (36.9 C), resp. rate 19, height 5\' 10"  (1.778 m), weight 75.3 kg, SpO2 100 %.  Medical Problem List and Plan: 1.   Functional deficits secondary to anoxic brain injury and rhabdomyolysis. 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             -patient may shower             -ELOS/Goals: 28-30 days, mod assist goals with PT, OT, SLP   -cannot tolerate AFO per therapy due to RLE pain- con't PT, OT and SLP 2.  Impaired mobility: -DVT/anticoagulation:  Pharmaceutical: d/c Lovenox             -antiplatelet therapy:  N/a 3. Right lower extremity dysesthetic pain:              -pt denied any back pain today and there was none one exam either. Exam most consistent with sciatic nerve injury in the proximal right thigh which is entirely possible given his rhabdomyolysis/down-time. I don't think it's related to his ABI either. Continue Oxycodone BID with tylenol prn.              -improved on lyrica 100mg  TID  6/23- pain stable, but still painful- con't regimen   -have reviewed massage, desensitization also 4. Mood/sleep:  team providing ego support -continue trazodone 100mg  qhs for sleep              -antipsychotic agents: N/A 5. Neuropsych: This patient is not fully capable of making decisions on his own behalf. 6. Skin/Wound Care: Pressure relief measures. Will add juven for supplement (tastes better than prosource)             --continue vitamin C and Zinc. 7. Fluids/Electrolytes/Nutrition: Monitor  I/O. Stable 6/13, monitor weekly 8. Polysubstance abuse/Hypoxic encephalopathy: Has completed clonidine taper to help with withdrawal             --Oxycodone has been weaned down to 5 mg bid as off on 05/30.               --  Continue Seroquel 100 mg hs/50 mg daily. Monitor sleep wake cycle.             --a little impulsive but otherwise quite pleasant demeanor.  9. Aspiration PNA: Completed IV Vanc on 05/20. Respiratory status stable 10. Acute blood loss anemia: Will continue to monitor for signs of bleeding. --is recovering, stable, repeat Hgb weekly 11. Acute renal failure: Due to rhabdomyolysis has resolved with improvement in K-159  6/21- Last checked 6/13- but had resolved.  12. Blood pressure: Intermittent elevation noted.        Vitals:   12/19/20 1911 12/20/20 0526  BP: 133/85 138/84  Pulse: (!) 107 98  Resp: 18 19  Temp: 98.4 F (36.9 C) 98.5 F (36.9 C)  SpO2: 98% 100%  Mild diastolic elevation  6/23- BP controlled- HR slightly variable/up- con't regimen for now.  13. Hypokalemia: resolved with supplementation  14. Thrombocytosis: Slowly trending down 15. Superficial thrombosis LUE: Local measures with elevation for edema management/ heat prn.   17. LUQ pain-   Resolved      LOS: 13 days A FACE TO FACE EVALUATION WAS PERFORMED  Nathaniel Lynn 12/20/2020, 9:10 AM

## 2020-12-20 NOTE — Progress Notes (Signed)
Physical Therapy Discharge Summary  Patient Details  Name: Nathaniel Lynn MRN: [id number] Date of Birth: 09/22/1990  Today's Date: 12/20/2020 PT Individual Time: 1245-8099 PT Individual Time Calculation (min): 54 min    Patient has met 8 of 11 long term goals due to improved activity tolerance, improved balance, increased strength, decreased pain, improved attention, and improved awareness.  Patient to discharge at an ambulatory level Supervision.   Patient's care partner is independent to provide the necessary physical assistance at discharge.  Reasons goals not met: Pt still requiring supervision for activities requiring standing balance and CGA for stair navigation. Pt's family has been educated on pt's mobility needs and pt mobility is adequate for discharge.  Recommendation:  Patient will benefit from ongoing skilled PT services in outpatient setting to continue to advance safe functional mobility, address ongoing impairments in strength, balance, ambulation, and minimize fall risk.  Equipment: Axillary Crutches  Reasons for discharge: treatment goals met and discharge from hospital  Patient/family agrees with progress made and goals achieved: Yes  Skilled Therapeutic Interventions: Pt received supine in bed and agrees to therapy. Reports tingling pain in R lower leg. Number not provided. RN present to provide pain medication. Pt performs bed mobility independently. Squat pivot to [facility] with set-up assistance. Pt self propels WC with bilateral upper extremities independently >300' during session. Pt performs ramp navigation with axillary crutches with verbal cues on body mechanics and safe use of assistive device. Following, PT adjusts pt's crutches to taller setting to allow for improved posture, body mechanics, and safety with ambulation. Pt educated on importance of weight bearing through R lower extremity to improve strength, balance, and NM return. Pt attempts throughout session but  is very limited in amount he can tolerate, secondary to pain or perceived pain provocation. Pt performs x12 6" steps with crutches and CGA, with cues on sequencing. Pt ambulates multiple bouts with crutches, x100', x150', and x150', with PT providing verbal cues on optimal crutch placement, maintaining upright gaze to improve posture and balance, and sequencing during transition back to WC. Pt propels WC back to room. Squat pivot back to bed. Left supine with alarm intact and all needs within reach.  PT Discharge Precautions/Restrictions Precautions Precautions: Fall Restrictions Weight Bearing Restrictions: No Vision/Perception  Perception Perception: Within Functional Limits Praxis Praxis: Intact  Cognition Overall Cognitive Status: Impaired/Different from baseline Arousal/Alertness: Awake/alert Orientation Level: Oriented X4 Attention: Sustained Sustained Attention: Impaired Sustained Attention Impairment: Functional complex;Verbal complex Memory: Impaired Memory Impairment: Storage deficit;Retrieval deficit;Decreased recall of new information Awareness Impairment: Emergent impairment Problem Solving: Impaired Problem Solving Impairment: Verbal complex;Functional complex Executive Function: Reasoning;Decision Making;Self Monitoring Reasoning: Impaired Reasoning Impairment: Verbal complex;Functional complex Decision Making: Impaired Decision Making Impairment: Verbal complex;Functional complex Self Monitoring: Impaired Self Monitoring Impairment: Functional complex Safety/Judgment: Impaired Comments: improving safety awareness Sensation Sensation Light Touch: Impaired Detail Light Touch Impaired Details: Impaired RLE Additional Comments: Impaired light tough on lateral lower leg Coordination Gross Motor Movements are Fluid and Coordinated: Yes Fine Motor Movements are Fluid and Coordinated: Yes Motor  Motor Motor - Skilled Clinical Observations: Painful weight bearing  and hypersensitive R LE, foot drop on R LE as well  Mobility Bed Mobility Bed Mobility: Supine to Sit;Sit to Supine Supine to Sit: Independent Sit to Supine: Independent Transfers Transfers: Sit to Stand;Stand to [employer] Transfers Sit to Stand: Independent with assistive device Stand to Sit: Supervision/Verbal cueing Stand Pivot Transfers: Supervision/Verbal cueing Stand Pivot Transfer Details: Verbal cues for sequencing;Verbal cues for precautions/safety Transfer Wellsite geologist  device): None Locomotion  Gait Ambulation: Yes Gait Assistance: Supervision/Verbal cueing Gait Distance (Feet): 150 Feet Assistive device: Crutches Gait Assistance Details: Verbal cues for precautions/safety;Verbal cues for safe use of DME/AE;Verbal cues for sequencing Gait Gait: Yes Gait Pattern: Impaired Gait Pattern:  (Pt very hesitant to WB through R foot.) Stairs / Additional Locomotion Stairs: Yes Stairs Assistance: Contact Guard/Touching assist Number of Stairs: 12 Height of Stairs: 6 Ramp: Supervision/Verbal cueing Curb: Supervision/Verbal Location manager Mobility: Yes Wheelchair Assistance: Independent with Camera operator: Both upper extremities Distance: 300  Trunk/Postural Assessment  Cervical Assessment Cervical Assessment: Within Functional Limits Thoracic Assessment Thoracic Assessment: Within Functional Limits Lumbar Assessment Lumbar Assessment: Within Functional Limits Postural Control Postural Control: Within Functional Limits  Balance Balance Balance Assessed: Yes Static Sitting Balance Static Sitting - Balance Support: Feet supported Static Sitting - Level of Assistance: 7: Independent Dynamic Sitting Balance Dynamic Sitting - Balance Support: Feet supported Dynamic Sitting - Level of Assistance: 5: Stand by assistance Static Standing Balance Static Standing - Balance Support: During functional activity Static  Standing - Level of Assistance: 5: Stand by assistance Dynamic Standing Balance Dynamic Standing - Balance Support: During functional activity Dynamic Standing - Level of Assistance: 5: Stand by assistance Extremity Assessment  RLE Assessment General Strength Comments: Hip Flexion 4/5, Knee extension 3/5, No active dorsiflexion, Plantarflexion 4/5 LLE Assessment LLE Assessment: Within Functional Limits    Breck Coons, PT, DPT 12/20/2020, 4:07 PM

## 2020-12-20 NOTE — Progress Notes (Signed)
Occupational Therapy Discharge Summary  Patient Details  Name: Nathaniel Lynn MRN: [id number] Date of Birth: Feb 27, 1991  Today's Date: 12/20/2020 OT Individual Time: 1349-1500 OT Individual Time Calculation (min): 71 min   Pt greeted semi-reclined in bed and agreeable to OT treatment session. Pt began ambulating with crutches, but said he did not feel safe and wanted to use RW to get into shower. Focus on weight bearing while walking to transfer into shower. Bathing completed with lateral leans to wash lower extremeties and supervision Set-up/supervision for all BADL tasks, see below for further details on BADL performance. Pt brought therapy gym and was educated on desensitization strategies for R LE. Re-iterated the importance of weight bearing. Stanidng mini squats without AD to force more weight bearing through R LE. Also completed standing reaching task with horse shoe activity. Pt returned  to room at end of session and left semi-reclined in bed with bed alarm on, call bell in reach, and needs met.   Patient has met 13 of 13 long term goals due to improved activity tolerance, improved balance, postural control, ability to compensate for deficits, improved awareness, and improved coordination.  Patient to discharge at overall Supervision level.  Patient's care partner is independent to provide the necessary physical assistance at discharge for higher level iADL tasks.    Reasons goals not met: n/a  Recommendation:  Patient will benefit from ongoing skilled OT services in outpatient setting to continue to advance functional skills in the area of BADL.  Equipment: 3-in-1 BSC  Reasons for discharge: treatment goals met and discharge from hospital  Patient/family agrees with progress made and goals achieved: Yes  OT Discharge Precautions/Restrictions  Precautions Precautions: Fall Restrictions Weight Bearing Restrictions: No Pain  7/10 pain to R LE, burning pain. Respositioned and  massage for comfort ADL ADL Eating: Independent Grooming: Independent Upper Body Bathing: Independent Lower Body Bathing: Supervision/safety Upper Body Dressing: Setup Lower Body Dressing: Supervision/safety Toileting: Supervision/safety Toilet Transfer: Distant supervision Tub/Shower Transfer: Close supervison Social research officer, government: Minimal assistance Perception  Perception: Within Functional Limits Praxis Praxis: Intact Cognition Overall Cognitive Status: Impaired/Different from baseline Arousal/Alertness: Awake/alert Orientation Level: Oriented X4 Attention: Sustained Sustained Attention: Impaired Safety/Judgment: Impaired Comments: improving safety awareness Sensation Sensation Light Touch: Impaired Detail Light Touch Impaired Details: Impaired RLE Additional Comments: Impaired light tough on lateral lower leg Coordination Gross Motor Movements are Fluid and Coordinated: Yes Fine Motor Movements are Fluid and Coordinated: Yes Motor  Motor Motor - Skilled Clinical Observations: Painful weight bearing and hypersensitive R LE, foot drop on R LE as well Mobility  Bed Mobility Bed Mobility: Supine to Sit;Sit to Supine Supine to Sit: Independent Sit to Supine: Independent Transfers Sit to Stand: Independent with assistive device Stand to Sit: Supervision/Verbal cueing  Balance Static Sitting Balance Static Sitting - Balance Support: Feet supported Static Sitting - Level of Assistance: 7: Independent Dynamic Sitting Balance Dynamic Sitting - Balance Support: Feet supported Dynamic Sitting - Level of Assistance: 5: Stand by assistance Static Standing Balance Static Standing - Balance Support: During functional activity Dynamic Standing Balance Dynamic Standing - Balance Support: During functional activity Dynamic Standing - Level of Assistance: 5: Stand by assistance Extremity/Trunk Assessment RUE Assessment RUE Assessment: Within Functional Limits LUE  Assessment LUE Assessment: Within Functional Limits   Daneen Schick Parker Sawatzky 12/20/2020, 2:48 PM

## 2020-12-20 NOTE — Discharge Instructions (Addendum)
Inpatient Rehab Discharge Instructions  Nathaniel Lynn Discharge date and time: 12/21/20   Activities/Precautions/ Functional Status: Activity: no lifting, driving, or strenuous exercise for TILL CLEARED BY MD Diet: regular diet Wound Care: keep wound clean and dry   Functional status:  ___ No restrictions     ___ Walk up steps independently _X_ 24/7 supervision/assistance   ___ Walk up steps with assistance ___ Intermittent supervision/assistance  ___ Bathe/dress independently ___ Walk with walker     ___ Bathe/dress with assistance ___ Walk Independently    ___ Shower independently ___ Walk with assistance    _X__ Shower with assistance _X__ No alcohol     ___ Return to work/school ________   Special Instructions:  COMMUNITY REFERRALS UPON DISCHARGE:    Outpatient: PT     OT    ST                 Agency:[facility]   Phone:[phone]              Appointment Date/Time:*Please expect follow-up within 7-10 business days to schedule your appointment. If you have not received follow-up, be sure to contact the site directly. AT THE TIME OF  YOUR APPOINTMENT, BE SURE TO DISCUSS FINANCIAL ASSISTANCE.*  Medical Equipment/Items Ordered:large axillary crutches- billed to room                                                 Agency/Supplier:N/A  GENERAL COMMUNITY RESOURCES FOR PATIENT/FAMILY: To establish a primary care physician, please refer to packet of medical resources for providers in the area:  1) [facility] # [phone] 2) [facility] [place] location [phone] *Reminder that you will need to either call to schedule your appointment, or will need to be a walk-in.   My questions have been answered and I understand these instructions. I will adhere to these goals and the provided educational materials after my discharge from the hospital.  Patient/Caregiver Signature _______________________________ Date  __________  Clinician Signature _______________________________________ Date __________  Please bring this form and your medication list with you to all your follow-up doctor's appointments.

## 2020-12-21 ENCOUNTER — Other Ambulatory Visit (HOSPITAL_COMMUNITY): Payer: Self-pay

## 2020-12-21 DIAGNOSIS — R208 Other disturbances of skin sensation: Secondary | ICD-10-CM

## 2020-12-21 DIAGNOSIS — D62 Acute posthemorrhagic anemia: Secondary | ICD-10-CM

## 2020-12-21 DIAGNOSIS — S7400XA Injury of sciatic nerve at hip and thigh level, unspecified leg, initial encounter: Secondary | ICD-10-CM

## 2020-12-21 DIAGNOSIS — K5900 Constipation, unspecified: Secondary | ICD-10-CM

## 2020-12-21 MED ORDER — QUETIAPINE FUMARATE 50 MG PO TABS
ORAL_TABLET | ORAL | 0 refills | Status: AC
  Filled 2020-12-21: qty 90, 30d supply, fill #0

## 2020-12-21 NOTE — Progress Notes (Signed)
PROGRESS NOTE   Subjective/Complaints: Pt ready for d/c- this AM- no issues   ROS:   Pt denies SOB, abd pain, CP, N/V/C/D, and vision changes      No results found. No results for input(s): WBC, HGB, HCT, PLT in the last 72 hours.  No results for input(s): NA, K, CL, CO2, GLUCOSE, BUN, CREATININE, CALCIUM in the last 72 hours.   Intake/Output Summary (Last 24 hours) at 12/21/2020 0846 Last data filed at 12/21/2020 0130 Gross per 24 hour  Intake 480 ml  Output 1 ml  Net 479 ml        Physical Exam: Vital Signs Blood pressure 110/73, pulse 89, temperature 97.7 F (36.5 C), temperature source Oral, resp. rate 20, height 5\' 10"  (1.778 m), weight 75.3 kg, SpO2 99 %.   General: awake, alert, appropriate,sleeping, but woke easily;  NAD HENT: conjugate gaze; oropharynx moist CV: regular rate; no JVD Pulmonary: CTA B/L; no W/R/R- good air movement GI: soft, NT, ND, (+)BS Psychiatric: appropriate Neurological: sleepy, but with it.   Skin: No evidence of breakdown, no evidence of rash Neurologic: Cranial nerves II through XII intact, motor strength is 5/5 in bilateral deltoid, bicep, tricep, grip, hip flexor, knee extensors, ankle dorsiflexor and plantar flexor Sensory exam normal sensation to light touch and proprioception in bilateral upper and lower extremities Cerebellar exam normal finger to nose to finger as well as heel to shin in bilateral upper and lower extremities Musculoskeletal: Full range of motion in all 4 extremities. No joint swelling  Musculoskeletal:        General: right foot/lower leg less sensitive to touch/ROM today    Cervical back: Normal range of motion. Skin:    General: Skin is warm.    Comments: Multiple healed scars  RLE.   Neurological:    Comments: improving awareness. Still somewhat distracted. No focal CN findings. UE motor 5/5. LLE 4-5/5. RLE limited distally d/t pain. Pt with  sensory loss in rgiht leg and foot. Has weakness in sciatic muscles, esp peroneal div. Pt has 5/5 HAD and HAB. 0/5 ADF and 2/5 APF. DTR's 1+ to absent  Patient has hypersensitivity on the top and bottom of the foot.   Assessment/Plan: 1. Functional deficits which require 3+ hours per day of interdisciplinary therapy in a comprehensive inpatient rehab setting. Physiatrist is providing close team supervision and 24 hour management of active medical problems listed below. Physiatrist and rehab team continue to assess barriers to discharge/monitor patient progress toward functional and medical goals  Care Tool:  Bathing    Body parts bathed by patient: Right arm, Left arm, Chest, Abdomen, Front perineal area, Buttocks, Right upper leg, Left upper leg, Face, Left lower leg, Right lower leg   Body parts bathed by helper: Right lower leg     Bathing assist Assist Level: Supervision/Verbal cueing     Upper Body Dressing/Undressing Upper body dressing   What is the patient wearing?: Pull over shirt    Upper body assist Assist Level: Independent    Lower Body Dressing/Undressing Lower body dressing      What is the patient wearing?: Underwear/pull up, Pants     Lower body assist  Assist for lower body dressing: Supervision/Verbal cueing     Toileting Toileting    Toileting assist Assist for toileting: Supervision/Verbal cueing     Transfers Chair/bed transfer  Transfers assist     Chair/bed transfer assist level: Independent     Locomotion Ambulation   Ambulation assist   Ambulation activity did not occur: Safety/medical concerns (pt scheduled for CT scan R foot r/o pathology)  Assist level: Supervision/Verbal cueing Assistive device: Crutches Max distance: 150'   Walk 10 feet activity   Assist     Assist level: Supervision/Verbal cueing Assistive device: Crutches   Walk 50 feet activity   Assist    Assist level: Supervision/Verbal cueing Assistive  device: Crutches    Walk 150 feet activity   Assist    Assist level: Supervision/Verbal cueing Assistive device: Crutches    Walk 10 feet on uneven surface  activity   Assist Walk 10 feet on uneven surfaces activity did not occur: Safety/medical concerns   Assist level: Supervision/Verbal cueing Assistive device: Crutches   Wheelchair     Assist Will patient use wheelchair at discharge?: No Type of Wheelchair: Manual    Wheelchair assist level: Contact Guard/Touching assist Max wheelchair distance: 150    Wheelchair 50 feet with 2 turns activity    Assist        Assist Level: Moderate Assistance - Patient 50 - 74%   Wheelchair 150 feet activity     Assist      Assist Level: Moderate Assistance - Patient 50 - 74%   Blood pressure 110/73, pulse 89, temperature 97.7 F (36.5 C), temperature source Oral, resp. rate 20, height 5\' 10"  (1.778 m), weight 75.3 kg, SpO2 99 %.  Medical Problem List and Plan: 1.   Functional deficits secondary to anoxic brain injury and rhabdomyolysis. 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             -patient may shower             -ELOS/Goals: 28-30 days, mod assist goals with PT, OT, SLP   -cannot tolerate AFO per therapy due to RLE pain- con't PT, OT and SLP  D/c today- with therapies after d/c if possible? 2.  Impaired mobility: -DVT/anticoagulation:  Pharmaceutical: d/c Lovenox             -antiplatelet therapy:  N/a 3. Right lower extremity dysesthetic pain:              -pt denied any back pain today and there was none one exam either. Exam most consistent with sciatic nerve injury in the proximal right thigh which is entirely possible given his rhabdomyolysis/down-time. I don't think it's related to his ABI either. Continue Oxycodone BID with tylenol prn.              -improved on lyrica 100mg  TID  6/23- pain stable, but still painful- con't regimen  6/24- send home on Lyrica   -have  reviewed massage, desensitization also 4. Mood/sleep:  team providing ego support -continue trazodone 100mg  qhs for sleep              -antipsychotic agents: N/A 5. Neuropsych: This patient is not fully capable of making decisions on his own behalf. 6. Skin/Wound Care: Pressure relief measures. Will add juven for supplement (tastes better than prosource)             --continue vitamin C and Zinc. 7. Fluids/Electrolytes/Nutrition: Monitor I/O. Stable 6/13, monitor weekly 8. Polysubstance  abuse/Hypoxic encephalopathy: Has completed clonidine taper to help with withdrawal             --Oxycodone has been weaned down to 5 mg bid as off on 05/30.               --Continue Seroquel 100 mg hs/50 mg daily. Monitor sleep wake cycle.             --a little impulsive but otherwise quite pleasant demeanor.  9. Aspiration PNA: Completed IV Vanc on 05/20. Respiratory status stable 10. Acute blood loss anemia: Will continue to monitor for signs of bleeding. --is recovering, stable, repeat Hgb weekly 11. Acute renal failure: Due to rhabdomyolysis has resolved with improvement in K-159  6/21- Last checked 6/13- but had resolved.  12. Blood pressure: Intermittent elevation noted.        Vitals:   12/20/20 1940 12/21/20 0532  BP: 140/84 110/73  Pulse: 100 89  Resp: 20 20  Temp: 98.1 F (36.7 C) 97.7 F (36.5 C)  SpO2: 100% 99%  Mild diastolic elevation  6/23- BP controlled- HR slightly variable/up- con't regimen for now.  13. Hypokalemia: resolved with supplementation  14. Thrombocytosis: Slowly trending down 15. Superficial thrombosis LUE: Local measures with elevation for edema management/ heat prn.   17. LUQ pain-   Resolved  18. Dispo  D/c today- will go over d/c meds when family gets here.      LOS: 14 days A FACE TO FACE EVALUATION WAS PERFORMED  Keniesha Adderly 12/21/2020, 8:46 AM

## 2020-12-21 NOTE — Progress Notes (Signed)
Inpatient Rehabilitation Care Coordinator Discharge Note  The overall goal for the admission was met for:   Discharge location: D/c to home with family who will provide 24/7 care.   Length of Stay: 13 days.   Discharge activity level: Supervision.   Home/community participation: Limited.   Services provided included: MD, RD, PT, OT, SLP, RN, CM, TR, Pharmacy, Neuropsych, and SW  Financial Services: Other: Uninsured  Choices offered to/list presented to:Yes  Follow-up services arranged: Outpatient: [facility] for PT/OT/SLP and DME: crutches (billed to room); family has access to [facility]in[facility]  Comments (or additional information): Pt set up for [facility] medication assistance program  Patient/Family verbalized understanding of follow-up arrangements: Yes  Individual responsible for coordination of the follow-up plan: contact pt mother [name] [phone])  Confirmed correct DME delivered: Rana Snare 12/21/2020    Rana Snare

## 2020-12-21 NOTE — Discharge Summary (Signed)
Physician Discharge Summary  Patient ID: Nathaniel Lynn MRN: [id number] DOB/AGE: [birth year] 28 y.o.  Admit date: 12/07/2020 Discharge date: 12/21/2020  Discharge Diagnoses:  Principal Problem:   Anoxic brain injury [facility]) Active Problems:   Sciatic nerve injury   Constipation   Acute blood loss anemia   Dysesthesia--RLE   Discharged Condition: good  Significant Diagnostic Studies: DG Abd 1 View  Result Date: 12/08/2020 CLINICAL DATA:  Left upper quadrant abdominal pain. EXAM: ABDOMEN - 1 VIEW COMPARISON:  None. FINDINGS: The abdominal bowel gas pattern is unremarkable. No findings for small bowel obstruction or free air. The soft tissue shadows are maintained. No worrisome calcifications. The bony structures are intact. IMPRESSION: Unremarkable abdominal radiograph. Electronically Signed   By: Marijo Sanes M.D.   On: 12/08/2020 12:46   CT FOOT RIGHT WO CONTRAST  Result Date: 12/08/2020 CLINICAL DATA:  Diffuse right foot pain for the past month. Currently admitted for drug overdose. EXAM: CT OF THE RIGHT FOOT WITHOUT CONTRAST TECHNIQUE: Multidetector CT imaging of the right foot was performed according to the standard protocol. Multiplanar CT image reconstructions were also generated. COMPARISON:  Right foot x-rays dated November 30, 2020. FINDINGS: Bones/Joint/Cartilage No fracture or dislocation. Joint spaces are preserved. No joint effusion. Ligaments Ligaments are suboptimally evaluated by CT. Muscles and Tendons Grossly intact. Soft tissue No fluid collection or hematoma.  No soft tissue mass. IMPRESSION: 1. No acute osseous abnormality. Electronically Signed   By: Titus Dubin M.D.   On: 12/08/2020 14:24   Labs:  Basic Metabolic Panel: BMP Latest Ref Rng & Units 12/10/2020 12/08/2020 12/07/2020  Glucose 70 - 99 mg/dL 111(H) 120(H) 106(H)  BUN 6 - 20 mg/dL '7 7 10  ' Creatinine 0.61 - 1.24 mg/dL 0.86 0.91 0.87  Sodium 135 - 145 mmol/L 135 135 135  Potassium 3.5 - 5.1 mmol/L 3.5 3.5  3.2(L)  Chloride 98 - 111 mmol/L 98 97(L) 97(L)  CO2 22 - 32 mmol/L '28 28 27  ' Calcium 8.9 - 10.3 mg/dL 9.2 9.4 9.4     CBC: CBC Latest Ref Rng & Units 12/10/2020 12/08/2020 12/07/2020  WBC 4.0 - 10.5 K/uL 6.4 6.3 6.6  Hemoglobin 13.0 - 17.0 g/dL 11.6(L) 12.0(L) 11.8(L)  Hematocrit 39.0 - 52.0 % 33.4(L) 34.3(L) 34.0(L)  Platelets 150 - 400 K/uL 370 419(H) 438(H)     CBG: No results for input(s): GLUCAP in the last 168 hours.  Brief HPI:   Nathaniel Lynn is a 30 y.o. male with history of polysubstance abuse who was admitted to [facility] on 11/10/2020 after being found unresponsive and reports that he had been getting high with his girlfriend.  UDS positive for amphetamines and opiates.  He was found to have acute kidney injury with rhabdomyolysis, tachycardia, dilated pupils and posturing with seizure type activity.  CK elevated >50,000, and noted to have elevated troponins due to demand ischemia.  He was started on IV Keppra as well as IV fluids, intubated in ED and transferred to [facility] for LT-EEG.  MRI brain done revealing restricted diffusion bilateral perirolandic area question due to hypoxic ischemic injury, uremia toxic metabolic encephalopathy.  EEG was negative for seizures and AEDs not needed per neurology.    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  Acute renal failure had resolved  and is being supplemented intermittently.  He tolerated extubation without difficulty and mentation was improving.  Therapy was initiated and he continued to have limitations due to RLE pain, higher level cognitive deficits, balance deficits and poor safety awareness.  CIR was recommended due to functional decline.Marland Kitchen   Hospital Course: Nathaniel Lynn  was admitted to rehab 12/07/2020 for inpatient therapies to consist of PT, ST and OT at least three hours five days a week. Past admission physiatrist, therapy team and rehab RN have worked together to provide customized collaborative inpatient rehab.  His respiratory status has been stable.  Follow-up CBC shows H&H is stable and thrombocytosis has resolved.  Check of be met showed helpful kalemia which has resolved with transient supplementation and improvement in intake.  Mood and anxiety have improved with resolution of sleep-wake disruption. Blood pressures were monitored on TID basis and has been reasonably controlled.  He reported significant RLE pain with dysesthesias and exam consistent with sciatic nerve injury felt to be possibly due to rhabdomyolysis and downtime.  Right foot ordered for work-up was negative for acute changes or injuries. He continues to be hesitant to bear weight through right foot due to pain.  Prevalon boot was ordered to help with foot drop and AFO also ordered however he has tolerated it's use due to pain/discomfort.  Pain has been controlled with scheduled oxycodone 5 mg twice daily and he was started on taper at discharge.  Patient and family advised regarding taper after discharge.  He has made good gains during his rehab stay and supervision is recommended for safety.  He will continue to receive outpatient PT, ST and OT at [facility] after discharge  Rehab course: During patient's stay in rehab weekly team conferences were held to monitor patient's progress, set goals and discuss barriers to discharge. At admission, patient required min to mod assist for ADL tasks min assist with mobility.  He exhibited moderate cognitive impairments with SLUMS score 12/30.  He has had improvement in activity tolerance, balance, postural control as well as ability to compensate for deficits. He had improvement in functional use RLE as well as improvement in awareness and safety.  He requires set up  assist to supervision for all BADLs.  He is independent for transfers and requires supervision with verbal cues for ambulating 150 feet with crutches.   Discharge disposition: 01-Home or Self Care  Diet: Regular  Special Instructions: 1.  No driving, smoking, alcohol or strenuous activity till cleared by MD. 2.  Family to provide 24-hour supervision Discharge Instructions     Ambulatory referral to Occupational Therapy   Complete by: As directed    Eval and treat   Ambulatory referral to Physical Medicine Rehab   Complete by: As directed    1-2 weeks TC appt   Ambulatory referral to Speech Therapy   Complete by: As directed    Eval and treat      Allergies as of 12/21/2020       Reactions   Penicillins Other (See Comments)   unknown   Penicillins    Pt states he is allergic to penicillin. He said he was told that but he does not know what the reaction is.        Medication List     STOP taking these medications    BC HEADACHE POWDER PO   docusate sodium 100 MG capsule Commonly known as: COLACE   ipratropium-albuterol 0.5-2.5 (3) MG/3ML Soln Commonly known as: DUONEB   meloxicam  15 MG tablet Commonly known as: MOBIC   polyethylene glycol 17 g packet Commonly known as: MIRALAX / GLYCOLAX Replaced by: polyethylene glycol powder 17 GM/SCOOP powder       TAKE these medications    (feeding supplement) PROSource Plus liquid Take 30 mLs by mouth 2 (two) times daily between meals.   acetaminophen 325 MG tablet Commonly known as: TYLENOL Take 1-2 tablets (325-650 mg total) by mouth every 4 (four) hours as needed for mild pain. What changed:  how much to take when to take this reasons to take this Notes to patient: USE 2 TYLENOL FOUR TIMES A DAY FOR PAIN AS NEEDED AS NARCOTICS ARE WEANED OFF.    CertaVite/Antioxidants Tabs Take 1 tablet by mouth daily.   folic acid 1 MG tablet Commonly known as: FOLVITE Take 1 tablet (1 mg total) by mouth daily.    methocarbamol 500 MG tablet Commonly known as: ROBAXIN Take 1 tablet (500 mg total) by mouth every 6 (six) hours as needed for muscle spasms.   mouth rinse Liqd solution 15 mLs by Mouth Rinse route 2 (two) times daily.   mupirocin cream 2 % Commonly known as: BACTROBAN Apply topically daily.   oxyCODONE 5 MG immediate release tablet--Rx #10 pills  Commonly known as: Oxy IR/ROXICODONE Take 1 tablet (5 mg total) by mouth 2 (two) times daily. Notes to patient: WEAN DOWN TO ONCE A DAY FOR 5 DAYS STARTING TOMORROW. THEN CHANGE TO USE AS S NEEDED. TAKE TYLENOL OR IBUPROFEN AS NEEDED   polyethylene glycol powder 17 GM/SCOOP powder Commonly known as: GLYCOLAX/MIRALAX dissolve and Take 1 capful (17 g) by mouth daily. Replaces: polyethylene glycol 17 g packet   polyvinyl alcohol 1.4 % ophthalmic solution Commonly known as: LIQUIFILM TEARS Place 1 drop into both eyes 3 (three) times daily.   pregabalin 100 MG capsule Commonly known as: LYRICA Take 1 capsule (100 mg total) by mouth 3 (three) times daily.   QUEtiapine 50 MG tablet Commonly known as: SEROQUEL Take 1 tablet (50 mg total) by mouth in the morning AND 2 tablets (100 mg total) at bedtime. What changed:  See the new instructions. Another medication with the same name was removed. Continue taking this medication, and follow the directions you see here.   thiamine 100 MG tablet Take 1 tablet (100 mg total) by mouth daily.   traZODone 100 MG tablet Commonly known as: DESYREL Take 1 tablet (100 mg total) by mouth at bedtime.   vitamin C 500 MG tablet Commonly known as: ASCORBIC ACID Take 1 tablet (500 mg total) by mouth daily.   Zinc Sulfate 220 (50 Zn) MG Tabs Take 1 tablet (220 mg total) by mouth daily.        Follow-up Information     Meredith Staggers, MD Follow up.   Specialty: Physical Medicine and Rehabilitation Why: office will call you with follow up appointment Contact information: [address] [place] [address] [phone]                 Signed: Bary Leriche 12/21/2020, 6:42 PM

## 2020-12-21 NOTE — Progress Notes (Signed)
INPATIENT REHABILITATION DISCHARGE NOTE   Discharge instructions by: Marissa Nestle PA  Verbalized understanding: yes  Skin care/Wound care: skin intact no wounds  Pain: none at discharge  IV's: none  Tubes/Drains: none  Safety instructions: given education by PA  Patient belongings: with patient, and family members present at discharge  Discharged to: home with mother and sister  Discharged via: car   [place]

## 2020-12-25 ENCOUNTER — Telehealth: Payer: Self-pay

## 2020-12-25 ENCOUNTER — Other Ambulatory Visit (HOSPITAL_COMMUNITY): Payer: Self-pay

## 2020-12-25 NOTE — Telephone Encounter (Signed)
Transition Care Management Unsuccessful Follow-up Telephone Call  Date of discharge and from where:  12/21/2020 [facility]  Attempts:  1st Attempt  Reason for unsuccessful TCM follow-up call:  Left voice message with Carol-mother of patient

## 2020-12-25 NOTE — Telephone Encounter (Signed)
Transitional Care call--mother Okey Regal    Are you/is patient experiencing any problems since coming home? No Are there any questions regarding any aspect of care? No Are there any questions regarding medications administration/dosing? No Are meds being taken as prescribed? Yes Patient should review meds with caller to confirm Have there been any falls? Slip but caught self Has Home Health been to the house and/or have they contacted you? Outpt scheduled If not, have you tried to contact them? Can we help you contact them? Are bowels and bladder emptying properly? Yes Are there any unexpected incontinence issues? No If applicable, is patient following bowel/bladder programs? Any fevers, problems with breathing, unexpected pain? No Are there any skin problems or new areas of breakdown? No Has the patient/family member arranged specialty MD follow up (ie cardiology/neurology/renal/surgical/etc)? Yes Can we help arrange? Does the patient need any other services or support that we can help arrange? No Are caregivers following through as expected in assisting the patient? Yes Has the patient quit smoking, drinking alcohol, or using drugs as recommended? Yes  Appointment time 10:00 am, arrive time 9:40 am with Riley Lam then Dr. Riley Kill [address] [phone]

## 2020-12-26 ENCOUNTER — Emergency Department
Admission: EM | Admit: 2020-12-26 | Discharge: 2020-12-26 | Disposition: A | Discharge status: Home / Self Care | Payer: Self-pay | Attending: Emergency Medicine | Admitting: Emergency Medicine

## 2020-12-26 ENCOUNTER — Other Ambulatory Visit: Payer: Self-pay

## 2020-12-26 ENCOUNTER — Emergency Department: Payer: Self-pay

## 2020-12-26 DIAGNOSIS — S99921A Unspecified injury of right foot, initial encounter: Secondary | ICD-10-CM | POA: Insufficient documentation

## 2020-12-26 DIAGNOSIS — T1490XA Injury, unspecified, initial encounter: Secondary | ICD-10-CM

## 2020-12-26 DIAGNOSIS — W010XXA Fall on same level from slipping, tripping and stumbling without subsequent striking against object, initial encounter: Secondary | ICD-10-CM | POA: Insufficient documentation

## 2020-12-26 DIAGNOSIS — M79671 Pain in right foot: Secondary | ICD-10-CM

## 2020-12-26 DIAGNOSIS — Y92511 Restaurant or cafe as the place of occurrence of the external cause: Secondary | ICD-10-CM | POA: Insufficient documentation

## 2020-12-26 MED ORDER — PREDNISONE 10 MG (21) PO TBPK: ORAL_TABLET | ORAL | 0 refills | Status: DC

## 2020-12-26 NOTE — ED Provider Notes (Signed)
[facility]-EMERGENCY DEPARTMENT  ____________________________________________  Time seen: Approximately 4:06 PM  I have reviewed the triage vital signs and the nursing notes.   HISTORY  Chief Complaint Foot Pain   Historian Patient    HPI Nathaniel Lynn is a 30 y.o. male with a history of anoxic brain injury, sciatic nerve injury, and paresthesias of the right lower extremity presents to the emergency department with right foot pain.  Patient states that he was at a [employer] with his father when he tripped and fell new right foot pain.  Patient states that he feels as though he is unable to bear weight since injury occurred.  No loss of sensation or new numbness or tingling.  Patient denies abrasions or lacerations.  He has been wearing a soft boot at home but is requesting a more supportive boot until he can see his "leg doctor".  Patient is not sure whether or not he has been seen by a neurologist in the past.   Past Medical History:  Diagnosis Date   IVDU (intravenous drug user)      Immunizations up to date:  Yes.     Past Medical History:  Diagnosis Date   IVDU (intravenous drug user)     Patient Active Problem List   Diagnosis Date Noted   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   Constipation 12/21/2020   Acute blood loss anemia 12/21/2020   Dysesthesia--RLE 12/21/2020   Anoxic brain injury (HCC) 12/07/2020   Pressure injury of skin 11/21/2020   AKI (acute kidney injury) (HCC)    Aspiration pneumonia (HCC)    Hypernatremia    Acute respiratory failure with hypoxemia (HCC)    Encephalopathy    Status epilepticus (HCC) 11/10/2020    No past surgical history on file.  Prior to Admission medications   Medication Sig Start Date End Date Taking? Authorizing Provider  predniSONE (STERAPRED UNI-PAK 21 TAB) 10 MG (21) TBPK tablet Take 6 tablets the first day, take 5 tablets the second day, take 4 tablets the third day, take 3 tablets the fourth day, take 2 tablets  the fifth day, take 1 tablet the sixth day. 12/26/20  Yes Pia Mau M, PA-C  acetaminophen (TYLENOL) 325 MG tablet Take 1-2 tablets (325-650 mg total) by mouth every 4 (four) hours as needed for mild pain. 12/20/20   Love, Evlyn Kanner, PA-C  ascorbic acid (VITAMIN C) 500 MG tablet Take 1 tablet (500 mg total) by mouth daily. 12/21/20   Love, Evlyn Kanner, PA-C  folic acid (FOLVITE) 1 MG tablet Take 1 tablet (1 mg total) by mouth daily. 12/20/20   Love, Evlyn Kanner, PA-C  methocarbamol (ROBAXIN) 500 MG tablet Take 1 tablet (500 mg total) by mouth every 6 (six) hours as needed for muscle spasms. 12/20/20   Love, Evlyn Kanner, PA-C  Mouthwashes (MOUTH RINSE) LIQD solution 15 mLs by Mouth Rinse route 2 (two) times daily. 12/07/20   Pokhrel, Rebekah Chesterfield, MD  Multiple Vitamins-Minerals (CERTAVITE/ANTIOXIDANTS) TABS Take 1 tablet by mouth daily. 12/20/20   Love, Evlyn Kanner, PA-C  mupirocin cream (BACTROBAN) 2 % Apply topically daily. 12/07/20   Pokhrel, Rebekah Chesterfield, MD  Nutritional Supplements (,FEEDING SUPPLEMENT, PROSOURCE PLUS) liquid Take 30 mLs by mouth 2 (two) times daily between meals. 12/07/20   Pokhrel, Rebekah Chesterfield, MD  oxyCODONE (OXY IR/ROXICODONE) 5 MG immediate release tablet Take 1 tablet (5 mg total) by mouth 2 (two) times daily. 12/20/20   Love, Evlyn Kanner, PA-C  polyethylene glycol powder (GLYCOLAX/MIRALAX) 17 GM/SCOOP powder dissolve and  Take 1 capful (17 g) by mouth daily. 12/20/20   Love, Evlyn Kanner, PA-C  polyvinyl alcohol (LIQUIFILM TEARS) 1.4 % ophthalmic solution Place 1 drop into both eyes 3 (three) times daily. 12/20/20   Love, Evlyn Kanner, PA-C  pregabalin (LYRICA) 100 MG capsule Take 1 capsule (100 mg total) by mouth 3 (three) times daily. 12/20/20   Love, Evlyn Kanner, PA-C  QUEtiapine (SEROQUEL) 50 MG tablet Take 1 tablet (50 mg total) by mouth in the morning AND 2 tablets (100 mg total) at bedtime. 12/21/20   Love, Evlyn Kanner, PA-C  thiamine 100 MG tablet Take 1 tablet (100 mg total) by mouth daily. 12/20/20   Love, Evlyn Kanner, PA-C   traZODone (DESYREL) 100 MG tablet Take 1 tablet (100 mg total) by mouth at bedtime. 12/20/20   Love, Evlyn Kanner, PA-C  Zinc Sulfate 220 (50 Zn) MG TABS Take 1 tablet (220 mg total) by mouth daily. 12/21/20   Love, Evlyn Kanner, PA-C    Allergies Penicillins and Penicillins  No family history on file.  Social History Social History   Tobacco Use   Smoking status: Unknown   Smokeless tobacco: Never  Substance Use Topics   Alcohol use: Yes   Drug use: Not Currently     Review of Systems  Constitutional: No fever/chills Eyes:  No discharge ENT: No upper respiratory complaints. Respiratory: no cough. No SOB/ use of accessory muscles to breath Gastrointestinal:   No nausea, no vomiting.  No diarrhea.  No constipation. Musculoskeletal: Patient has right foot pain.  Skin: Negative for rash, abrasions, lacerations, ecchymosis.    ____________________________________________   PHYSICAL EXAM:  VITAL SIGNS: ED Triage Vitals  Enc Vitals Group     BP 12/26/20 1358 (!) 121/95     Pulse Rate 12/26/20 1358 (!) 106     Resp 12/26/20 1358 17     Temp 12/26/20 1358 98.2 F (36.8 C)     Temp Source 12/26/20 1358 Oral     SpO2 12/26/20 1358 97 %     Weight 12/26/20 1404 180 lb (81.6 kg)     Height 12/26/20 1404 6' (1.829 m)     Head Circumference --      Peak Flow --      Pain Score 12/26/20 1403 7     Pain Loc --      Pain Edu? --      Excl. in GC? --      Constitutional: Alert and oriented. Well appearing and in no acute distress. Eyes: Conjunctivae are normal. PERRL. EOMI. Head: Atraumatic. ENT:      Nose: No congestion/rhinnorhea.      Mouth/Throat: Mucous membranes are moist.  Neck: No stridor.  No cervical spine tenderness to palpation. Cardiovascular: Normal rate, regular rhythm. Normal S1 and S2.  Good peripheral circulation. Respiratory: Normal respiratory effort without tachypnea or retractions. Lungs CTAB. Good air entry to the bases with no decreased or absent breath  sounds Gastrointestinal: Bowel sounds x 4 quadrants. Soft and nontender to palpation. No guarding or rigidity. No distention. Musculoskeletal: Patient demonstrates full range of motion of the right ankle.  Palpable dorsalis pedis pulse, right.  Capillary refill less than 2 seconds on the right. Neurologic:  Normal for age. No gross focal neurologic deficits are appreciated.  Skin:  Skin is warm, dry and intact. No rash noted. Psychiatric: Mood and affect are normal for age. Speech and behavior are normal.   ____________________________________________   LABS (all labs ordered are listed, but  only abnormal results are displayed)  Labs Reviewed - No data to display ____________________________________________  EKG   ____________________________________________  RADIOLOGY Geraldo Pitter, personally viewed and evaluated these images (plain radiographs) as part of my medical decision making, as well as reviewing the written report by the radiologist.  DG Foot Complete Right  Result Date: 12/26/2020 CLINICAL DATA:  Pain following fall EXAM: RIGHT FOOT COMPLETE - 3+ VIEW COMPARISON:  Right foot radiographs November 30, 2020; right foot CT December 08, 2020 FINDINGS: Frontal, oblique, and lateral views were obtained. No fracture or dislocation. Joint spaces appear normal. No erosive change. IMPRESSION: No fracture or dislocation.  No evident arthropathy. Electronically Signed   By: Bretta Bang III M.D.   On: 12/26/2020 14:53    ____________________________________________    PROCEDURES  Procedure(s) performed:     Procedures     Medications - No data to display   ____________________________________________   INITIAL IMPRESSION / ASSESSMENT AND PLAN / ED COURSE  Pertinent labs & imaging results that were available during my care of the patient were reviewed by me and considered in my medical decision making (see chart for details).      Assessment and plan Right foot  pain 30 year old male presents to the emergency department with acute right foot pain after a possible inversion type ankle injury at a local restaurant.  Patient is not complaining of pain in the right ankle but mostly has pain along the dorsal aspect of the right foot near the second and third distal metatarsals.  Patient had no evidence of fracture on x-ray.  I did place patient in a cam boot and started patient on a tapered steroid which might be helpful for patient's right lower extremity paresthesias that have been baseline since patient's anoxic brain injury.     ____________________________________________  FINAL CLINICAL IMPRESSION(S) / ED DIAGNOSES  Final diagnoses:  Injury  Right foot pain      NEW MEDICATIONS STARTED DURING THIS VISIT:  ED Discharge Orders          Ordered    predniSONE (STERAPRED UNI-PAK 21 TAB) 10 MG (21) TBPK tablet        12/26/20 1532                This chart was dictated using voice recognition software/Dragon. Despite best efforts to proofread, errors can occur which can change the meaning. Any change was purely unintentional.     Orvil Feil, PA-C 12/26/20 1612    Concha Se, MD 12/26/20 [phone]

## 2020-12-26 NOTE — ED Triage Notes (Signed)
Pt to ED for right foot pain for the past few days. States has been in hospital for the past few weeks for nerve damage to right leg, almost fell and caught himself with right foot, has been having pain since.  Reports constant pain. No significant swelling or redness noted.  States has nerve damage to right foot, takes lyrica

## 2020-12-26 NOTE — Discharge Instructions (Addendum)
Take tapered steroid as directed.  

## 2020-12-27 ENCOUNTER — Other Ambulatory Visit: Payer: Self-pay

## 2020-12-27 ENCOUNTER — Ambulatory Visit: Payer: Self-pay | Attending: Physical Medicine and Rehabilitation | Admitting: Speech Pathology

## 2020-12-27 ENCOUNTER — Encounter: Payer: Self-pay | Admitting: Speech Pathology

## 2020-12-27 ENCOUNTER — Ambulatory Visit: Payer: Self-pay

## 2020-12-27 DIAGNOSIS — G931 Anoxic brain damage, not elsewhere classified: Secondary | ICD-10-CM | POA: Insufficient documentation

## 2020-12-27 DIAGNOSIS — R41841 Cognitive communication deficit: Secondary | ICD-10-CM | POA: Insufficient documentation

## 2020-12-27 NOTE — Therapy (Signed)
[facility] MAIN [facility] SERVICES [address] [place], [place], [address] Phone: [phone]   Fax:  [phone]  Speech Language Pathology Evaluation  Patient Details  Name: Nathaniel Lynn MRN: [id number] Date of Birth: Dec 30, 1990 Referring Provider (SLP): Delle Reining   Encounter Date: 12/27/2020   End of Session - 12/27/20 1247     Visit Number 1    Number of Visits 25    Date for SLP Re-Evaluation 03/21/21    Authorization - Visit Number 1    Progress Note Due on Visit 10    SLP Start Time 1005    SLP Stop Time  1100    SLP Time Calculation (min) 55 min    Activity Tolerance Patient tolerated treatment well             Past Medical History:  Diagnosis Date   IVDU (intravenous drug user)     History reviewed. No pertinent surgical history.  There were no vitals filed for this visit.   Subjective Assessment - 12/27/20 1235     Subjective pt pleasant, accompanied by his mother - pt stated "my thinking ability has returned to normal, I am good"    Patient is accompained by: Family member    Currently in Pain? No/denies                SLP Evaluation [facility] - 12/27/20 1235       SLP Visit Information   SLP Received On 12/27/20    Referring Provider (SLP) Delle Reining    Onset Date 11/10/2020    Medical Diagnosis Anoxic Brain Injury      Subjective   Patient/Family Stated Goal to live independently again      General Information   HPI Patient is a 30 y.o. male with a history of narcotic abuse recently release from 2 year prison sentence who was found down reportedly "getting high" at girlfriend's house on 5/14, taken to [facility] for evaluation and intubation with eventual transfer to [facility]. Labs notable for +UDS (amphetamines, opiates, cannabinoids), Cr 2.33 (baseline 0.8-1.2), CK level > 50K, LA 3.0. CT Head was negative for acute pathology. MRI brain suggested global hypoxic ischemic injury. Patient extubated 5/25. Patient  admitted to CIR 12/07/20 with discharge home with mother on 12/22/2020.    Behavioral/Cognition appropriate, no insight into deficits    Mobility Status arrived in [employer]      Balance Screen   Has the patient fallen in the past 6 months Yes    How many times? 3 times since home from rehab when trying to manage with crutches    Has the patient had a decrease in activity level because of a fear of falling?  Yes      Prior Functional Status   Cognitive/Linguistic Baseline Information not available   hx of polysubstance abuse with incarceration   Type of Home Mobile home     Lives With Family    Available Support Family    Education 8th grade   studied for GED with unofficial completion     Pain Assessment   Pain Assessment No/denies pain      Cognition   Overall Cognitive Status Impaired/Different from baseline    Area of Impairment Attention;Memory;Following commands;Safety/judgement;Awareness;Problem solving;Rancho level    Current Attention Level Selective    Memory Decreased recall of precautions;Decreased short-term memory    Following Commands Follows one step commands consistently    Safety/Judgement Decreased awareness of safety;Decreased awareness of  deficits    Awareness Intellectual    Problem Solving Requires verbal cues;Difficulty sequencing    Rancho [employer] Scales of Cognitive Functioning Automatic/appropriate    Attention Selective    Selective Attention Impaired    Selective Attention Impairment Verbal complex;Functional complex    Memory Impaired    Memory Impairment Decreased recall of new information;Decreased short term memory    Decreased Short Term Memory Verbal basic;Functional basic    Awareness Impaired    Awareness Impairment Intellectual impairment    Problem Solving Impaired    Problem Solving Impairment Verbal complex;Functional complex    Executive Function Reasoning;Decision Making;Organizing;Self Monitoring;Self Correcting    Reasoning  Impaired    Reasoning Impairment Verbal complex;Functional complex    Organizing Impaired    Organizing Impairment Verbal complex;Functional complex    Decision Making Impaired    Decision Making Impairment Verbal basic;Functional basic    Self Monitoring Impaired    Self Monitoring Impairment Verbal basic;Functional basic    Self Correcting Impaired    Self Correcting Impairment Verbal basic;Functional basic    Behaviors Impulsive      Auditory Comprehension   Overall Auditory Comprehension Appears within functional limits for tasks assessed      Visual Recognition/Discrimination   Discrimination Within Function Limits      Reading Comprehension   Reading Status Not tested      Expression   Primary Mode of Expression Verbal      Verbal Expression   Overall Verbal Expression Appears within functional limits for tasks assessed      Written Expression   Dominant Hand Right    Written Expression Within Functional Limits      Oral Motor/Sensory Function   Overall Oral Motor/Sensory Function Appears within functional limits for tasks assessed      Motor Speech   Overall Motor Speech Appears within functional limits for tasks assessed      Standardized Assessments   Standardized Assessments  Cognitive Linguistic Quick Test                             SLP Education - 12/27/20 1246     Education Details results of evaluation, anoxic brain injury, deficits associated with anoxic brain injury, lack of insight into deficits    Person(s) Educated Patient;Parent(s)    Methods Explanation;Demonstration;Handout;Verbal cues    Comprehension Verbalized understanding;Need further instruction              SLP Short Term Goals - 12/27/20 1249       SLP SHORT TERM GOAL #1   Title With moderate cues, pt will demonstrate selective attention to task in a mildly distracting environment for 15 minutes.    Baseline new goal    Time 10    Period --   sessions    Status New      SLP SHORT TERM GOAL #2   Title With moderate cues, pt will demonstrate intellectual awareness by identifying 3 cognitive and 3 physical deficits related to anoxic brain injury.    Baseline new goal    Time 10    Period --   sessions   Status New      SLP SHORT TERM GOAL #3   Title Pt will use compensatory memory strategies to recall orientation information in 9 out of 10 sessions.    Baseline new goal    Time 10    Period --   sessions   Status  New      SLP SHORT TERM GOAL #4   Title With moderate cues, pt will complete medication management tasks with 75% accuracy.    Baseline new goal    Time 10    Period --   sessions   Status New      SLP SHORT TERM GOAL #5   Title With moderate cues, pt will complete money management tasks with 75% accuracy.    Baseline new goal    Time 10    Period --   sessions   Status New              SLP Long Term Goals - 12/27/20 1259       SLP LONG TERM GOAL #1   Title With supervision, pt will demonstrate anticipatory awareness by providing safe solution to hypothetical situation.    Baseline new goal    Time 12    Period Weeks    Status New    Target Date 03/21/21      SLP LONG TERM GOAL #2   Title With moderate cues, pt will demonstrate alternating attention in  a mildly distracting environment for 10 minutes.    Baseline new goal    Time 12    Period Weeks    Status New    Target Date 03/21/21      SLP LONG TERM GOAL #3   Title With supervision cues, pt will use compensatory memory strategies to recall important dates (such as doctor's appts) with 75% accuracy.    Baseline new goal    Time 12    Period Weeks    Status New    Target Date 03/21/21      SLP LONG TERM GOAL #4   Title With supervision cues, pt will complete money and medication management in 7 out of 10 opportunties.    Baseline new goal    Time 12    Period Weeks    Status New    Target Date 03/21/21              Plan - 12/27/20  1248     Clinical Impression Statement Pt presents with behaviors consistent with Rancho Level VII Automatic and Appropriate. As such, he continues to presentw ith moderate cognitive impairments in attention, executive functions; severe deficits in memory, severe deficits in intellectual awareness and severe deficits in safety. These deficits are further supported by pt's scores on The Cognitive Linguistic Quick Test where he obtained moderate severity ratings in attention, executive functions, and severe deficits in memory. Pt also demonstrates impulsivity which decreases his accuracy when completing a task. Skilled ST is required to target the above mentioned deficits in an effort to increase functional independence and reduce caregiver burden.    Speech Therapy Frequency 2x / week    Duration 12 weeks    Treatment/Interventions Compensatory strategies;Cueing hierarchy;Functional tasks;Patient/family education;Cognitive reorganization;SLP instruction and feedback;Internal/external aids    Potential to Achieve Goals Fair    Potential Considerations Previous level of function;Co-morbidities;Severity of impairments;Other (comment)   history of polysubstance abuse with incarceration   Consulted and Agree with Plan of Care Patient;Family member/caregiver    Family Member Consulted pt's mother             Patient will benefit from skilled therapeutic intervention in order to improve the following deficits and impairments:   Cognitive communication deficit  Anoxic brain injury [facility])    Problem List Patient Active Problem List   Diagnosis Date Noted  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 Dreama Saa M.S., CCC-SLP, [facility] Speech-Language Pathologist Rehabilitation Services Office [phone]  Reuel Derby 12/27/2020, 1:26 PM  [facility] MAIN [facility] SERVICES [address] [place], [place], [address] Phone: [phone]   Fax:  [phone]  Name: DENISE BRAMBLETT MRN: [id number] Date of Birth: [birth year]

## 2020-12-27 NOTE — Therapy (Signed)
[facility] MAIN [facility] SERVICES [address] [place], [place], [address] Phone: [phone]   Fax:  [phone]  Occupational Therapy Evaluation  Patient Details  Name: Nelda SevereLacy R Willmann MRN: [id number] Date of Birth: 06/27/1991 Referring Provider (OT): Jacquelynn CreeLove, Pamela S, [place]   Encounter Date: 12/27/2020   OT End of Session - 12/27/20 1203     Visit Number 1    Number of Visits 1    OT Start Time 1102    OT Stop Time 1136    OT Time Calculation (min) 34 min    Activity Tolerance Patient tolerated treatment well    Behavior During Therapy [facility] for tasks assessed/performed             Past Medical History:  Diagnosis Date   IVDU (intravenous drug user)     No past surgical history on file.  There were no vitals filed for this visit.   Subjective Assessment - 12/27/20 1147     Subjective  "I sprained my ankle yesterday and went to the ER."    Patient is accompanied by: Family member    Currently in Pain? Yes    Pain Score 5     Pain Location Ankle    Pain Orientation Right    Pain Descriptors / Indicators Aching;Sore    Pain Type Acute pain    Pain Radiating Towards R foot/leg    Pain Onset Yesterday    Pain Frequency Constant    Multiple Pain Sites No               [facility] OT Assessment - 12/27/20 1150       Assessment   Medical Diagnosis anoxic brain injury    Referring Provider (OT) Jacquelynn CreeLove, Pamela S, PA    Onset Date/Surgical Date 11/10/20    Hand Dominance Right    Prior Therapy CIR      Precautions   Precautions Fall    Required Braces or Orthoses Other Brace/Splint   walking boot for R foot, but does not have at eval     Balance Screen   Has the patient fallen in the past 6 months Yes    How many times? 3 times since home from rehab when trying to manage with crutches      Home  Environment   Family/patient expects to be discharged to: Private residence    Living Arrangements Parent    Available Help at  Discharge Family    Type of Home House    Home Access Stairs    Home Layout One level    Alternate Level Stairs - Number of Steps 2 steps to enter house    Bathroom Shower/Tub Walk-in Shower    Bathroom Toilet Standard    Home Equipment Albert LeaWalker - 2 wheels;Shower seat;Crutches;Wheelchair - manual      Prior Function   Level of Independence Independent    Vocation Full time employment    Marketing executiveVocation Requirements [employer]      ADL   ADL comments Pt is managing basic self care with modified indep.  Using shower chair for bathing and crutches for all ADL set up.      IADL   Prior Level of Function Meal Prep Indep    Meal Prep Needs to have meals prepared and served   family assists d/t pt on crutches   Medication Management Has difficulty remembering to take medication   mother assists with pill organizer d/t recent memory deficits  Mobility   Mobility Status History of falls    Mobility Status Comments alternating between crutches at home and wc for community mobility      Written Expression   Dominant Hand Right      Vision - History   Baseline Vision No visual deficits      Vision Assessment   Tracking/Visual Pursuits Able to track stimulus in all quads without difficulty    Saccades Within functional limits    Visual Fields No apparent deficits      Cognition   Memory Impaired    Awareness Impaired    Cognition Comments Will be seeing SLP for cognitive deficits      Observation/Other Assessments   Focus on Therapeutic Outcomes (FOTO)  N/A; no additional OT planned      Sensation   Light Touch Appears Intact      Coordination   Gross Motor Movements are Fluid and Coordinated Yes    Fine Motor Movements are Fluid and Coordinated Yes    Right 9 Hole Peg Test 24 sec    Left 9 Hole Peg Test 30 sec      Perception   Perception Within Functional Limits      Praxis   Praxis Intact      AROM   Overall AROM Comments BUEs WNL      Strength   Overall Strength Comments  BUEs 5/5      Hand Function   Right Hand Grip (lbs) 120    Right Hand Lateral Pinch 22 lbs    Right Hand 3 Point Pinch 22 lbs    Left Hand Grip (lbs) 114    Left Hand Lateral Pinch 22 lbs    Left 3 point pinch 22 lbs           Occupational Therapy Evaluation: Pt is a 30 y/o male, being evaluated for outpatient Occupational Therapy this day following recent hospitalization and CIR stay d/t deficits sustained as a result of an anoxic brain injury.  Pt was found at home unresponsive related to drug use, experienced seizure activity, and was ultimately intubated.  Pt was transferred to CIR on 12/07/20 and is now home with family.  Pt lives in 1 level home with both parents and older sister.  Patient also has 3 school aged kids.  Prior to hospitalization, pt was indep with all ADLs/IADLs and mobility and worked FT in [employer]. Mother present today at eval and appears very supportive in pt's care.  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  ED diagnosed R ankle sprain and pt was given a walking boot.  Pt uses crutches at home and wc for community distances.  Pt does have a walker d/t his foot drop, but has preferred using the crutches even prior to the ankle sprain.  Mother is currently assisting with medications d/t pt's memory deficits, and IADLs d/t pt's mobility limitations in using crutches.  OT reviewed strategies to more safely maneuver around the kitchen/home, recommending that he use his walker with either a walker basket or a tray to transport ADL supplies.  Pt reports he has a basket but he adamantly reports he does not plan to use his walker as he doesn't want to feel "old."  OT reviewed fall prevention strategies for ADL safety, including avoiding trying to carry ADL supplies while using crutches, make slow positional  changes, keep pathways clear, and slow pace with mobility.  Pt verbalized  understanding and mother also helps to reinforce these strategies.  Pt is currently managing all basic ADLs with modified indep and mother and other family will continue to assist as needed with IADLs as mobility is still limited.  BUE strength/coordination, vision all grossly intact.  Pt/mother report his main challenges are related to cognition and R ankle sprain, RLE pain and weakness, and R foot drop.  Pt will be seeing SLP to address cognitive limitations and pt has PT evaluation scheduled for August.  Pt denies any need for additional Occupational Therapy at this time.  OT in agreement with plan.       OT Education - 12/27/20 1203     Education Details OT role, fall prevention, ADL safety    Person(s) Educated Patient;Parent(s)    Methods Explanation;Verbal cues    Comprehension Verbalized understanding              OT Short Term Goals - 12/27/20 1204       OT SHORT TERM GOAL #1   Title Pt will be indep to recall fall prevention strategies to maximize safety with ADLs/IADLs.    Baseline Eval: Pt able to recall fall prevention strategies.    Time 1    Period Days    Status Achieved    Target Date 12/27/20              Plan - 12/27/20 1221     Clinical Impression Statement Pt is a 30 y/o male, being evaluated for outpatient Occupational Therapy this day following recent hospitalization and CIR stay d/t deficits sustained as a result of an anoxic brain injury.  Pt was found at home unresponsive related to drug use, experienced seizure activity, and was ultimately intubated.  Pt was transferred to CIR on 12/07/20 and is now home with family.  Pt lives in 1 level home with both parents and older sister.  Patient also has 3 school aged kids.  Prior to hospitalization, pt was indep with all ADLs/IADLs and mobility and worked FT in [employer]. Mother present today at eval and appears very supportive in pt's care.  Pt reports that he was at the ED yesterday after a fall where he injured  his R ankle, the same foot that has had foot drop since the brain injury.  ED diagnosed R ankle sprain and pt was given a walking boot.  Pt uses crutches at home and wc for community distances.  Pt does have a walker d/t his foot drop, but has preferred using the crutches even prior to the ankle sprain.  Mother is currently assisting with medications d/t pt's memory deficits, and IADLs d/t pt's mobility limitations in using crutches.  OT reviewed strategies to more safely maneuver around the kitchen/home, recommending that he use his walker with either a walker basket or a tray to transport ADL supplies.  Pt reports he has a basket but he adamantly reports he does not plan to use his walker as he doesn't want to feel "old."  OT reviewed fall prevention strategies for ADL safety, including avoiding trying to carry ADL supplies while using crutches, make slow positional changes, keep pathways clear, and slow pace with mobility.  Pt verbalized understanding and mother also helps to reinforce these strategies.  Pt is currently managing all basic ADLs with modified indep and mother and other family will continue to assist as needed with IADLs as mobility is still limited.  BUE strength/coordination, vision all grossly intact.  Pt/mother report his main challenges are related to cognition and R ankle sprain, RLE pain and weakness, and R foot drop.  Pt will be seeing SLP to address cognitive limitations and pt has PT evaluation scheduled for August.  Pt denies any need for additional Occupational Therapy at this time.  OT in agreement with plan.    OT Occupational Profile and History Detailed Assessment- Review of Records and additional review of physical, cognitive, psychosocial history related to current functional performance    Occupational performance deficits (Please refer to evaluation for details): IADL's;Work    Body Structure / Function / Physical Skills Balance;IADL;Pain;Strength;Gait;Mobility    Rehab  Potential Good    Clinical Decision Making Several treatment options, min-mod task modification necessary    Comorbidities Affecting Occupational Performance: May have comorbidities impacting occupational performance    Modification or Assistance to Complete Evaluation  Min-Moderate modification of tasks or assist with assess necessary to complete eval    OT Frequency One time visit    OT Treatment/Interventions Self-care/ADL training;Patient/family education    Consulted and Agree with Plan of Care Patient;Family member/caregiver             Patient will benefit from skilled therapeutic intervention in order to improve the following deficits and impairments:   Body Structure / Function / Physical Skills: Balance, IADL, Pain, Strength, Gait, Mobility     Visit Diagnosis: Anoxic brain injury [facility])  Problem List Patient Active Problem List   Diagnosis Date Noted   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 [place], [place], [address] Phone: [phone]   Fax:  [phone]  Name: ABDIMALIK MAYORQUIN MRN: [id number] Date of Birth: [birth year]

## 2021-01-01 ENCOUNTER — Ambulatory Visit: Payer: Medicaid Other | Admitting: Speech Pathology

## 2021-01-03 ENCOUNTER — Encounter: Payer: Self-pay | Attending: Registered Nurse | Admitting: Registered Nurse

## 2021-01-03 ENCOUNTER — Encounter: Payer: Self-pay | Admitting: Registered Nurse

## 2021-01-03 ENCOUNTER — Other Ambulatory Visit: Payer: Self-pay

## 2021-01-03 VITALS — BP 125/79 | HR 101 | Temp 98.3°F | Ht 72.0 in | Wt 180.0 lb

## 2021-01-03 DIAGNOSIS — G931 Anoxic brain damage, not elsewhere classified: Secondary | ICD-10-CM

## 2021-01-03 DIAGNOSIS — Y92009 Unspecified place in unspecified non-institutional (private) residence as the place of occurrence of the external cause: Secondary | ICD-10-CM | POA: Insufficient documentation

## 2021-01-03 DIAGNOSIS — M25571 Pain in right ankle and joints of right foot: Secondary | ICD-10-CM

## 2021-01-03 DIAGNOSIS — M79671 Pain in right foot: Secondary | ICD-10-CM

## 2021-01-03 DIAGNOSIS — Z79891 Long term (current) use of opiate analgesic: Secondary | ICD-10-CM

## 2021-01-03 DIAGNOSIS — R208 Other disturbances of skin sensation: Secondary | ICD-10-CM

## 2021-01-03 DIAGNOSIS — W19XXXD Unspecified fall, subsequent encounter: Secondary | ICD-10-CM

## 2021-01-03 DIAGNOSIS — Z5181 Encounter for therapeutic drug level monitoring: Secondary | ICD-10-CM

## 2021-01-03 DIAGNOSIS — G894 Chronic pain syndrome: Secondary | ICD-10-CM

## 2021-01-03 NOTE — Progress Notes (Signed)
Subjective:    Patient ID: Nathaniel Lynn, male    DOB: 07-11-1990, 30 y.o.   MRN: [id number]  HPI: Nathaniel Lynn is a 30 y.o. male who is here for Transitional Care Visit for Follow up of his Anoxic Brain Injury, Dysesthesia RLE and Right ankle pain.  [name] was admitted to [facility] on 11/10/2020 after being found unresponsive : Per Dr Nathaniel Lynn Note:  The sister was finally able to get another family member to go to the patient's girlfriend's house in search of her brother.  When she arrived around 3 pm, she found her brother unresponsive.   The girlfriend confirmed that they were getting "high". The girlfriend reported that she had given the patient narcan but she was still unable to arouse him.  Hence at that time 911 was called and the patient was taken to [facility] for evaluation.  CT Head WO Contrast:  : Normal appearing brain parenchyma. No mass, hemorrhage, or extra-axial fluid collection.   Small right frontal scalp hematoma with underlying bony calvarium intact.   Paranasal sinus disease at several sites noted.  MR Brain WO Contrast:  IMPRESSION: Subtle confluent area of mildly restricted diffusion predominantly in the bilateral perirolandic area, nonspecific. Differential diagnosis include hypoxic ischemic encephalopathy, uremic encephalopathy and other toxic/metabolic leukoencephalopathies Minimal corresponding high signal on T2/FLAIR.  [name] was transferred to [facility], he underwent EEG, on 11/17/2020  IMPRESSION: This study is suggestive of severe diffuse encephalopathy, nonspecific etiology but likely related to sedation. No seizures or epileptiform discharges were seen throughout the recording.  [name] was admitted to inpatient rehabilitation on 12/07/2020 and discharged home on 12/21/2020. He is receiving outpatient therapy at [facility]. He states his pain is located in his right foot. He rates his pain 6. He is  walking with his crutches.   [name] reports he has had three falls since discharge, he states he has lost his footing. He was educated on falls prevention he verbalizes understanding.  He was in the ED on 12/26/2020 for right foot pain and had a fall, note was reviewed. Also states since his fall has increase right ankle pain, we will obtain a X-ray, he verbalizes understanding. He asked for something for pain, we discussed his history of polysubstance abuse, we will obtain a oral swab today and the above will be discussed with Dr Nathaniel Lynn, he verbalizes understanding.   [name] reports he has a good appetite.   Sister Nathaniel Lynn in room.     Pain Inventory Average Pain 8 Pain Right Now 6 My pain is sharp, burning, stabbing, tingling, and aching  LOCATION OF PAIN  LEG ANKLE TOES  BOWEL Number of stools per week: 6 Oral laxative use Yes  Type of laxative MIRALAX Enema or suppository use No  History of colostomy No  Incontinent No   BLADDER Normal In and out cath, frequency  Able to self cath  N/A Bladder incontinence No  Frequent urination No  Leakage with coughing No  Difficulty starting stream No  Incomplete bladder emptying No    Mobility walk with assistance ability to climb steps?  no do you drive?  no  Function not employed: date last employed 3 MONTHS AGO  Neuro/Psych tremor tingling trouble walking spasms  Prior Studies N/A  Physicians involved in your care N/A   No family history on file. Social History   Socioeconomic History   Marital status: Single    Spouse name: Not on file  Number of children: Not on file   Years of education: Not on file   Highest education level: Not on file  Occupational History   Not on file  Tobacco Use   Smoking status: Unknown   Smokeless tobacco: Never  Substance and Sexual Activity   Alcohol use: Yes   Drug use: Not Currently   Sexual activity: Not on file  Other Topics Concern   Not on file   Social History Narrative   ** Merged History Encounter **       Social Determinants of Health   Financial Resource Strain: Not on file  Food Insecurity: Not on file  Transportation Needs: Not on file  Physical Activity: Not on file  Stress: Not on file  Social Connections: Not on file   No past surgical history on file. Past Medical History:  Diagnosis Date   IVDU (intravenous drug user)    BP 125/79 (BP Location: Right Arm, Patient Position: Sitting, Cuff Size: Large)   Pulse (!) 101   Temp 98.3 F (36.8 C) (Oral)   Ht 6' (1.829 m)   Wt 180 lb (81.6 kg)   SpO2 97%   BMI 24.41 kg/m   Opioid Risk Score:   Fall Risk Score:  `1  Depression screen PHQ 2/9  No flowsheet data found.   Review of Systems  Constitutional: Negative.   HENT: Negative.    Eyes: Negative.   Respiratory: Negative.    Cardiovascular: Negative.   Gastrointestinal: Negative.   Endocrine: Negative.   Genitourinary: Negative.   Musculoskeletal:  Positive for gait problem.       PAIN IN LEFT LEG , SWELLING , SPASMS  Skin: Negative.   Allergic/Immunologic: Negative.   Hematological: Negative.   Psychiatric/Behavioral: Negative.        Objective:   Physical Exam Vitals and nursing note reviewed.  Constitutional:      Appearance: Normal appearance.  Cardiovascular:     Rate and Rhythm: Normal rate and regular rhythm.     Pulses: Normal pulses.     Heart sounds: Normal heart sounds.  Pulmonary:     Effort: Pulmonary effort is normal.     Breath sounds: Normal breath sounds.  Musculoskeletal:     Cervical back: Normal range of motion and neck supple.     Comments: Normal Muscle Bulk and Muscle Testing Reveals:  Upper Extremities: Full ROM and Muscle Strength 5/5 Lower Extremities: Right: Decreased ROM and Muscle Strength 5/5 Right foot with swelling noted Left Lower Extremity: Full ROM and Muscle Strength 5/5 Arises from Table Slowly using crutches for support Antalgic  Gait      Skin:    General: Skin is warm and dry.  Neurological:     Mental Status: He is alert and oriented to person, place, and time.  Psychiatric:        Mood and Affect: Mood normal.        Behavior: Behavior normal.         Assessment & Plan:  Anoxic Brain Injury,: Continue Outpatient Therapy at [facility]. Continue to Monitor.   Dysesthesia RLE : Continue Lyrica and  Continue to Monitor.  Right ankle pain: RX: X-ray . We will continue to monitor.   Fall at Home: Educated on [employer], he verbalizes understanding.

## 2021-01-04 ENCOUNTER — Emergency Department: Payer: Self-pay

## 2021-01-04 ENCOUNTER — Other Ambulatory Visit: Payer: Self-pay

## 2021-01-04 ENCOUNTER — Emergency Department
Admission: EM | Admit: 2021-01-04 | Discharge: 2021-01-04 | Disposition: A | Discharge status: Home / Self Care | Payer: Self-pay | Attending: Student in an Organized Health Care Education/Training Program | Admitting: Student in an Organized Health Care Education/Training Program

## 2021-01-04 DIAGNOSIS — I82441 Acute embolism and thrombosis of right tibial vein: Secondary | ICD-10-CM | POA: Insufficient documentation

## 2021-01-04 DIAGNOSIS — Z7901 Long term (current) use of anticoagulants: Secondary | ICD-10-CM | POA: Insufficient documentation

## 2021-01-04 DIAGNOSIS — F17210 Nicotine dependence, cigarettes, uncomplicated: Secondary | ICD-10-CM | POA: Insufficient documentation

## 2021-01-04 MED ORDER — OXYCODONE-ACETAMINOPHEN 7.5-325 MG PO TABS: 1.0000 | ORAL_TABLET | Freq: Four times a day (QID) | ORAL | 0 refills | Status: AC | PRN

## 2021-01-04 MED ORDER — HYDROMORPHONE HCL 1 MG/ML IJ SOLN
1.0000 mg | Freq: Once | INTRAMUSCULAR | Status: AC
  Administered 2021-01-04: 1 mg via INTRAMUSCULAR
  Filled 2021-01-04: qty 1

## 2021-01-04 MED ORDER — RIVAROXABAN (XARELTO) VTE STARTER PACK (15 & 20 MG): ORAL_TABLET | ORAL | 0 refills | Status: DC

## 2021-01-04 NOTE — ED Provider Notes (Signed)
[facility] Emergency Department Provider Note   ____________________________________________   Event Date/Time   First MD Initiated Contact with Patient 01/04/21 1331     (approximate)  I have reviewed the triage vital signs and the nursing notes.   HISTORY  Chief Complaint Foot Pain    HPI Nathaniel Lynn is a 30 y.o. male patient presents with right foot and ankle pain.  Patient that he believes he injured his foot early last month..  Patient ambulates with with crutches.  Patient has negative x-ray of the right foot on 11/30/2020.  Patient also had a negative CT of the right foot on 12/08/2020.  Patient stated pain concentrated more in the ankle.  Patient state he was sent over by his doctor to have x-ray of the ankle.  Rates pain as a 10/10.  Described pain as "achy".  No palliative measure for complaint.  Patient history is remarkable for Anoxic brain injury, sciatic nerve injury, paresthesias of the right lower extremity.      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 No pertinent surgical history.  Prior to Admission medications   Medication Sig Start Date End Date Taking? Authorizing Provider  oxyCODONE-acetaminophen (PERCOCET) 7.5-325 MG tablet Take 1 tablet by mouth every 6 (six) hours as needed for severe pain. 01/04/21  Yes Joni Reining, PA-C  RIVAROXABAN Carlena Hurl) VTE STARTER PACK (15 & 20 MG) Follow package directions: Take one 15mg  tablet by  mouth twice a day. On day 22, switch to one 20mg  tablet once a day. Take with food. 01/04/21  Yes , PA-C  acetaminophen (TYLENOL) 325 MG tablet Take 1-2 tablets (325-650 mg total) by mouth every 4 (four) hours as needed for mild pain. 12/20/20   Love, Joni Reining, PA-C  ascorbic acid (VITAMIN C) 500 MG tablet Take 1 tablet (500 mg total) by mouth daily. 12/21/20   Love, Evlyn Kanner, PA-C  folic acid (FOLVITE) 1 MG tablet Take 1 tablet (1 mg total) by mouth daily. 12/20/20   Love, Evlyn Kanner, PA-C  methocarbamol (ROBAXIN) 500 MG tablet Take 1 tablet (500 mg total) by mouth every 6 (six) hours as needed for muscle spasms. 12/20/20   Love, Evlyn Kanner, PA-C  Mouthwashes (MOUTH RINSE) LIQD solution 15 mLs by Mouth Rinse route 2 (two) times daily. 12/07/20   Pokhrel, Evlyn Kanner, MD  Multiple Vitamins-Minerals (CERTAVITE/ANTIOXIDANTS) TABS Take 1 tablet by mouth daily. 12/20/20   Love, Rebekah Chesterfield, PA-C  mupirocin cream (BACTROBAN) 2 % Apply topically daily. 12/07/20   Pokhrel, Evlyn Kanner, MD  Nutritional Supplements (,FEEDING SUPPLEMENT, PROSOURCE PLUS) liquid Take 30 mLs by mouth 2 (two) times daily between meals. 12/07/20   Pokhrel, Rebekah Chesterfield, MD  polyethylene glycol powder (GLYCOLAX/MIRALAX) 17 GM/SCOOP powder dissolve and Take 1 capful (17 g) by mouth daily. 12/20/20   Love, Rebekah Chesterfield, PA-C  polyvinyl alcohol (LIQUIFILM TEARS) 1.4 % ophthalmic solution Place 1 drop into both eyes 3 (three)  times daily. 12/20/20   Love, Evlyn Kanner, PA-C  pregabalin (LYRICA) 100 MG capsule Take 1 capsule (100 mg total) by mouth 3 (three) times daily. 12/20/20   Love, Evlyn Kanner, PA-C  QUEtiapine (SEROQUEL) 50 MG tablet Take 1 tablet (50 mg total) by mouth in the morning AND 2 tablets (100 mg total) at bedtime. 12/21/20   Love, Evlyn Kanner, PA-C  thiamine 100 MG tablet Take 1 tablet (100 mg total) by mouth daily. 12/20/20   Love, Evlyn Kanner, PA-C  traZODone (DESYREL) 100 MG tablet Take 1 tablet (100 mg total) by mouth at bedtime. 12/20/20   Love, Evlyn Kanner,  PA-C  Zinc Sulfate 220 (50 Zn) MG TABS Take 1 tablet (220 mg total) by mouth daily. 12/21/20   Love, Evlyn Kanner, PA-C    Allergies Penicillins and Penicillins  No family history on file.  Social History Social History   Tobacco Use   Smoking status: Every Day    Pack years: 0.00    Types: Cigarettes   Smokeless tobacco: Never  Substance Use Topics   Alcohol use: Yes   Drug use: Yes    Types: IV    Review of Systems Constitutional: No fever/chills Eyes: No visual changes. ENT: No sore throat. Cardiovascular: Denies chest pain. Respiratory: Denies shortness of breath. Gastrointestinal: No abdominal pain.  No nausea, no vomiting.  No diarrhea.  No constipation. Genitourinary: Negative for dysuria. Musculoskeletal: Right lower extremity pain. Skin: Negative for rash. Neurological: Negative for headaches, focal weakness or numbness. Allergic/Immunilogical: Penicillin  ____________________________________________   PHYSICAL EXAM:  VITAL SIGNS: ED Triage Vitals  Enc Vitals Group     BP 01/04/21 1249 (!) 132/92     Pulse Rate 01/04/21 1249 90     Resp 01/04/21 1249 17     Temp 01/04/21 1249 98.3 F (36.8 C)     Temp Source 01/04/21 1249 Oral     SpO2 01/04/21 1249 97 %     Weight 01/04/21 1308 179 lb 14.3 oz (81.6 kg)     Height 01/04/21 1308 6' (1.829 m)     Head Circumference --      Peak Flow --      Pain Score 01/04/21 1250 10     Pain Loc --      Pain Edu? --      Excl. in GC? --     Constitutional: Alert and oriented. Well appearing and in no acute distress. Eyes: Conjunctivae are normal. PERRL. EOMI. Head: Atraumatic. Nose: No congestion/rhinnorhea. Mouth/Throat: Mucous membranes are moist.  Oropharynx non-erythematous. Neck: No stridor.   Hematological/Lymphatic/Immunilogical: No cervical lymphadenopathy. Cardiovascular: Normal rate, regular rhythm. Grossly normal heart sounds.  Decreased peripheral circulation right lower extremity.  Elevated blood  pressure. Respiratory: Normal respiratory effort.  No retractions. Lungs CTAB. Gastrointestinal: Soft and nontender. No distention. No abdominal bruits. No CVA tenderness. Musculoskeletal: No lower extremity tenderness nor edema.  No joint effusions. Neurologic:  Normal speech and language. No gross focal neurologic deficits are appreciated. No gait instability. Skin:  Skin is warm, dry and intact. No rash noted. Psychiatric: Mood and affect are normal. Speech and behavior are normal.  ____________________________________________   LABS (all labs ordered are listed, but only abnormal results are displayed)  Labs Reviewed - No data to display ____________________________________________  EKG   ____________________________________________  RADIOLOGY I, Joni Reining, personally viewed and evaluated these images (plain radiographs) as part of my medical decision making, as well as reviewing the written report by the radiologist.  ED MD interpretation:    Official radiology report(s): DG Ankle Complete Right  Result Date: 01/04/2021 CLINICAL DATA:  Right ankle pain and swelling EXAM: RIGHT ANKLE - COMPLETE 3+ VIEW COMPARISON:  X-ray 12/26/2020 FINDINGS: There is no evidence of fracture, dislocation, or joint effusion. There is no evidence of arthropathy or other focal bone abnormality. Mild diffuse soft tissue edema. IMPRESSION: No fracture or dislocation of the right ankle. Mild diffuse soft tissue edema. Electronically Signed   By: Duanne GuessNicholas  Plundo D.O.   On: 01/04/2021 14:35   US Venous Img Lower Unilateral Right  Result Date: 01/04/2021 CLINICAL DATA:  30 year old male with a history of pain and edema for 2 weeks EXAM: RIGHT LOWER EXTREMITY VENOUS DOPPLER ULTRASOUND TECHNIQUE: Gray-scale sonography with graded compression, as well as color Doppler and duplex ultrasound were performed to evaluate the lower extremity deep venous systems from the level of the common femoral vein and  including the common femoral, femoral, profunda femoral, popliteal and calf veins including the posterior tibial, peroneal and gastrocnemius veins when visible. The superficial great saphenous vein was also interrogated. Spectral Doppler was utilized to evaluate flow at rest and with distal augmentation maneuvers in the common femoral, femoral and popliteal veins. COMPARISON:  None. FINDINGS: Contralateral Common Femoral Vein: Respiratory phasicity is normal and symmetric with the symptomatic side. No evidence of thrombus. Normal compressibility. Common Femoral Vein: No evidence of thrombus. Normal compressibility, respiratory phasicity and response to augmentation. Saphenofemoral Junction: No evidence of thrombus. Normal compressibility and flow on color Doppler imaging. Profunda Femoral Vein: No evidence of thrombus. Normal compressibility and flow on color Doppler imaging. Femoral Vein: No evidence of thrombus. Normal compressibility, respiratory phasicity and response to augmentation. Popliteal Vein: No evidence of thrombus. Normal compressibility, respiratory phasicity and response to augmentation. Calf Veins: Short segment of posterior tibial vein is noncompressible with thrombus. Peroneal vein patent. No extension into the popliteal vein. Superficial Great Saphenous Vein: No evidence of thrombus. Normal compressibility and flow on color Doppler imaging. Other Findings:  None. IMPRESSION: Positive for short segment DVT of the right posterior tibial vein, without extension into the popliteal vein. Sonographic survey of the right lower extremity is negative for proximal DVT of the common femoral vein, profunda vein, or femoral vein. Negative for DVT of the popliteal vein. Electronically Signed   By: Gilmer MorJaime  Wagner D.O.   On: 01/04/2021 15:35    ____________________________________________   PROCEDURES  Procedure(s) performed (including Critical  Care):  Procedures   ____________________________________________   INITIAL IMPRESSION / ASSESSMENT AND PLAN / ED COURSE  As part of my medical decision making, I reviewed the following data within the electronic MEDICAL RECORD NUMBER         Patient presents right lower extremity pain.  Differentials consist of fractured ankle versus DVT.  Definitive evaluation with ultrasound is warranted.  Discussed ultrasound findings with patient which is consistent with a short segment DVT of the right posterior tibial vein.  Patient given discharge care instructions started on Xarelto.  Patient will follow-up with vascular clinic by calling for an appointment Monday morning.  Return to ED if condition worsens i.e. chest pain shortness of breath.      ____________________________________________   FINAL CLINICAL IMPRESSION(S) / ED DIAGNOSES  Final diagnoses:  Acute deep vein thrombosis (DVT) of tibial vein of right lower extremity [facility](HCC)     ED Discharge Orders          Ordered    RIVAROXABAN (XARELTO) VTE STARTER PACK (15 &  20 MG)        01/04/21 1610    oxyCODONE-acetaminophen (PERCOCET) 7.5-325 MG tablet  Every 6 hours PRN        01/04/21 1612             Note:  This document was prepared using Dragon voice recognition software and may include unintentional dictation errors.    Joni Reining, PA-C 01/04/21 1624    Willy Eddy, MD 01/07/21 1034

## 2021-01-04 NOTE — ED Notes (Signed)
See triage note  Presents with pain and swelling to right foot and ankle  States he was told he had some nerve damage to right leg couple of month ago  Also has had some recent falls

## 2021-01-04 NOTE — ED Triage Notes (Signed)
Pt states he been reinjurying his right foot and ankle , pt arrives on crutches with noted swelling to the right foot

## 2021-01-04 NOTE — Discharge Instructions (Addendum)
Read and follow discharge care instructions.  Take medication as directed.  Call the vascular clinic listed in your discharge care instructions Monday morning for further evaluation.

## 2021-01-08 ENCOUNTER — Ambulatory Visit: Payer: Medicaid Other

## 2021-01-08 ENCOUNTER — Ambulatory Visit: Payer: Medicaid Other | Attending: Physical Medicine and Rehabilitation | Admitting: Speech Pathology

## 2021-01-08 ENCOUNTER — Other Ambulatory Visit: Payer: Self-pay

## 2021-01-08 DIAGNOSIS — R41841 Cognitive communication deficit: Secondary | ICD-10-CM | POA: Insufficient documentation

## 2021-01-08 DIAGNOSIS — G931 Anoxic brain damage, not elsewhere classified: Secondary | ICD-10-CM | POA: Insufficient documentation

## 2021-01-09 NOTE — Patient Instructions (Signed)
Play novel card game and keep score, use memory book

## 2021-01-09 NOTE — Therapy (Signed)
[facility] MAIN [facility] SERVICES [address] [place], [place], [address] Phone: [phone]   Fax:  [phone]  Speech Language Pathology Treatment  Patient Details  Name: Nathaniel Lynn MRN: [id number] Date of Birth: Jul 12, 1990 Referring Provider (SLP): Delle Reining   Encounter Date: 01/08/2021   End of Session - 01/09/21 1912     Visit Number 2    Number of Visits 25    Date for SLP Re-Evaluation 03/21/21    Authorization Type Medicaid    Authorization Time Period 12/27/2020 thru 03/21/2021    Authorization - Visit Number 2    Progress Note Due on Visit 10    SLP Start Time 0900    SLP Stop Time  1000    SLP Time Calculation (min) 60 min    Activity Tolerance Patient tolerated treatment well             Past Medical History:  Diagnosis Date   IVDU (intravenous drug user)     No past surgical history on file.  There were no vitals filed for this visit.   Subjective Assessment - 01/09/21 1911     Subjective "my seroquel makes me so sleepy, I can barely keep my eyes open now"    Patient is accompained by: Family member    Currently in Pain? No/denies                   ADULT SLP TREATMENT - 01/09/21 0001       Treatment Provided   Treatment provided Cognitive-Linquistic      Cognitive-Linquistic Treatment   Treatment focused on Cognition;Patient/family/caregiver education    Skilled Treatment COGNITION: Memory - Maximal multimodal assistance to create list of medications, recall information from recent ED visit and dx of DVT, memory book introduced, moderate assistance for selective attention to task              SLP Education - 01/09/21 1912     Education Details external memory aids, attention, awareness    Person(s) Educated Patient;Parent(s)    Methods Explanation;Demonstration;Tactile cues;Handout;Verbal cues    Comprehension Verbal cues required;Need further instruction              SLP  Short Term Goals - 12/27/20 1249       SLP SHORT TERM GOAL #1   Title With moderate cues, pt will demonstrate selective attention to task in a mildly distracting environment for 15 minutes.    Baseline new goal    Time 10    Period --   sessions   Status New      SLP SHORT TERM GOAL #2   Title With moderate cues, pt will demonstrate intellectual awareness by identifying 3 cognitive and 3 physical deficits related to anoxic brain injury.    Baseline new goal    Time 10    Period --   sessions   Status New      SLP SHORT TERM GOAL #3   Title Pt will use compensatory memory strategies to recall orientation information in 9 out of 10 sessions.    Baseline new goal    Time 10    Period --   sessions   Status New      SLP SHORT TERM GOAL #4   Title With moderate cues, pt will complete medication management tasks with 75% accuracy.    Baseline new goal    Time 10    Period --   sessions  Status New      SLP SHORT TERM GOAL #5   Title With moderate cues, pt will complete money management tasks with 75% accuracy.    Baseline new goal    Time 10    Period --   sessions   Status New              SLP Long Term Goals - 12/27/20 1259       SLP LONG TERM GOAL #1   Title With supervision, pt will demonstrate anticipatory awareness by providing safe solution to hypothetical situation.    Baseline new goal    Time 12    Period Weeks    Status New    Target Date 03/21/21      SLP LONG TERM GOAL #2   Title With moderate cues, pt will demonstrate alternating attention in  a mildly distracting environment for 10 minutes.    Baseline new goal    Time 12    Period Weeks    Status New    Target Date 03/21/21      SLP LONG TERM GOAL #3   Title With supervision cues, pt will use compensatory memory strategies to recall important dates (such as doctor's appts) with 75% accuracy.    Baseline new goal    Time 12    Period Weeks    Status New    Target Date 03/21/21      SLP  LONG TERM GOAL #4   Title With supervision cues, pt will complete money and medication management in 7 out of 10 opportunties.    Baseline new goal    Time 12    Period Weeks    Status New    Target Date 03/21/21              Plan - 01/09/21 1914     Clinical Impression Statement Pt presents with moderate deficits in memory (recall of new information, delayed recall, storage), attention, complex reasoning, task tolerance, awareness of deficits, impulsivity, insight regarding safety awareness. Despite taking classes towards his GED, pt demonstrates errors in spelling. During today's session, SLP introduced ways to promote pt's functional independence thru use of compensatory memory aids. Pt was receptive of concept. At this time, skilled ST services continue to be recommended to address the above mentioned deficits to increase pt's functional independence, return to work and leisure activities and reduce caregiver burden.    Speech Therapy Frequency 1x /week    Duration 12 weeks    Treatment/Interventions Compensatory strategies;Cueing hierarchy;Functional tasks;Patient/family education;Cognitive reorganization;SLP instruction and feedback;Internal/external aids    Potential to Achieve Goals Fair    Potential Considerations Previous level of function;Co-morbidities;Severity of impairments;Other (comment)   hx of polysubstance abuse   SLP Home Exercise Plan provided, see pt. 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 Dreama Saa M.S., CCC-SLP, [facility] Speech-Language Pathologist Rehabilitation Services Office [phone]  Reuel Derby 01/09/2021, 7:16 PM  [facility] [place] [facility] MAIN [facility] SERVICES [address] [place], [place], [address] Phone: [phone]   Fax:  [phone]   Name: Nathaniel Lynn MRN: [id number] Date of Birth: [birth year]

## 2021-01-10 ENCOUNTER — Other Ambulatory Visit: Payer: Self-pay

## 2021-01-10 ENCOUNTER — Ambulatory Visit: Payer: Medicaid Other | Admitting: Speech Pathology

## 2021-01-10 DIAGNOSIS — R41841 Cognitive communication deficit: Secondary | ICD-10-CM

## 2021-01-10 DIAGNOSIS — G931 Anoxic brain damage, not elsewhere classified: Secondary | ICD-10-CM

## 2021-01-10 LAB — DRUG TOX MONITOR 1 W/CONF, ORAL FLD
" Methamphetamine": >500 ng/mL — ABNORMAL HIGH
Amphetamine: NEGATIVE ng/mL
Amphetamines: POSITIVE ng/mL — AB
Barbiturates: NEGATIVE ng/mL
Benzodiazepines: NEGATIVE ng/mL
Buprenorphine: NEGATIVE ng/mL
Cocaine: NEGATIVE ng/mL
Cotinine: 191.5 ng/mL — ABNORMAL HIGH
Fentanyl: 3.16 ng/mL — ABNORMAL HIGH
Fentanyl: POSITIVE ng/mL — AB
Heroin Metabolite: NEGATIVE ng/mL
MARIJUANA: POSITIVE ng/mL — AB
MDMA: NEGATIVE ng/mL
Meprobamate: NEGATIVE ng/mL
Methadone: NEGATIVE ng/mL
Nicotine Metabolite: POSITIVE ng/mL — AB
Opiates: NEGATIVE ng/mL
Phencyclidine: NEGATIVE ng/mL
THC: 32.2 ng/mL — ABNORMAL HIGH
Tapentadol: NEGATIVE ng/mL
Tramadol: NEGATIVE ng/mL
Zolpidem: NEGATIVE ng/mL

## 2021-01-10 LAB — DRUG TOX ALC METAB W/CON, ORAL FLD: Alcohol Metabolite: NEGATIVE ng/mL (ref <25)

## 2021-01-11 NOTE — Therapy (Addendum)
[place] [facility] MAIN [facility] SERVICES [address] [place], [place], [address] Phone: [phone]   Fax:  [phone]  Speech Language Pathology Treatment  Patient Details  Name: Nathaniel Lynn MRN: [id number] Date of Birth: 1990/10/18 Referring Provider (SLP): Delle Reining   Encounter Date: 01/10/2021   End of Session - 01/11/21 1819     Visit Number 3    Number of Visits 25    Date for SLP Re-Evaluation 03/21/21    Authorization Type Medicaid    Authorization Time Period 12/27/2020 thru 03/21/2021    Authorization - Visit Number 3    Progress Note Due on Visit 10    SLP Start Time 1310    SLP Stop Time  1400    SLP Time Calculation (min) 50 min    Activity Tolerance Patient tolerated treatment well             Past Medical History:  Diagnosis Date   IVDU (intravenous drug user)     No past surgical history on file.  There were no vitals filed for this visit.   Subjective Assessment - 01/11/21 1817     Subjective "we didn't play the game, we have to get some cards"    Patient is accompained by: Family member    Currently in Pain? No/denies                   ADULT SLP TREATMENT - 01/11/21 0001       Treatment Provided   Treatment provided Cognitive-Linquistic      Cognitive-Linquistic Treatment   Treatment focused on Cognition;Patient/family/caregiver education    Skilled Treatment COGNITION: Memory - Mod I with recall of novel semi-complex card game, maximal assistance to recall scoring of game, minimal cues to problem solve game             SLP Education - 01/11/21 1819     Education Details external memory aids    Person(s) Educated Patient;Parent(s)    Methods Explanation;Demonstration    Comprehension Verbalized understanding;Need further instruction              SLP Short Term Goals - 12/27/20 1249       SLP SHORT TERM GOAL #1   Title With moderate cues, pt will demonstrate selective  attention to task in a mildly distracting environment for 15 minutes.    Baseline new goal    Time 10    Period --   sessions   Status New      SLP SHORT TERM GOAL #2   Title With moderate cues, pt will demonstrate intellectual awareness by identifying 3 cognitive and 3 physical deficits related to anoxic brain injury.    Baseline new goal    Time 10    Period --   sessions   Status New      SLP SHORT TERM GOAL #3   Title Pt will use compensatory memory strategies to recall orientation information in 9 out of 10 sessions.    Baseline new goal    Time 10    Period --   sessions   Status New      SLP SHORT TERM GOAL #4   Title With moderate cues, pt will complete medication management tasks with 75% accuracy.    Baseline new goal    Time 10    Period --   sessions   Status New      SLP SHORT TERM GOAL #5  Title With moderate cues, pt will complete money management tasks with 75% accuracy.    Baseline new goal    Time 10    Period --   sessions   Status New              SLP Long Term Goals - 12/27/20 1259       SLP LONG TERM GOAL #1   Title With supervision, pt will demonstrate anticipatory awareness by providing safe solution to hypothetical situation.    Baseline new goal    Time 12    Period Weeks    Status New    Target Date 03/21/21      SLP LONG TERM GOAL #2   Title With moderate cues, pt will demonstrate alternating attention in  a mildly distracting environment for 10 minutes.    Baseline new goal    Time 12    Period Weeks    Status New    Target Date 03/21/21      SLP LONG TERM GOAL #3   Title With supervision cues, pt will use compensatory memory strategies to recall important dates (such as doctor's appts) with 75% accuracy.    Baseline new goal    Time 12    Period Weeks    Status New    Target Date 03/21/21      SLP LONG TERM GOAL #4   Title With supervision cues, pt will complete money and medication management in 7 out of 10  opportunties.    Baseline new goal    Time 12    Period Weeks    Status New    Target Date 03/21/21              Plan - 01/11/21 1820     Clinical Impression Statement Pt presents with moderate deficits in memory (recall of new information, delayed recall, storage), attention, complex reasoning, task tolerance, awareness of deficits, impulsivity, insight regarding safety awareness. At this time, skilled ST services continue to be recommended to address the above mentioned deficits to increase pt's functional independence, return to work and leisure activities and reduce caregiver burden.    Speech Therapy Frequency 2x / week    Duration 12 weeks    Treatment/Interventions Compensatory strategies;Cueing hierarchy;Functional tasks;Patient/family education;Cognitive reorganization;SLP instruction and feedback;Internal/external aids    Potential to Achieve Goals Fair    Potential Considerations Previous level of function;Co-morbidities;Severity of impairments;Other (comment)    SLP Home Exercise Plan provided, see pt. 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 Dreama Saa M.S., CCC-SLP, [facility] Speech-Language  Pathologist Rehabilitation Services Office [phone]  Reuel Derby 01/11/2021, 6:21 PM  [place] [facility]  MAIN [facility] SERVICES [address] [place], [place], [address] Phone: [phone]   Fax:  [phone]   Name: Nathaniel Lynn MRN: [id number] Date of Birth: [birth year]

## 2021-01-15 ENCOUNTER — Ambulatory Visit: Payer: Self-pay | Admitting: Speech Pathology

## 2021-01-15 ENCOUNTER — Other Ambulatory Visit: Payer: Self-pay

## 2021-01-15 DIAGNOSIS — R41841 Cognitive communication deficit: Secondary | ICD-10-CM

## 2021-01-15 DIAGNOSIS — G931 Anoxic brain damage, not elsewhere classified: Secondary | ICD-10-CM

## 2021-01-15 NOTE — Therapy (Signed)
[facility] MAIN [facility] SERVICES [address] [place], [place], [address] Phone: [phone]   Fax:  [phone]  Speech Language Pathology Treatment  Patient Details  Name: Nathaniel Lynn MRN: [id number] Date of Birth: 1991-01-17 Referring Provider (SLP): Delle Reining   Encounter Date: 01/15/2021   End of Session - 01/15/21 1634     Visit Number 4    Number of Visits 25    Date for SLP Re-Evaluation 03/21/21    Authorization Type Medicaid    Authorization Time Period 12/27/2020 thru 03/21/2021    Authorization - Visit Number 4    Progress Note Due on Visit 10    SLP Start Time 1000    SLP Stop Time  1100    SLP Time Calculation (min) 60 min    Activity Tolerance Patient tolerated treatment well             Past Medical History:  Diagnosis Date   IVDU (intravenous drug user)     No past surgical history on file.  There were no vitals filed for this visit.   Subjective Assessment - 01/15/21 1632     Subjective "I have an appt with a cardiologist"    Patient is accompained by: Family member    Currently in Pain? No/denies                   ADULT SLP TREATMENT - 01/15/21 0001       Treatment Provided   Treatment provided Cognitive-Linquistic      Cognitive-Linquistic Treatment   Treatment focused on Cognition;Patient/family/caregiver education    Skilled Treatment COGNITION: MEMORY: basic mental manipulation 75% independent accuracy improving to 100% with minimal cues for use of repeating constraints; 90% accuracy following 2 step auditory directions; 77% independent accuracy recalling rules of novel semi-complex card game, improving to 100% with supervision cues.              SLP Education - 01/15/21 1634     Education Details internal and external memory aids    Person(s) Educated Patient;Parent(s)    Methods Explanation;Demonstration;Verbal cues    Comprehension Verbalized understanding;Returned  demonstration;Need further instruction;Verbal cues required              SLP Short Term Goals - 12/27/20 1249       SLP SHORT TERM GOAL #1   Title With moderate cues, pt will demonstrate selective attention to task in a mildly distracting environment for 15 minutes.    Baseline new goal    Time 10    Period --   sessions   Status New      SLP SHORT TERM GOAL #2   Title With moderate cues, pt will demonstrate intellectual awareness by identifying 3 cognitive and 3 physical deficits related to anoxic brain injury.    Baseline new goal    Time 10    Period --   sessions   Status New      SLP SHORT TERM GOAL #3   Title Pt will use compensatory memory strategies to recall orientation information in 9 out of 10 sessions.    Baseline new goal    Time 10    Period --   sessions   Status New      SLP SHORT TERM GOAL #4   Title With moderate cues, pt will complete medication management tasks with 75% accuracy.    Baseline new goal    Time 10    Period --  sessions   Status New      SLP SHORT TERM GOAL #5   Title With moderate cues, pt will complete money management tasks with 75% accuracy.    Baseline new goal    Time 10    Period --   sessions   Status New              SLP Long Term Goals - 12/27/20 1259       SLP LONG TERM GOAL #1   Title With supervision, pt will demonstrate anticipatory awareness by providing safe solution to hypothetical situation.    Baseline new goal    Time 12    Period Weeks    Status New    Target Date 03/21/21      SLP LONG TERM GOAL #2   Title With moderate cues, pt will demonstrate alternating attention in  a mildly distracting environment for 10 minutes.    Baseline new goal    Time 12    Period Weeks    Status New    Target Date 03/21/21      SLP LONG TERM GOAL #3   Title With supervision cues, pt will use compensatory memory strategies to recall important dates (such as doctor's appts) with 75% accuracy.    Baseline new  goal    Time 12    Period Weeks    Status New    Target Date 03/21/21      SLP LONG TERM GOAL #4   Title With supervision cues, pt will complete money and medication management in 7 out of 10 opportunties.    Baseline new goal    Time 12    Period Weeks    Status New    Target Date 03/21/21              Plan - 01/15/21 1634     Clinical Impression Statement Pt presents with improving cognitive communication abilities as evidenced by his ability to recall rules within novel activities, use of internal memory aids as well as external memory aids. During today's session, pt required fewer cues/assitance to accurately implement strategies when performing basic functional tasks. He requires further skilled ST sessions to progress pt to semi-complex cognitive activities in order to increase pt's functional independence and reduce caregiver burden.    Speech Therapy Frequency 2x / week    Duration 12 weeks    Treatment/Interventions Compensatory strategies;Cueing hierarchy;Functional tasks;Patient/family education;Cognitive reorganization;SLP instruction and feedback;Internal/external aids    Potential to Achieve Goals Good    SLP Home Exercise Plan provided, see pt. 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 Dreama Saa, M.S., CCC-SLP, CBIS Speech-Language Pathologist Rehabilitation  Services Office [phone]  Reuel Derby 01/15/2021, 4:44 PM  [place] [facility] MAIN [facility] SERVICES [address] [place], [place], [address] Phone: [phone]   Fax:  [phone]   Name: Nathaniel Lynn MRN: [id number] Date of Birth: May 06, 1991

## 2021-01-15 NOTE — Patient Instructions (Addendum)
Use external memory aids to play novel semi-complex card games at home

## 2021-01-17 ENCOUNTER — Other Ambulatory Visit: Payer: Self-pay

## 2021-01-17 ENCOUNTER — Encounter (INDEPENDENT_AMBULATORY_CARE_PROVIDER_SITE_OTHER): Payer: Self-pay | Admitting: Vascular Surgery

## 2021-01-17 ENCOUNTER — Ambulatory Visit: Payer: Medicaid Other | Admitting: Speech Pathology

## 2021-01-17 ENCOUNTER — Ambulatory Visit (INDEPENDENT_AMBULATORY_CARE_PROVIDER_SITE_OTHER): Payer: Self-pay | Admitting: Vascular Surgery

## 2021-01-17 VITALS — BP 128/81 | HR 95 | Resp 16 | Ht 72.0 in

## 2021-01-17 DIAGNOSIS — I82409 Acute embolism and thrombosis of unspecified deep veins of unspecified lower extremity: Secondary | ICD-10-CM | POA: Insufficient documentation

## 2021-01-17 DIAGNOSIS — G931 Anoxic brain damage, not elsewhere classified: Secondary | ICD-10-CM

## 2021-01-17 DIAGNOSIS — S7401XS Injury of sciatic nerve at hip and thigh level, right leg, sequela: Secondary | ICD-10-CM

## 2021-01-17 DIAGNOSIS — I82441 Acute embolism and thrombosis of right tibial vein: Secondary | ICD-10-CM

## 2021-01-17 DIAGNOSIS — R41841 Cognitive communication deficit: Secondary | ICD-10-CM

## 2021-01-17 MED ORDER — RIVAROXABAN 20 MG PO TABS: 20.0000 mg | ORAL_TABLET | Freq: Every day | ORAL | 2 refills | Status: AC

## 2021-01-17 MED ORDER — HYDROCODONE-ACETAMINOPHEN 5-325 MG PO TABS: 1.0000 | ORAL_TABLET | Freq: Four times a day (QID) | ORAL | 0 refills | Status: AC | PRN

## 2021-01-17 NOTE — Progress Notes (Signed)
MRN : [id number]  Nathaniel Lynn is a 30 y.o. (12/14/1990) male who presents with chief complaint of No chief complaint on file. Marland Kitchen.  History of Present Illness:   The patient presents to the office for evaluation of DVT.  DVT was identified at [facility] by Duplex ultrasound.  The initial symptoms were pain and swelling in the right lower extremity.  Has an underlying confounding factor the patient has significant sciatic nerve injury and has been walking with crutches secondary to severe weakness of his right lower extremity and atrophy of the right calf muscle.  The patient notes the leg continues to be very painful with dependency and swells quite a bite.  Symptoms are much better with elevation.  The patient notes minimal edema in the morning which steadily worsens throughout the day.    The patient has not been using compression therapy at this point.  No SOB or pleuritic chest pains.  No cough or hemoptysis.  No blood per rectum or blood in any sputum.  No excessive bruising per the patient.    Current Meds  Medication Sig   acetaminophen (TYLENOL) 325 MG tablet Take 1-2 tablets (325-650 mg total) by mouth every 4 (four) hours as needed for mild pain.   ascorbic acid (VITAMIN C) 500 MG tablet Take 1 tablet (500 mg total) by mouth daily.   folic acid (FOLVITE) 1 MG tablet Take 1 tablet (1 mg total) by mouth daily.   methocarbamol (ROBAXIN) 500 MG tablet Take 1 tablet (500 mg total) by mouth every 6 (six) hours as needed for muscle spasms.   Multiple Vitamins-Minerals (CERTAVITE/ANTIOXIDANTS) TABS Take 1 tablet by mouth daily.   oxyCODONE-acetaminophen (PERCOCET) 7.5-325 MG tablet Take 1 tablet by mouth every 6 (six) hours as needed for severe pain.   pregabalin (LYRICA) 100 MG capsule Take 1 capsule (100 mg total) by mouth 3 (three) times daily.   QUEtiapine (SEROQUEL) 50 MG tablet Take 1 tablet (50 mg total) by mouth in the morning AND 2 tablets (100 mg total) at bedtime.    RIVAROXABAN (XARELTO) VTE STARTER PACK (15 & 20 MG) Follow package directions: Take one 15mg  tablet by mouth twice a day. On day 22, switch to one 20mg  tablet once a day. Take with food.   thiamine 100 MG tablet Take 1 tablet (100 mg total) by mouth daily.   traZODone (DESYREL) 100 MG tablet Take 1 tablet (100 mg total) by mouth at bedtime.   Zinc Sulfate 220 (50 Zn) MG TABS Take 1 tablet (220 mg total) by mouth daily.    Past Medical History:  Diagnosis Date   DVT (deep venous thrombosis) (HCC)    IVDU (intravenous drug user)     No past surgical history on file.  Social History Social History   Tobacco Use   Smoking status: Every Day    Types: Cigarettes   Smokeless tobacco: Never  Substance Use Topics   Alcohol use: Yes   Drug use: Yes    Types: IV    Family History Family History  Problem Relation Age of Onset   Diabetes Maternal Grandmother    Colon cancer Other    Lung cancer Other   No family history of bleeding/clotting disorders, porphyria or autoimmune disease   Allergies  Allergen Reactions   Penicillins Other (See Comments)    unknown   Penicillins     Pt states he is allergic to penicillin. He said he was told that but he does not  know what the reaction is.     REVIEW OF SYSTEMS (Negative unless checked)  Constitutional: [] Weight loss  [] Fever  [] Chills Cardiac: [] Chest pain   [] Chest pressure   [] Palpitations   [] Shortness of breath when laying flat   [] Shortness of breath with exertion. Vascular:  [] Pain in legs with walking   [] Pain in legs at rest  [] History of DVT   [] Phlebitis   [] Swelling in legs   [] Varicose veins   [] Non-healing ulcers Pulmonary:   [] Uses home oxygen   [] Productive cough   [] Hemoptysis   [] Wheeze  [] COPD   [] Asthma Neurologic:  [] Dizziness   [] Seizures   [] History of stroke   [] History of TIA  [] Aphasia   [] Vissual changes   [] Weakness or numbness in arm   [x] Weakness or numbness in leg Musculoskeletal:   [] Joint swelling    [x] Joint pain   [] Low back pain Hematologic:  [] Easy bruising  [] Easy bleeding   [] Hypercoagulable state   [] Anemic Gastrointestinal:  [] Diarrhea   [] Vomiting  [] Gastroesophageal reflux/heartburn   [] Difficulty swallowing. Genitourinary:  [] Chronic kidney disease   [] Difficult urination  [] Frequent urination   [] Blood in urine Skin:  [] Rashes   [] Ulcers  Psychological:  [] History of anxiety   []  History of major depression.  Physical Examination  Vitals:   01/17/21 1539  BP: 128/81  Pulse: 95  Resp: 16  Height: 6' (1.829 m)   Body mass index is 24.4 kg/m. Gen: WD/WN, mild to moderate distress Head: [place]/AT, No temporalis wasting.  Ear/Nose/Throat: Hearing grossly intact, nares w/o erythema or drainage, poor dentition Eyes: PER, EOMI, sclera nonicteric.  Neck: Supple, no masses.  No bruit or JVD.  Pulmonary:  Good air movement, clear to auscultation bilaterally, no use of accessory muscles.  Cardiac: RRR, normal S1, S2, no Murmurs. Vascular: scattered varicosities present bilaterally.  Mild venous stasis changes to the legs bilaterally.  2+ soft pitting edema right Vessel Right Left  Radial Palpable Palpable  Gastrointestinal: soft, non-distended. No guarding/no peritoneal signs.  Musculoskeletal: M/S 5/5 both arms and left leg; right leg 3/5.  Atrophy right calf.  Neurologic: CN 2-12 intact. Pain and light touch intact in extremities.  Symmetrical.  Speech is fluent. Motor exam as listed above. Psychiatric: Judgment intact, Mood & affect appropriate for pt's clinical situation. Dermatologic: No rashes or ulcers noted.  No changes consistent with cellulitis. Lymph : No Cervical lymphadenopathy, no lichenification or skin changes of chronic lymphedema.  CBC Lab Results  Component Value Date   WBC 6.4 12/10/2020   HGB 11.6 (L) 12/10/2020   HCT 33.4 (L) 12/10/2020   MCV 88.4 12/10/2020   PLT 370 12/10/2020    BMET    Component Value Date/Time   NA 135 12/10/2020 0548   K  3.5 12/10/2020 0548   CL 98 12/10/2020 0548   CO2 28 12/10/2020 0548   GLUCOSE 111 (H) 12/10/2020 0548   BUN 7 12/10/2020 0548   CREATININE 0.86 12/10/2020 0548   CALCIUM 9.2 12/10/2020 0548   GFRNONAA >60 12/10/2020 0548   CrCl cannot be calculated (Patient's most recent lab result is older than the maximum 21 days allowed.).  COAG No results found for: INR, PROTIME  Radiology DG Ankle Complete Right  Result Date: 01/04/2021 CLINICAL DATA:  Right ankle pain and swelling EXAM: RIGHT ANKLE - COMPLETE 3+ VIEW COMPARISON:  X-ray 12/26/2020 FINDINGS: There is no evidence of fracture, dislocation, or joint effusion. There is no evidence of arthropathy or other focal bone abnormality. Mild diffuse  soft tissue edema. IMPRESSION: No fracture or dislocation of the right ankle. Mild diffuse soft tissue edema. Electronically Signed   By: Duanne Guess D.O.   On: 01/04/2021 14:35   US Venous Img Lower Unilateral Right  Result Date: 01/04/2021 CLINICAL DATA:  30 year old male with a history of pain and edema for 2 weeks EXAM: RIGHT LOWER EXTREMITY VENOUS DOPPLER ULTRASOUND TECHNIQUE: Gray-scale sonography with graded compression, as well as color Doppler and duplex ultrasound were performed to evaluate the lower extremity deep venous systems from the level of the common femoral vein and including the common femoral, femoral, profunda femoral, popliteal and calf veins including the posterior tibial, peroneal and gastrocnemius veins when visible. The superficial great saphenous vein was also interrogated. Spectral Doppler was utilized to evaluate flow at rest and with distal augmentation maneuvers in the common femoral, femoral and popliteal veins. COMPARISON:  None. FINDINGS: Contralateral Common Femoral Vein: Respiratory phasicity is normal and symmetric with the symptomatic side. No evidence of thrombus. Normal compressibility. Common Femoral Vein: No evidence of thrombus. Normal compressibility,  respiratory phasicity and response to augmentation. Saphenofemoral Junction: No evidence of thrombus. Normal compressibility and flow on color Doppler imaging. Profunda Femoral Vein: No evidence of thrombus. Normal compressibility and flow on color Doppler imaging. Femoral Vein: No evidence of thrombus. Normal compressibility, respiratory phasicity and response to augmentation. Popliteal Vein: No evidence of thrombus. Normal compressibility, respiratory phasicity and response to augmentation. Calf Veins: Short segment of posterior tibial vein is noncompressible with thrombus. Peroneal vein patent. No extension into the popliteal vein. Superficial Great Saphenous Vein: No evidence of thrombus. Normal compressibility and flow on color Doppler imaging. Other Findings:  None. IMPRESSION: Positive for short segment DVT of the right posterior tibial vein, without extension into the popliteal vein. Sonographic survey of the right lower extremity is negative for proximal DVT of the common femoral vein, profunda vein, or femoral vein. Negative for DVT of the popliteal vein. Electronically Signed   By: Gilmer Mor D.O.   On: 01/04/2021 15:35   DG Foot Complete Right  Result Date: 12/26/2020 CLINICAL DATA:  Pain following fall EXAM: RIGHT FOOT COMPLETE - 3+ VIEW COMPARISON:  Right foot radiographs November 30, 2020; right foot CT December 08, 2020 FINDINGS: Frontal, oblique, and lateral views were obtained. No fracture or dislocation. Joint spaces appear normal. No erosive change. IMPRESSION: No fracture or dislocation.  No evident arthropathy. Electronically Signed   By: Bretta Bang III M.D.   On: 12/26/2020 14:53     Assessment/Plan 1. Acute deep vein thrombosis (DVT) of tibial vein of right lower extremity (HCC) Recommend:   No surgery or intervention at this point in time.  IVC filter is not indicated at present.  Patient's duplex ultrasound of the venous system shows DVT in the right PT vein.  The patient  is on anticoagulation (Xarelto)  The patient has been on anticoagulation for more than one week, therefore, there are no restrictions to activity.  OK for PT/OT.  Elevation was stressed, use of a recliner was discussed.  I have had a long discussion with the patient regarding DVT and post phlebitic changes such as swelling and why it  causes symptoms such as pain.  The patient will wear graduated compression stockings, beginning after three full days of anticoagulation, on a daily basis a prescription was given. The patient will  beginning wearing the stockings first thing in the morning and removing them in the evening. The patient is instructed specifically not to  sleep in the stockings.  In addition, behavioral modification including elevation during the day and avoidance of prolonged dependency will be initiated.    The patient will continue anticoagulation for now as there have not been any problems or complications at this point.   - VAS [place] LOWER EXTREMITY VENOUS (DVT); Future  2. Injury of right sciatic nerve, sequela This is more likely the cause of his pain.   Levora Dredge, MD  01/17/2021 3:49 PM

## 2021-01-18 ENCOUNTER — Telehealth: Payer: Self-pay | Admitting: *Deleted

## 2021-01-18 NOTE — Telephone Encounter (Signed)
Oral drug swab positive for methamphetamine, fentanyl, and marijuana.

## 2021-01-19 NOTE — Therapy (Signed)
[facility] [facility] MAIN [facility] SERVICES [address] [place], [place], [address] Phone: [phone]   Fax:  [phone]  Speech Language Pathology Treatment  Patient Details  Name: Nathaniel Lynn MRN: [id number] Date of Birth: Mar 11, 1991 Referring Provider (SLP): Delle Reining   Encounter Date: 01/17/2021   End of Session - 01/19/21 2101     Visit Number 5    Number of Visits 25    Date for SLP Re-Evaluation 03/21/21    Authorization Type Private Pay    Authorization Time Period 12/27/2020 thru 03/21/2021    Authorization - Visit Number 5    Progress Note Due on Visit 10    SLP Start Time 1000    SLP Stop Time  1100    SLP Time Calculation (min) 60 min    Activity Tolerance Patient tolerated treatment well             Past Medical History:  Diagnosis Date   DVT (deep venous thrombosis) (HCC)    IVDU (intravenous drug user)     No past surgical history on file.  There were no vitals filed for this visit.   Subjective Assessment - 01/19/21 2059     Subjective "I can't believe my insurance doesn't cover therapy"    Patient is accompained by: Family member    Currently in Pain? No/denies                   ADULT SLP TREATMENT - 01/19/21 0001       Treatment Provided   Treatment provided Cognitive-Linquistic      Cognitive-Linquistic Treatment   Treatment focused on Cognition;Patient/family/caregiver education    Skilled Treatment COGNITION: MEMORY: Mod I recall of another novel semi-complex activity, Mod I with completion, Mod I with recall of upcoming Cardiology appt; basic Sudoku introduced with pt demonstrating moderate ability to follow problem solving sequence              SLP Education - 01/19/21 2101     Education Details progress towards goals    Person(s) Educated Patient;Parent(s)    Methods Explanation;Demonstration;Verbal cues    Comprehension Returned demonstration              SLP Short Term  Goals - 12/27/20 1249       SLP SHORT TERM GOAL #1   Title With moderate cues, pt will demonstrate selective attention to task in a mildly distracting environment for 15 minutes.    Baseline new goal    Time 10    Period --   sessions   Status New      SLP SHORT TERM GOAL #2   Title With moderate cues, pt will demonstrate intellectual awareness by identifying 3 cognitive and 3 physical deficits related to anoxic brain injury.    Baseline new goal    Time 10    Period --   sessions   Status New      SLP SHORT TERM GOAL #3   Title Pt will use compensatory memory strategies to recall orientation information in 9 out of 10 sessions.    Baseline new goal    Time 10    Period --   sessions   Status New      SLP SHORT TERM GOAL #4   Title With moderate cues, pt will complete medication management tasks with 75% accuracy.    Baseline new goal    Time 10    Period --   sessions  Status New      SLP SHORT TERM GOAL #5   Title With moderate cues, pt will complete money management tasks with 75% accuracy.    Baseline new goal    Time 10    Period --   sessions   Status New              SLP Long Term Goals - 12/27/20 1259       SLP LONG TERM GOAL #1   Title With supervision, pt will demonstrate anticipatory awareness by providing safe solution to hypothetical situation.    Baseline new goal    Time 12    Period Weeks    Status New    Target Date 03/21/21      SLP LONG TERM GOAL #2   Title With moderate cues, pt will demonstrate alternating attention in  a mildly distracting environment for 10 minutes.    Baseline new goal    Time 12    Period Weeks    Status New    Target Date 03/21/21      SLP LONG TERM GOAL #3   Title With supervision cues, pt will use compensatory memory strategies to recall important dates (such as doctor's appts) with 75% accuracy.    Baseline new goal    Time 12    Period Weeks    Status New    Target Date 03/21/21      SLP LONG TERM  GOAL #4   Title With supervision cues, pt will complete money and medication management in 7 out of 10 opportunties.    Baseline new goal    Time 12    Period Weeks    Status New    Target Date 03/21/21              Plan - 01/19/21 2102     Clinical Impression Statement Pt presents with improving cognitive communication abilities as evidenced by his Mod I ability to recall information accurately. His mother also reports that he is doing more for himself around the house such as figuring out how to use "[employer]." while pt has new Medicaid coverage, his policy doesn't pay for therapy services, therefore he remains private pay. Given this, recommend reduce pt to 1 x week for the immediate future. Skilled ST services continue to be required to target pt's mild to moderate cognitive impairments to increase pt's functional independence, return to work and decrease caregiver burden.    Speech Therapy Frequency 2x / week    Duration 12 weeks    Treatment/Interventions Compensatory strategies;Cueing hierarchy;Functional tasks;Patient/family education;Cognitive reorganization;SLP instruction and feedback;Internal/external aids    Potential to Achieve Goals Good    Potential Considerations Previous level of function;Co-morbidities;Severity of impairments;Other (comment)    SLP Home Exercise Plan provided, see pt. instructions section    Consulted and Agree with Plan of Care Patient;Family member/caregiver    Family Member Consulted pt's mother             Patient will benefit from skilled therapeutic intervention in order to improve the following deficits and impairments:   Cognitive communication deficit  Anoxic brain injury [facility])    Problem List Patient Active Problem List   Diagnosis Date Noted   DVT (deep venous thrombosis) (HCC) 01/17/2021   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   Constipation 12/21/2020   Acute blood loss anemia 12/21/2020   Dysesthesia--RLE 12/21/2020    Anoxic brain injury (HCC) 12/07/2020   Pressure injury of skin 11/21/2020  AKI (acute kidney injury) (HCC)    Aspiration pneumonia (HCC)    Hypernatremia    Acute respiratory failure with hypoxemia (HCC)    Encephalopathy    Status epilepticus (HCC) 11/10/2020   Alayzia Pavlock B. Dreama Saa M.S., CCC-SLP, [facility] Speech-Language Pathologist Rehabilitation Services Office [phone]  Reuel Derby 01/19/2021, 9:03 PM  [facility] MAIN [facility] SERVICES [address] [place], [place], [address] Phone: [phone]   Fax:  [phone]   Name: Nathaniel Lynn MRN: [id number] Date of Birth: 1990-10-17

## 2021-01-19 NOTE — Patient Instructions (Signed)
Complete Sudoku puzzles

## 2021-01-22 ENCOUNTER — Ambulatory Visit: Payer: Medicaid Other | Admitting: Speech Pathology

## 2021-01-23 ENCOUNTER — Encounter: Payer: Self-pay | Admitting: Occupational Therapy

## 2021-01-24 ENCOUNTER — Ambulatory Visit: Payer: Medicaid Other | Admitting: Speech Pathology

## 2021-01-24 ENCOUNTER — Encounter: Payer: Self-pay | Admitting: Occupational Therapy

## 2021-01-29 ENCOUNTER — Ambulatory Visit: Payer: Self-pay | Attending: Physical Medicine and Rehabilitation | Admitting: Physical Therapy

## 2021-01-29 ENCOUNTER — Other Ambulatory Visit: Payer: Self-pay

## 2021-01-29 ENCOUNTER — Encounter: Payer: Self-pay | Admitting: Physical Therapy

## 2021-01-29 DIAGNOSIS — R262 Difficulty in walking, not elsewhere classified: Secondary | ICD-10-CM | POA: Insufficient documentation

## 2021-01-29 DIAGNOSIS — R2681 Unsteadiness on feet: Secondary | ICD-10-CM | POA: Insufficient documentation

## 2021-01-29 DIAGNOSIS — R41841 Cognitive communication deficit: Secondary | ICD-10-CM | POA: Insufficient documentation

## 2021-01-29 DIAGNOSIS — R278 Other lack of coordination: Secondary | ICD-10-CM | POA: Insufficient documentation

## 2021-01-29 DIAGNOSIS — G931 Anoxic brain damage, not elsewhere classified: Secondary | ICD-10-CM | POA: Insufficient documentation

## 2021-01-29 DIAGNOSIS — M6281 Muscle weakness (generalized): Secondary | ICD-10-CM | POA: Insufficient documentation

## 2021-01-29 DIAGNOSIS — M25571 Pain in right ankle and joints of right foot: Secondary | ICD-10-CM | POA: Insufficient documentation

## 2021-01-29 DIAGNOSIS — R2689 Other abnormalities of gait and mobility: Secondary | ICD-10-CM | POA: Insufficient documentation

## 2021-01-29 NOTE — Therapy (Signed)
[place] [facility] MAIN [facility] SERVICES [address] [place], [place], [address] Phone: [phone]   Fax:  [phone]  Physical Therapy Evaluation  Patient Details  Name: KAMERAN MCNEESE MRN: [id number] Date of Birth: 27-Dec-1990 Referring Provider (PT): Delle Reining [place]   Encounter Date: 01/29/2021   PT End of Session - 01/29/21 1340     Visit Number 1    Number of Visits 17    Date for PT Re-Evaluation 03/26/21    Authorization Type Self pay, awaiting Medicaid approval    PT Start Time 1106    PT Stop Time 1200    PT Time Calculation (min) 54 min             Past Medical History:  Diagnosis Date   DVT (deep venous thrombosis) (HCC)    IVDU (intravenous drug user)     History reviewed. No pertinent surgical history.  There were no vitals filed for this visit.    Subjective Assessment - 01/29/21 1111     Subjective "I fell last week and I think that I hurt my right foot/ankle again."    Patient is accompained by: Family member   Sister in Social worker, Marchelle Folks   Pertinent History 30 yo Male was found unresponsive on 5/14 due to drug abuse. Patient was intubated and monitored with EEG. He was extubated on 5/25. He was discharged to inpatient rehab on 6/10 where he received PT/OT and ST. Patient was discharged hom on 6/25. He reports since being home he has not received any physical therapy services and has  not been doing a formal exercise program. He was instructed to use a RW for ambulation, however patient prefers to use BUE axillary crutches. He reports multiple falls. On 6/29 he came to ED with right foot/ankle pain. He was diagnosed with a sprain and given a CAM boot. Patient states the doctor said to wear the boot as needed, but he doesn't wear it because of pain. On 7/8 he came back to ED with RLE pain and was found to have a DVT. He was given oral blood thinners which he is still taking. Patient has boot for RLE which he prefers not to wear. He is  mostly self NWB due to pain.  He fell a week ago and stumbled on his right foot and is concerned he messed his right foot up even more. The rubber on the bottom of his crutches wore away and they slipped and he fell. Marland Kitchen He is not currently following up with orthopedic for RLE foot/ankle due to waiting on insurance approval/authorization. He is currently self pay for everything. Sister in law present at evaluation and states that he only has 5 blood thinner pills left. He is also out of pain medication (Lyrica) which he states he has been without for last week which has contributed to increased pain;   He does report numbness/tingling in RLE lower half of foot. He reports pain is localized to foot/ankle.   Patient presents to therapy with flip flop on LLE; He reports he usually wears tennis shoes on LLE but prefers to be barefoot on RLE. He reports even wearing a sock on right foot is painful. He will massage and elevate his right foot which does help some. PMH signifcant for acute DVT in RLE, on blood thinners; smoker, past drug abuse;    Limitations Standing;Walking    How long can you sit comfortably? about an hour    How long can  you stand comfortably? <5 min    How long can you walk comfortably? about 20 feet without AD; can go a few hundred feet with crutches    Diagnostic tests X-rays of right foot/ankle 6/28 and 7/8 show no fracture, mild swelling; Hasn't been back to MD since last fall ; MRI 5/24: Significantly motion degraded study. Most of the previously seen  restricted diffusion in the bilateral parietal cortical and  subcortical brain is no longer appreciable. 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 No new or progressive  finding.    Patient Stated Goals to return to work;    Currently in Pain? Yes    Pain Score 7     Pain  Location Foot    Pain Orientation Right    Pain Descriptors / Indicators Throbbing;Pins and needles    Pain Type Chronic pain    Pain Radiating Towards RLE foot/ankle    Pain Onset More than a month ago    Pain Frequency Intermittent    Aggravating Factors  movement, rest in dependent position    Pain Relieving Factors better with elevation;    Effect of Pain on Daily Activities decreased activity tolerance;    Multiple Pain Sites No                [facility] PT Assessment - 01/29/21 0001       Assessment   Medical Diagnosis Anoxic Brain Injury    Referring Provider (PT) Delle Reining PA    Onset Date/Surgical Date 11/10/20    Hand Dominance Right    Next MD Visit --   none scheduled   Prior Therapy Had Inpatient Rehab 6/10-6/25, DC home on 6/25; referred to outpatient. Did not have home health PT; no history of outpatient PT for this condition      Precautions   Precautions Fall    Required Braces or Orthoses --   has boot for RLE, does not like to wear it; per patient MD states wear boot PRN     Restrictions   Weight Bearing Restrictions No      Balance Screen   Has the patient fallen in the past 6 months Yes    How many times? 3+   in the last month   Has the patient had a decrease in activity level because of a fear of falling?  Yes    Is the patient reluctant to leave their home because of a fear of falling?  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; hoping to return; needs to be able to climb a ladder; needs to lift 75#   Leisure likes to fish, hang out with family      Observation/Other Assessments   Observations pt presents with RLE  barefoot, flip flop on LLE    Focus on Therapeutic Outcomes (FOTO)  46%      Sensation   Light Touch Appears Intact   reports numbness in RLE ankle  but able to feel pain     Coordination   Gross Motor Movements are Fluid and Coordinated Yes   decreased RLE foot/ankle movement   Fine Motor Movements are Fluid and Coordinated Yes      Posture/Postural Control   Posture Comments Spine is WFL; When sitting or lying down, patient rests with RLE rotated into ER and foot relaxed into PF; unable to achieve neutral position of RLE ankle, unable to hold hip into neutral position with significant tightness in hip IR      ROM / Strength   AROM / PROM / Strength AROM;Strength      AROM   Overall AROM Comments RLE ankle: DF; 0, PF: 0, IV: 15, EV: 10   Patient unable to actively move RLE ankle into DF/PF     Strength   Strength Assessment Site Hip;Knee;Ankle    Right/Left Hip Right;Left    Right Hip Flexion 4/5    Right Hip ABduction 3/5    Right Hip ADduction 3/5    Left Hip Flexion 3+/5    Left Hip ABduction 3/5    Left Hip ADduction 3/5    Right/Left Knee Right;Left    Right Knee Flexion 3/5    Right Knee Extension 3/5    Left Knee Flexion 5/5    Left Knee Extension 5/5    Right/Left Ankle Right;Left    Left Ankle Dorsiflexion 5/5      Palpation   Palpation comment has moderate to severe tenderness to right foot/ankle along medial/lateral ankle; pain with great toe extension along plantar surface of foot with pain to palpation of plantar fascia;      Special Tests    Special Tests Ankle/Foot Special Tests    Ankle/Foot Special Tests  Anterior Drawer Test      Anterior Drawer Test   Findings --    Side  Right    Comments unable to tolerate testing due to pain      Transfers   Comments requires supervision and increased time with sit<>Stand transfers, RLE is NWB      Ambulation/Gait   Gait Comments Pt ambulates with BUE axillary crutches, RLE NWB, with hop to gait pattern,  slower gait speed, mild unsteadiness noted;Limited to short distances; Unable to assess weight bearing in RLE as patient presents without shoes and without boot for RLE      Standardized Balance Assessment   Standardized Balance Assessment Five Times Sit to Stand    Five times sit to stand comments  15.85 sec without pushing on chair, minimal weight on RLE, heavy lean to LLE   high risk for falls     High Level Balance   High Level Balance Comments patient is mostly SLS on LLE due to RLE pain;   Requires CGA to close supervision for safety in SLS                       Objective measurements completed on examination: See above findings.               PT Education - 01/29/21 1322     Education Details Recommendations/plan of care    Person(s) Educated Patient    Methods Explanation    Comprehension Verbalized understanding              PT Short Term Goals - 01/30/21 1421       PT SHORT TERM GOAL #1   Title Patient will wear tennis shoe  or other supportive shoe with a solid back on LLE foot at least 90% of the time to improve safety with ambulation and mobility    Baseline 8/2: often wears flip flop/slide shoes    Time 4    Period Weeks    Status New    Target Date 02/27/21      PT SHORT TERM GOAL #2   Title Patient will improve RLE ankle PROM to neutral to prepare foot for standing/weight bearing tasks.    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    Baseline 8/2: reports 3+ falls in last month    Time 4    Period Weeks    Status New    Target Date 02/27/21      PT SHORT TERM GOAL #4   Title Patient will be adherent to HEP at least 3x a week to improve ROM/strength and mobility;    Baseline 8/2: not doing HEP    Time 4    Period  Weeks    Status New    Target Date 02/27/21               PT Long Term Goals - 01/30/21 1423       PT LONG TERM GOAL #1   Title Patient will be able to complete 5 times sit<>Stand, <15 sec,  with both feet on floor to improve safety awareness and transfer ability.    Baseline 8/2: 15.85 sec, LLE only;    Time 8    Period Weeks    Status New    Target Date 03/27/21      PT LONG TERM GOAL #2   Title Patient will improve FOTO score to >50% to indicate improved functional mobility with ADLs.    Baseline 8/2: 46%    Time 8    Period Weeks    Status New    Target Date 03/27/21      PT LONG TERM GOAL #3   Title Patient will improve BLE gross strength to at least 4/5 in hip/knee to indicate improved functional strength for ADLs.    Baseline 8/2: see flowsheet    Time 8    Period Weeks    Status New    Target Date 03/27/21      PT LONG TERM GOAL #4   Title Patient will be able to ambulate at least 50 feet with LRAD bearing weight through BLE for improved safety with ambulation and to increase home ambulation mobility.    Baseline 8/2: uses crutches and self NWB on RLE.    Time 8    Period Weeks    Status New    Target Date 03/27/21      PT LONG TERM GOAL #5   Title Patient will report a worst pain of 6/10 in right foot/ankle to indicate improved tolerance with gait and mobility;    Baseline 8/2: 7+/10 RLE ankle pain;    Time 8    Period Weeks    Status New    Target Date 03/27/21                        Plan -  01/29/21 1343     Clinical Impression Statement 30 yo Male presents to therapy with RLE foot/ankle pain and weakness following anoxic brain injury on 11/10/20. Patient was intubated and then extubated on 5/24. He did receive Inpatient Rehab for PT/OT and ST 6/10-6/25. He was discharged home and reports continued RLE pain. Patient is currently ambulating with axillary crutches despite being recommended to use RW. He refuses to use RW as he, "doesn't  want to be perceived as old". Patient self limits gait with RLE being NWB due to pain. He has had multiple falls with crutches and reports multiple injuries to RLE foot/ankle. He was in ED on 6/29 with acute right ankle pain and was diagnosed with sprain. He was given CAM boot which he does not wear as he states its painful. On 7/8 patient was back in ED with RLE swelling/pain and diagnosed with acute DVT. Patient was started on Xarelto and discharged home. He was recommended to wear compression stockings which according to patient he is not wearing. Concerned about poor medical management as patient is self pay and expressed financial contraints running out of some meds and only having 5 blood thinner pills left. Also he has not been wearing stockings or CAM boot. Patient is unsure of precautions/restrictions with right foot and ankle and states he is not following up with orthopedic MD due to financial constraints. He is still waiting on medicaid coverage. Recommend he follow up with vascular surgeon regarding medication as well as PCP for possible orthopedic referral to assess right foot/ankle. Patient has multiple falls on foot/ankle and is currently NWB due to pain. He has a PMH significant for drug abuse and exhibits poor insight/safety awareness. Concerned with high levels of chronic pain to right foot/ankle as well as swelling/redness that he is at risk for developing CRPS. He does exhibit weakness in BLE and overall limited mobility and balance. Would benefit from skilled PT trial due to concerns of non compliance with poor medical management.  He would benefit from further balance assessment as evaluation time limited. He is currently getting Speech Therapy.    Personal Factors and Comorbidities Comorbidity 3+;Finances;Social Background;Behavior Pattern    Comorbidities DVT RLE, recent falls with right ankle injury, drug abuse, RLE Sciatic pain, brain injury    Examination-Activity Limitations Caring  for Others;Locomotion Level;Squat;Stairs;Stand;Transfers    Examination-Participation Restrictions Cleaning;Community Activity;Driving;Occupation;Shop;Yard Work    Conservation officer, historic buildingstability/Clinical Decision Making Unstable/Unpredictable    Visual merchandiserClinical Decision Making High    Rehab Potential Fair    PT Frequency 2x / week    PT Duration 8 weeks    PT Treatment/Interventions ADLs/Self Care Home Management;Cryotherapy;Moist Heat;Gait training;Stair training;Functional mobility training;Therapeutic activities;Therapeutic exercise;Balance training;Neuromuscular re-education;Patient/family education;Orthotic Fit/Training;Manual techniques;Passive range of motion;Energy conservation    PT Next Visit Plan balance formally assessed, assessed right ankle contracture, initiate stretching program    PT Home Exercise Plan discussed stretching, will provide more formal program next session;    Recommended Other Services currently getting ST;    Consulted and Agree with Plan of Care Patient             Patient will benefit from skilled therapeutic intervention in order to improve the following deficits and impairments:     Visit Diagnosis: Muscle weakness (generalized)  Pain in right ankle and joints of right foot  Difficulty in walking, not elsewhere classified  Unsteadiness on feet     Problem List Patient Active Problem List   Diagnosis Date Noted   DVT (deep venous thrombosis) (HCC)  01/17/2021   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 [place], [place], [address] Phone: [phone]   Fax:  [phone]  Name: EDRIS FRIEDT MRN: [id number] Date of Birth: [birth year]

## 2021-01-31 ENCOUNTER — Telehealth (INDEPENDENT_AMBULATORY_CARE_PROVIDER_SITE_OTHER): Payer: Self-pay

## 2021-01-31 ENCOUNTER — Ambulatory Visit: Payer: Self-pay

## 2021-01-31 ENCOUNTER — Ambulatory Visit: Payer: Self-pay | Admitting: Speech Pathology

## 2021-01-31 ENCOUNTER — Telehealth: Payer: Self-pay | Admitting: Speech Pathology

## 2021-01-31 ENCOUNTER — Other Ambulatory Visit: Payer: Self-pay

## 2021-01-31 DIAGNOSIS — M25571 Pain in right ankle and joints of right foot: Secondary | ICD-10-CM

## 2021-01-31 DIAGNOSIS — R262 Difficulty in walking, not elsewhere classified: Secondary | ICD-10-CM

## 2021-01-31 DIAGNOSIS — M6281 Muscle weakness (generalized): Secondary | ICD-10-CM

## 2021-01-31 DIAGNOSIS — R2681 Unsteadiness on feet: Secondary | ICD-10-CM

## 2021-01-31 NOTE — Telephone Encounter (Signed)
Pt's mom called and left a VM on the nurses line  saying he only has 3 pill left from his starter pack of xarelto and wants to know could be be switched to coumadin instead due to the cost pt does not have insurance. Please advise.

## 2021-01-31 NOTE — Therapy (Signed)
[facility] MAIN [facility] SERVICES [address] [place], [place], [address] Phone: [phone]   Fax:  [phone]  Physical Therapy Treatment  Patient Details  Name: CARLOSDANIEL GROB MRN: [id number] Date of Birth: 02/08/1991 Referring Provider (PT): Delle Reining [place]   Encounter Date: 01/31/2021   PT End of Session - 01/31/21 1240     Visit Number 2    Number of Visits 17    Date for PT Re-Evaluation 03/26/21    Authorization Type Self pay, awaiting Medicaid approval    PT Start Time 0857    PT Stop Time 0930    PT Time Calculation (min) 33 min    Equipment Utilized During Treatment Gait belt    Activity Tolerance Patient limited by pain    Behavior During Therapy WFL for tasks assessed/performed             Past Medical History:  Diagnosis Date   DVT (deep venous thrombosis) (HCC)    IVDU (intravenous drug user)     History reviewed. No pertinent surgical history.  There were no vitals filed for this visit.   Subjective Assessment - 01/31/21 1240     Subjective Patient presents with sister in law, Is ~15 minutes late to session.    Patient is accompained by: Family member   Sister in Social worker, Marchelle Folks   Pertinent History 30 yo Male was found unresponsive on 5/14 due to drug abuse. Patient was intubated and monitored with EEG. He was extubated on 5/25. He was discharged to inpatient rehab on 6/10 where he received PT/OT and ST. Patient was discharged hom on 6/25. He reports since being home he has not received any physical therapy services and has  not been doing a formal exercise program. He was instructed to use a RW for ambulation, however patient prefers to use BUE axillary crutches. He reports multiple falls. On 6/29 he came to ED with right foot/ankle pain. He was diagnosed with a sprain and given a CAM boot. Patient states the doctor said to wear the boot as needed, but he doesn't wear it because of pain. On 7/8 he came back to ED with RLE  pain and was found to have a DVT. He was given oral blood thinners which he is still taking. Patient has boot for RLE which he prefers not to wear. He is mostly self NWB due to pain.  He fell a week ago and stumbled on his right foot and is concerned he messed his right foot up even more. The rubber on the bottom of his crutches wore away and they slipped and he fell. Marland Kitchen He is not currently following up with orthopedic for RLE foot/ankle due to waiting on insurance approval/authorization. He is currently self pay for everything. Sister in law present at evaluation and states that he only has 5 blood thinner pills left. He is also out of pain medication (Lyrica) which he states he has been without for last week which has contributed to increased pain;   He does report numbness/tingling in RLE lower half of foot. He reports pain is localized to foot/ankle.   Patient presents to therapy with flip flop on LLE; He reports he usually wears tennis shoes on LLE but prefers to be barefoot on RLE. He reports even wearing a sock on right foot is painful. He will massage and elevate his right foot which does help some. PMH signifcant for acute DVT in RLE, on blood thinners; smoker, past  drug abuse;    Limitations Standing;Walking    How long can you sit comfortably? about an hour    How long can you stand comfortably? <5 min    How long can you walk comfortably? about 20 feet without AD; can go a few hundred feet with crutches    Diagnostic tests X-rays of right foot/ankle 6/28 and 7/8 show no fracture, mild swelling; Hasn't been back to MD since last fall ; MRI 5/24: Significantly motion degraded study. Most of the previously seen  restricted diffusion in the bilateral parietal cortical and  subcortical brain is no longer appreciable. 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 No new or progressive  finding.    Patient Stated Goals to return to work;    Currently in Pain? Yes    Pain Score 7     Pain Location Foot    Pain Orientation Right    Pain Descriptors / Indicators Throbbing    Pain Type Chronic pain    Pain Onset More than a month ago    Pain Frequency Intermittent                   Treatment:  Education on desensitization training with wash cloth. Performed on patient on RLE for 1 min, patient demonstrates understanding on self performance x 2 minutes    Supine : Contract relax against PT resistance from knee flexion/extension with plantarflexion into PT shoulder 12x 10 second holds Internal/external of RLE with AAROM for IR 15x Hip abduction stretch with hip flexion 60 second with PT overpressure Isometric contraction into PT hand; 10 x 3 seconds with cues for muscle activation sequencing -plantarflexion, dorsiflexion, eversion, inversion.  Bridge with stabilization provided to RLE 12x ; cue for keeping UE crossed for optimal gluteal activation RLE bridge with LLE straight (modified) arms flat on table 10x;  March with pressure into foot upon return to mat 12x each LE  Seated: Dynadisc hamstring isometric contraction pressing into disc 12x 3 second holds Weight shift pressing into RLE 10x    Education provided throughout session via VC/TC and demonstration to facilitate movement at target joints and correct muscle activation for all exercises performed. Pt with good carryover of technique within session.  Session is limited by late arrival of patient. Patient is able to tolerate weight acceptance onto RLE in supine and seated. Education on desensitization training performed with patient demonstrating understanding of technique with wash cloth. Patient is able to perform isometric ankle intervention well with dorsiflexion and inversion being most inhibited at this time. Patient did not wear proper shoewear this session  so unable to assess balance this session. Patient will benefit from skilled physical therapy to reduce pain, improve strength, improve mobility, and decrease fall risk.                 PT Education - 01/31/21 1240     Education Details exercise technique, body mechanics    Person(s) Educated Patient    Methods Explanation;Demonstration;Tactile cues;Verbal cues    Comprehension Verbalized understanding;Returned demonstration;Verbal cues required;Tactile cues required              PT Short Term Goals - 01/30/21 1421       PT SHORT TERM GOAL #1   Title Patient will wear tennis shoe or other supportive shoe with a solid  back on LLE foot at least 90% of the time to improve safety with ambulation and mobility    Baseline 8/2: often wears flip flop/slide shoes    Time 4    Period Weeks    Status New    Target Date 02/27/21      PT SHORT TERM GOAL #2   Title Patient will improve RLE ankle PROM to neutral to prepare foot for standing/weight bearing tasks.    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    Baseline 8/2: reports 3+ falls in last month    Time 4    Period Weeks    Status New    Target Date 02/27/21      PT SHORT TERM GOAL #4   Title Patient will be adherent to HEP at least 3x a week to improve ROM/strength and mobility;    Baseline 8/2: not doing HEP    Time 4    Period Weeks    Status New    Target Date 02/27/21               PT Long Term Goals - 01/30/21 1423       PT LONG TERM GOAL #1   Title Patient will be able to complete 5 times sit<>Stand, <15 sec,  with both feet on floor to improve safety awareness and transfer ability.    Baseline 8/2: 15.85 sec, LLE only;    Time 8    Period Weeks    Status New    Target Date  03/27/21      PT LONG TERM GOAL #2   Title Patient will improve FOTO score to >50% to indicate improved functional mobility with ADLs.    Baseline 8/2: 46%    Time 8    Period Weeks    Status New    Target Date 03/27/21      PT LONG TERM GOAL #3   Title Patient will improve BLE gross strength to at least 4/5 in hip/knee to indicate improved functional strength for ADLs.    Baseline 8/2: see flowsheet    Time 8    Period Weeks    Status New    Target Date 03/27/21      PT LONG TERM GOAL #4   Title Patient will be able to ambulate at least 50 feet with LRAD bearing weight through BLE for improved safety with ambulation and to increase home ambulation mobility.    Baseline 8/2: uses crutches and self NWB on RLE.    Time 8    Period Weeks    Status New    Target Date 03/27/21      PT LONG TERM GOAL #5   Title Patient will report a worst pain of 6/10 in right foot/ankle to indicate improved tolerance with gait and mobility;    Baseline 8/2: 7+/10 RLE ankle pain;    Time 8    Period Weeks    Status New    Target Date 03/27/21                   Plan - 01/31/21 1243     Clinical Impression Statement Session is  limited by late arrival of patient. Patient is able to tolerate weight acceptance onto RLE in supine and seated. Education on desensitization training performed with patient demonstrating understanding of technique with wash cloth. Patient is able to perform isometric ankle intervention well with dorsiflexion and inversion being most inhibited at this time. Patient did not wear proper shoewear this session so unable to assess balance this session. Patient will benefit from skilled physical therapy to reduce pain, improve strength, improve mobility, and decrease fall risk.    Personal Factors and Comorbidities Comorbidity 3+;Finances;Social Background;Behavior Pattern    Comorbidities DVT RLE, recent falls with right ankle injury, drug abuse, RLE Sciatic pain, brain  injury    Examination-Activity Limitations Caring for Others;Locomotion Level;Squat;Stairs;Stand;Transfers    Examination-Participation Restrictions Cleaning;Community Activity;Driving;Occupation;Shop;Yard Work    Stability/Clinical Decision Making Unstable/Unpredictable    Rehab Potential Fair    PT Frequency 2x / week    PT Duration 8 weeks    PT Treatment/Interventions ADLs/Self Care Home Management;Cryotherapy;Moist Heat;Gait training;Stair training;Functional mobility training;Therapeutic activities;Therapeutic exercise;Balance training;Neuromuscular re-education;Patient/family education;Orthotic Fit/Training;Manual techniques;Passive range of motion;Energy conservation    PT Next Visit Plan balance formally assessed, assessed right ankle contracture, initiate stretching program    PT Home Exercise Plan discussed stretching, will provide more formal program next session;    Consulted and Agree with Plan of Care Patient             Patient will benefit from skilled therapeutic intervention in order to improve the following deficits and impairments:  Abnormal gait, Decreased balance, Decreased endurance, Difficulty walking, Impaired perceived functional ability, Increased edema, Decreased range of motion, Decreased activity tolerance, Decreased safety awareness, Decreased knowledge of use of DME, Decreased strength, Pain  Visit Diagnosis: Muscle weakness (generalized)  Pain in right ankle and joints of right foot  Difficulty in walking, not elsewhere classified  Unsteadiness on feet     Problem List Patient Active Problem List   Diagnosis Date Noted   DVT (deep venous thrombosis) (HCC) 01/17/2021   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   Constipation 12/21/2020   Acute blood loss anemia 12/21/2020   Dysesthesia--RLE 12/21/2020   Anoxic brain injury (HCC) 12/07/2020   Pressure injury of skin 11/21/2020   AKI (acute kidney injury) (HCC)    Aspiration pneumonia (HCC)     Hypernatremia    Acute respiratory failure with hypoxemia (HCC)    Encephalopathy    Status epilepticus (HCC) 11/10/2020    Precious BardMarina Regine Christian, PT, DPT  01/31/2021, 12:43 PM  [place] [facility] [address] [place], [place], [address] Phone: [phone]   Fax:  [phone]  Name: Nelda SevereLacy R Quilling MRN: [id number] Date of Birth: 01/25/1991

## 2021-01-31 NOTE — Telephone Encounter (Signed)
Pt attended scheduled PT session this morning. ST was scheduled following PT. Per receptionist, pt left without giving a reason prior to ST session.   I left a voice message on the following numbers Haaris Metallo [phone] and [phone] Carol's (pt's mother) [phone]  Pt has been accompanied to his most recent sessions by his sister-in-law, Marchelle Folks.   Pt's brother Tomma Lightning) is listed as a contact in pt's chart. I called and spoke with Tomma Lightning. Pt is now residing with him and his wife. Brother was unaware of pt leaving prior to scheduled ST today. I informed him that pt has had several cancellations without any reason given and now with pt leaving today with no reason given. At this time pt is appropriate for discharge from ST sessions. His brother was apologetic and stated he would have Jaymeson call me.    Awaiting call from pt.   Viviane Semidey B. Dreama Saa M.S., CCC-SLP, [facility] Speech-Language Pathologist Rehabilitation Services Office [phone]

## 2021-02-01 NOTE — Telephone Encounter (Signed)
Pt's mom called stating that patient is very low on his starter pack of Xarelto. FB spoke to patients mom to inform her that the coumadin alternative can be a tricky due to patient having to watch what they eat/drink along with weekly labs to test levels. FB advised patient to try 1 month of Eliquis samples and we can see how that works. Patients' mother agreed and will be picking up samples today in office.     This note is for documentation purposes only.

## 2021-02-05 ENCOUNTER — Ambulatory Visit: Payer: Self-pay

## 2021-02-05 ENCOUNTER — Other Ambulatory Visit: Payer: Self-pay

## 2021-02-05 ENCOUNTER — Ambulatory Visit: Payer: Self-pay | Admitting: Speech Pathology

## 2021-02-05 DIAGNOSIS — G931 Anoxic brain damage, not elsewhere classified: Secondary | ICD-10-CM

## 2021-02-05 DIAGNOSIS — R41841 Cognitive communication deficit: Secondary | ICD-10-CM

## 2021-02-06 NOTE — Therapy (Signed)
[facility] MAIN [facility] SERVICES [address] [place], [place], [address] Phone: [phone]   Fax:  [phone]  Speech Language Pathology Treatment DISCHARGE SUMMARY  Patient Details  Name: PIERRE DELLAROCCO MRN: [id number] Date of Birth: 1990-08-04 Referring Provider (SLP): Delle Reining   Encounter Date: 02/05/2021   End of Session - 02/06/21 1357     Visit Number 6    Authorization Type Private Pay    SLP Start Time 1410    SLP Stop Time  1500    SLP Time Calculation (min) 50 min    Activity Tolerance Patient tolerated treatment well             Past Medical History:  Diagnosis Date   DVT (deep venous thrombosis) (HCC)    IVDU (intravenous drug user)     No past surgical history on file.  There were no vitals filed for this visit.   Subjective Assessment - 02/06/21 1353     Subjective "ready to start PT to get this foot working (pointing to his right foot)"    Patient is accompained by: Family member    Currently in Pain? No/denies                   ADULT SLP TREATMENT - 02/06/21 0001       Treatment Provided   Treatment provided Cognitive-Linquistic      Cognitive-Linquistic Treatment   Treatment focused on Cognition;Patient/family/caregiver education    Skilled Treatment COGNITION: Mod I with semi-complex and complex reasoning tasks/activities              SLP Education - 02/06/21 1357     Education Details completed    Person(s) Educated Patient;Parent(s)    Methods Explanation;Demonstration;Verbal cues    Comprehension Verbalized understanding;Returned demonstration              SLP Short Term Goals - 02/06/21 1401       SLP SHORT TERM GOAL #1   Title With moderate cues, pt will demonstrate selective attention to task in a mildly distracting environment for 15 minutes.    Status Achieved      SLP SHORT TERM GOAL #2   Title With moderate cues, pt will demonstrate intellectual awareness  by identifying 3 cognitive and 3 physical deficits related to anoxic brain injury.    Status Achieved      SLP SHORT TERM GOAL #3   Title Pt will use compensatory memory strategies to recall orientation information in 9 out of 10 sessions.    Status Achieved      SLP SHORT TERM GOAL #4   Title With moderate cues, pt will complete medication management tasks with 75% accuracy.    Status Achieved      SLP SHORT TERM GOAL #5   Title With moderate cues, pt will complete money management tasks with 75% accuracy.    Status Achieved              SLP Long Term Goals - 02/06/21 1403       SLP LONG TERM GOAL #1   Title With supervision, pt will demonstrate anticipatory awareness by providing safe solution to hypothetical situation.    Status Achieved      SLP LONG TERM GOAL #2   Title With moderate cues, pt will demonstrate alternating attention in  a mildly distracting environment for 10 minutes.    Status Achieved      SLP LONG TERM GOAL #3  Title With supervision cues, pt will use compensatory memory strategies to recall important dates (such as doctor's appts) with 75% accuracy.    Status Achieved      SLP LONG TERM GOAL #4   Title With supervision cues, pt will complete money and medication management in 7 out of 10 opportunties.    Status Achieved              Plan - 02/06/21 1358     Clinical Impression Statement Pt continues to make great progress towards his STG and LTGs targeting cognitive deficits. Per his report with his mother confirming, pt is currently at his baseline cognitive function. All education has been completed and pt is appropriate for discharge from ST services at this time.    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 Dreama Saa M.S., CCC-SLP, [facility] Speech-Language Pathologist Rehabilitation Services Office [phone]  Reuel Derby 02/06/2021, 2:05 PM  [facility] MAIN [facility] SERVICES [address] [place], [place], [address] Phone: [phone]   Fax:  [phone]   Name: IRA DOUGHER MRN: [id number] Date of Birth: 22-Mar-1991

## 2021-02-07 ENCOUNTER — Encounter: Payer: Self-pay | Admitting: Speech Pathology

## 2021-02-07 ENCOUNTER — Ambulatory Visit: Payer: Self-pay

## 2021-02-07 ENCOUNTER — Other Ambulatory Visit: Payer: Self-pay

## 2021-02-07 DIAGNOSIS — R278 Other lack of coordination: Secondary | ICD-10-CM

## 2021-02-07 DIAGNOSIS — M25571 Pain in right ankle and joints of right foot: Secondary | ICD-10-CM

## 2021-02-07 DIAGNOSIS — R2689 Other abnormalities of gait and mobility: Secondary | ICD-10-CM

## 2021-02-07 DIAGNOSIS — R2681 Unsteadiness on feet: Secondary | ICD-10-CM

## 2021-02-07 DIAGNOSIS — M6281 Muscle weakness (generalized): Secondary | ICD-10-CM

## 2021-02-07 NOTE — Therapy (Signed)
[facility] MAIN [facility] SERVICES [address] [place], [place], [address] Phone: [phone]   Fax:  [phone]  Physical Therapy Treatment  Patient Details  Name: Nathaniel Lynn MRN: [id number] Date of Birth: [birth year] Referring Provider (PT): Delle Reining [place]   Encounter Date: 02/07/2021   PT End of Session - 02/07/21 1012     Visit Number 3    Number of Visits 17    Date for PT Re-Evaluation 03/26/21    Authorization Type Self pay, awaiting Medicaid approval    PT Start Time 0917    PT Stop Time 1001    PT Time Calculation (min) 44 min    Equipment Utilized During Treatment Gait belt    Activity Tolerance Patient limited by pain    Behavior During Therapy WFL for tasks assessed/performed             Past Medical History:  Diagnosis Date   DVT (deep venous thrombosis) (HCC)    IVDU (intravenous drug user)     History reviewed. No pertinent surgical history.  There were no vitals filed for this visit.   Subjective Assessment - 02/07/21 1010     Subjective Pt presents with his mother to PT session. Pt using crutches and wearing shoes on both LEs reporting improvement in tolerance to touch on RLE. He reports he has been performing HEP with use of washcloth for desensitization of R foot. He reports it has helped a lot. Pt reports continued swelling of R foot.    Patient is accompained by: Family member   Sister in Social worker, Marchelle Folks   Pertinent History 30 yo Male was found unresponsive on 5/14 due to drug abuse. Patient was intubated and monitored with EEG. He was extubated on 5/25. He was discharged to inpatient rehab on 6/10 where he received PT/OT and ST. Patient was discharged hom on 6/25. He reports since being home he has not received any physical therapy services and has  not been doing a formal exercise program. He was instructed to use a RW for ambulation, however patient prefers to use BUE axillary crutches. He reports multiple  falls. On 6/29 he came to ED with right foot/ankle pain. He was diagnosed with a sprain and given a CAM boot. Patient states the doctor said to wear the boot as needed, but he doesn't wear it because of pain. On 7/8 he came back to ED with RLE pain and was found to have a DVT. He was given oral blood thinners which he is still taking. Patient has boot for RLE which he prefers not to wear. He is mostly self NWB due to pain.  He fell a week ago and stumbled on his right foot and is concerned he messed his right foot up even more. The rubber on the bottom of his crutches wore away and they slipped and he fell. Marland Kitchen He is not currently following up with orthopedic for RLE foot/ankle due to waiting on insurance approval/authorization. He is currently self pay for everything. Sister in law present at evaluation and states that he only has 5 blood thinner pills left. He is also out of pain medication (Lyrica) which he states he has been without for last week which has contributed to increased pain;   He does report numbness/tingling in RLE lower half of foot. He reports pain is localized to foot/ankle.   Patient presents to therapy with flip flop on LLE; He reports he usually wears tennis shoes on  LLE but prefers to be barefoot on RLE. He reports even wearing a sock on right foot is painful. He will massage and elevate his right foot which does help some. PMH signifcant for acute DVT in RLE, on blood thinners; smoker, past drug abuse;    Limitations Standing;Walking    How long can you sit comfortably? about an hour    How long can you stand comfortably? <5 min    How long can you walk comfortably? about 20 feet without AD; can go a few hundred feet with crutches    Diagnostic tests X-rays of right foot/ankle 6/28 and 7/8 show no fracture, mild swelling; Hasn't been back to MD since last fall ; MRI 5/24: Significantly motion degraded study. Most of the previously seen  restricted diffusion in the bilateral parietal  cortical and  subcortical brain is no longer appreciable. 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 No new or progressive  finding.    Patient Stated Goals to return to work;    Currently in Pain? Yes    Pain Score 4     Pain Location Foot    Pain Orientation Right    Pain Onset More than a month ago             Treatment:   Continued education on desensitization training with wash cloth. Performed on patient on RLE for 2 min. Pt reports no pain.     Supine : Internal/external of RLE with AAROM for IR 10x with 2-3 sec hold in IR AAROM R ankle PF/DF 10 reps each AROM (through decreased range d/t impaired strength) DF 5 reps Contract relax R ankle PF and DF isometrics into red foam pball with 3-5 sec holds x8 reps for each. Contract relax R ankle adduction/abduction isometrics into red foam pball with 3-5 sec holds x8 reps for each AROM R ankle adduction into R ball 5x Contract relax R ankle inversion/eversion isometrics into red foam pball with 3-5 sec holds x8 reps for each PROM stretch into R ankle DF within pain-free range 3x30 sec AAROM R great toe and digits flexion/extension 8x each R great toe isometric 5 x with 3 sec hold R great toe flexion isometric 5x with 1-3 sec holds  Seated R toe towel scrunches x 2 min Seated dynadisc gentle push into dynadisc for PF/DF, side-to-side, CW/CC 8x each for improved ankle mobility Seated ankle rocker board limited range PF/DF 8x each for improved ankle mobility; pt reports feels good stretch Seated weight shift pressing into RLE 10x Pt reports no pain  Standing weight shift pressing into RLE 10x (RLE slightly offloaded with foot placed ahead of L foot). Pt reports no pain.   PT instructs pt that if he is performing weight-shift exercises at home to continue to do so in a  seated position d/t balance. PT also instructs pt of sx to watch out for and to seek emergency care should he experience SOB, rapid HR, increased sweating, further increased swelling and tenderness of RLE. Pt and his mother verbalize understanding.      Education provided throughout session via VC/TC and demonstration to facilitate movement at target joints and correct muscle activation for all exercises performed. Pt with good carryover of technique within session.        PT Short Term Goals - 01/30/21 1421  PT SHORT TERM GOAL #1   Title Patient will wear tennis shoe or other supportive shoe with a solid back on LLE foot at least 90% of the time to improve safety with ambulation and mobility    Baseline 8/2: often wears flip flop/slide shoes    Time 4    Period Weeks    Status New    Target Date 02/27/21      PT SHORT TERM GOAL #2   Title Patient will improve RLE ankle PROM to neutral to prepare foot for standing/weight bearing tasks.    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    Baseline 8/2: reports 3+ falls in last month    Time 4    Period Weeks    Status New    Target Date 02/27/21      PT SHORT TERM GOAL #4   Title Patient will be adherent to HEP at least 3x a week to improve ROM/strength and mobility;    Baseline 8/2: not doing HEP    Time 4    Period Weeks    Status New    Target Date 02/27/21               PT Long Term Goals - 01/30/21 1423       PT LONG TERM GOAL #1   Title Patient will be able to complete 5 times sit<>Stand, <15 sec,  with both feet on floor to improve safety awareness and transfer ability.    Baseline 8/2: 15.85 sec, LLE only;    Time 8    Period Weeks    Status New    Target Date 03/27/21      PT LONG TERM  GOAL #2   Title Patient will improve FOTO score to >50% to indicate improved functional mobility with ADLs.    Baseline 8/2: 46%    Time 8    Period Weeks    Status New    Target Date 03/27/21      PT LONG TERM GOAL #3   Title Patient will improve BLE gross strength to at least 4/5 in hip/knee to indicate improved functional strength for ADLs.    Baseline 8/2: see flowsheet    Time 8    Period Weeks    Status New    Target Date 03/27/21      PT LONG TERM GOAL #4   Title Patient will be able to ambulate at least 50 feet with LRAD bearing weight through BLE for improved safety with ambulation and to increase home ambulation mobility.    Baseline 8/2: uses crutches and self NWB on RLE.    Time 8    Period Weeks    Status New    Target Date 03/27/21      PT LONG TERM GOAL #5   Title Patient will report a worst pain of 6/10 in right foot/ankle to indicate improved tolerance with gait and mobility;    Baseline 8/2: 7+/10 RLE ankle pain;    Time 8    Period Weeks    Status New    Target Date 03/27/21  Plan - 02/07/21 1012     Clinical Impression Statement Pt further progresses weight acceptance onto R LE by performing without pain in seated position and performing with slight off-loading of RLE in standing (with crutches). Pt also shows improvement after consistently performing desensitization exercise with cloth. He had no pain with light to deeper touch to RLE this session. Pt still very challenged with isometrics, particularly DF. The pt will benefit from further skilled PT to improve pain, strength and functional mobility in order to decrease fall risk and return to PLOF.    Personal Factors and Comorbidities Comorbidity 3+;Finances;Social Background;Behavior Pattern    Comorbidities DVT RLE, recent falls with right ankle injury, drug abuse, RLE Sciatic pain, brain injury    Examination-Activity Limitations Caring for Others;Locomotion  Level;Squat;Stairs;Stand;Transfers    Examination-Participation Restrictions Cleaning;Community Activity;Driving;Occupation;Shop;Yard Work    Stability/Clinical Decision Making Unstable/Unpredictable    Rehab Potential Fair    PT Frequency 2x / week    PT Duration 8 weeks    PT Treatment/Interventions ADLs/Self Care Home Management;Cryotherapy;Moist Heat;Gait training;Stair training;Functional mobility training;Therapeutic activities;Therapeutic exercise;Balance training;Neuromuscular re-education;Patient/family education;Orthotic Fit/Training;Manual techniques;Passive range of motion;Energy conservation    PT Next Visit Plan balance formally assessed, assessed right ankle contracture, initiate stretching program    PT Home Exercise Plan discussed stretching, will provide more formal program next session;    Consulted and Agree with Plan of Care Patient             Patient will benefit from skilled therapeutic intervention in order to improve the following deficits and impairments:  Abnormal gait, Decreased balance, Decreased endurance, Difficulty walking, Impaired perceived functional ability, Increased edema, Decreased range of motion, Decreased activity tolerance, Decreased safety awareness, Decreased knowledge of use of DME, Decreased strength, Pain  Visit Diagnosis: Other abnormalities of gait and mobility  Muscle weakness (generalized)  Unsteadiness on feet  Other lack of coordination  Pain in right ankle and joints of right foot     Problem List Patient Active Problem List   Diagnosis Date Noted   DVT (deep venous thrombosis) (HCC) 01/17/2021   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   Constipation 12/21/2020   Acute blood loss anemia 12/21/2020   Dysesthesia--RLE 12/21/2020   Anoxic brain injury (HCC) 12/07/2020   Pressure injury of skin 11/21/2020   AKI (acute kidney injury) (HCC)    Aspiration pneumonia (HCC)    Hypernatremia    Acute respiratory failure with hypoxemia  (HCC)    Encephalopathy    Status epilepticus (HCC) 11/10/2020   Temple PaciniHaley Donnie Gedeon PT, DPT 02/07/2021, 10:38 AM  [facility] [address] [place], [place], [address] Phone: [phone]   Fax:  [phone]  Name: Nelda SevereLacy R Tritch MRN: [id number] Date of Birth: 01/29/1991

## 2021-02-08 ENCOUNTER — Encounter: Payer: Medicaid Other | Admitting: Speech Pathology

## 2021-02-12 ENCOUNTER — Ambulatory Visit: Payer: Self-pay

## 2021-02-12 ENCOUNTER — Encounter: Payer: Self-pay | Admitting: Speech Pathology

## 2021-02-14 ENCOUNTER — Encounter: Payer: Self-pay | Admitting: Speech Pathology

## 2021-02-14 ENCOUNTER — Ambulatory Visit: Payer: Self-pay

## 2021-02-15 ENCOUNTER — Emergency Department
Admission: EM | Admit: 2021-02-15 | Discharge: 2021-02-15 | Disposition: A | Discharge status: Home / Self Care | Payer: Medicaid Other | Attending: Emergency Medicine | Admitting: Emergency Medicine

## 2021-02-15 ENCOUNTER — Emergency Department: Payer: Medicaid Other

## 2021-02-15 ENCOUNTER — Other Ambulatory Visit: Payer: Self-pay

## 2021-02-15 DIAGNOSIS — Z7901 Long term (current) use of anticoagulants: Secondary | ICD-10-CM | POA: Insufficient documentation

## 2021-02-15 DIAGNOSIS — F17210 Nicotine dependence, cigarettes, uncomplicated: Secondary | ICD-10-CM | POA: Insufficient documentation

## 2021-02-15 DIAGNOSIS — T402X1A Poisoning by other opioids, accidental (unintentional), initial encounter: Secondary | ICD-10-CM | POA: Insufficient documentation

## 2021-02-15 DIAGNOSIS — Y900 Blood alcohol level of less than 20 mg/100 ml: Secondary | ICD-10-CM | POA: Insufficient documentation

## 2021-02-15 DIAGNOSIS — R000 Tachycardia, unspecified: Secondary | ICD-10-CM | POA: Insufficient documentation

## 2021-02-15 DIAGNOSIS — T50901A Poisoning by unspecified drugs, medicaments and biological substances, accidental (unintentional), initial encounter: Secondary | ICD-10-CM

## 2021-02-15 LAB — CBC WITH DIFFERENTIAL/PLATELET
Abs Immature Granulocytes: 0.13 10*3/uL — ABNORMAL HIGH (ref 0.00–0.07)
Basophils Absolute: 0 10*3/uL (ref 0.0–0.1)
Basophils Relative: 0 %
Eosinophils Absolute: 0 10*3/uL (ref 0.0–0.5)
Eosinophils Relative: 0 %
HCT: 37.9 % — ABNORMAL LOW (ref 39.0–52.0)
Hemoglobin: 13.8 g/dL (ref 13.0–17.0)
Immature Granulocytes: 1 %
Lymphocytes Relative: 4 %
Lymphs Abs: 0.9 10*3/uL (ref 0.7–4.0)
MCH: 32.4 pg (ref 26.0–34.0)
MCHC: 36.4 g/dL — ABNORMAL HIGH (ref 30.0–36.0)
MCV: 89 fL (ref 80.0–100.0)
Monocytes Absolute: 1.5 10*3/uL — ABNORMAL HIGH (ref 0.1–1.0)
Monocytes Relative: 7 %
Neutro Abs: 17.5 10*3/uL — ABNORMAL HIGH (ref 1.7–7.7)
Neutrophils Relative %: 88 %
Platelets: 268 10*3/uL (ref 150–400)
RBC: 4.26 MIL/uL (ref 4.22–5.81)
RDW: 12.8 % (ref 11.5–15.5)
WBC: 20 10*3/uL — ABNORMAL HIGH (ref 4.0–10.5)
nRBC: 0 % (ref 0.0–0.2)

## 2021-02-15 LAB — COMPREHENSIVE METABOLIC PANEL
ALT: 15 U/L (ref 0–44)
AST: 22 U/L (ref 15–41)
Albumin: 4.8 g/dL (ref 3.5–5.0)
Alkaline Phosphatase: 60 U/L (ref 38–126)
Anion gap: 10 (ref 5–15)
BUN: 15 mg/dL (ref 6–20)
CO2: 26 mmol/L (ref 22–32)
Calcium: 9.3 mg/dL (ref 8.9–10.3)
Chloride: 99 mmol/L (ref 98–111)
Creatinine, Ser: 0.88 mg/dL (ref 0.61–1.24)
GFR, Estimated: >60 mL/min (ref >60)
Glucose, Bld: 129 mg/dL — ABNORMAL HIGH (ref 70–99)
Potassium: 3.5 mmol/L (ref 3.5–5.1)
Sodium: 135 mmol/L (ref 135–145)
Total Bilirubin: 1 mg/dL (ref 0.3–1.2)
Total Protein: 7.7 g/dL (ref 6.5–8.1)

## 2021-02-15 LAB — URINALYSIS, COMPLETE (UACMP) WITH MICROSCOPIC
Bilirubin Urine: NEGATIVE
Glucose, UA: 50 mg/dL — AB
Ketones, ur: 20 mg/dL — AB
Leukocytes,Ua: NEGATIVE
Nitrite: NEGATIVE
Protein, ur: 30 mg/dL — AB
Specific Gravity, Urine: 1.013 (ref 1.005–1.030)
pH: 6 (ref 5.0–8.0)

## 2021-02-15 LAB — ETHANOL: Alcohol, Ethyl (B): <10 mg/dL (ref <10)

## 2021-02-15 LAB — URINE DRUG SCREEN, QUALITATIVE (ARMC ONLY)
Amphetamines, Ur Screen: POSITIVE — AB
Barbiturates, Ur Screen: NOT DETECTED
Benzodiazepine, Ur Scrn: NOT DETECTED
Cannabinoid 50 Ng, Ur ~~LOC~~: POSITIVE — AB
Cocaine Metabolite,Ur ~~LOC~~: POSITIVE — AB
MDMA (Ecstasy)Ur Screen: NOT DETECTED
Methadone Scn, Ur: NOT DETECTED
Opiate, Ur Screen: NOT DETECTED
Phencyclidine (PCP) Ur S: NOT DETECTED
Tricyclic, Ur Screen: NOT DETECTED

## 2021-02-15 MED ORDER — ACETAMINOPHEN 500 MG PO TABS: 1000.0000 mg | ORAL_TABLET | Freq: Once | ORAL | Status: AC

## 2021-02-15 MED ORDER — ONDANSETRON HCL 4 MG/2ML IJ SOLN
4.0000 mg | Freq: Once | INTRAMUSCULAR | Status: AC
  Administered 2021-02-15: 4 mg via INTRAVENOUS
  Filled 2021-02-15: qty 2

## 2021-02-15 MED ORDER — SODIUM CHLORIDE 0.9 % IV BOLUS
1000.0000 mL | Freq: Once | INTRAVENOUS | Status: AC
  Administered 2021-02-15: 1000 mL via INTRAVENOUS

## 2021-02-15 MED ORDER — ACETAMINOPHEN 500 MG PO TABS
ORAL_TABLET | ORAL | Status: AC
  Administered 2021-02-15: 1000 mg via ORAL
  Filled 2021-02-15: qty 2

## 2021-02-15 NOTE — ED Notes (Signed)
Pt family at bedside
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
## 2021-02-15 NOTE — ED Notes (Signed)
This RN introduced self to pt. Pt alert and able to answer RNs questions. IVF running. VSS.

## 2021-02-15 NOTE — Discharge Instructions (Signed)
Return to the ER for worsening symptoms, persistent vomiting, difficulty breathing or other concerns. °

## 2021-02-15 NOTE — ED Notes (Signed)
Pt. Placed on cont. Cardiac monitoring.  
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
## 2021-02-15 NOTE — ED Provider Notes (Signed)
[facility] Emergency Department Provider Note   ____________________________________________   Event Date/Time   First MD Initiated Contact with Patient 02/15/21 [id number]     (approximate)  I have reviewed the triage vital signs and the nursing notes.   HISTORY  Chief Complaint Drug Overdose (Pt. To ED via EMS for OD. Per medic pt. Was found in apartment breathing 2x per minute. Narcan was administered on scene, 4mg  intranasal total. Pt. States he was told by two men at the apartment that substance was cocaine, but as soon as he snorted it, he knew it wasn't. Pt. Believes it was fentanyl. Pt. States male OD'd at the same apartment with him, and was brought to ED as well.)    HPI Nathaniel Lynn is a 30 y.o. male brought to the ED via EMS for accidental overdose.  Patient was found unresponsive and apneic.  4 mg intranasal Narcan administered at the scene with good response.  Patient thought it he was snorting cocaine but suspects he snorted fentanyl.  He was at the same party as another male patient here also for same complaint.  Patient denies active SI/HI/AH/VH.  Endorses nausea.  Denies cough, chest pain, shortness of breath, abdominal pain, vomiting.     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  Prior to Admission medications   Medication Sig Start  Date End Date Taking? Authorizing Provider  acetaminophen (TYLENOL) 325 MG tablet Take 1-2 tablets (325-650 mg total) by mouth every 4 (four) hours as needed for mild pain. 12/20/20   Love, 12/22/20, PA-C  ascorbic acid (VITAMIN C) 500 MG tablet Take 1 tablet (500 mg total) by mouth daily. 12/21/20   Love, 12/23/20, PA-C  folic acid (FOLVITE) 1 MG tablet Take 1 tablet (1 mg total) by mouth daily. 12/20/20   Love, 12/22/20, PA-C  HYDROcodone-acetaminophen (NORCO) 5-325 MG tablet Take 1 tablet by mouth every 6 (six) hours as needed for moderate pain. 01/17/21   Schnier, 01/19/21, MD  methocarbamol (ROBAXIN) 500 MG tablet Take 1 tablet (500 mg total) by mouth every 6 (six) hours as needed for muscle spasms. 12/20/20   Love, 12/22/20, PA-C  Mouthwashes (MOUTH RINSE) LIQD solution 15 mLs by Mouth Rinse route 2 (two) times daily. Patient not taking: No sig reported 12/07/20   Pokhrel, 02/06/21, MD  Multiple Vitamins-Minerals (CERTAVITE/ANTIOXIDANTS) TABS Take 1 tablet by mouth daily. 12/20/20   Love, 12/22/20, PA-C  mupirocin cream (BACTROBAN) 2 % Apply topically daily. Patient not taking: No sig reported 12/07/20   Pokhrel, 02/06/21, MD  Nutritional Supplements (,FEEDING SUPPLEMENT, PROSOURCE PLUS) liquid Take 30 mLs by mouth 2 (two) times daily between meals. Patient not taking: No sig reported 12/07/20   Pokhrel, 02/06/21, MD  oxyCODONE-acetaminophen (PERCOCET) 7.5-325 MG tablet Take 1 tablet by mouth every  6 (six) hours as needed for severe pain. 01/04/21   Joni ReiningSmith, Ronald K, PA-C  polyethylene glycol powder (GLYCOLAX/MIRALAX) 17 GM/SCOOP powder dissolve and Take 1 capful (17 g) by mouth daily. Patient not taking: No sig reported 12/20/20   Love, Evlyn KannerPamela S, PA-C  polyvinyl alcohol (LIQUIFILM TEARS) 1.4 % ophthalmic solution Place 1 drop into both eyes 3 (three) times daily. Patient not taking: No sig reported 12/20/20   Love, Evlyn KannerPamela S, PA-C  pregabalin (LYRICA) 100 MG capsule Take 1 capsule (100 mg total) by mouth 3  (three) times daily. 12/20/20   Love, Evlyn KannerPamela S, PA-C  QUEtiapine (SEROQUEL) 50 MG tablet Take 1 tablet (50 mg total) by mouth in the morning AND 2 tablets (100 mg total) at bedtime. 12/21/20   Love, Evlyn KannerPamela S, PA-C  rivaroxaban (XARELTO) 20 MG TABS tablet Take 1 tablet (20 mg total) by mouth daily with supper. 01/17/21   Schnier, Latina CraverGregory G, MD  thiamine 100 MG tablet Take 1 tablet (100 mg total) by mouth daily. 12/20/20   Love, Evlyn KannerPamela S, PA-C  traZODone (DESYREL) 100 MG tablet Take 1 tablet (100 mg total) by mouth at bedtime. 12/20/20   Love, Evlyn KannerPamela S, PA-C  Zinc Sulfate 220 (50 Zn) MG TABS Take 1 tablet (220 mg total) by mouth daily. 12/21/20   Love, Evlyn KannerPamela S, PA-C    Allergies Penicillins and Penicillins  Family History  Problem Relation Age of Onset   Diabetes Maternal Grandmother    Colon cancer Other    Lung cancer Other     Social History Social History   Tobacco Use   Smoking status: Every Day    Packs/day: 0.50    Years: 10.00    Pack years: 5.00    Types: Cigarettes   Smokeless tobacco: Never  Vaping Use   Vaping Use: Never used  Substance Use Topics   Alcohol use: Yes   Drug use: Yes    Types: IV    Review of Systems  Constitutional: No fever/chills Eyes: No visual changes. ENT: No sore throat. Cardiovascular: Denies chest pain. Respiratory: Denies shortness of breath. Gastrointestinal: No abdominal pain.  Positive for nausea, no vomiting.  No diarrhea.  No constipation. Genitourinary: Negative for dysuria. Musculoskeletal: Negative for back pain. Skin: Negative for rash. Neurological: Negative for headaches, focal weakness or numbness. Psychiatric: Positive for accidental overdose.   ____________________________________________   PHYSICAL EXAM:  VITAL SIGNS: ED Triage Vitals [02/15/21 0532]  Enc Vitals Group     BP      Pulse      Resp      Temp      Temp src      SpO2      Weight 189 lb (85.7 kg)     Height 6' (1.829 m)     Head Circumference       Peak Flow      Pain Score 5     Pain Loc      Pain Edu?      Excl. in GC?     Constitutional: Alert and oriented.  Disheveled appearing and in mild acute distress. Eyes: Conjunctivae are normal. PERRL. EOMI. Head: Atraumatic. Nose: No congestion/rhinnorhea. Mouth/Throat: Mucous membranes are mildly dry. Neck: No stridor.   Cardiovascular: Tachycardic rate, regular rhythm. Grossly normal heart sounds.  Good peripheral circulation. Respiratory: Normal respiratory effort.  No retractions. Lungs CTAB. Gastrointestinal: Soft and nontender to light or deep palpation. No distention. No abdominal bruits. No CVA tenderness. Musculoskeletal: No lower extremity  tenderness nor edema.  No joint effusions. Neurologic:  Normal speech and language. No gross focal neurologic deficits are appreciated.  Skin:  Skin is warm, dry and intact. No rash noted. Psychiatric: Mood and affect are tearful. Speech and behavior are normal.  ____________________________________________   LABS (all labs ordered are listed, but only abnormal results are displayed)  Labs Reviewed  CBC WITH DIFFERENTIAL/PLATELET - Abnormal; Notable for the following components:      Result Value   WBC 20.0 (*)    HCT 37.9 (*)    MCHC 36.4 (*)    Neutro Abs 17.5 (*)    Monocytes Absolute 1.5 (*)    Abs Immature Granulocytes 0.13 (*)    All other components within normal limits  COMPREHENSIVE METABOLIC PANEL - Abnormal; Notable for the following components:   Glucose, Bld 129 (*)    All other components within normal limits  URINE DRUG SCREEN, QUALITATIVE ([facility] ONLY)  ETHANOL  URINALYSIS, COMPLETE (UACMP) WITH MICROSCOPIC   ____________________________________________  EKG  ED ECG REPORT I, Garrison Michie J, the attending physician, personally viewed and interpreted this ECG.   Date: 02/15/2021  EKG Time: 0618  Rate: 118  Rhythm: sinus tachycardia  Axis: Normal  Intervals:none  ST&T Change:  Nonspecific  ____________________________________________  RADIOLOGY I, Haydin Calandra J, personally viewed and evaluated these images (plain radiographs) as part of my medical decision making, as well as reviewing the written report by the radiologist.  ED MD interpretation: No acute cardiopulmonary process  Official radiology report(s): DG Chest Port 1 View  Result Date: 02/15/2021 CLINICAL DATA:  Overdose EXAM: PORTABLE CHEST 1 VIEW COMPARISON:  11/18/2020 FINDINGS: Normal heart size and mediastinal contours. No acute infiltrate or edema. No effusion or pneumothorax. No acute osseous findings. IMPRESSION: Negative chest. Electronically Signed   By: Marnee Spring M.D.   On: 02/15/2021 05:56    ____________________________________________   PROCEDURES  Procedure(s) performed (including Critical Care):  .1-3 Lead EKG Interpretation  Date/Time: 02/15/2021 6:24 AM Performed by: Irean Hong, MD Authorized by: Irean Hong, MD     Interpretation: abnormal     ECG rate:  120   ECG rate assessment: tachycardic     Rhythm: sinus tachycardia     Ectopy: none     Conduction: normal   Comments:     Patient placed on cardiac monitor to evaluate for arrhythmias   ____________________________________________   INITIAL IMPRESSION / ASSESSMENT AND PLAN / ED COURSE  As part of my medical decision making, I reviewed the following data within the electronic MEDICAL RECORD NUMBER Nursing notes reviewed and incorporated, Labs reviewed, EKG interpreted, Old chart reviewed, Radiograph reviewed, and Notes from prior ED visits     30 year old male who presents status post accidental opioid overdose. Differential diagnosis includes, but is not limited to, alcohol, illicit or prescription medications, or other toxic ingestion; intracranial pathology such as stroke or intracerebral hemorrhage; fever or infectious causes including sepsis; hypoxemia and/or hypercarbia; uremia; trauma; endocrine related  disorders such as diabetes, hypoglycemia, and thyroid-related diseases; hypertensive encephalopathy; etc.   Will obtain lab work, chest x-ray.  Initiate IV fluid resuscitation, monitor in the ED and reassess.  Clinical Course as of 02/15/21 0715  Fri Feb 15, 2021  1245 Care transferred to Dr. Larinda Buttery at change of shift.  Tachycardia improved.  Leukocytosis noted which may be seen in substance use.  Patient is afebrile with unremarkable chest x-ray.  Will check UA.  Anticipate discharge home after 4-hour observation if patient remained  stable. [JS]    Clinical Course User Index [JS] Irean Hong, MD     ____________________________________________   FINAL CLINICAL IMPRESSION(S) / ED DIAGNOSES  Final diagnoses:  Accidental drug overdose, initial encounter     ED Discharge Orders     None        Note:  This document was prepared using Dragon voice recognition software and may include unintentional dictation errors.    Irean Hong, MD 02/15/21 [phone]

## 2021-02-15 NOTE — ED Notes (Signed)
Pt c/o nausea/ MD made aware. No new orders at this time

## 2021-02-19 ENCOUNTER — Ambulatory Visit: Payer: Self-pay

## 2021-02-19 ENCOUNTER — Encounter: Payer: Self-pay | Admitting: Speech Pathology

## 2021-02-21 ENCOUNTER — Other Ambulatory Visit: Payer: Self-pay

## 2021-02-21 ENCOUNTER — Ambulatory Visit: Payer: Self-pay

## 2021-02-21 ENCOUNTER — Encounter: Payer: Self-pay | Admitting: Speech Pathology

## 2021-02-21 DIAGNOSIS — R2689 Other abnormalities of gait and mobility: Secondary | ICD-10-CM

## 2021-02-21 DIAGNOSIS — R2681 Unsteadiness on feet: Secondary | ICD-10-CM

## 2021-02-21 DIAGNOSIS — M6281 Muscle weakness (generalized): Secondary | ICD-10-CM

## 2021-02-21 DIAGNOSIS — R278 Other lack of coordination: Secondary | ICD-10-CM

## 2021-02-21 NOTE — Therapy (Signed)
[facility] MAIN [facility] SERVICES [address] [place], [place], [address] Phone: [phone]   Fax:  [phone]  Physical Therapy Treatment  Patient Details  Name: Nathaniel Lynn MRN: [id number] Date of Birth: February 07, 1991 Referring Provider (PT): Delle Reining [place]   Encounter Date: 02/21/2021   PT End of Session - 02/21/21 1733     Visit Number 4    Number of Visits 17    Date for PT Re-Evaluation 03/26/21    Authorization Type Self pay, awaiting Medicaid approval    PT Start Time [phone]    PT Stop Time 1000    PT Time Calculation (min) 44 min    Equipment Utilized During Treatment Gait belt    Activity Tolerance Patient limited by pain    Behavior During Therapy WFL for tasks assessed/performed             Past Medical History:  Diagnosis Date   DVT (deep venous thrombosis) (HCC)    IVDU (intravenous drug user)     History reviewed. No pertinent surgical history.  There were no vitals filed for this visit.   Subjective Assessment - 02/21/21 0908     Subjective Pt reports last week he started walking without using an AD. He reports no pain in RLE following ambulation. He does sometimes "grab onto walls" if he needs help with balance.    Patient is accompained by: Family member   Sister in Social worker, Marchelle Folks   Pertinent History 30 yo Male was found unresponsive on 5/14 due to drug abuse. Patient was intubated and monitored with EEG. He was extubated on 5/25. He was discharged to inpatient rehab on 6/10 where he received PT/OT and ST. Patient was discharged hom on 6/25. He reports since being home he has not received any physical therapy services and has  not been doing a formal exercise program. He was instructed to use a RW for ambulation, however patient prefers to use BUE axillary crutches. He reports multiple falls. On 6/29 he came to ED with right foot/ankle pain. He was diagnosed with a sprain and given a CAM boot. Patient states the doctor  said to wear the boot as needed, but he doesn't wear it because of pain. On 7/8 he came back to ED with RLE pain and was found to have a DVT. He was given oral blood thinners which he is still taking. Patient has boot for RLE which he prefers not to wear. He is mostly self NWB due to pain.  He fell a week ago and stumbled on his right foot and is concerned he messed his right foot up even more. The rubber on the bottom of his crutches wore away and they slipped and he fell. Marland Kitchen He is not currently following up with orthopedic for RLE foot/ankle due to waiting on insurance approval/authorization. He is currently self pay for everything. Sister in law present at evaluation and states that he only has 5 blood thinner pills left. He is also out of pain medication (Lyrica) which he states he has been without for last week which has contributed to increased pain;   He does report numbness/tingling in RLE lower half of foot. He reports pain is localized to foot/ankle.   Patient presents to therapy with flip flop on LLE; He reports he usually wears tennis shoes on LLE but prefers to be barefoot on RLE. He reports even wearing a sock on right foot is painful. He will massage and elevate  his right foot which does help some. PMH signifcant for acute DVT in RLE, on blood thinners; smoker, past drug abuse;    Limitations Standing;Walking    How long can you sit comfortably? about an hour    How long can you stand comfortably? <5 min    How long can you walk comfortably? about 20 feet without AD; can go a few hundred feet with crutches    Diagnostic tests X-rays of right foot/ankle 6/28 and 7/8 show no fracture, mild swelling; Hasn't been back to MD since last fall ; MRI 5/24: Significantly motion degraded study. Most of the previously seen  restricted diffusion in the bilateral parietal cortical and  subcortical brain is no longer appreciable. 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 No new or progressive  finding.    Patient Stated Goals to return to work;    Currently in Pain? No/denies    Pain Onset More than a month ago           Interventions:  Seated: PT assists with RLE ROM throughout DF - 3x12 (PT provides assist for RLE). Pt rates medium. Heel raises - 3x12. Attempts one set with 2.5# AW on BLEs. Isometric ankle/foot adduction/inversion into red foam ball - x multiple reps with 5 sec holds. Pt reports RLE fatigue. Unequal force production compared to LLE. Isometric ankle/foot abduction/eversion into red foam ball - x multiple reps. Continued reports of difficulty/fatigue.   In // bars: CGA throughout Ambulating forward/backward length of bars with stepping over half-foam x multiple reps. Pt relies on BUE weightbearing for slight-offloading of RLE. He is able to demo ambulating in // bars without UE weightbearing, but does use UE assist to negotiate obstacle/has difficulty clearing obstacle with R foot. Cuing to take larger steps.   High-knee marches - 6x length of bars. BUE support.   Standing hedgehog heel taps to promote ankle DF - 15-20x each LE. Very challenging for pt.  Seated: Ankle rocker board for RLE DF/PF. Pt provides hands-on assist to RLE, particularly for DF. 2x10  Standing gastroc stretch for RLE. Foot supported on half-foam. Pt holding onto bar. Cuing to maintain gentle stretch, maintain pain-free stretch. 2x30 sec  Standing RLE lunge into calf stretch - 10x. Cuing to perform in pain-free ROM.  Semi-tandem stance - 2x30 sec BLEs Tandem stance - 2x30 sec with LLE as primary stance leg. Unable to attain full position with RLE as stance leg attempts 2x30 sec Modified SLB with LLE on 6" step + airex, RLE on firm surface - 2x30 sec. No decrease in postural stability.  On mat-table in long-sitting: RLE hip IR "rolls"  2x10 Isometric hip IR with with adduction with BLEs pressing into red foam pball - 3x20 sec holds.    Education provided throughout session via VC/TC and demonstration to facilitate movement at target joints and correct muscle activation for all exercises performed. Pt with good carryover of technique within session.    PT Education - 02/21/21 1733     Education Details exercise technique, body mechanics    Person(s) Educated Patient    Methods Explanation;Demonstration;Verbal cues;Tactile cues    Comprehension Verbalized understanding;Returned demonstration;Need further instruction              PT Short Term Goals - 01/30/21 1421       PT SHORT TERM  GOAL #1   Title Patient will wear tennis shoe or other supportive shoe with a solid back on LLE foot at least 90% of the time to improve safety with ambulation and mobility    Baseline 8/2: often wears flip flop/slide shoes    Time 4    Period Weeks    Status New    Target Date 02/27/21      PT SHORT TERM GOAL #2   Title Patient will improve RLE ankle PROM to neutral to prepare foot for standing/weight bearing tasks.    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    Baseline 8/2: reports 3+ falls in last month    Time 4    Period Weeks    Status New    Target Date 02/27/21      PT SHORT TERM GOAL #4   Title Patient will be adherent to HEP at least 3x a week to improve ROM/strength and mobility;    Baseline 8/2: not doing HEP    Time 4    Period Weeks    Status New    Target Date 02/27/21               PT Long Term Goals - 01/30/21 1423       PT LONG TERM GOAL #1   Title Patient will be able to complete 5 times sit<>Stand, <15 sec,  with both feet on floor to improve safety awareness and transfer  ability.    Baseline 8/2: 15.85 sec, LLE only;    Time 8    Period Weeks    Status New    Target Date 03/27/21      PT LONG TERM GOAL #2   Title Patient will improve FOTO score to >50% to indicate improved functional mobility with ADLs.    Baseline 8/2: 46%    Time 8    Period Weeks    Status New    Target Date 03/27/21      PT LONG TERM GOAL #3   Title Patient will improve BLE gross strength to at least 4/5 in hip/knee to indicate improved functional strength for ADLs.    Baseline 8/2: see flowsheet    Time 8    Period Weeks    Status New    Target Date 03/27/21      PT LONG TERM GOAL #4   Title Patient will be able to ambulate at least 50 feet with LRAD bearing weight through BLE for improved safety with ambulation and to increase home ambulation mobility.    Baseline 8/2: uses crutches and self NWB on RLE.    Time 8    Period Weeks    Status New    Target Date 03/27/21      PT LONG TERM GOAL #5   Title Patient will report a worst pain of 6/10 in right foot/ankle to indicate improved tolerance with gait and mobility;    Baseline 8/2: 7+/10 RLE ankle pain;    Time 8    Period Weeks    Status New    Target Date 03/27/21  Plan - 02/21/21 1744     Clinical Impression Statement Pt shows progress today exhibiting ability to ambulate/fully weightbear on RLE without pain and without an AD. However, d/t decreased balance PT instructs pt to continue using a SPC when ambulating for safety. Pt verbalizes understanding. While pt shows progress, he is still challenged with ROM restrictions of R ankle, poor balance of RLE and decreased strength of RLE. The pt will continue to benefit from further skilled PT to improve strength, ROM, balance and functional mobility to decrease fall risk and return to PLOF.    Personal Factors and Comorbidities Comorbidity 3+;Finances;Social Background;Behavior Pattern    Comorbidities DVT RLE, recent falls with right ankle injury,  drug abuse, RLE Sciatic pain, brain injury    Examination-Activity Limitations Caring for Others;Locomotion Level;Squat;Stairs;Stand;Transfers    Examination-Participation Restrictions Cleaning;Community Activity;Driving;Occupation;Shop;Yard Work    Stability/Clinical Decision Making Unstable/Unpredictable    Rehab Potential Fair    PT Frequency 2x / week    PT Duration 8 weeks    PT Treatment/Interventions ADLs/Self Care Home Management;Cryotherapy;Moist Heat;Gait training;Stair training;Functional mobility training;Therapeutic activities;Therapeutic exercise;Balance training;Neuromuscular re-education;Patient/family education;Orthotic Fit/Training;Manual techniques;Passive range of motion;Energy conservation    PT Next Visit Plan balance formally assessed, assessed right ankle contracture, initiate stretching program    PT Home Exercise Plan discussed stretching, will provide more formal program next session;    Consulted and Agree with Plan of Care Patient             Patient will benefit from skilled therapeutic intervention in order to improve the following deficits and impairments:  Abnormal gait, Decreased balance, Decreased endurance, Difficulty walking, Impaired perceived functional ability, Increased edema, Decreased range of motion, Decreased activity tolerance, Decreased safety awareness, Decreased knowledge of use of DME, Decreased strength, Pain  Visit Diagnosis: Muscle weakness (generalized)  Other abnormalities of gait and mobility  Unsteadiness on feet  Other lack of coordination     Problem List Patient Active Problem List   Diagnosis Date Noted   DVT (deep venous thrombosis) (HCC) 01/17/2021   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   Constipation 12/21/2020   Acute blood loss anemia 12/21/2020   Dysesthesia--RLE 12/21/2020   Anoxic brain injury (HCC) 12/07/2020   Pressure injury of skin 11/21/2020   AKI (acute kidney injury) (HCC)    Aspiration pneumonia (HCC)     Hypernatremia    Acute respiratory failure with hypoxemia (HCC)    Encephalopathy    Status epilepticus (HCC) 11/10/2020   Temple PaciniHaley Mariyana Fulop PT, DPT 02/21/2021, 5:48 PM  [facility] MAIN [facility] [address] [place], [place], [address] Phone: [phone]   Fax:  [phone]  Name: Nathaniel Lynn MRN: [id number] Date of Birth: 06/30/1990

## 2021-02-26 ENCOUNTER — Ambulatory Visit: Payer: Self-pay

## 2021-02-26 ENCOUNTER — Encounter: Payer: Self-pay | Admitting: Speech Pathology

## 2021-02-26 ENCOUNTER — Other Ambulatory Visit: Payer: Self-pay

## 2021-02-26 DIAGNOSIS — M6281 Muscle weakness (generalized): Secondary | ICD-10-CM

## 2021-02-26 DIAGNOSIS — R2689 Other abnormalities of gait and mobility: Secondary | ICD-10-CM

## 2021-02-26 DIAGNOSIS — R2681 Unsteadiness on feet: Secondary | ICD-10-CM

## 2021-02-26 NOTE — Therapy (Signed)
[place] MAIN [facility] SERVICES [address] [place], [place], [address] Phone: [phone]   Fax:  [phone]  Physical Therapy Treatment  Patient Details  Name: Nathaniel Lynn MRN: [id number] Date of Birth: 1990/09/10 Referring Provider (PT): Delle Reining [place]   Encounter Date: 02/26/2021   PT End of Session - 02/26/21 1228     Visit Number 5    Number of Visits 17    Date for PT Re-Evaluation 03/26/21    Authorization Type Self pay, awaiting Medicaid approval    PT Start Time 1110    PT Stop Time 1150    PT Time Calculation (min) 40 min    Equipment Utilized During Treatment Gait belt    Activity Tolerance Patient limited by pain    Behavior During Therapy WFL for tasks assessed/performed             Past Medical History:  Diagnosis Date   DVT (deep venous thrombosis) (HCC)    IVDU (intravenous drug user)     History reviewed. No pertinent surgical history.  There were no vitals filed for this visit.   Subjective Assessment - 02/26/21 1225     Subjective Patient presents to physical therapy without an AD. Has been compliant with HEP and has been practicing his walking.  Was watching his kids this weekend.    Patient is accompained by: Family member   Sister in Social worker, Marchelle Folks   Pertinent History 30 yo Male was found unresponsive on 5/14 due to drug abuse. Patient was intubated and monitored with EEG. He was extubated on 5/25. He was discharged to inpatient rehab on 6/10 where he received PT/OT and ST. Patient was discharged hom on 6/25. He reports since being home he has not received any physical therapy services and has  not been doing a formal exercise program. He was instructed to use a RW for ambulation, however patient prefers to use BUE axillary crutches. He reports multiple falls. On 6/29 he came to ED with right foot/ankle pain. He was diagnosed with a sprain and given a CAM boot. Patient states the doctor said to wear the boot  as needed, but he doesn't wear it because of pain. On 7/8 he came back to ED with RLE pain and was found to have a DVT. He was given oral blood thinners which he is still taking. Patient has boot for RLE which he prefers not to wear. He is mostly self NWB due to pain.  He fell a week ago and stumbled on his right foot and is concerned he messed his right foot up even more. The rubber on the bottom of his crutches wore away and they slipped and he fell. Marland Kitchen He is not currently following up with orthopedic for RLE foot/ankle due to waiting on insurance approval/authorization. He is currently self pay for everything. Sister in law present at evaluation and states that he only has 5 blood thinner pills left. He is also out of pain medication (Lyrica) which he states he has been without for last week which has contributed to increased pain;   He does report numbness/tingling in RLE lower half of foot. He reports pain is localized to foot/ankle.   Patient presents to therapy with flip flop on LLE; He reports he usually wears tennis shoes on LLE but prefers to be barefoot on RLE. He reports even wearing a sock on right foot is painful. He will massage and elevate his right foot which does  help some. PMH signifcant for acute DVT in RLE, on blood thinners; smoker, past drug abuse;    Limitations Standing;Walking    How long can you sit comfortably? about an hour    How long can you stand comfortably? <5 min    How long can you walk comfortably? about 20 feet without AD; can go a few hundred feet with crutches    Diagnostic tests X-rays of right foot/ankle 6/28 and 7/8 show no fracture, mild swelling; Hasn't been back to MD since last fall ; MRI 5/24: Significantly motion degraded study. Most of the previously seen  restricted diffusion in the bilateral parietal cortical and  subcortical brain is no longer appreciable. 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 No new or progressive  finding.    Patient Stated Goals to return to work;    Currently in Pain? No/denies              Neuro Re-ed: close CGA and cues for sequencing/safety Airex pad hedghog taps : three hedgehogs, finger tip support 8x each LE Airex pad dynadisc modified tandem stance 2x 30 second holds each foot placement Standing marches with no UE support 10x each LE; very challenging to stabilize on RLE  Heel toe rocker; very challenging for patient 10x each LE placement Half foam roller df/pf finger tip support 15x  TherEx: cues for body mechanics and sequencing   stair hamstring stretch 30 seconds each LE; cue for medium hold Stair gastroc stretch 30 seconds each LE Posterior lunge 10x each LE with crane lunge between ; heavy BUE support Squat with BUE support, cue to weight shift onto affected RLE TRX squat 15x TRX modified split squat with left foot forward 10x; very challenging   Seated: Heel toe raises 10x Lateral step over hedgehog 15x GTB adduction 15x  LAQ 10x each LE  HEP education and peformance  Access Code: [email or web address] URL: [email or web address] Date: 02/26/2021 Prepared by: Precious Bard  Exercises Lunge with Counter Support - 1 x daily - 7 x weekly - 2 sets - 10 reps - 5 hold Mini Squat with Counter Support - 1 x daily - 7 x weekly - 2 sets - 10 reps - 5 hold Seated Heel Toe Raises - 1 x daily - 7 x weekly - 2 sets - 10 reps - 5 hold Seated Long Arc Quad - 1 x daily - 7 x weekly - 2 sets - 10 reps - 5 hold Standing Hip Flexion March - 1 x daily - 7 x weekly - 2 sets - 10 reps - 5 hold       Pt educated throughout session about proper posture and technique with exercises. Improved exercise technique, movement at target joints, use of target muscles after min to mod verbal, visual, tactile cues.   Patient is demonstrating  excellent progression in mobility. Due to progress was given advanced HEP and additional task of writing down what he will need to be able to do to return to work. Patient demonstrates excellent motivation throughout physical therapy session. Increased tolerance for weight shift and increased dorsiflexion noted. The pt will continue to benefit from further skilled PT to improve strength, ROM, balance and functional mobility to decrease fall risk and return to PLOF.               PT Education -  02/26/21 1228     Education Details HEP, exercise technique, body mechanics, weight shift    Person(s) Educated Patient    Methods Explanation;Demonstration;Tactile cues;Verbal cues    Comprehension Verbalized understanding;Returned demonstration;Verbal cues required;Tactile cues required              PT Short Term Goals - 01/30/21 1421       PT SHORT TERM GOAL #1   Title Patient will wear tennis shoe or other supportive shoe with a solid back on LLE foot at least 90% of the time to improve safety with ambulation and mobility    Baseline 8/2: often wears flip flop/slide shoes    Time 4    Period Weeks    Status New    Target Date 02/27/21      PT SHORT TERM GOAL #2   Title Patient will improve RLE ankle PROM to neutral to prepare foot for standing/weight bearing tasks.    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    Baseline 8/2: reports 3+ falls in last month    Time 4    Period Weeks    Status New    Target Date 02/27/21      PT SHORT TERM GOAL #4   Title Patient will be adherent to HEP at least 3x a week to improve ROM/strength and mobility;    Baseline 8/2: not doing HEP    Time 4    Period Weeks    Status New    Target Date 02/27/21                PT Long Term Goals - 01/30/21 1423       PT LONG TERM GOAL #1   Title Patient will be able to complete 5 times sit<>Stand, <15 sec,  with both feet on floor to improve safety awareness and transfer ability.    Baseline 8/2: 15.85 sec, LLE only;    Time 8    Period Weeks    Status New    Target Date 03/27/21      PT LONG TERM GOAL #2   Title Patient will improve FOTO score to >50% to indicate improved functional mobility with ADLs.    Baseline 8/2: 46%    Time 8    Period Weeks    Status New    Target Date 03/27/21      PT LONG TERM GOAL #3   Title Patient will improve BLE gross strength to at least 4/5 in hip/knee to indicate improved functional strength for ADLs.    Baseline 8/2: see flowsheet    Time 8    Period Weeks    Status New    Target Date 03/27/21      PT LONG TERM GOAL #4   Title Patient will be able to ambulate at least 50 feet with LRAD bearing weight through BLE for improved safety with ambulation and to increase home ambulation mobility.    Baseline 8/2: uses crutches and self NWB on RLE.    Time 8    Period Weeks    Status New    Target Date 03/27/21      PT LONG TERM  GOAL #5   Title Patient will report a worst pain of 6/10 in right foot/ankle to indicate improved tolerance with gait and mobility;    Baseline 8/2: 7+/10 RLE ankle pain;    Time 8    Period Weeks    Status New    Target Date 03/27/21                   Plan - 02/26/21 1231     Clinical Impression Statement Patient is demonstrating excellent progression in mobility. Due to progress was given advanced HEP and additional task of writing down what he will need to be able to do to return to work. Patient demonstrates excellent motivation throughout physical therapy session. Increased tolerance for weight shift and increased dorsiflexion noted. The pt will continue to benefit from further skilled PT to improve strength, ROM, balance and functional mobility to decrease fall risk and  return to PLOF.    Personal Factors and Comorbidities Comorbidity 3+;Finances;Social Background;Behavior Pattern    Comorbidities DVT RLE, recent falls with right ankle injury, drug abuse, RLE Sciatic pain, brain injury    Examination-Activity Limitations Caring for Others;Locomotion Level;Squat;Stairs;Stand;Transfers    Examination-Participation Restrictions Cleaning;Community Activity;Driving;Occupation;Shop;Yard Work    Stability/Clinical Decision Making Unstable/Unpredictable    Rehab Potential Fair    PT Frequency 2x / week    PT Duration 8 weeks    PT Treatment/Interventions ADLs/Self Care Home Management;Cryotherapy;Moist Heat;Gait training;Stair training;Functional mobility training;Therapeutic activities;Therapeutic exercise;Balance training;Neuromuscular re-education;Patient/family education;Orthotic Fit/Training;Manual techniques;Passive range of motion;Energy conservation    PT Next Visit Plan balance formally assessed, assessed right ankle contracture, initiate stretching program    PT Home Exercise Plan discussed stretching, will provide more formal program next session;    Consulted and Agree with Plan of Care Patient             Patient will benefit from skilled therapeutic intervention in order to improve the following deficits and impairments:  Abnormal gait, Decreased balance, Decreased endurance, Difficulty walking, Impaired perceived functional ability, Increased edema, Decreased range of motion, Decreased activity tolerance, Decreased safety awareness, Decreased knowledge of use of DME, Decreased strength, Pain  Visit Diagnosis: Muscle weakness (generalized)  Other abnormalities of gait and mobility  Unsteadiness on feet     Problem List Patient Active Problem List   Diagnosis Date Noted   DVT (deep venous thrombosis) (HCC) 01/17/2021   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   Constipation 12/21/2020   Acute blood loss anemia 12/21/2020   Dysesthesia--RLE  12/21/2020   Anoxic brain injury (HCC) 12/07/2020   Pressure injury of skin 11/21/2020   AKI (acute kidney injury) (HCC)    Aspiration pneumonia (HCC)    Hypernatremia    Acute respiratory failure with hypoxemia (HCC)    Encephalopathy    Status epilepticus (HCC) 11/10/2020    Precious BardMarina Jermaine Neuharth, PT, DPT  02/26/2021, 12:33 PM  [place] [facility]IN [facility] [address] [place], [place], [address] Phone: [phone]   Fax:  [phone]  Name: Nathaniel Lynn MRN: [id number] Date of Birth: 11/19/1990

## 2021-02-28 ENCOUNTER — Encounter: Payer: Self-pay | Admitting: Speech Pathology

## 2021-02-28 ENCOUNTER — Ambulatory Visit: Payer: Self-pay

## 2021-03-05 ENCOUNTER — Encounter: Payer: Self-pay | Admitting: Speech Pathology

## 2021-03-05 ENCOUNTER — Ambulatory Visit (INDEPENDENT_AMBULATORY_CARE_PROVIDER_SITE_OTHER): Payer: Medicaid Other | Admitting: Nurse Practitioner

## 2021-03-05 ENCOUNTER — Encounter (INDEPENDENT_AMBULATORY_CARE_PROVIDER_SITE_OTHER): Payer: Self-pay | Admitting: Nurse Practitioner

## 2021-03-05 ENCOUNTER — Ambulatory Visit: Payer: Self-pay | Attending: Physical Medicine and Rehabilitation

## 2021-03-07 ENCOUNTER — Ambulatory Visit: Payer: Medicaid Other

## 2021-03-07 ENCOUNTER — Encounter: Payer: Self-pay | Admitting: Speech Pathology

## 2021-03-12 ENCOUNTER — Ambulatory Visit: Payer: Self-pay

## 2021-03-12 ENCOUNTER — Encounter: Payer: Self-pay | Admitting: Speech Pathology

## 2021-03-13 ENCOUNTER — Encounter: Payer: Self-pay | Attending: Registered Nurse | Admitting: Physical Medicine & Rehabilitation

## 2021-03-13 DIAGNOSIS — M79671 Pain in right foot: Secondary | ICD-10-CM | POA: Insufficient documentation

## 2021-03-13 DIAGNOSIS — Z5181 Encounter for therapeutic drug level monitoring: Secondary | ICD-10-CM | POA: Insufficient documentation

## 2021-03-13 DIAGNOSIS — Z79891 Long term (current) use of opiate analgesic: Secondary | ICD-10-CM | POA: Insufficient documentation

## 2021-03-13 DIAGNOSIS — G894 Chronic pain syndrome: Secondary | ICD-10-CM | POA: Insufficient documentation

## 2021-03-13 DIAGNOSIS — G931 Anoxic brain damage, not elsewhere classified: Secondary | ICD-10-CM | POA: Insufficient documentation

## 2021-03-13 DIAGNOSIS — M25571 Pain in right ankle and joints of right foot: Secondary | ICD-10-CM | POA: Insufficient documentation

## 2021-03-13 DIAGNOSIS — R208 Other disturbances of skin sensation: Secondary | ICD-10-CM | POA: Insufficient documentation

## 2021-03-14 ENCOUNTER — Encounter: Payer: Self-pay | Admitting: Speech Pathology

## 2021-03-14 ENCOUNTER — Ambulatory Visit: Payer: Self-pay

## 2021-03-19 ENCOUNTER — Encounter: Payer: Self-pay | Admitting: Speech Pathology

## 2021-03-19 ENCOUNTER — Ambulatory Visit: Payer: Self-pay

## 2021-03-21 ENCOUNTER — Encounter (INDEPENDENT_AMBULATORY_CARE_PROVIDER_SITE_OTHER): Payer: Medicaid Other

## 2021-03-21 ENCOUNTER — Encounter (INDEPENDENT_AMBULATORY_CARE_PROVIDER_SITE_OTHER): Payer: Self-pay | Admitting: Vascular Surgery

## 2021-03-21 ENCOUNTER — Encounter: Payer: Self-pay | Admitting: Speech Pathology

## 2021-03-21 ENCOUNTER — Ambulatory Visit (INDEPENDENT_AMBULATORY_CARE_PROVIDER_SITE_OTHER): Payer: Medicaid Other | Admitting: Vascular Surgery

## 2021-03-21 ENCOUNTER — Ambulatory Visit: Payer: Self-pay

## 2021-03-26 ENCOUNTER — Ambulatory Visit: Payer: Self-pay

## 2021-03-26 ENCOUNTER — Encounter: Payer: Self-pay | Admitting: Speech Pathology

## 2021-03-28 ENCOUNTER — Ambulatory Visit: Payer: Self-pay

## 2021-03-28 ENCOUNTER — Encounter: Payer: Self-pay | Admitting: Speech Pathology

## 2021-04-02 ENCOUNTER — Ambulatory Visit: Payer: Self-pay

## 2021-04-02 ENCOUNTER — Encounter: Payer: Self-pay | Admitting: Speech Pathology

## 2021-04-04 ENCOUNTER — Encounter: Payer: Self-pay | Admitting: Speech Pathology

## 2021-04-04 ENCOUNTER — Ambulatory Visit: Payer: Self-pay

## 2021-04-09 ENCOUNTER — Ambulatory Visit: Payer: Self-pay

## 2021-04-09 ENCOUNTER — Encounter: Payer: Self-pay | Admitting: Speech Pathology

## 2021-04-11 ENCOUNTER — Encounter: Payer: Self-pay | Admitting: Speech Pathology

## 2021-04-11 ENCOUNTER — Ambulatory Visit: Payer: Self-pay

## 2021-04-16 ENCOUNTER — Encounter: Payer: Self-pay | Admitting: Speech Pathology

## 2021-04-16 ENCOUNTER — Ambulatory Visit: Payer: Self-pay

## 2021-04-18 ENCOUNTER — Ambulatory Visit: Payer: Self-pay

## 2021-04-18 ENCOUNTER — Encounter: Payer: Self-pay | Admitting: Speech Pathology

## 2021-04-23 ENCOUNTER — Ambulatory Visit: Payer: Self-pay

## 2021-04-23 ENCOUNTER — Encounter: Payer: Self-pay | Admitting: Speech Pathology

## 2021-04-25 ENCOUNTER — Encounter: Payer: Self-pay | Admitting: Speech Pathology

## 2021-04-25 ENCOUNTER — Ambulatory Visit: Payer: Self-pay

## 2021-04-30 ENCOUNTER — Encounter: Payer: Self-pay | Admitting: Speech Pathology

## 2021-04-30 ENCOUNTER — Ambulatory Visit: Payer: Self-pay

## 2021-05-02 ENCOUNTER — Ambulatory Visit: Payer: Self-pay

## 2021-05-02 ENCOUNTER — Encounter: Payer: Self-pay | Admitting: Speech Pathology

## 2021-05-07 ENCOUNTER — Ambulatory Visit: Payer: Self-pay

## 2021-05-07 ENCOUNTER — Encounter: Payer: Self-pay | Admitting: Speech Pathology

## 2021-05-09 ENCOUNTER — Encounter: Payer: Self-pay | Admitting: Speech Pathology

## 2021-05-09 ENCOUNTER — Ambulatory Visit: Payer: Self-pay

## 2021-05-14 ENCOUNTER — Ambulatory Visit: Payer: Medicaid Other

## 2021-05-16 ENCOUNTER — Ambulatory Visit: Payer: Medicaid Other

## 2021-05-21 ENCOUNTER — Ambulatory Visit: Payer: Medicaid Other

## 2021-05-28 ENCOUNTER — Ambulatory Visit: Payer: Medicaid Other

## 2021-05-30 ENCOUNTER — Ambulatory Visit: Payer: Medicaid Other

## 2021-06-04 ENCOUNTER — Ambulatory Visit: Payer: Medicaid Other

## 2021-06-06 ENCOUNTER — Ambulatory Visit: Payer: Medicaid Other

## 2021-06-11 ENCOUNTER — Ambulatory Visit: Payer: Medicaid Other

## 2021-06-13 ENCOUNTER — Ambulatory Visit: Payer: Medicaid Other

## 2021-06-18 ENCOUNTER — Ambulatory Visit: Payer: Medicaid Other

## 2021-06-20 ENCOUNTER — Ambulatory Visit: Payer: Medicaid Other

## 2021-06-25 ENCOUNTER — Ambulatory Visit: Payer: Medicaid Other

## 2021-06-27 ENCOUNTER — Ambulatory Visit: Payer: Medicaid Other

## 2022-09-24 IMAGING — CT CT HEAD W/O CM
4 series · 16 of 47 positions shown, 18 images · non-contrast
Comparison: CT head 11/10/2020.  MRI head 11/12/2020

CLINICAL DATA: Delirium

EXAM:
CT HEAD WITHOUT CONTRAST
TECHNIQUE: Contiguous axial images were obtained from the base of the skull
through the vertex without intravenous contrast.

[Series 3: head bone · axial · 0.47mm/px · z∈[-108,-76]mm · 3 of 82 slices shown]
[im 9/82  bone]
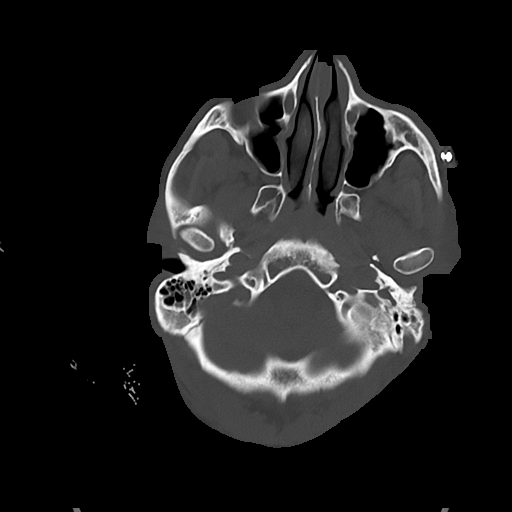
[im 17/82  bone]
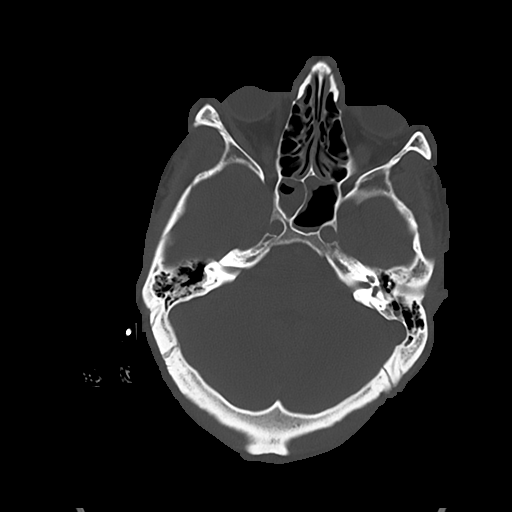
[im 25/82  bone]
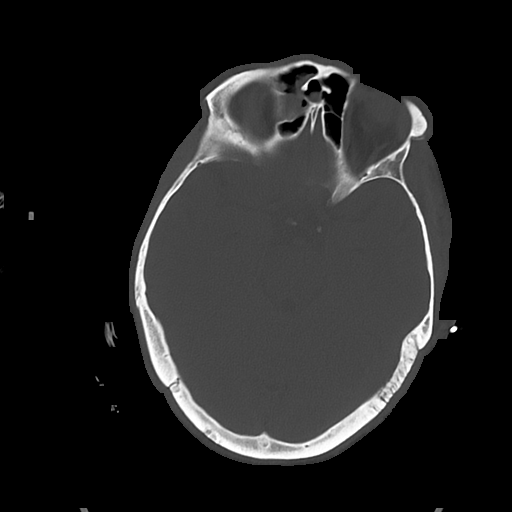

[Series 4: head wo · axial · 0.47mm/px · z∈[-104,+16]mm · 7 of 33 slices shown, 9 images]
[im 5/33  brain]
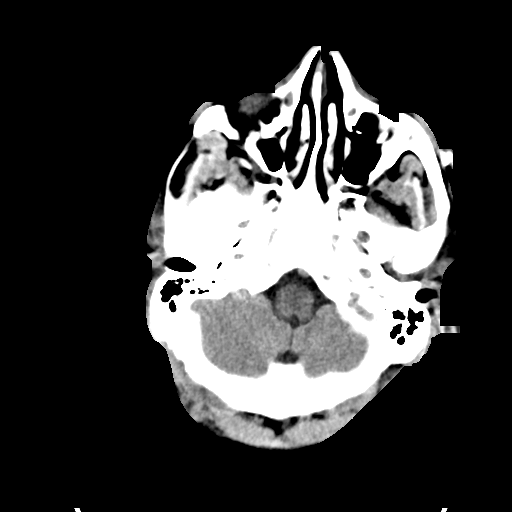
[im 5/33  bone]
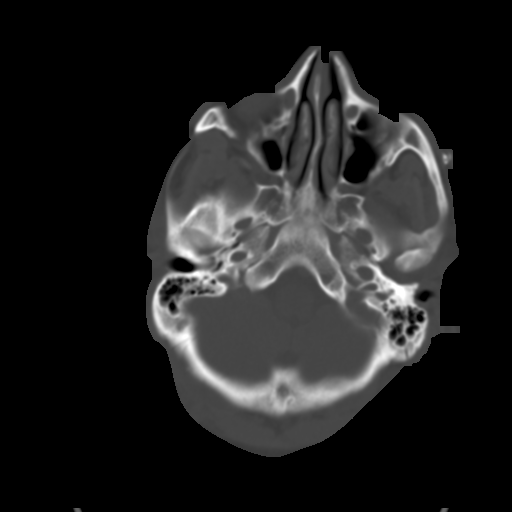
[im 9/33  brain]
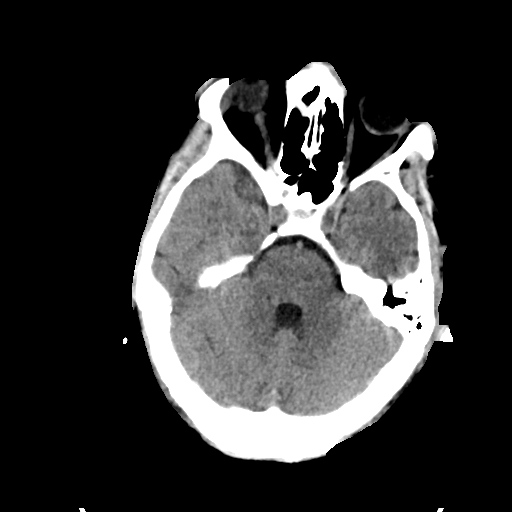
[im 13/33  brain]
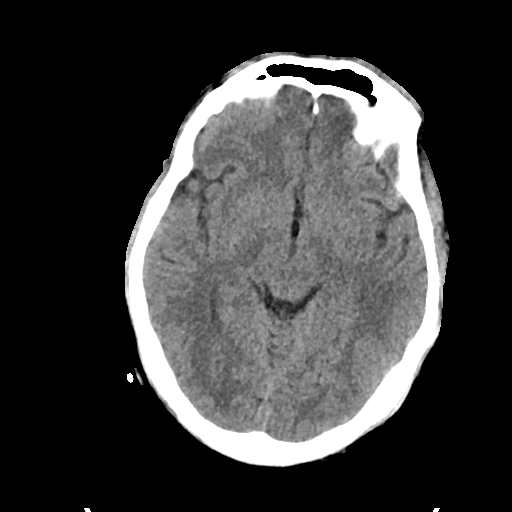
[im 17/33  brain]
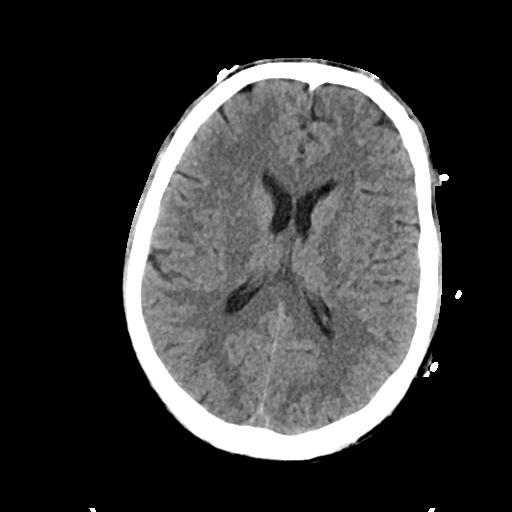
[im 21/33  brain]
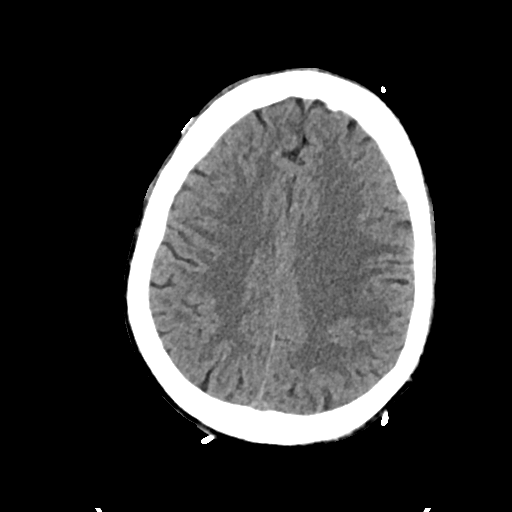
[im 21/33  bone]
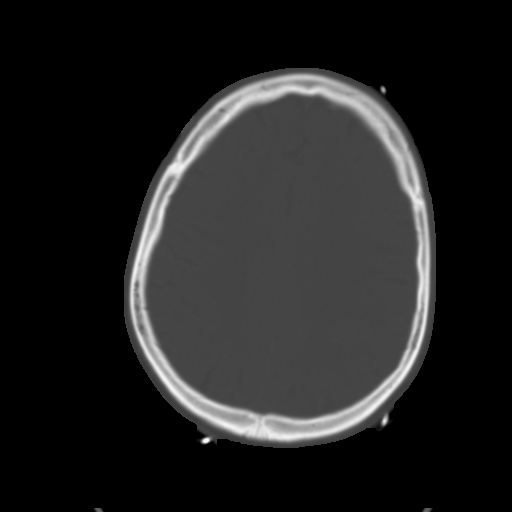
[im 25/33  brain]
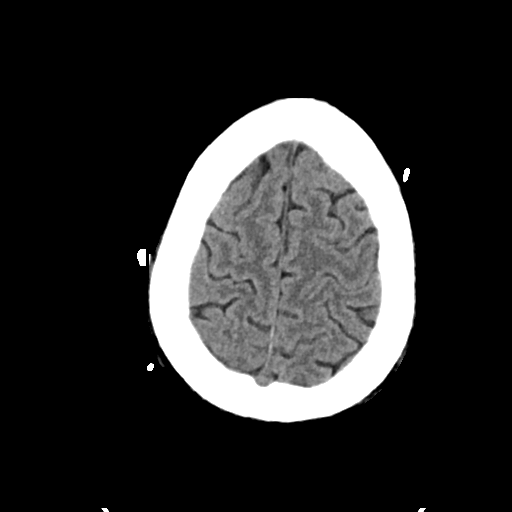
[im 29/33  brain]
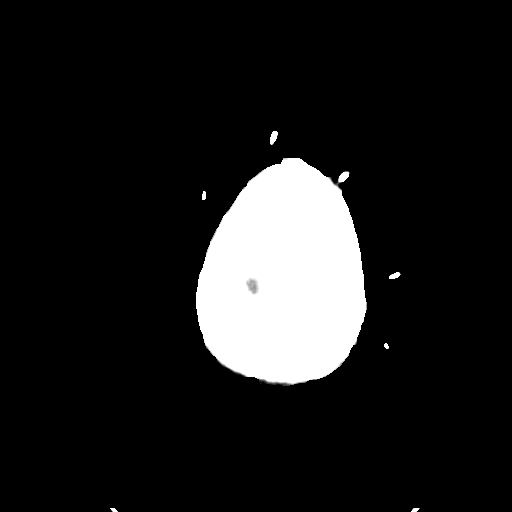

[Series 5: cor soft · coronal · 0.31mm/px · 3 of 77 slices shown]
[im 26/77  brain]
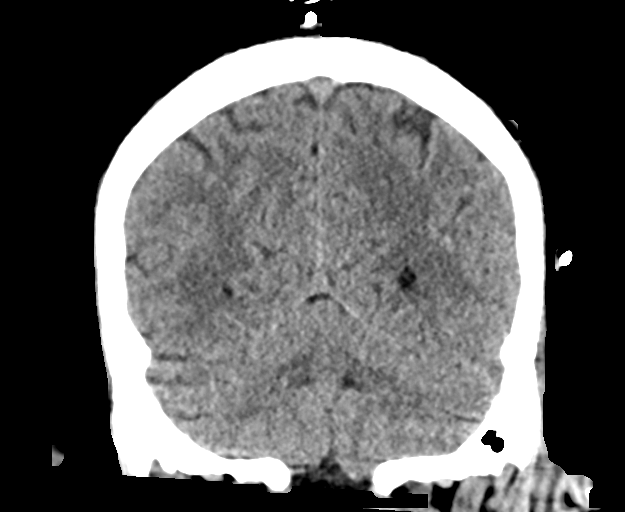
[im 34/77  brain]
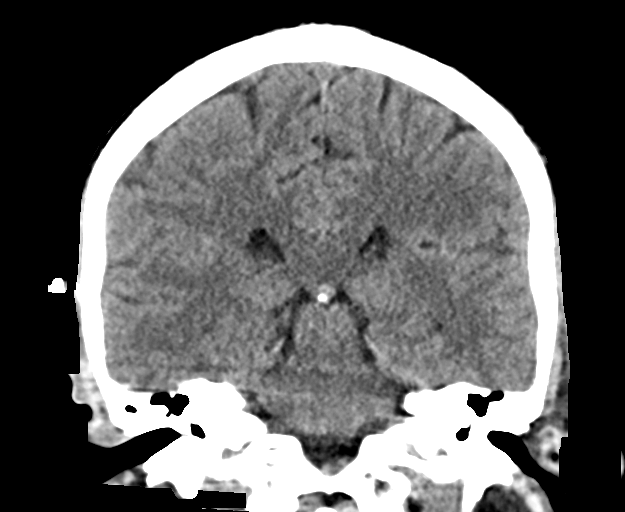
[im 43/77  brain]
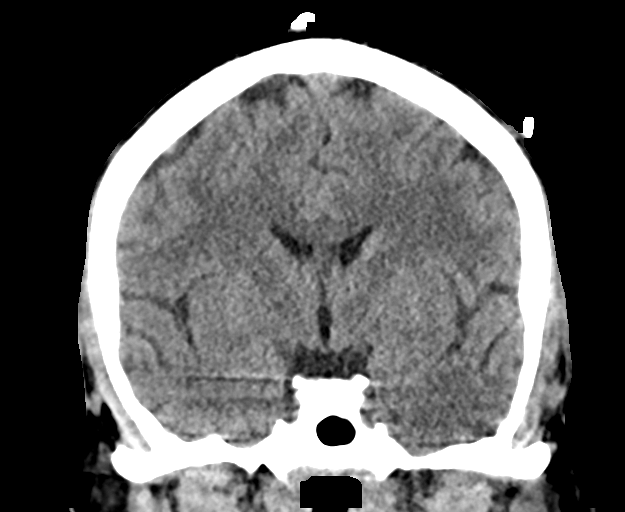

[Series 6: sag soft · sagittal · 0.31mm/px · 3 of 63 slices shown]
[im 21/63  brain]
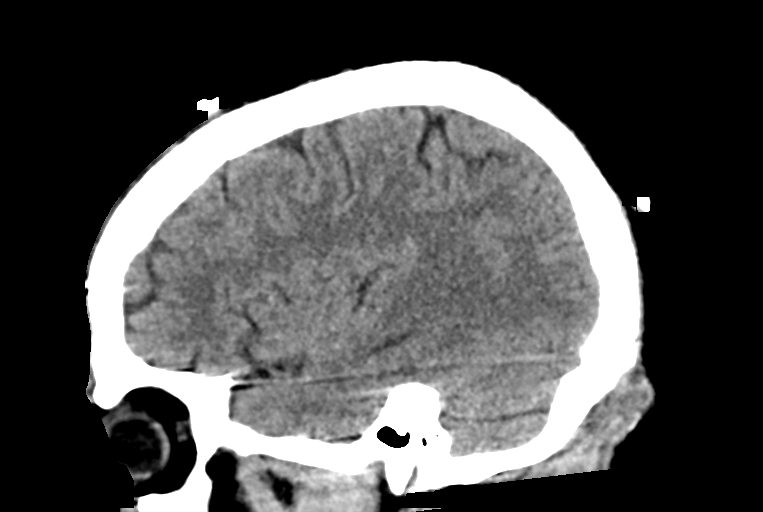
[im 32/63  brain]
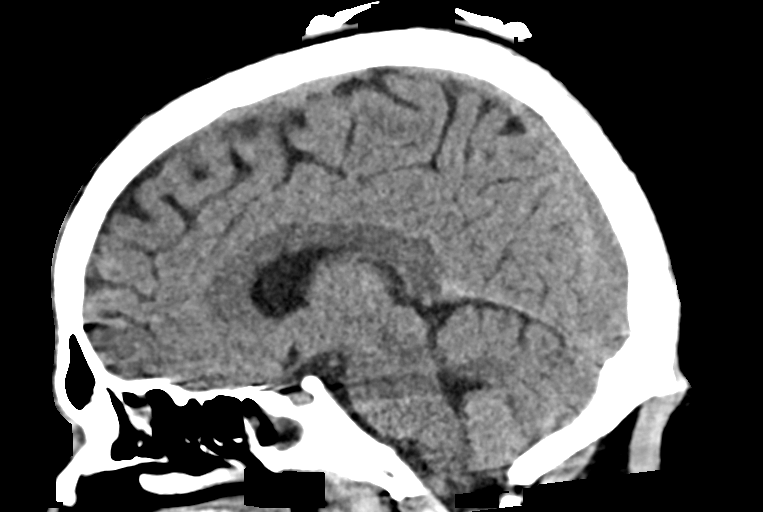
[im 42/63  brain]
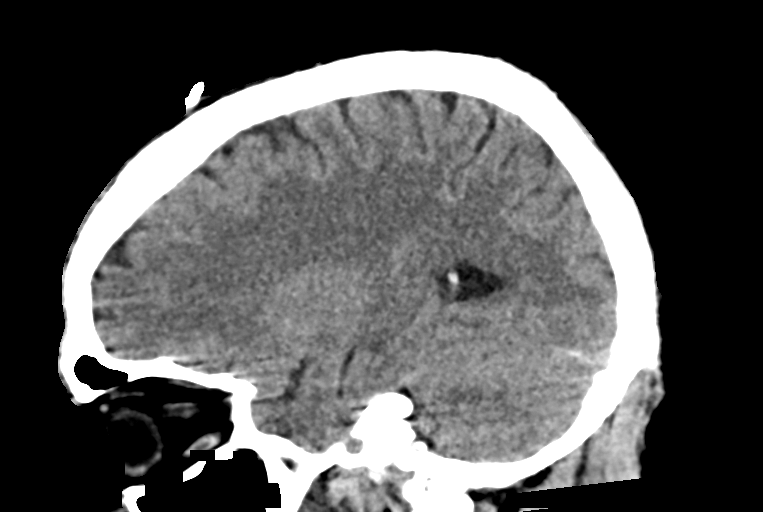

[16 of 47 positions shown; findings below may reference images not displayed]

FINDINGS: Brain: No evidence of acute infarction, hemorrhage, hydrocephalus,
extra-axial collection or mass lesion/mass effect. Patchy areas of
restricted diffusion and perirolandic region bilaterally on MRI not
visualized by CT.

Vascular: Negative for hyperdense vessel

Skull: Negative

Sinuses/Orbits: Mucosal edema paranasal sinuses.  Negative orbit

Other: None
IMPRESSION: Negative CT head

## 2022-09-25 IMAGING — DX DG CHEST 1V PORT
1 series · 1 of 1 positions shown · non-contrast
Comparison: November 15, 2020

CLINICAL DATA: Respiratory dependent. Pulmonary vascular
congestion.

EXAM:
PORTABLE CHEST 1 VIEW

[chest ap]
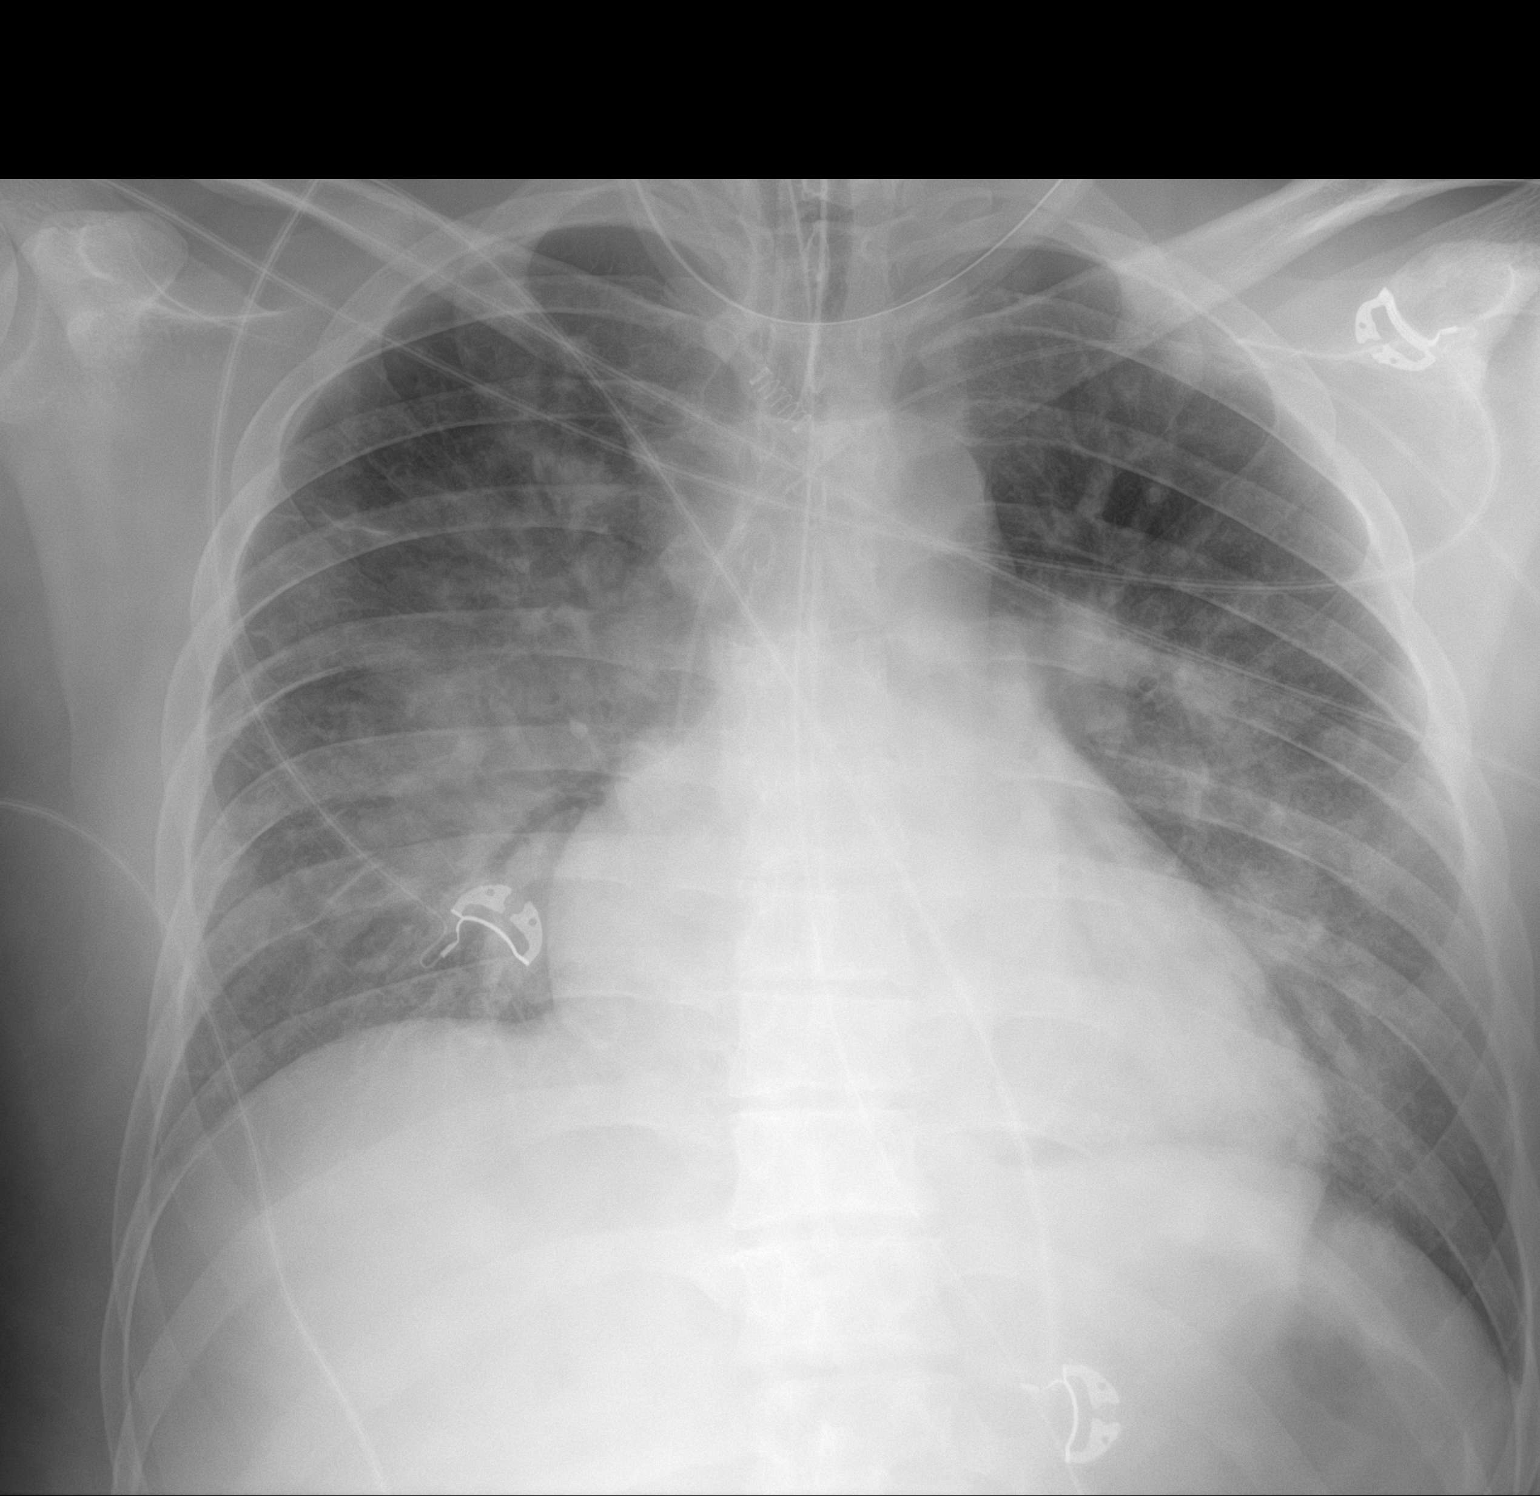

[1 of 1 positions shown; findings below may reference images not displayed]

FINDINGS: The ETT is in good position. The left PICC line is been reposition
now terminating in the central SVC. The NG tube terminates below
today's film. No pneumothorax. Central bilateral pulmonary opacities
are identified, most focal in the perihilar regions. Stable
cardiomegaly. The hila and mediastinum are unchanged. No other
abnormalities.
IMPRESSION: 1. Central pulmonary opacities may represent pulmonary edema.
Multifocal infection not excluded. Recommend clinical correlation.
2. Support apparatus as above.

## 2022-11-22 ENCOUNTER — Emergency Department
Admission: EM | Admit: 2022-11-22 | Discharge: 2022-11-22 | Disposition: A | Discharge status: Home / Self Care | Payer: BLUE CROSS/BLUE SHIELD | Attending: Emergency Medicine | Admitting: Emergency Medicine

## 2022-11-22 ENCOUNTER — Emergency Department: Payer: BLUE CROSS/BLUE SHIELD

## 2022-11-22 ENCOUNTER — Other Ambulatory Visit: Payer: Self-pay

## 2022-11-22 DIAGNOSIS — Z20822 Contact with and (suspected) exposure to covid-19: Secondary | ICD-10-CM | POA: Diagnosis not present

## 2022-11-22 DIAGNOSIS — Z7901 Long term (current) use of anticoagulants: Secondary | ICD-10-CM | POA: Insufficient documentation

## 2022-11-22 DIAGNOSIS — R059 Cough, unspecified: Secondary | ICD-10-CM | POA: Diagnosis present

## 2022-11-22 DIAGNOSIS — J40 Bronchitis, not specified as acute or chronic: Secondary | ICD-10-CM | POA: Diagnosis not present

## 2022-11-22 LAB — SARS CORONAVIRUS 2 BY RT PCR: SARS Coronavirus 2 by RT PCR: NEGATIVE

## 2022-11-22 MED ORDER — AZITHROMYCIN 250 MG PO TABS: ORAL_TABLET | ORAL | 0 refills | Status: AC

## 2022-11-22 NOTE — Discharge Instructions (Addendum)
Take antibiotic as directed.  Take OTC cough medicine for additional symptom relief.  Follow-up with the [facility] department or local community clinic as needed.

## 2022-11-22 NOTE — ED Provider Notes (Signed)
[facility] Emergency Department Provider Note     Event Date/Time   First MD Initiated Contact with Patient 11/22/22 1729     (approximate)   History   Cough   HPI  Nathaniel Lynn is a 32 y.o. male with a history of IV drug use and DVT on Xarelto, presents to the ED for evaluation of 3 days of productive cough.  Also endorsed low-grade fevers noted this morning.  No other reports of any complaints including chest pain or shortness of breath.  Physical Exam   Triage Vital Signs: ED Triage Vitals [11/22/22 1719]  Enc Vitals Group     BP 113/72     Pulse Rate 100     Resp 16     Temp 98.4 F (36.9 C)     Temp Source Oral     SpO2 98 %     Weight 190 lb (86.2 kg)     Height 6' (1.829 m)     Head Circumference      Peak Flow      Pain Score 0     Pain Loc      Pain Edu?      Excl. in GC?     Most recent vital signs: Vitals:   11/22/22 1719  BP: 113/72  Pulse: 100  Resp: 16  Temp: 98.4 F (36.9 C)  SpO2: 98%    General Awake, no distress. NAD HEENT NCAT. PERRL. EOMI. No rhinorrhea. Mucous membranes are moist. CV:  Good peripheral perfusion.  RESP:  Normal effort. CTA ABD:  No distention.   ED Results / Procedures / Treatments   Labs (all labs ordered are listed, but only abnormal results are displayed) Labs Reviewed  SARS CORONAVIRUS 2 BY RT PCR     EKG  RADIOLOGY  I personally viewed and evaluated these images as part of my medical decision making, as well as reviewing the written report by the radiologist.  ED Provider Interpretation: No acute findings  DG Chest 1 View  Result Date: 11/22/2022 CLINICAL DATA:  Cough for 3 days. Fever this morning. EXAM: CHEST  1 VIEW COMPARISON:  Chest radiograph 02/15/2021 FINDINGS: The cardiomediastinal contours are normal. The lungs are clear. Pulmonary vasculature is normal. No consolidation, pleural effusion, or pneumothorax. No acute osseous abnormalities are seen. IMPRESSION:  Negative AP view of the chest. Electronically Signed   By: Narda Rutherford M.D.   On: 11/22/2022 17:59     PROCEDURES:  Critical Care performed: No  Procedures   MEDICATIONS ORDERED IN ED: Medications - No data to display   IMPRESSION / MDM / ASSESSMENT AND PLAN / ED COURSE  I reviewed the triage vital signs and the nursing notes.                              Differential diagnosis includes, but is not limited to, COVID, CAP, bronchitis, viral URI, AOM, sinusitis  Patient's presentation is most consistent with acute complicated illness / injury requiring diagnostic workup.  Patient's diagnosis is consistent with acute bronchitis.  Patient with reassuring exam overall.  No signs of any acute respiratory distress, normal vital signs, or concern for DVT/PE.  Patient with a chest x-ray does not reveal any acute intrathoracic process.  Patient will be discharged home with prescriptions for Zithromycin. Patient is to follow up with his primary provider or local urgent care as needed or otherwise directed. Patient  is given ED precautions to return to the ED for any worsening or new symptoms.   FINAL CLINICAL IMPRESSION(S) / ED DIAGNOSES   Final diagnoses:  Bronchitis     Rx / DC Orders   ED Discharge Orders          Ordered    azithromycin (ZITHROMAX Z-PAK) 250 MG tablet        11/22/22 1804             Note:  This document was prepared using Dragon voice recognition software and may include unintentional dictation errors.    Lissa Hoard, PA-C 11/22/22 1827    Jene Every, MD 11/22/22 [phone]

## 2022-11-22 NOTE — ED Triage Notes (Signed)
Pt to ED from home for cough x 3 days with green mucus production and low grade fever this morning. Pt is CAOx4, in no acute distress and ambulatory in triage.

## 2022-12-23 IMAGING — DX DG CHEST 1V PORT
1 series · 1 of 1 positions shown · non-contrast
Comparison: 11/18/2020

CLINICAL DATA: Overdose

EXAM:
PORTABLE CHEST 1 VIEW

[chest ap]
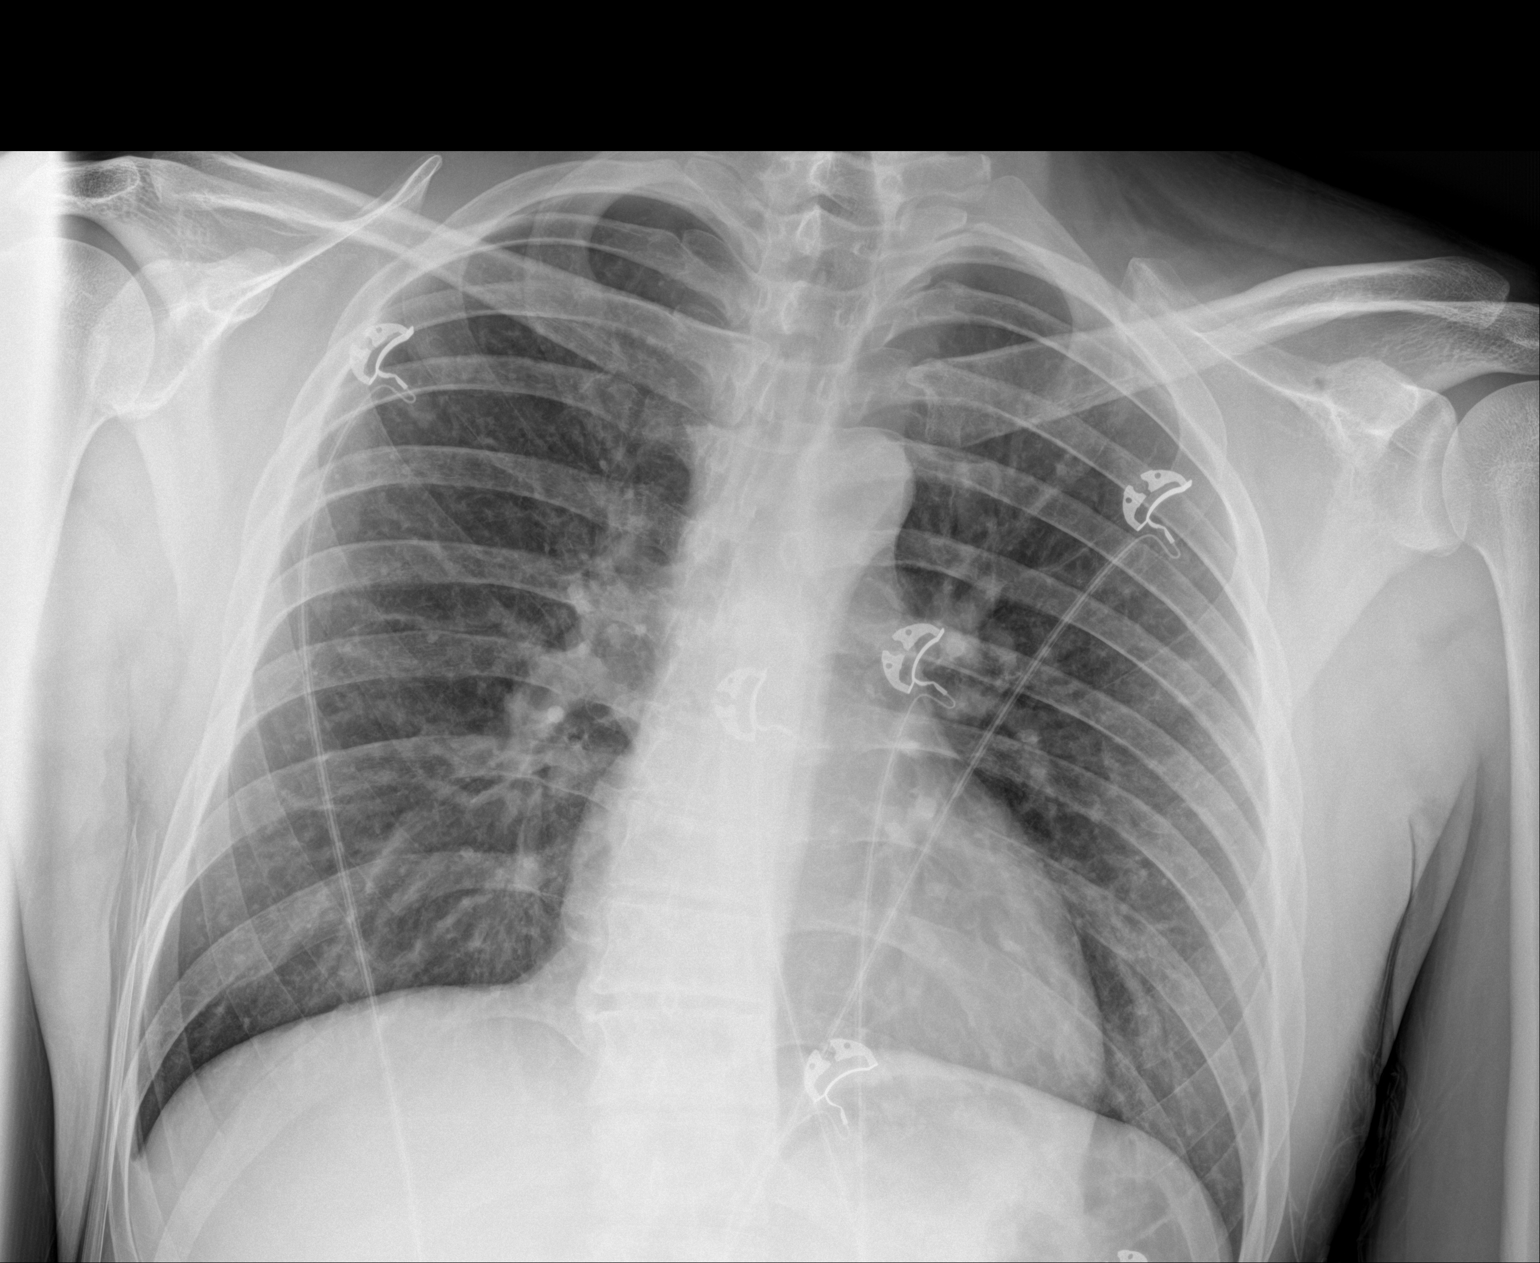

[1 of 1 positions shown; findings below may reference images not displayed]

FINDINGS: Normal heart size and mediastinal contours. No acute infiltrate or
edema. No effusion or pneumothorax. No acute osseous findings.
IMPRESSION: Negative chest.
